# Patient Record
Sex: Female | Born: 1955
Health system: Southern US, Community
[De-identification: ages and names within clinical notes are randomized; demographics above are authoritative.]

## PROBLEM LIST (undated history)

## (undated) DIAGNOSIS — M51369 Other intervertebral disc degeneration, lumbar region without mention of lumbar back pain or lower extremity pain: Secondary | ICD-10-CM

## (undated) DIAGNOSIS — N809 Endometriosis, unspecified: Secondary | ICD-10-CM

## (undated) DIAGNOSIS — H9209 Otalgia, unspecified ear: Secondary | ICD-10-CM

## (undated) DIAGNOSIS — I493 Ventricular premature depolarization: Secondary | ICD-10-CM

## (undated) DIAGNOSIS — E119 Type 2 diabetes mellitus without complications: Secondary | ICD-10-CM

## (undated) DIAGNOSIS — R0989 Other specified symptoms and signs involving the circulatory and respiratory systems: Secondary | ICD-10-CM

## (undated) DIAGNOSIS — I10 Essential (primary) hypertension: Secondary | ICD-10-CM

## (undated) DIAGNOSIS — F419 Anxiety disorder, unspecified: Secondary | ICD-10-CM

## (undated) DIAGNOSIS — R002 Palpitations: Secondary | ICD-10-CM

## (undated) DIAGNOSIS — M5136 Other intervertebral disc degeneration, lumbar region: Secondary | ICD-10-CM

## (undated) HISTORY — PX: TUBAL LIGATION: SHX77

## (undated) HISTORY — PX: BREAST SURGERY: SHX581

## (undated) HISTORY — DX: Other specified symptoms and signs involving the circulatory and respiratory systems: R09.89

## (undated) HISTORY — DX: Endometriosis, unspecified: N80.9

## (undated) HISTORY — DX: Anxiety disorder, unspecified: F41.9

## (undated) HISTORY — DX: Ventricular premature depolarization: I49.3

## (undated) HISTORY — DX: Other intervertebral disc degeneration, lumbar region without mention of lumbar back pain or lower extremity pain: M51.369

## (undated) HISTORY — DX: Other intervertebral disc degeneration, lumbar region: M51.36

## (undated) HISTORY — PX: TOTAL KNEE ARTHROPLASTY: SHX125

## (undated) HISTORY — PX: REPLACEMENT TOTAL KNEE: SUR1224

## (undated) HISTORY — DX: Otalgia, unspecified ear: H92.09

## (undated) HISTORY — DX: Type 2 diabetes mellitus without complications: E11.9

## (undated) HISTORY — DX: Palpitations: R00.2

## (undated) HISTORY — DX: Essential (primary) hypertension: I10

---

## 2001-07-04 ENCOUNTER — Ambulatory Visit (HOSPITAL_COMMUNITY): Admission: RE | Admit: 2001-07-04 | Discharge: 2001-07-04 | Payer: Self-pay | Admitting: Family Medicine

## 2001-07-04 ENCOUNTER — Encounter: Payer: Self-pay | Admitting: Family Medicine

## 2001-07-14 ENCOUNTER — Ambulatory Visit (HOSPITAL_COMMUNITY): Admission: RE | Admit: 2001-07-14 | Discharge: 2001-07-14 | Payer: Self-pay | Admitting: Obstetrics and Gynecology

## 2001-07-14 ENCOUNTER — Encounter: Payer: Self-pay | Admitting: Obstetrics and Gynecology

## 2002-02-24 ENCOUNTER — Other Ambulatory Visit: Admission: RE | Admit: 2002-02-24 | Discharge: 2002-02-24 | Payer: Self-pay | Admitting: Obstetrics and Gynecology

## 2002-05-26 ENCOUNTER — Encounter: Payer: Self-pay | Admitting: Family Medicine

## 2002-05-26 ENCOUNTER — Ambulatory Visit (HOSPITAL_COMMUNITY): Admission: RE | Admit: 2002-05-26 | Discharge: 2002-05-26 | Payer: Self-pay | Admitting: Family Medicine

## 2002-07-05 ENCOUNTER — Ambulatory Visit (HOSPITAL_COMMUNITY): Admission: RE | Admit: 2002-07-05 | Discharge: 2002-07-05 | Payer: Self-pay | Admitting: Family Medicine

## 2002-07-05 ENCOUNTER — Encounter: Payer: Self-pay | Admitting: Family Medicine

## 2002-08-04 ENCOUNTER — Emergency Department (HOSPITAL_COMMUNITY): Admission: EM | Admit: 2002-08-04 | Discharge: 2002-08-04 | Payer: Self-pay | Admitting: Emergency Medicine

## 2003-02-01 ENCOUNTER — Emergency Department (HOSPITAL_COMMUNITY): Admission: EM | Admit: 2003-02-01 | Discharge: 2003-02-01 | Payer: Self-pay | Admitting: Internal Medicine

## 2003-03-13 ENCOUNTER — Emergency Department (HOSPITAL_COMMUNITY): Admission: EM | Admit: 2003-03-13 | Discharge: 2003-03-13 | Payer: Self-pay

## 2003-07-09 ENCOUNTER — Encounter: Payer: Self-pay | Admitting: Obstetrics and Gynecology

## 2003-07-09 ENCOUNTER — Ambulatory Visit (HOSPITAL_COMMUNITY): Admission: RE | Admit: 2003-07-09 | Discharge: 2003-07-09 | Payer: Self-pay | Admitting: Obstetrics and Gynecology

## 2004-05-29 ENCOUNTER — Ambulatory Visit (HOSPITAL_COMMUNITY): Admission: RE | Admit: 2004-05-29 | Discharge: 2004-05-29 | Payer: Self-pay | Admitting: Family Medicine

## 2004-12-03 ENCOUNTER — Ambulatory Visit (HOSPITAL_COMMUNITY): Admission: RE | Admit: 2004-12-03 | Discharge: 2004-12-03 | Payer: Self-pay | Admitting: Family Medicine

## 2005-05-17 ENCOUNTER — Emergency Department (HOSPITAL_COMMUNITY): Admission: EM | Admit: 2005-05-17 | Discharge: 2005-05-17 | Payer: Self-pay | Admitting: Emergency Medicine

## 2005-06-01 ENCOUNTER — Ambulatory Visit: Payer: Self-pay | Admitting: Orthopedic Surgery

## 2005-08-31 ENCOUNTER — Ambulatory Visit: Payer: Self-pay | Admitting: Orthopedic Surgery

## 2005-11-04 ENCOUNTER — Ambulatory Visit: Payer: Self-pay | Admitting: Orthopedic Surgery

## 2005-12-21 HISTORY — PX: CYST EXCISION: SHX5701

## 2006-01-18 ENCOUNTER — Ambulatory Visit: Payer: Self-pay | Admitting: Orthopedic Surgery

## 2006-01-25 ENCOUNTER — Ambulatory Visit (HOSPITAL_COMMUNITY): Admission: RE | Admit: 2006-01-25 | Discharge: 2006-01-25 | Payer: Self-pay | Admitting: Family Medicine

## 2006-01-28 ENCOUNTER — Ambulatory Visit: Payer: Self-pay | Admitting: Orthopedic Surgery

## 2006-02-19 ENCOUNTER — Encounter: Admission: RE | Admit: 2006-02-19 | Discharge: 2006-02-19 | Payer: Self-pay | Admitting: Orthopedic Surgery

## 2006-03-09 ENCOUNTER — Encounter: Admission: RE | Admit: 2006-03-09 | Discharge: 2006-03-09 | Payer: Self-pay | Admitting: Orthopedic Surgery

## 2006-06-17 ENCOUNTER — Encounter: Admission: RE | Admit: 2006-06-17 | Discharge: 2006-06-17 | Payer: Self-pay | Admitting: Orthopedic Surgery

## 2006-10-14 ENCOUNTER — Ambulatory Visit: Payer: Self-pay | Admitting: Orthopedic Surgery

## 2006-10-19 ENCOUNTER — Encounter (HOSPITAL_COMMUNITY): Admission: RE | Admit: 2006-10-19 | Discharge: 2006-11-18 | Payer: Self-pay | Admitting: Orthopedic Surgery

## 2006-10-27 ENCOUNTER — Ambulatory Visit: Payer: Self-pay | Admitting: Orthopedic Surgery

## 2006-11-19 ENCOUNTER — Encounter (HOSPITAL_COMMUNITY): Admission: RE | Admit: 2006-11-19 | Discharge: 2006-12-19 | Payer: Self-pay | Admitting: Orthopedic Surgery

## 2006-12-01 ENCOUNTER — Ambulatory Visit (HOSPITAL_COMMUNITY): Admission: RE | Admit: 2006-12-01 | Discharge: 2006-12-01 | Payer: Self-pay | Admitting: General Surgery

## 2006-12-01 ENCOUNTER — Encounter (INDEPENDENT_AMBULATORY_CARE_PROVIDER_SITE_OTHER): Payer: Self-pay | Admitting: *Deleted

## 2006-12-27 ENCOUNTER — Ambulatory Visit: Payer: Self-pay | Admitting: Orthopedic Surgery

## 2007-01-28 ENCOUNTER — Encounter: Admission: RE | Admit: 2007-01-28 | Discharge: 2007-01-28 | Payer: Self-pay | Admitting: Neurosurgery

## 2007-02-21 ENCOUNTER — Ambulatory Visit (HOSPITAL_COMMUNITY): Admission: RE | Admit: 2007-02-21 | Discharge: 2007-02-21 | Payer: Self-pay | Admitting: Family Medicine

## 2007-02-28 ENCOUNTER — Ambulatory Visit: Payer: Self-pay | Admitting: Orthopedic Surgery

## 2007-12-22 HISTORY — PX: COLONOSCOPY: SHX174

## 2007-12-22 HISTORY — PX: LUMBAR LAMINECTOMY: SHX95

## 2008-02-16 ENCOUNTER — Other Ambulatory Visit: Admission: RE | Admit: 2008-02-16 | Discharge: 2008-02-16 | Payer: Self-pay | Admitting: Obstetrics and Gynecology

## 2008-02-29 ENCOUNTER — Ambulatory Visit (HOSPITAL_COMMUNITY): Admission: RE | Admit: 2008-02-29 | Discharge: 2008-02-29 | Payer: Self-pay | Admitting: Family Medicine

## 2008-03-09 ENCOUNTER — Ambulatory Visit (HOSPITAL_COMMUNITY): Admission: RE | Admit: 2008-03-09 | Discharge: 2008-03-09 | Payer: Self-pay | Admitting: Family Medicine

## 2008-03-13 HISTORY — PX: CARDIOVASCULAR STRESS TEST: SHX262

## 2008-03-13 HISTORY — PX: DOPPLER ECHOCARDIOGRAPHY: SHX263

## 2008-03-22 ENCOUNTER — Ambulatory Visit (HOSPITAL_COMMUNITY): Admission: RE | Admit: 2008-03-22 | Discharge: 2008-03-22 | Payer: Self-pay | Admitting: Gastroenterology

## 2008-03-22 ENCOUNTER — Ambulatory Visit: Payer: Self-pay | Admitting: Gastroenterology

## 2008-04-25 ENCOUNTER — Encounter: Admission: RE | Admit: 2008-04-25 | Discharge: 2008-04-25 | Payer: Self-pay | Admitting: Neurosurgery

## 2008-04-30 ENCOUNTER — Encounter: Payer: Self-pay | Admitting: Orthopedic Surgery

## 2008-07-27 ENCOUNTER — Inpatient Hospital Stay (HOSPITAL_COMMUNITY): Admission: RE | Admit: 2008-07-27 | Discharge: 2008-07-31 | Payer: Self-pay | Admitting: Neurosurgery

## 2008-08-03 ENCOUNTER — Encounter: Payer: Self-pay | Admitting: Orthopedic Surgery

## 2008-12-21 HISTORY — PX: LAPAROSCOPIC CHOLECYSTECTOMY: SUR755

## 2009-08-10 ENCOUNTER — Emergency Department (HOSPITAL_COMMUNITY): Admission: EM | Admit: 2009-08-10 | Discharge: 2009-08-10 | Payer: Self-pay | Admitting: Emergency Medicine

## 2009-10-29 HISTORY — PX: OTHER SURGICAL HISTORY: SHX169

## 2009-11-07 ENCOUNTER — Emergency Department (HOSPITAL_COMMUNITY): Admission: EM | Admit: 2009-11-07 | Discharge: 2009-11-07 | Payer: Self-pay | Admitting: Emergency Medicine

## 2009-11-07 ENCOUNTER — Ambulatory Visit (HOSPITAL_COMMUNITY): Admission: RE | Admit: 2009-11-07 | Discharge: 2009-11-07 | Payer: Self-pay | Admitting: Emergency Medicine

## 2009-11-25 ENCOUNTER — Ambulatory Visit (HOSPITAL_COMMUNITY): Admission: RE | Admit: 2009-11-25 | Discharge: 2009-11-25 | Payer: Self-pay | Admitting: General Surgery

## 2010-07-28 ENCOUNTER — Other Ambulatory Visit: Admission: RE | Admit: 2010-07-28 | Discharge: 2010-07-28 | Payer: Self-pay | Admitting: Obstetrics & Gynecology

## 2010-07-29 ENCOUNTER — Ambulatory Visit (HOSPITAL_COMMUNITY): Admission: RE | Admit: 2010-07-29 | Discharge: 2010-07-29 | Payer: Self-pay | Admitting: Obstetrics & Gynecology

## 2010-10-04 DIAGNOSIS — R002 Palpitations: Secondary | ICD-10-CM

## 2010-10-04 HISTORY — DX: Palpitations: R00.2

## 2011-03-24 ENCOUNTER — Other Ambulatory Visit: Payer: Self-pay | Admitting: Obstetrics & Gynecology

## 2011-03-25 LAB — COMPREHENSIVE METABOLIC PANEL
ALT: 18 U/L (ref 0–35)
AST: 29 U/L (ref 0–37)
Albumin: 3.8 g/dL (ref 3.5–5.2)
CO2: 32 mEq/L (ref 19–32)
Creatinine, Ser: 0.88 mg/dL (ref 0.4–1.2)
Sodium: 141 mEq/L (ref 135–145)
Total Bilirubin: 0.4 mg/dL (ref 0.3–1.2)
Total Protein: 8 g/dL (ref 6.0–8.3)

## 2011-03-25 LAB — CBC
Hemoglobin: 11.8 g/dL — ABNORMAL LOW (ref 12.0–15.0)
MCV: 81.8 fL (ref 78.0–100.0)
Platelets: 304 10*3/uL (ref 150–400)
RBC: 4.44 MIL/uL (ref 3.87–5.11)
WBC: 7.7 10*3/uL (ref 4.0–10.5)

## 2011-03-25 LAB — DIFFERENTIAL
Basophils Relative: 1 % (ref 0–1)
Eosinophils Absolute: 0.2 10*3/uL (ref 0.0–0.7)
Monocytes Absolute: 0.5 10*3/uL (ref 0.1–1.0)

## 2011-03-25 LAB — LIPASE, BLOOD: Lipase: 14 U/L (ref 11–59)

## 2011-05-05 NOTE — Op Note (Signed)
Megan Oneal, Megan Oneal                  ACCOUNT NO.:  [id number]   MEDICAL RECORD NO.:  [id number]          PATIENT TYPE:  AMB   LOCATION:  DAY                           FACILITY:  [facility]   PHYSICIAN:  Kassie Mends, M.D.      DATE OF BIRTH:  12/03/1956   DATE OF PROCEDURE:  03/22/2008  DATE OF DISCHARGE:                               OPERATIVE REPORT   PROCEDURE:  Colonoscopy.   INDICATION FOR EXAM:  Megan Oneal is a 55 year old female who presents for  average risk colon cancer screening.   FINDINGS:  Small internal hemorrhoids.  Otherwise no polyps, masses,  inflammatory changes, diverticula, or AVMs.   RECOMMENDATIONS:  1. She should follow high fiber diet. She is given a handout on high-      fiber diet and hemorrhoids.  2. Screening colonoscopy in 10 years.   MEDICATIONS:  1. Demerol 100 mg IV.  2. Versed 6 mg IV.   PROCEDURE TECHNIQUE:  Physical exam was performed.  An informed consent  was obtained from the patient after explaining the benefits, risks, and  alternatives to the procedure.  The patient was connected to the monitor  and placed in the left lateral position.  Continuous oxygen was provided  by nasal cannula, IV medicine administered through an indwelling  cannula.  After administration of sedation and rectal exam, the  patient's rectum was intubated; and the scope was advanced under direct  visualization to the cecum.  The scope was removed slowly by carefully  examining the color, texture, anatomy, and integrity of the mucosa on  the way out.  The patient was recovered in endoscopy, discharged home in  satisfactory condition.      Kassie Mends, M.D.  Electronically Signed     SM/MEDQ  D:  03/22/2008  T:  03/22/2008  Job:  [id number]   cc:   Lazaro Arms, M.D.  Fax: [phone]

## 2011-05-05 NOTE — Op Note (Signed)
NAMEJALIANA, Megan Oneal                  ACCOUNT NO.:  [id number]   MEDICAL RECORD NO.:  [id number]          PATIENT TYPE:  INP   LOCATION:  3002                         FACILITY:  [facility]   PHYSICIAN:  Danae Orleans. Venetia Maxon, M.D.  DATE OF BIRTH:  Jan 21, 1956   DATE OF PROCEDURE:  07/27/2008  DATE OF DISCHARGE:                               OPERATIVE REPORT   PREOPERATIVE DIAGNOSES:  Herniated lumbar disk with spondylolisthesis,  degenerative disk disease, stenosis, and lumbar radiculopathy at L4-L5  and L5-S1 levels.   POSTOPERATIVE DIAGNOSES:  Herniated lumbar disk with spondylolisthesis,  degenerative disk disease, stenosis, and lumbar radiculopathy at L4-L5  and L5-S1 levels.   PROCEDURES:  L4-L5 and L5-S1 laminectomy with diskectomy; transforaminal  lumbar interbody fusion with PEEK interbody cages, BMP morselized, bone  autograft, and Fortoss; pedicle screw fixation L4 through S1 levels  bilaterally; and posterolateral arthrodesis.   SURGEON:  Danae Orleans. Venetia Maxon, MD   ASSISTANT:  1. Georgiann Cocker, RN  2. Payton Doughty, MD   ANESTHESIA:  General endotracheal anesthesia.   ESTIMATED BLOOD LOSS:  200 mL.   COMPLICATIONS:  None.   DISPOSITION:  Recovery.   INDICATIONS:  Megan Oneal is a 55 year old morbidly obese woman with a  mobile spondylolisthesis of L4 and L5 with a herniated disk at L4-L5  eccentric to the left and with significant disk degeneration at L5-S1.  It was elected to take her to surgery for decompression and fusion at  these affected levels.   PROCEDURE:  Megan Oneal was brought to the operating room.  Following  satisfactory uncomplicated induction of general endotracheal anesthesia  and placement of intravenous lines and Foley catheter, the patient was  placed in prone position on the [place] table.  Soft tissue and bony  prominences were padded appropriately.  Her low back was prepped and  draped in the usual sterile fashion.  The area of planned incision was  infiltrated with 0.25% Marcaine and 0.5% lidocaine with 1:100,000  epinephrine.  Incision was made in the midline and carried through  approximately 6 inches of adipose tissue.  The lumbodorsal fascia was  incised bilaterally.  Subperiosteal dissection was performed exposing  the L4 and L5 transverse processes bilaterally and sacral alae.  Self-  retaining retractors were placed and the intraoperative x-ray confirmed  correct orientation.  Subsequently, hemilaminectomy of L4 and of L5 was  performed using a high-speed drill and Kerrison rongeurs on the left  side of midline.  The facet joints were markedly incompetent at L4-L5.  Laminar spreader was placed at L4-L5 on the right.  The interspace was  incised with a #15 blade after protecting the neural elements with  D'Errico nerve root retractor and endplates were stripped of residual  disk material using variety of endplate preparation and curettes, and  after trial sizing, it was elected to use a 9-mm banana-shaped TLIF  cage.  This was packed with portion of a small kit of BMP along with  Fortoss.  Additional BMP and Fortoss were placed deep to the cage with  bone autograft.  The cage was then  inserted and countersunk  appropriately.  Additional bone autograft was placed overlying this cage  and tamped into position.  A similar decompression was performed at the  L5-S1 level.  This was markedly degenerated.  The disk was highly  degenerated.  The endplates were stripped of residual disk material, and  after trial sizing, it was elected to use a 7-mm TLIF cage.  This was  inserted and tamped into position after packing the interspace with BMP  and bone autograft and Fortoss, and additionally, cage had been packed  with Fortoss and BMP.  We attempted to rotate the cage into a lateral  position, but we would not move into this position; however, that it was  felt to be well positioned in its good alignment with significant  restoration of  disk space height at this level.  Subsequently, pedicle  screws were then placed bilaterally using 45 x 6.5 mm sacral screws, 45  x 6.5 mm L5 screws, and 50 x 6.5 mm screws at L4.  All screws had  excellent purchase.  The positioning was confirmed on AP and lateral  fluoroscopy.  No evidence of any bony cutouts.  A 60-mm preloaded rods  were then placed and locked down in situ.  In the left posterolateral  region, remaining BMP, bone autograft, and a 10 mL of Actifuse were  placed going through remaining Fortoss and this was taped into position.  After screws were locked down, the fascia was closed with one Vicryl  sutures, subcutaneous tissues were approximated with 2-0 Vicryl  interrupted inverted sutures, and skin tissues were approximated with  interrupted 3-0 Vicryl subcutaneous stitch.  Wound was dressed with  Benzoin, Steri-Strips, Telfa gauze, and tape.  The patient was extubated  in the operating room and taken to the recovery in stable satisfactory  condition having tolerated the procedure well.  Counts were correct at  the end of the case.      Danae Orleans. Venetia Maxon, M.D.  Electronically Signed     JDS/MEDQ  D:  07/27/2008  T:  07/28/2008  Job:  [id number]

## 2011-05-08 NOTE — Op Note (Signed)
Megan Oneal, Megan Oneal                  ACCOUNT NO.:  [id number]   MEDICAL RECORD NO.:  [id number]          PATIENT TYPE:  AMB   LOCATION:  DAY                           FACILITY:  [facility]   PHYSICIAN:  Dalia Heading, M.D.  DATE OF BIRTH:  July 19, 1956   DATE OF PROCEDURE:  12/01/2006  DATE OF DISCHARGE:                               OPERATIVE REPORT   PREOPERATIVE DIAGNOSIS:  Follicular cyst, scalp.   POSTOPERATIVE DIAGNOSIS:  Follicular cyst, scalp.   PROCEDURE:  Excision of 1.5 cm cyst, scalp.   SURGEON:  Dr. Franky Macho   ANESTHESIA:  MAC.   INDICATIONS:  The patient is a 55 year old black female who presents  with a recurrent follicular cyst on the right posterior aspect of the  scalp.  The risks and benefits of the procedure were fully explained to  the patient, gave informed consent.   PROCEDURE NOTE:  The patient was placed in left lateral decubitus  position.  A small area at the neck line of the scalp on the right side  was shaved and prepped in the usual sterile technique with Betadine.  Surgical site confirmation was performed.  1% Xylocaine with epinephrine  was used local anesthesia.   An elliptical incision was made around the cyst.  This was taken down to  the subcutaneous tissue and the cyst was excised without difficulty.  Bleeding was controlled using Bovie electrocautery.  The specimen sent  to pathology for further examination.  The skin was reapproximated using  4-0 nylon interrupted sutures.  Neosporin ointment was then applied.   All tape and needle counts correct at the end of procedure.  The patient  was transferred to day surgery in stable condition.  Complications none.   SPECIMEN:  Sebaceous cyst, scalp.   BLOOD LOSS:  None      Dalia Heading, M.D.  Electronically Signed     MAJ/MEDQ  D:  12/01/2006  T:  12/01/2006  Job:  [id number]   cc:   Patrica Duel, M.D.  Fax: [phone]

## 2011-05-08 NOTE — Discharge Summary (Signed)
NAMEJASHAWNA, Megan Oneal                  ACCOUNT NO.:  [id number]   MEDICAL RECORD NO.:  [id number]          PATIENT TYPE:  INP   LOCATION:  3002                         FACILITY:  [facility]   PHYSICIAN:  Danae Orleans. Venetia Maxon, M.D.  DATE OF BIRTH:  December 18, 1956   DATE OF ADMISSION:  07/27/2008  DATE OF DISCHARGE:  07/31/2008                               DISCHARGE SUMMARY   REASON FOR ADMISSION:  1. Lumbar disk displacement.  2. Spondylolisthesis.  3. Lumbar disk degeneration.  4. Spinal stenosis.  5. Lumbosacral neuritis, NOS.  6. Morbid obesity.  7. Hypertension, NOS.  8. Esophageal reflux.  9. Lumbosacral spondylosis.   FINAL DIAGNOSES:  1. Lumbar disk displacement.  2. Spondylolisthesis.  3. Lumbar disk degeneration.  4. Spinal stenosis.  5. Lumbosacral neuritis, not otherwise specified.  6. Morbid obesity.  7. Hypertension, NOS.  8. Esophageal reflux.  9. Lumbosacral spondylosis.   HISTORY OF ILLNESS AND HOSPITAL COURSE:  Megan Oneal is a 55 year old  woman who is admitted to this hospital on the same day of procedure and  underwent decompression and fusion L4 through S1 levels.  Postoperatively, she did well.  It was gradually mobilized into back  brace and had some left leg pain, which subsequently improved.  She was  doing well on August 31, 2008, and discharged home with a rolling  walker.  Instructed to set up for home physical therapy and occupational  therapy with Percocet, Lyrica, and Flexeril.  Instructed to follow up in  the office in 3 weeks for postoperative visit.      Danae Orleans. Venetia Maxon, M.D.  Electronically Signed     JDS/MEDQ  D:  09/13/2008  T:  09/14/2008  Job:  [id number]

## 2011-05-08 NOTE — H&P (Signed)
Megan Oneal, Megan Oneal                  ACCOUNT NO.:  [id number]   MEDICAL RECORD NO.:  [id number]          PATIENT TYPE:  AMB   LOCATION:                                FACILITY:  [facility]   PHYSICIAN:  Dalia Heading, M.D.  DATE OF BIRTH:  [birth year]   DATE OF ADMISSION:  DATE OF DISCHARGE:  LH                              HISTORY & PHYSICAL   CHIEF COMPLAINT:  Follicular cyst, scalp.   HISTORY OF PRESENT ILLNESS:  The patient is a 55 year old black female  who is referred for evaluation and treatment of a cyst on her scalp.  It  has been present for some time, but has increased in size and is causing  her discomfort.  No drainage has been noted.  Antibiotics have not been  helpful.   PAST MEDICAL HISTORY:  Includes hypertension.   PAST SURGICAL HISTORY:  Unremarkable.   CURRENT MEDICATIONS:  1. Diclofenac.  2. Vicodin.  3. Quineretic.  4. Metoprolol.  5. Clindamycin   </ALLERGIES/  SULFA.Marland Kitchen   REVIEW OF SYSTEMS:  Noncontributory.   PHYSICAL EXAMINATION:  GENERAL:  The patient is a well-developed, well-  nourished black female in no acute distress.  HEENT: Examination reveals a 1.5 cm tender sebaceous cyst along the  hairline, right posterior.  LUNGS:  Clear to auscultation with equal breath sounds bilaterally.  HEART:  Examination reveals regular rate and rhythm without S3, S4,  murmurs.   IMPRESSION:  Follicular cyst, scalp.   PLAN:  The patient is scheduled for excision of the cyst, scalp on  December 01, 2006.  The risks and benefits of the procedure including  bleeding, infection and recurrence of the cyst were fully explained to  the patient, who gave informed consent.      Dalia Heading, M.D.  Electronically Signed     MAJ/MEDQ  D:  11/25/2006  T:  11/25/2006  Job:  [id number]   cc:   Patrica Duel, M.D.  Fax: [phone]

## 2011-05-13 ENCOUNTER — Ambulatory Visit (INDEPENDENT_AMBULATORY_CARE_PROVIDER_SITE_OTHER): Payer: Medicare Other | Admitting: Internal Medicine

## 2011-05-13 DIAGNOSIS — K219 Gastro-esophageal reflux disease without esophagitis: Secondary | ICD-10-CM

## 2011-06-01 NOTE — Consult Note (Signed)
NAMEKEWANDA, POLAND                  ACCOUNT NO.:  [id number]  MEDICAL RECORD NO.:  [id number]          PATIENT TYPE:  LOCATION:                                 FACILITY:  PHYSICIAN:  Lionel December, M.D.    DATE OF BIRTH:  1956/01/22  DATE OF CONSULTATION:  05/13/2011 DATE OF DISCHARGE:                                CONSULTATION   REASON FOR CONSULTATION:  GERD.  HISTORY OF PRESENT ILLNESS:  Megan Oneal is a 55 year old female presenting today with complaints of heartburn and sometimes acid will bubble up into her esophagus.  She states when she drinks sodas and spicy foods she will have heartburn.  She did have a prescription for omeprazole, but  says that the medication gave her headache so she stopped it. She is presently taking Zantac on a p.r.n. basis and she says it works.  She says the symptoms became worse after her gallbladder was removed in 2010.  She says she has had been on a GERD diet, but does occasionally eat some spicy foods. She says oatmeal does not bother her.  Acid reflux occurs about every 2- 3 days.  Her appetite is good.  She has one bowel movement a day.  There has been no weight loss.  No dysphagia.  No melena or bright red rectal bleeding.  HOME MEDICATIONS:  Include 1. Anacin p.r.n. 2. Verapamil 180 mg b.i.d. 3. Furosemide 20 mg as needed. 4. Klor-Con 10 mEq as needed. 5. Vitamin D 50,000 units once a week. 6. Tramadol 50 mg 1-2 as needed. 7. Xanax 0.5 as needed. 8. Tums as needed. 9. Zantac 150 mg p.r.n. 10.Fluticasone nasal spray 1 to each nostril daily. 11.Progesterone 1 daily.  ALLERGIES:  She is allergic to SULFA.  PAST SURGICAL HISTORY:  She had a cholecystectomy in 2010 by Dr. Lovell Sheehan for multiple gallstones.  She had back surgery in 2009 in [place]. She had a tubal ligation at age 12.  She had a left breast surgery at age 80 for benign tumor.  She also recently had a uterine biopsy which was normal by Dr. Trudee Kuster.  MEDICAL HISTORY:   Includes hypertension.  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  She recently has a diagnosis of vitamin D deficiency.  FAMILY HISTORY:  Her mother is alive with hypertension, diabetes x34 years and back surgery.  Her father is deceased, cause unknown.  She has one sister in good health that is a borderline diabetic.  One brother in good health with diabetes.  SOCIAL:  She is divorced.  She is retired.  She quit smoking 6 years ago, and she does not drink or do drugs.  She has 3 children, 2 children have all back problems and one was in good health.  SUBJECTIVE:  VITAL SIGNS:  Her weight is 282.7, her height is 5 feet 8 inches, her temperature is 98.9, her blood pressure is 132/66, her pulse is 76. HEENT:  She has natural teeth.  Her oral mucosa is moist.  There are no lesions.  Her conjunctivae is pink.  Her sclerae are anicteric.  Her thyroid is normal.  There is no cervical lymphadenopathy. LUNGS:  Clear. HEART:  Regular rate and rhythm. ABDOMEN:  Soft.  Bowel sounds are positive.  No masses. EXTREMITIES:  She may have 1+ edema to her lower extremities, but it is nonpitting.  ASSESSMENT:  [name] is a 55 year old female presenting with complaints of gastroesophageal reflux disease.  She has, however, been taking her Zantac on a p.r.n. basis.  RECOMMENDATIONS:  I would like to give her samples of Dexilant 5 boxes #25.  She will take them 30 minutes before breakfast each day.  She will keep the head of her bed elevated.  She will stay on her GERD diet.  She will call with a progress report in 2 weeks.  She was advised she needed to lose weight.    ______________________________ Dorene Ar, NP   ______________________________ Lionel December, M.D.    TS/MEDQ  D:  05/13/2011  T:  05/14/2011  Job:  [id number]  cc:   Dr. Phillips Odor  Electronically Signed by Dorene Ar PA on 05/21/2011 01:47:28 PM Electronically Signed by Lionel December M.D. on  06/01/2011 02:37:47 PM

## 2011-07-15 ENCOUNTER — Other Ambulatory Visit (HOSPITAL_COMMUNITY): Payer: Self-pay | Admitting: Family Medicine

## 2011-07-15 DIAGNOSIS — Z139 Encounter for screening, unspecified: Secondary | ICD-10-CM

## 2011-08-03 ENCOUNTER — Ambulatory Visit (HOSPITAL_COMMUNITY)
Admission: RE | Admit: 2011-08-03 | Discharge: 2011-08-03 | Disposition: A | Discharge status: Home / Self Care | Payer: Medicare Other | Source: Ambulatory Visit | Attending: Family Medicine | Admitting: Family Medicine

## 2011-08-03 DIAGNOSIS — Z139 Encounter for screening, unspecified: Secondary | ICD-10-CM

## 2011-08-03 DIAGNOSIS — Z1231 Encounter for screening mammogram for malignant neoplasm of breast: Secondary | ICD-10-CM | POA: Insufficient documentation

## 2011-09-04 ENCOUNTER — Other Ambulatory Visit (HOSPITAL_COMMUNITY)
Admission: RE | Admit: 2011-09-04 | Discharge: 2011-09-04 | Disposition: A | Discharge status: Home / Self Care | Payer: Medicare Other | Source: Ambulatory Visit | Attending: Obstetrics & Gynecology | Admitting: Obstetrics & Gynecology

## 2011-09-04 ENCOUNTER — Other Ambulatory Visit: Payer: Self-pay | Admitting: Obstetrics & Gynecology

## 2011-09-04 DIAGNOSIS — Z124 Encounter for screening for malignant neoplasm of cervix: Secondary | ICD-10-CM | POA: Insufficient documentation

## 2011-09-18 LAB — CBC
Hemoglobin: 11.9 — ABNORMAL LOW
MCHC: 32.1
MCV: 82.1
Platelets: 240
RBC: 4.51
RDW: 15.7 — ABNORMAL HIGH
WBC: 7.7

## 2011-09-18 LAB — BASIC METABOLIC PANEL
CO2: 26
Chloride: 105
GFR calc Af Amer: >60
Potassium: 3.3 — ABNORMAL LOW
Sodium: 140

## 2011-09-18 LAB — POTASSIUM: Potassium: 3.7

## 2011-09-18 LAB — ABO/RH: ABO/RH(D): O POS

## 2011-12-25 DIAGNOSIS — I10 Essential (primary) hypertension: Secondary | ICD-10-CM | POA: Diagnosis not present

## 2011-12-25 DIAGNOSIS — J019 Acute sinusitis, unspecified: Secondary | ICD-10-CM | POA: Diagnosis not present

## 2011-12-25 DIAGNOSIS — Z6841 Body Mass Index (BMI) 40.0 and over, adult: Secondary | ICD-10-CM | POA: Diagnosis not present

## 2011-12-25 DIAGNOSIS — N390 Urinary tract infection, site not specified: Secondary | ICD-10-CM | POA: Diagnosis not present

## 2012-02-22 ENCOUNTER — Other Ambulatory Visit (INDEPENDENT_AMBULATORY_CARE_PROVIDER_SITE_OTHER): Payer: Self-pay | Admitting: *Deleted

## 2012-02-22 NOTE — Telephone Encounter (Signed)
Insurance will pay for this medication.

## 2012-02-22 NOTE — Telephone Encounter (Signed)
[facility] [place] has requested a PA on Dexilant 60 mg capsuel, take 1 capsule by mouth 1 time a day 30 minutes before breadfast. PA or switch medicine to a generic. [facility] return phone number is [phone].

## 2012-04-27 DIAGNOSIS — M19019 Primary osteoarthritis, unspecified shoulder: Secondary | ICD-10-CM | POA: Diagnosis not present

## 2012-05-04 DIAGNOSIS — R002 Palpitations: Secondary | ICD-10-CM | POA: Diagnosis not present

## 2012-05-04 DIAGNOSIS — F411 Generalized anxiety disorder: Secondary | ICD-10-CM | POA: Diagnosis not present

## 2012-05-04 DIAGNOSIS — E669 Obesity, unspecified: Secondary | ICD-10-CM | POA: Diagnosis not present

## 2012-05-18 DIAGNOSIS — M7530 Calcific tendinitis of unspecified shoulder: Secondary | ICD-10-CM | POA: Diagnosis not present

## 2012-05-18 DIAGNOSIS — M25519 Pain in unspecified shoulder: Secondary | ICD-10-CM | POA: Diagnosis not present

## 2012-05-20 DIAGNOSIS — M25519 Pain in unspecified shoulder: Secondary | ICD-10-CM | POA: Diagnosis not present

## 2012-05-20 DIAGNOSIS — M7530 Calcific tendinitis of unspecified shoulder: Secondary | ICD-10-CM | POA: Diagnosis not present

## 2012-05-25 ENCOUNTER — Telehealth (INDEPENDENT_AMBULATORY_CARE_PROVIDER_SITE_OTHER): Payer: Self-pay | Admitting: *Deleted

## 2012-05-25 DIAGNOSIS — M25519 Pain in unspecified shoulder: Secondary | ICD-10-CM | POA: Diagnosis not present

## 2012-05-25 DIAGNOSIS — M7530 Calcific tendinitis of unspecified shoulder: Secondary | ICD-10-CM | POA: Diagnosis not present

## 2012-05-25 NOTE — Telephone Encounter (Signed)
[facility] has requested a refill on Dexilant DR 60 mg, take one capsule by mouth 1 time a day 30 minutes before breakfast.

## 2012-05-27 NOTE — Telephone Encounter (Signed)
This has already been addressed

## 2012-06-27 DIAGNOSIS — E785 Hyperlipidemia, unspecified: Secondary | ICD-10-CM | POA: Diagnosis not present

## 2012-06-27 DIAGNOSIS — E669 Obesity, unspecified: Secondary | ICD-10-CM | POA: Diagnosis not present

## 2012-06-27 DIAGNOSIS — I10 Essential (primary) hypertension: Secondary | ICD-10-CM | POA: Diagnosis not present

## 2012-06-27 DIAGNOSIS — Z6841 Body Mass Index (BMI) 40.0 and over, adult: Secondary | ICD-10-CM | POA: Diagnosis not present

## 2012-06-27 DIAGNOSIS — E559 Vitamin D deficiency, unspecified: Secondary | ICD-10-CM | POA: Diagnosis not present

## 2012-09-21 ENCOUNTER — Other Ambulatory Visit: Payer: Self-pay | Admitting: Obstetrics & Gynecology

## 2012-09-21 DIAGNOSIS — IMO0001 Reserved for inherently not codable concepts without codable children: Secondary | ICD-10-CM

## 2012-09-22 ENCOUNTER — Other Ambulatory Visit (HOSPITAL_COMMUNITY)
Admission: RE | Admit: 2012-09-22 | Discharge: 2012-09-22 | Disposition: A | Discharge status: Home / Self Care | Payer: Medicare Other | Source: Ambulatory Visit | Attending: Obstetrics & Gynecology | Admitting: Obstetrics & Gynecology

## 2012-09-22 ENCOUNTER — Other Ambulatory Visit: Payer: Self-pay | Admitting: Obstetrics & Gynecology

## 2012-09-22 DIAGNOSIS — Z124 Encounter for screening for malignant neoplasm of cervix: Secondary | ICD-10-CM | POA: Insufficient documentation

## 2012-09-22 DIAGNOSIS — Z3202 Encounter for pregnancy test, result negative: Secondary | ICD-10-CM | POA: Diagnosis not present

## 2012-09-26 ENCOUNTER — Ambulatory Visit (HOSPITAL_COMMUNITY): Payer: Medicare Other

## 2012-10-03 DIAGNOSIS — N8500 Endometrial hyperplasia, unspecified: Secondary | ICD-10-CM | POA: Diagnosis not present

## 2012-10-03 DIAGNOSIS — R9389 Abnormal findings on diagnostic imaging of other specified body structures: Secondary | ICD-10-CM | POA: Diagnosis not present

## 2012-10-04 ENCOUNTER — Ambulatory Visit (HOSPITAL_COMMUNITY)
Admission: RE | Admit: 2012-10-04 | Discharge: 2012-10-04 | Disposition: A | Discharge status: Home / Self Care | Payer: Medicare Other | Source: Ambulatory Visit | Attending: Obstetrics & Gynecology | Admitting: Obstetrics & Gynecology

## 2012-10-04 DIAGNOSIS — Z1231 Encounter for screening mammogram for malignant neoplasm of breast: Secondary | ICD-10-CM | POA: Insufficient documentation

## 2012-10-04 DIAGNOSIS — IMO0001 Reserved for inherently not codable concepts without codable children: Secondary | ICD-10-CM

## 2012-11-20 ENCOUNTER — Other Ambulatory Visit (INDEPENDENT_AMBULATORY_CARE_PROVIDER_SITE_OTHER): Payer: Self-pay | Admitting: Internal Medicine

## 2012-11-20 DIAGNOSIS — K219 Gastro-esophageal reflux disease without esophagitis: Secondary | ICD-10-CM

## 2012-11-24 DIAGNOSIS — Z6841 Body Mass Index (BMI) 40.0 and over, adult: Secondary | ICD-10-CM | POA: Diagnosis not present

## 2012-11-24 DIAGNOSIS — S335XXA Sprain of ligaments of lumbar spine, initial encounter: Secondary | ICD-10-CM | POA: Diagnosis not present

## 2012-11-24 DIAGNOSIS — H9209 Otalgia, unspecified ear: Secondary | ICD-10-CM | POA: Diagnosis not present

## 2013-01-21 DIAGNOSIS — R0989 Other specified symptoms and signs involving the circulatory and respiratory systems: Secondary | ICD-10-CM

## 2013-01-21 HISTORY — DX: Other specified symptoms and signs involving the circulatory and respiratory systems: R09.89

## 2013-01-25 DIAGNOSIS — IMO0002 Reserved for concepts with insufficient information to code with codable children: Secondary | ICD-10-CM | POA: Diagnosis not present

## 2013-01-25 DIAGNOSIS — M1710 Unilateral primary osteoarthritis, unspecified knee: Secondary | ICD-10-CM | POA: Diagnosis not present

## 2013-01-26 ENCOUNTER — Telehealth (INDEPENDENT_AMBULATORY_CARE_PROVIDER_SITE_OTHER): Payer: Self-pay | Admitting: *Deleted

## 2013-01-26 DIAGNOSIS — H9209 Otalgia, unspecified ear: Secondary | ICD-10-CM | POA: Diagnosis not present

## 2013-01-26 DIAGNOSIS — H60399 Other infective otitis externa, unspecified ear: Secondary | ICD-10-CM | POA: Diagnosis not present

## 2013-01-26 DIAGNOSIS — Z6841 Body Mass Index (BMI) 40.0 and over, adult: Secondary | ICD-10-CM | POA: Diagnosis not present

## 2013-01-26 DIAGNOSIS — H6120 Impacted cerumen, unspecified ear: Secondary | ICD-10-CM | POA: Diagnosis not present

## 2013-01-26 DIAGNOSIS — I10 Essential (primary) hypertension: Secondary | ICD-10-CM | POA: Diagnosis not present

## 2013-01-26 NOTE — Telephone Encounter (Signed)
Rec'd a prior auth for Dexilant 60 mg take one capsule 30 minutes before breakffast #30 [id number] refills. Called [employer].. Medication was approved from today's date through 12-20-13. [facility] was notified

## 2013-01-30 ENCOUNTER — Ambulatory Visit: Payer: Self-pay | Admitting: Orthopedic Surgery

## 2013-01-30 DIAGNOSIS — M1710 Unilateral primary osteoarthritis, unspecified knee: Secondary | ICD-10-CM | POA: Diagnosis not present

## 2013-01-30 DIAGNOSIS — IMO0002 Reserved for concepts with insufficient information to code with codable children: Secondary | ICD-10-CM | POA: Diagnosis not present

## 2013-02-02 ENCOUNTER — Ambulatory Visit: Payer: Medicare Other | Admitting: Cardiology

## 2013-02-15 ENCOUNTER — Encounter: Payer: Self-pay | Admitting: *Deleted

## 2013-02-16 ENCOUNTER — Encounter: Payer: Self-pay | Admitting: Cardiology

## 2013-02-16 ENCOUNTER — Ambulatory Visit (INDEPENDENT_AMBULATORY_CARE_PROVIDER_SITE_OTHER): Payer: Medicare Other | Admitting: Cardiology

## 2013-02-16 VITALS — BP 150/87 | HR 97 | Ht 68.0 in | Wt 306.0 lb

## 2013-02-16 DIAGNOSIS — N809 Endometriosis, unspecified: Secondary | ICD-10-CM

## 2013-02-16 DIAGNOSIS — M5137 Other intervertebral disc degeneration, lumbosacral region: Secondary | ICD-10-CM

## 2013-02-16 DIAGNOSIS — I10 Essential (primary) hypertension: Secondary | ICD-10-CM | POA: Diagnosis not present

## 2013-02-16 DIAGNOSIS — M51369 Other intervertebral disc degeneration, lumbar region without mention of lumbar back pain or lower extremity pain: Secondary | ICD-10-CM

## 2013-02-16 DIAGNOSIS — R0989 Other specified symptoms and signs involving the circulatory and respiratory systems: Secondary | ICD-10-CM | POA: Diagnosis not present

## 2013-02-16 DIAGNOSIS — M5136 Other intervertebral disc degeneration, lumbar region: Secondary | ICD-10-CM

## 2013-02-16 DIAGNOSIS — I4891 Unspecified atrial fibrillation: Secondary | ICD-10-CM

## 2013-02-16 NOTE — Progress Notes (Deleted)
Name: Megan Oneal    DOB: Oct 30, 1956  Age: 57 y.o.  MR#: [id number]       PCP:  Colette Ribas, MD      Insurance: Payor: MEDICARE  Plan: MEDICARE PART A AND B  Product Type: *No Product type*    CC:   No chief complaint on file.  MEDICATIONS REVIEWED OVER PHONE DR Jen Mow AT [facility] IN [place] - TOTAL KNEE (L)  VS Filed Vitals:   02/16/13 1305  BP: 150/87  Pulse: 97  Height: 5\' 8"  (1.727 m)  Weight: 306 lb (138.801 kg)  SpO2: 97%    Weights Current Weight  02/16/13 306 lb (138.801 kg)    Blood Pressure  BP Readings from Last 3 Encounters:  02/16/13 150/87     Admit date:  (Not on file) Last encounter with RMR:  02/02/2013   Allergy Sulfa antibiotics  Current Outpatient Prescriptions  Medication Sig Dispense Refill  . ALPRAZolam (XANAX) 0.5 MG tablet Take 0.5 mg by mouth 3 (three) times daily as needed for sleep.      . calcium-vitamin D (OSCAL) 250-125 MG-UNIT per tablet Take 1 tablet by mouth once a week.      . DEXILANT 60 MG capsule TAKE ONE CAPSULE BY MOUTH 1 TIME A DAY 30 MINUTES BEFORE BREAKFAST  30 capsule  5  . fluticasone (FLONASE) 50 MCG/ACT nasal spray Place 2 sprays into the nose daily.      . furosemide (LASIX) 40 MG tablet Take 40 mg by mouth daily.      Marland Kitchen HYDROcodone-acetaminophen (VICODIN) 5-500 MG per tablet Take 1 tablet by mouth every 6 (six) hours as needed for pain.      Marland Kitchen losartan (COZAAR) 100 MG tablet Take 100 mg by mouth daily.      . medroxyPROGESTERone (PROVERA) 2.5 MG tablet Take 2.5 mg by mouth daily.      . meloxicam (MOBIC) 15 MG tablet Take 15 mg by mouth daily as needed for pain.      Marland Kitchen NEOMYCIN-POLYMYXIN-HC, OTIC, (CORTISPORIN) 1 % SOLN every 6 (six) hours.      . potassium chloride (K-DUR,KLOR-CON) 10 MEQ tablet Take 10 mEq by mouth daily.      . traMADol (ULTRAM) 50 MG tablet Take 50 mg by mouth every 6 (six) hours as needed for pain.      . verapamil (CALAN-SR) 180 MG CR tablet Take 180 mg by mouth 2 (two) times daily.        No current facility-administered medications for this visit.    Discontinued Meds:   There are no discontinued medications.  Patient Active Problem List  Diagnosis  . Hypertension    LABS    Component Value Date/Time   NA 141 11/07/2009 0455   NA 140 07/23/2008 1608   K 3.8 11/07/2009 0455   K 3.7 07/27/2008 0604   K 3.3* 07/23/2008 1608   CL 103 11/07/2009 0455   CL 105 07/23/2008 1608   CO2 32 11/07/2009 0455   CO2 26 07/23/2008 1608   GLUCOSE 113* 11/07/2009 0455   GLUCOSE 95 07/23/2008 1608   BUN 11 11/07/2009 0455   BUN 9 07/23/2008 1608   CREATININE 0.88 11/07/2009 0455   CREATININE 0.79 07/23/2008 1608   CALCIUM 9.7 11/07/2009 0455   CALCIUM 9.4 07/23/2008 1608   GFRNONAA >60 11/07/2009 0455   GFRNONAA >60 07/23/2008 1608   GFRAA  Value: >60        The eGFR has been calculated  using the MDRD equation. This calculation has not been validated in all clinical situations. eGFR's persistently <60 mL/min signify possible Chronic Kidney Disease. 11/07/2009 0455   GFRAA  Value: >60        The eGFR has been calculated using the MDRD equation. This calculation has not been validated in all clinical 07/23/2008 1608   CMP     Component Value Date/Time   NA 141 11/07/2009 0455   K 3.8 11/07/2009 0455   CL 103 11/07/2009 0455   CO2 32 11/07/2009 0455   GLUCOSE 113* 11/07/2009 0455   BUN 11 11/07/2009 0455   CREATININE 0.88 11/07/2009 0455   CALCIUM 9.7 11/07/2009 0455   PROT 8.0 11/07/2009 0455   ALBUMIN 3.8 11/07/2009 0455   AST 29 11/07/2009 0455   ALT 18 11/07/2009 0455   ALKPHOS 98 11/07/2009 0455   BILITOT 0.4 11/07/2009 0455   GFRNONAA >60 11/07/2009 0455   GFRAA  Value: >60        The eGFR has been calculated using the MDRD equation. This calculation has not been validated in all clinical situations. eGFR's persistently <60 mL/min signify possible Chronic Kidney Disease. 11/07/2009 0455       Component Value Date/Time   WBC 7.7 11/07/2009 0455   WBC 11.1* 07/28/2008 0030    WBC 7.7 07/23/2008 1608   HGB 11.8* 11/07/2009 0455   HGB 10.0* 07/28/2008 0030   HGB 11.9* 07/23/2008 1608   HCT 36.3 11/07/2009 0455   HCT 30.7* 07/28/2008 0030   HCT 37.0 07/23/2008 1608   MCV 81.8 11/07/2009 0455   MCV 82.3 07/28/2008 0030   MCV 82.1 07/23/2008 1608    Lipid Panel  No results found for this basename: chol, trig, hdl, cholhdl, vldl, ldlcalc    ABG No results found for this basename: phart, pco2, pco2art, po2, po2art, hco3, tco2, acidbasedef, o2sat     No results found for this basename: TSH   BNP (last 3 results) No results found for this basename: PROBNP,  in the last 8760 hours Cardiac Panel (last 3 results) No results found for this basename: CKTOTAL, CKMB, TROPONINI, RELINDX,  in the last 72 hours  Iron/TIBC/Ferritin No results found for this basename: iron, tibc, ferritin     EKG Orders placed in visit on 02/16/13  . EKG 12-LEAD     Prior Assessment and Plan Problem List as of 02/16/2013     ICD-9-CM     Cardiology Problems   Hypertension       Imaging: No results found.       \

## 2013-02-16 NOTE — Patient Instructions (Addendum)
Your physician recommends that you schedule a follow-up appointment in:  2 months  Your physician has requested that you have a carotid duplex. This test is an ultrasound of the carotid arteries in your neck. It looks at blood flow through these arteries that supply the brain with blood. Allow one hour for this exam. There are no restrictions or special instructions.  Your physician recommends that you return for lab work in: Today

## 2013-02-17 ENCOUNTER — Telehealth: Payer: Self-pay | Admitting: *Deleted

## 2013-02-17 LAB — COMPREHENSIVE METABOLIC PANEL
ALT: 14 U/L (ref 0–35)
AST: 15 U/L (ref 0–37)
Albumin: 4.1 g/dL (ref 3.5–5.2)
Calcium: 9.6 mg/dL (ref 8.4–10.5)
Chloride: 100 mEq/L (ref 96–112)
Creat: 0.85 mg/dL (ref 0.50–1.10)
Potassium: 4.2 mEq/L (ref 3.5–5.3)

## 2013-02-17 LAB — CBC
HCT: 35.9 % — ABNORMAL LOW (ref 36.0–46.0)
MCH: 25.7 pg — ABNORMAL LOW (ref 26.0–34.0)
MCV: 76.2 fL — ABNORMAL LOW (ref 78.0–100.0)
Platelets: 300 10*3/uL (ref 150–400)
RBC: 4.71 MIL/uL (ref 3.87–5.11)

## 2013-02-18 ENCOUNTER — Encounter: Payer: Self-pay | Admitting: Cardiology

## 2013-02-18 DIAGNOSIS — M5136 Other intervertebral disc degeneration, lumbar region: Secondary | ICD-10-CM | POA: Insufficient documentation

## 2013-02-18 DIAGNOSIS — M51369 Other intervertebral disc degeneration, lumbar region without mention of lumbar back pain or lower extremity pain: Secondary | ICD-10-CM | POA: Insufficient documentation

## 2013-02-18 NOTE — Assessment & Plan Note (Signed)
Blood pressure is mildly elevated at this visit. Patient will monitor her values in non-medical settings and returned a list of those measurements to [place]. She uses meloxicam sparingly and is advised to minimize consumption of that medication.  Antihypertensive medications will be adjusted as necessary.

## 2013-02-18 NOTE — Progress Notes (Signed)
Patient ID: Megan Oneal, female   DOB: [birth year], 57 y.o.   MRN: [id number]  HPI: Initial Cardiology evaluation for this very nice 57 year old woman referred by Corrie Mckusick, MD for continued treatment of hypertension and evaluation of cardiovascular risk preceding orthopedic surgery.  Cardiology care was previously provided by [facility] and [facility], but patient was separated from their practice in 2008 as a result of financial issues.  Since then, she has done well from a cardiac standpoint. Exercise is limited by orthopedic issues. She does not experience chest pain or dyspnea. She has no known cardiac disease and reportedly underwent negative stress testing and echocardiography in 2008 as well as a negative Holter monitor study in 2012 when she experienced palpitations. She now requires total knee arthroplasty, first to replace the right knee and probably subsequently on the left.  Current Outpatient Prescriptions on File Prior to Visit  Medication Sig Dispense Refill  . ALPRAZolam (XANAX) 0.5 MG tablet Take 0.5 mg by mouth 3 (three) times daily as needed for sleep.      . calcium-vitamin D (OSCAL) 250-125 MG-UNIT per tablet Take 1 tablet by mouth once a week.      . DEXILANT 60 MG capsule TAKE ONE CAPSULE BY MOUTH 1 TIME A DAY 30 MINUTES BEFORE BREAKFAST  30 capsule  5  . fluticasone (FLONASE) 50 MCG/ACT nasal spray Place 2 sprays into the nose daily.      . furosemide (LASIX) 40 MG tablet Take 40 mg by mouth daily.      Marland Kitchen HYDROcodone-acetaminophen (VICODIN) 5-500 MG per tablet Take 1 tablet by mouth every 6 (six) hours as needed for pain.      Marland Kitchen losartan (COZAAR) 100 MG tablet Take 100 mg by mouth daily.      . medroxyPROGESTERone (PROVERA) 2.5 MG tablet Take 2.5 mg by mouth daily.      . meloxicam (MOBIC) 15 MG tablet Take 15 mg by mouth daily as needed for pain.      Marland Kitchen NEOMYCIN-POLYMYXIN-HC, OTIC, (CORTISPORIN) 1 % SOLN every 6 (six) hours.      . potassium chloride  (K-DUR,KLOR-CON) 10 MEQ tablet Take 10 mEq by mouth daily.      . traMADol (ULTRAM) 50 MG tablet Take 50 mg by mouth every 6 (six) hours as needed for pain.      . verapamil (CALAN-SR) 180 MG CR tablet Take 180 mg by mouth 2 (two) times daily.       No current facility-administered medications on file prior to visit.   Allergies  Allergen Reactions  . Sulfa Antibiotics     Past Medical History  Diagnosis Date  . Hypertension   . Otalgia, unspecified   . Degenerative disc disease, lumbar     Epidural injections in 2007  . Endometriosis     Past Surgical History  Procedure Laterality Date  . Lumbar laminectomy  2009     L4-L5 and L5-S1 laminectomy with diskectomy; transforaminal  . Laparoscopic cholecystectomy  2010  . Tubal ligation    . Breast surgery      fibroid tumor removal @ age 50  . Cyst excision  2007    Scalp  . Colonoscopy  2009    History reviewed. No pertinent family history.  History   Social History  . Marital Status: Divorced    Spouse Name: N/A    Number of Children: N/A  . Years of Education: N/A   Occupational History  . Not on file.  Social History Main Topics  . Smoking status: Former Smoker -- 2.00 packs/day for 40 years    Types: Cigarettes  . Smokeless tobacco: Former Neurosurgeon    Quit date: 12/21/1994  . Alcohol Use: Not on file  . Drug Use: Not on file  . Sexually Active: Not on file   Other Topics Concern  . Not on file   Social History Narrative  . No narrative on file   ROS: Denies orthopnea, PND, lightheadedness, ankle edema or syncope. She continues to experience occasional mild palpitations without associated symptoms.  She describes recent weight gain, mild intermittent ankle edema, severe pain in the knees, negative colonoscopy in 2009, anxiety. She has been postmenopausal since age 44.  Depression has been significant in the past and has caused a degree of insomnia. Prior sleep study was negative for significant obstructive sleep  apnea. All other systems reviewed and are negative.  PHYSICAL EXAM: BP 150/87  Pulse 97  Ht 5\' 8"  (1.727 m)  Wt 138.801 kg (306 lb)  BMI 46.54 kg/m2  SpO2 97%;  Body mass index is 46.54 kg/(m^2). General-Well-developed; no acute distress Body Habitus-obese HEENT-[place]/AT; PERRL; EOM intact; conjunctiva and lids nl Neck-No JVD; right carotid bruit Endocrine-mild thyromegaly Lungs-Clear lung fields; resonant percussion; normal I-to-E ratio Cardiovascular- normal PMI; normal S1 and S2; 1-2/6 systolic ejection murmur at the cardiac base Abdomen-BS normal; soft and non-tender without masses or organomegaly Musculoskeletal-No deformities, cyanosis or clubbing Neurologic-Nl cranial nerves; symmetric strength and tone Skin- Warm, no significant lesions Extremities-Nl distal pulses; no edema  EKG:  Normal sinus rhythm; within normal limits.   Sheldon Bing, MD 02/18/2013  5:15 PM  ASSESSMENT AND PLAN

## 2013-02-18 NOTE — Assessment & Plan Note (Addendum)
Patient has relatively low risk for vascular disease, and I doubt that she has significant carotid obstruction. Ultrasound is pending. Bruit may originate from thyroid vessels. Further testing may be undertaken if carotid studies are negative.  Surgical risk is appropriate for age. Factors contributing to increased cardiovascular risk include postmenopausal status and hypertension. In the absence of suggestive symptoms, no preoperative testing is warranted. Treatment with beta blocker in this setting is of uncertain benefit and will not be undertaken. [facility] will be available to assist with Megan Oneal' care in the hospital if our expertise would be helpful. Prior medical records have been requested and received from Dr. Phillips Odor , but not from [name]. I will see her again in 2 months at which time hypertensive therapy and prior records will be reviewed.

## 2013-02-19 ENCOUNTER — Encounter: Payer: Self-pay | Admitting: Cardiology

## 2013-02-21 ENCOUNTER — Ambulatory Visit (HOSPITAL_COMMUNITY)
Admission: RE | Admit: 2013-02-21 | Discharge: 2013-02-21 | Disposition: A | Discharge status: Home / Self Care | Payer: Medicare Other | Source: Ambulatory Visit | Attending: Cardiology | Admitting: Cardiology

## 2013-02-21 DIAGNOSIS — R0989 Other specified symptoms and signs involving the circulatory and respiratory systems: Secondary | ICD-10-CM | POA: Diagnosis not present

## 2013-02-21 DIAGNOSIS — I658 Occlusion and stenosis of other precerebral arteries: Secondary | ICD-10-CM | POA: Diagnosis not present

## 2013-02-23 ENCOUNTER — Encounter: Payer: Self-pay | Admitting: Cardiology

## 2013-02-27 DIAGNOSIS — J209 Acute bronchitis, unspecified: Secondary | ICD-10-CM | POA: Diagnosis not present

## 2013-02-27 DIAGNOSIS — Z6841 Body Mass Index (BMI) 40.0 and over, adult: Secondary | ICD-10-CM | POA: Diagnosis not present

## 2013-03-03 NOTE — Telephone Encounter (Signed)
Noted encounter without notation, contacted pt to follow up any needs if possible, pt advised she only needed for someone to send her US/EKG results to MD Mentz with the [facility] clinic so he can review for pt to have her knee surgery, advised that we will send that today, sent information via fax in epic, pt understood and will call office with any further assistance needed

## 2013-03-06 ENCOUNTER — Other Ambulatory Visit (INDEPENDENT_AMBULATORY_CARE_PROVIDER_SITE_OTHER): Payer: Self-pay | Admitting: Internal Medicine

## 2013-03-12 ENCOUNTER — Encounter: Payer: Self-pay | Admitting: Cardiology

## 2013-03-12 DIAGNOSIS — Z0189 Encounter for other specified special examinations: Secondary | ICD-10-CM | POA: Insufficient documentation

## 2013-03-12 DIAGNOSIS — E669 Obesity, unspecified: Secondary | ICD-10-CM | POA: Insufficient documentation

## 2013-03-12 DIAGNOSIS — F419 Anxiety disorder, unspecified: Secondary | ICD-10-CM | POA: Insufficient documentation

## 2013-03-12 DIAGNOSIS — I493 Ventricular premature depolarization: Secondary | ICD-10-CM | POA: Insufficient documentation

## 2013-03-12 HISTORY — DX: Anxiety disorder, unspecified: F41.9

## 2013-03-14 ENCOUNTER — Other Ambulatory Visit: Payer: Self-pay | Admitting: *Deleted

## 2013-03-14 DIAGNOSIS — I10 Essential (primary) hypertension: Secondary | ICD-10-CM

## 2013-03-17 ENCOUNTER — Encounter (HOSPITAL_COMMUNITY): Payer: Medicare Other

## 2013-03-20 ENCOUNTER — Encounter (HOSPITAL_COMMUNITY): Payer: Medicare Other

## 2013-03-21 ENCOUNTER — Ambulatory Visit (HOSPITAL_COMMUNITY)
Admission: RE | Admit: 2013-03-21 | Discharge: 2013-03-21 | Disposition: A | Discharge status: Home / Self Care | Payer: Medicare Other | Source: Ambulatory Visit | Attending: Cardiology | Admitting: Cardiology

## 2013-03-21 DIAGNOSIS — I10 Essential (primary) hypertension: Secondary | ICD-10-CM | POA: Diagnosis not present

## 2013-03-21 NOTE — Progress Notes (Signed)
Ambulatory Blood Pressure Monitor in progress 24hrs. 

## 2013-03-29 ENCOUNTER — Other Ambulatory Visit: Payer: Self-pay | Admitting: *Deleted

## 2013-03-29 DIAGNOSIS — I10 Essential (primary) hypertension: Secondary | ICD-10-CM

## 2013-04-04 ENCOUNTER — Ambulatory Visit (HOSPITAL_COMMUNITY): Admission: RE | Admit: 2013-04-04 | Payer: Medicare Other | Source: Ambulatory Visit

## 2013-04-07 ENCOUNTER — Telehealth: Payer: Self-pay | Admitting: Cardiology

## 2013-04-07 ENCOUNTER — Encounter: Payer: Self-pay | Admitting: *Deleted

## 2013-04-07 NOTE — Telephone Encounter (Signed)
Called pt to advise the letter and office notes as well as carotid duplex results have been manually faxed

## 2013-04-07 NOTE — Telephone Encounter (Signed)
Patient calling 04/06/13 at 4:55pm. Patient states that the [facility] clinic has not received a surgical clearance for pt to have knee surgery

## 2013-04-10 ENCOUNTER — Ambulatory Visit: Payer: Self-pay | Admitting: Orthopedic Surgery

## 2013-04-10 DIAGNOSIS — Z01812 Encounter for preprocedural laboratory examination: Secondary | ICD-10-CM | POA: Diagnosis not present

## 2013-04-10 DIAGNOSIS — F329 Major depressive disorder, single episode, unspecified: Secondary | ICD-10-CM | POA: Diagnosis not present

## 2013-04-10 DIAGNOSIS — K219 Gastro-esophageal reflux disease without esophagitis: Secondary | ICD-10-CM | POA: Diagnosis not present

## 2013-04-10 DIAGNOSIS — M1710 Unilateral primary osteoarthritis, unspecified knee: Secondary | ICD-10-CM | POA: Diagnosis not present

## 2013-04-10 DIAGNOSIS — IMO0002 Reserved for concepts with insufficient information to code with codable children: Secondary | ICD-10-CM | POA: Diagnosis not present

## 2013-04-10 DIAGNOSIS — F411 Generalized anxiety disorder: Secondary | ICD-10-CM | POA: Diagnosis not present

## 2013-04-10 DIAGNOSIS — I10 Essential (primary) hypertension: Secondary | ICD-10-CM | POA: Diagnosis not present

## 2013-04-10 LAB — BASIC METABOLIC PANEL
Anion Gap: 7 (ref 7–16)
BUN: 11 mg/dL (ref 7–18)
Calcium, Total: 9.5 mg/dL (ref 8.5–10.1)
Chloride: 102 mmol/L (ref 98–107)
Co2: 27 mmol/L (ref 21–32)
EGFR (African American): >60
EGFR (Non-African Amer.): >60
Glucose: 86 mg/dL (ref 65–99)
Osmolality: 271 (ref 275–301)
Potassium: 3.5 mmol/L (ref 3.5–5.1)

## 2013-04-10 LAB — CBC
HGB: 12 g/dL (ref 12.0–16.0)
MCH: 26.4 pg (ref 26.0–34.0)
MCV: 82 fL (ref 80–100)
Platelet: 376 10*3/uL (ref 150–440)

## 2013-04-10 LAB — MRSA PCR SCREENING

## 2013-04-10 LAB — SEDIMENTATION RATE: Erythrocyte Sed Rate: 44 mm/hr — ABNORMAL HIGH (ref 0–30)

## 2013-04-19 ENCOUNTER — Ambulatory Visit: Payer: Medicare Other | Admitting: Cardiology

## 2013-04-25 ENCOUNTER — Inpatient Hospital Stay: Payer: Self-pay | Admitting: Orthopedic Surgery

## 2013-04-25 DIAGNOSIS — IMO0002 Reserved for concepts with insufficient information to code with codable children: Secondary | ICD-10-CM | POA: Diagnosis not present

## 2013-04-25 DIAGNOSIS — M6281 Muscle weakness (generalized): Secondary | ICD-10-CM | POA: Diagnosis not present

## 2013-04-25 DIAGNOSIS — I4891 Unspecified atrial fibrillation: Secondary | ICD-10-CM | POA: Diagnosis not present

## 2013-04-25 DIAGNOSIS — R269 Unspecified abnormalities of gait and mobility: Secondary | ICD-10-CM | POA: Diagnosis not present

## 2013-04-25 DIAGNOSIS — S99929A Unspecified injury of unspecified foot, initial encounter: Secondary | ICD-10-CM | POA: Diagnosis not present

## 2013-04-25 DIAGNOSIS — M5137 Other intervertebral disc degeneration, lumbosacral region: Secondary | ICD-10-CM | POA: Diagnosis not present

## 2013-04-25 DIAGNOSIS — Z5189 Encounter for other specified aftercare: Secondary | ICD-10-CM | POA: Diagnosis not present

## 2013-04-25 DIAGNOSIS — F329 Major depressive disorder, single episode, unspecified: Secondary | ICD-10-CM | POA: Diagnosis present

## 2013-04-25 DIAGNOSIS — F411 Generalized anxiety disorder: Secondary | ICD-10-CM | POA: Diagnosis present

## 2013-04-25 DIAGNOSIS — I10 Essential (primary) hypertension: Secondary | ICD-10-CM | POA: Diagnosis present

## 2013-04-25 DIAGNOSIS — M1710 Unilateral primary osteoarthritis, unspecified knee: Secondary | ICD-10-CM | POA: Diagnosis not present

## 2013-04-25 DIAGNOSIS — Z9089 Acquired absence of other organs: Secondary | ICD-10-CM | POA: Diagnosis not present

## 2013-04-25 DIAGNOSIS — Z96659 Presence of unspecified artificial knee joint: Secondary | ICD-10-CM | POA: Diagnosis not present

## 2013-04-25 DIAGNOSIS — M25569 Pain in unspecified knee: Secondary | ICD-10-CM | POA: Diagnosis not present

## 2013-04-25 DIAGNOSIS — S8990XA Unspecified injury of unspecified lower leg, initial encounter: Secondary | ICD-10-CM | POA: Diagnosis not present

## 2013-04-25 DIAGNOSIS — Z6841 Body Mass Index (BMI) 40.0 and over, adult: Secondary | ICD-10-CM | POA: Diagnosis not present

## 2013-04-25 DIAGNOSIS — K219 Gastro-esophageal reflux disease without esophagitis: Secondary | ICD-10-CM | POA: Diagnosis present

## 2013-04-25 DIAGNOSIS — Z87898 Personal history of other specified conditions: Secondary | ICD-10-CM | POA: Diagnosis not present

## 2013-04-25 DIAGNOSIS — Z471 Aftercare following joint replacement surgery: Secondary | ICD-10-CM | POA: Diagnosis not present

## 2013-04-25 DIAGNOSIS — Z882 Allergy status to sulfonamides status: Secondary | ICD-10-CM | POA: Diagnosis not present

## 2013-04-25 DIAGNOSIS — G8918 Other acute postprocedural pain: Secondary | ICD-10-CM | POA: Diagnosis not present

## 2013-04-26 LAB — PLATELET COUNT: Platelet: 244 10*3/uL (ref 150–440)

## 2013-04-26 LAB — BASIC METABOLIC PANEL
Calcium, Total: 8.1 mg/dL — ABNORMAL LOW (ref 8.5–10.1)
Chloride: 105 mmol/L (ref 98–107)
Co2: 24 mmol/L (ref 21–32)
Creatinine: 0.65 mg/dL (ref 0.60–1.30)
EGFR (African American): >60
Glucose: 115 mg/dL — ABNORMAL HIGH (ref 65–99)

## 2013-04-26 LAB — HEMOGLOBIN: HGB: 9.9 g/dL — ABNORMAL LOW (ref 12.0–16.0)

## 2013-04-27 LAB — PATHOLOGY REPORT

## 2013-04-28 DIAGNOSIS — M1991 Primary osteoarthritis, unspecified site: Secondary | ICD-10-CM | POA: Diagnosis not present

## 2013-04-28 DIAGNOSIS — M79609 Pain in unspecified limb: Secondary | ICD-10-CM | POA: Diagnosis not present

## 2013-04-28 DIAGNOSIS — R269 Unspecified abnormalities of gait and mobility: Secondary | ICD-10-CM | POA: Diagnosis not present

## 2013-04-28 DIAGNOSIS — Z471 Aftercare following joint replacement surgery: Secondary | ICD-10-CM | POA: Diagnosis not present

## 2013-04-28 DIAGNOSIS — I4891 Unspecified atrial fibrillation: Secondary | ICD-10-CM | POA: Diagnosis not present

## 2013-04-28 DIAGNOSIS — R350 Frequency of micturition: Secondary | ICD-10-CM | POA: Diagnosis not present

## 2013-04-28 DIAGNOSIS — M6281 Muscle weakness (generalized): Secondary | ICD-10-CM | POA: Diagnosis not present

## 2013-04-28 DIAGNOSIS — M7989 Other specified soft tissue disorders: Secondary | ICD-10-CM | POA: Diagnosis not present

## 2013-04-28 DIAGNOSIS — I10 Essential (primary) hypertension: Secondary | ICD-10-CM | POA: Diagnosis not present

## 2013-04-28 DIAGNOSIS — S8990XA Unspecified injury of unspecified lower leg, initial encounter: Secondary | ICD-10-CM | POA: Diagnosis not present

## 2013-04-28 DIAGNOSIS — K219 Gastro-esophageal reflux disease without esophagitis: Secondary | ICD-10-CM | POA: Diagnosis not present

## 2013-04-28 DIAGNOSIS — Z5189 Encounter for other specified aftercare: Secondary | ICD-10-CM | POA: Diagnosis not present

## 2013-04-28 DIAGNOSIS — M5137 Other intervertebral disc degeneration, lumbosacral region: Secondary | ICD-10-CM | POA: Diagnosis not present

## 2013-04-28 DIAGNOSIS — R300 Dysuria: Secondary | ICD-10-CM | POA: Diagnosis not present

## 2013-04-28 DIAGNOSIS — Z96659 Presence of unspecified artificial knee joint: Secondary | ICD-10-CM | POA: Diagnosis not present

## 2013-04-28 DIAGNOSIS — S99929A Unspecified injury of unspecified foot, initial encounter: Secondary | ICD-10-CM | POA: Diagnosis not present

## 2013-05-01 ENCOUNTER — Encounter: Payer: Self-pay | Admitting: Internal Medicine

## 2013-05-01 DIAGNOSIS — I10 Essential (primary) hypertension: Secondary | ICD-10-CM | POA: Diagnosis not present

## 2013-05-01 DIAGNOSIS — M1991 Primary osteoarthritis, unspecified site: Secondary | ICD-10-CM | POA: Diagnosis not present

## 2013-05-01 LAB — URINALYSIS, COMPLETE
Bacteria: NONE SEEN
Blood: NEGATIVE
Glucose,UR: NEGATIVE mg/dL (ref 0–75)
Leukocyte Esterase: NEGATIVE
Nitrite: NEGATIVE
Ph: 7 (ref 4.5–8.0)
Protein: NEGATIVE
RBC,UR: 1 /HPF (ref 0–5)
Specific Gravity: 1.011 (ref 1.003–1.030)
Squamous Epithelial: <1
WBC UR: <1 /HPF (ref 0–5)

## 2013-05-03 LAB — URINE CULTURE

## 2013-05-05 ENCOUNTER — Ambulatory Visit: Payer: Self-pay | Admitting: Gerontology

## 2013-05-05 DIAGNOSIS — M7989 Other specified soft tissue disorders: Secondary | ICD-10-CM | POA: Diagnosis not present

## 2013-05-19 DIAGNOSIS — M6281 Muscle weakness (generalized): Secondary | ICD-10-CM | POA: Diagnosis not present

## 2013-05-19 DIAGNOSIS — M25569 Pain in unspecified knee: Secondary | ICD-10-CM | POA: Diagnosis not present

## 2013-05-19 DIAGNOSIS — Z96659 Presence of unspecified artificial knee joint: Secondary | ICD-10-CM | POA: Diagnosis not present

## 2013-05-19 DIAGNOSIS — M25669 Stiffness of unspecified knee, not elsewhere classified: Secondary | ICD-10-CM | POA: Diagnosis not present

## 2013-05-20 ENCOUNTER — Other Ambulatory Visit: Payer: Self-pay | Admitting: Internal Medicine

## 2013-05-20 ENCOUNTER — Other Ambulatory Visit (INDEPENDENT_AMBULATORY_CARE_PROVIDER_SITE_OTHER): Payer: Self-pay | Admitting: Internal Medicine

## 2013-05-22 DIAGNOSIS — Z96659 Presence of unspecified artificial knee joint: Secondary | ICD-10-CM | POA: Diagnosis not present

## 2013-05-22 DIAGNOSIS — M25669 Stiffness of unspecified knee, not elsewhere classified: Secondary | ICD-10-CM | POA: Diagnosis not present

## 2013-05-22 DIAGNOSIS — M25569 Pain in unspecified knee: Secondary | ICD-10-CM | POA: Diagnosis not present

## 2013-05-22 DIAGNOSIS — M6281 Muscle weakness (generalized): Secondary | ICD-10-CM | POA: Diagnosis not present

## 2013-05-24 DIAGNOSIS — M25569 Pain in unspecified knee: Secondary | ICD-10-CM | POA: Diagnosis not present

## 2013-05-24 DIAGNOSIS — M6281 Muscle weakness (generalized): Secondary | ICD-10-CM | POA: Diagnosis not present

## 2013-05-24 DIAGNOSIS — M25669 Stiffness of unspecified knee, not elsewhere classified: Secondary | ICD-10-CM | POA: Diagnosis not present

## 2013-05-24 DIAGNOSIS — Z96659 Presence of unspecified artificial knee joint: Secondary | ICD-10-CM | POA: Diagnosis not present

## 2013-05-24 NOTE — Telephone Encounter (Signed)
[facility] called said they had faxed over her Verapamil ER 180 prescription twice! It was faxed 5-31 and 6-3-Would you please call today!

## 2013-05-24 NOTE — Telephone Encounter (Signed)
Refill(s) sent to pharmacy.    Pt needs an appt. >7yr since last visit.  One refill authorized until appt.  Message left to return my call.

## 2013-05-25 DIAGNOSIS — Z96659 Presence of unspecified artificial knee joint: Secondary | ICD-10-CM | POA: Diagnosis not present

## 2013-05-26 DIAGNOSIS — M25669 Stiffness of unspecified knee, not elsewhere classified: Secondary | ICD-10-CM | POA: Diagnosis not present

## 2013-05-26 DIAGNOSIS — M6281 Muscle weakness (generalized): Secondary | ICD-10-CM | POA: Diagnosis not present

## 2013-05-26 DIAGNOSIS — Z96659 Presence of unspecified artificial knee joint: Secondary | ICD-10-CM | POA: Diagnosis not present

## 2013-05-26 DIAGNOSIS — M25569 Pain in unspecified knee: Secondary | ICD-10-CM | POA: Diagnosis not present

## 2013-05-26 NOTE — Telephone Encounter (Signed)
Lm to call for a visit

## 2013-05-29 DIAGNOSIS — M25569 Pain in unspecified knee: Secondary | ICD-10-CM | POA: Diagnosis not present

## 2013-05-29 DIAGNOSIS — M25669 Stiffness of unspecified knee, not elsewhere classified: Secondary | ICD-10-CM | POA: Diagnosis not present

## 2013-05-29 DIAGNOSIS — M6281 Muscle weakness (generalized): Secondary | ICD-10-CM | POA: Diagnosis not present

## 2013-05-29 DIAGNOSIS — Z96659 Presence of unspecified artificial knee joint: Secondary | ICD-10-CM | POA: Diagnosis not present

## 2013-05-31 DIAGNOSIS — M25669 Stiffness of unspecified knee, not elsewhere classified: Secondary | ICD-10-CM | POA: Diagnosis not present

## 2013-05-31 DIAGNOSIS — Z96659 Presence of unspecified artificial knee joint: Secondary | ICD-10-CM | POA: Diagnosis not present

## 2013-05-31 DIAGNOSIS — M25569 Pain in unspecified knee: Secondary | ICD-10-CM | POA: Diagnosis not present

## 2013-05-31 DIAGNOSIS — M6281 Muscle weakness (generalized): Secondary | ICD-10-CM | POA: Diagnosis not present

## 2013-06-02 DIAGNOSIS — Z96659 Presence of unspecified artificial knee joint: Secondary | ICD-10-CM | POA: Diagnosis not present

## 2013-06-02 DIAGNOSIS — M25569 Pain in unspecified knee: Secondary | ICD-10-CM | POA: Diagnosis not present

## 2013-06-02 DIAGNOSIS — M6281 Muscle weakness (generalized): Secondary | ICD-10-CM | POA: Diagnosis not present

## 2013-06-02 DIAGNOSIS — M25669 Stiffness of unspecified knee, not elsewhere classified: Secondary | ICD-10-CM | POA: Diagnosis not present

## 2013-06-05 DIAGNOSIS — M25569 Pain in unspecified knee: Secondary | ICD-10-CM | POA: Diagnosis not present

## 2013-06-05 DIAGNOSIS — M6281 Muscle weakness (generalized): Secondary | ICD-10-CM | POA: Diagnosis not present

## 2013-06-05 DIAGNOSIS — Z96659 Presence of unspecified artificial knee joint: Secondary | ICD-10-CM | POA: Diagnosis not present

## 2013-06-05 DIAGNOSIS — M25669 Stiffness of unspecified knee, not elsewhere classified: Secondary | ICD-10-CM | POA: Diagnosis not present

## 2013-06-07 DIAGNOSIS — M25569 Pain in unspecified knee: Secondary | ICD-10-CM | POA: Diagnosis not present

## 2013-06-07 DIAGNOSIS — Z96659 Presence of unspecified artificial knee joint: Secondary | ICD-10-CM | POA: Diagnosis not present

## 2013-06-07 DIAGNOSIS — M6281 Muscle weakness (generalized): Secondary | ICD-10-CM | POA: Diagnosis not present

## 2013-06-07 DIAGNOSIS — M25669 Stiffness of unspecified knee, not elsewhere classified: Secondary | ICD-10-CM | POA: Diagnosis not present

## 2013-06-09 DIAGNOSIS — Z96659 Presence of unspecified artificial knee joint: Secondary | ICD-10-CM | POA: Diagnosis not present

## 2013-06-09 DIAGNOSIS — M25569 Pain in unspecified knee: Secondary | ICD-10-CM | POA: Diagnosis not present

## 2013-06-09 DIAGNOSIS — M6281 Muscle weakness (generalized): Secondary | ICD-10-CM | POA: Diagnosis not present

## 2013-06-09 DIAGNOSIS — M25669 Stiffness of unspecified knee, not elsewhere classified: Secondary | ICD-10-CM | POA: Diagnosis not present

## 2013-06-12 DIAGNOSIS — M25569 Pain in unspecified knee: Secondary | ICD-10-CM | POA: Diagnosis not present

## 2013-06-12 DIAGNOSIS — M25669 Stiffness of unspecified knee, not elsewhere classified: Secondary | ICD-10-CM | POA: Diagnosis not present

## 2013-06-12 DIAGNOSIS — M6281 Muscle weakness (generalized): Secondary | ICD-10-CM | POA: Diagnosis not present

## 2013-06-12 DIAGNOSIS — Z96659 Presence of unspecified artificial knee joint: Secondary | ICD-10-CM | POA: Diagnosis not present

## 2013-06-14 DIAGNOSIS — Z96659 Presence of unspecified artificial knee joint: Secondary | ICD-10-CM | POA: Diagnosis not present

## 2013-06-14 DIAGNOSIS — M25569 Pain in unspecified knee: Secondary | ICD-10-CM | POA: Diagnosis not present

## 2013-06-14 DIAGNOSIS — M25669 Stiffness of unspecified knee, not elsewhere classified: Secondary | ICD-10-CM | POA: Diagnosis not present

## 2013-06-14 DIAGNOSIS — M6281 Muscle weakness (generalized): Secondary | ICD-10-CM | POA: Diagnosis not present

## 2013-06-16 DIAGNOSIS — M25569 Pain in unspecified knee: Secondary | ICD-10-CM | POA: Diagnosis not present

## 2013-06-16 DIAGNOSIS — Z96659 Presence of unspecified artificial knee joint: Secondary | ICD-10-CM | POA: Diagnosis not present

## 2013-06-16 DIAGNOSIS — M25669 Stiffness of unspecified knee, not elsewhere classified: Secondary | ICD-10-CM | POA: Diagnosis not present

## 2013-06-16 DIAGNOSIS — M6281 Muscle weakness (generalized): Secondary | ICD-10-CM | POA: Diagnosis not present

## 2013-06-19 DIAGNOSIS — Z96659 Presence of unspecified artificial knee joint: Secondary | ICD-10-CM | POA: Diagnosis not present

## 2013-07-15 ENCOUNTER — Other Ambulatory Visit: Payer: Self-pay | Admitting: Internal Medicine

## 2013-07-17 NOTE — Telephone Encounter (Signed)
Patient hasn't been seen in our office since 05/04/2012. Patient has since seen Dr. [place] Bing at [facility] in [place] on 02/16/2013. Refused request and advised the pharmacy to request from Dr. Dietrich Pates.

## 2013-08-25 DIAGNOSIS — Z96659 Presence of unspecified artificial knee joint: Secondary | ICD-10-CM | POA: Diagnosis not present

## 2013-09-12 ENCOUNTER — Encounter: Payer: Self-pay | Admitting: *Deleted

## 2013-09-28 ENCOUNTER — Other Ambulatory Visit: Payer: Self-pay | Admitting: Obstetrics & Gynecology

## 2013-09-28 DIAGNOSIS — Z139 Encounter for screening, unspecified: Secondary | ICD-10-CM

## 2013-10-06 ENCOUNTER — Ambulatory Visit (HOSPITAL_COMMUNITY)
Admission: RE | Admit: 2013-10-06 | Discharge: 2013-10-06 | Disposition: A | Discharge status: Home / Self Care | Payer: Medicare Other | Source: Ambulatory Visit | Attending: Obstetrics & Gynecology | Admitting: Obstetrics & Gynecology

## 2013-10-06 DIAGNOSIS — Z139 Encounter for screening, unspecified: Secondary | ICD-10-CM

## 2013-10-06 DIAGNOSIS — Z1231 Encounter for screening mammogram for malignant neoplasm of breast: Secondary | ICD-10-CM | POA: Insufficient documentation

## 2013-10-13 ENCOUNTER — Other Ambulatory Visit: Payer: Self-pay | Admitting: Obstetrics & Gynecology

## 2013-11-09 ENCOUNTER — Other Ambulatory Visit: Payer: Self-pay | Admitting: Obstetrics & Gynecology

## 2013-11-27 DIAGNOSIS — N390 Urinary tract infection, site not specified: Secondary | ICD-10-CM | POA: Diagnosis not present

## 2013-11-27 DIAGNOSIS — Z6841 Body Mass Index (BMI) 40.0 and over, adult: Secondary | ICD-10-CM | POA: Diagnosis not present

## 2013-11-27 DIAGNOSIS — J309 Allergic rhinitis, unspecified: Secondary | ICD-10-CM | POA: Diagnosis not present

## 2013-11-27 DIAGNOSIS — Z79899 Other long term (current) drug therapy: Secondary | ICD-10-CM | POA: Diagnosis not present

## 2013-11-27 DIAGNOSIS — R350 Frequency of micturition: Secondary | ICD-10-CM | POA: Diagnosis not present

## 2013-11-27 DIAGNOSIS — J069 Acute upper respiratory infection, unspecified: Secondary | ICD-10-CM | POA: Diagnosis not present

## 2014-01-26 DIAGNOSIS — M1710 Unilateral primary osteoarthritis, unspecified knee: Secondary | ICD-10-CM | POA: Diagnosis not present

## 2014-01-26 DIAGNOSIS — IMO0002 Reserved for concepts with insufficient information to code with codable children: Secondary | ICD-10-CM | POA: Diagnosis not present

## 2014-01-30 DIAGNOSIS — J309 Allergic rhinitis, unspecified: Secondary | ICD-10-CM | POA: Diagnosis not present

## 2014-01-30 DIAGNOSIS — D509 Iron deficiency anemia, unspecified: Secondary | ICD-10-CM | POA: Diagnosis not present

## 2014-01-30 DIAGNOSIS — J069 Acute upper respiratory infection, unspecified: Secondary | ICD-10-CM | POA: Diagnosis not present

## 2014-01-30 DIAGNOSIS — Z6841 Body Mass Index (BMI) 40.0 and over, adult: Secondary | ICD-10-CM | POA: Diagnosis not present

## 2014-01-31 ENCOUNTER — Ambulatory Visit: Payer: Self-pay | Admitting: Orthopedic Surgery

## 2014-01-31 DIAGNOSIS — M25569 Pain in unspecified knee: Secondary | ICD-10-CM | POA: Diagnosis not present

## 2014-01-31 DIAGNOSIS — M1710 Unilateral primary osteoarthritis, unspecified knee: Secondary | ICD-10-CM | POA: Diagnosis not present

## 2014-01-31 DIAGNOSIS — IMO0002 Reserved for concepts with insufficient information to code with codable children: Secondary | ICD-10-CM | POA: Diagnosis not present

## 2014-03-19 ENCOUNTER — Ambulatory Visit: Payer: Self-pay | Admitting: Orthopedic Surgery

## 2014-03-19 DIAGNOSIS — IMO0002 Reserved for concepts with insufficient information to code with codable children: Secondary | ICD-10-CM | POA: Diagnosis not present

## 2014-03-19 DIAGNOSIS — F411 Generalized anxiety disorder: Secondary | ICD-10-CM | POA: Diagnosis not present

## 2014-03-19 DIAGNOSIS — E669 Obesity, unspecified: Secondary | ICD-10-CM | POA: Diagnosis not present

## 2014-03-19 DIAGNOSIS — Z01810 Encounter for preprocedural cardiovascular examination: Secondary | ICD-10-CM | POA: Diagnosis not present

## 2014-03-19 DIAGNOSIS — F3289 Other specified depressive episodes: Secondary | ICD-10-CM | POA: Diagnosis not present

## 2014-03-19 DIAGNOSIS — Z79899 Other long term (current) drug therapy: Secondary | ICD-10-CM | POA: Diagnosis not present

## 2014-03-19 DIAGNOSIS — D62 Acute posthemorrhagic anemia: Secondary | ICD-10-CM | POA: Diagnosis not present

## 2014-03-19 DIAGNOSIS — I10 Essential (primary) hypertension: Secondary | ICD-10-CM | POA: Diagnosis not present

## 2014-03-19 DIAGNOSIS — Z01812 Encounter for preprocedural laboratory examination: Secondary | ICD-10-CM | POA: Diagnosis not present

## 2014-03-19 DIAGNOSIS — F329 Major depressive disorder, single episode, unspecified: Secondary | ICD-10-CM | POA: Diagnosis not present

## 2014-03-19 DIAGNOSIS — E119 Type 2 diabetes mellitus without complications: Secondary | ICD-10-CM | POA: Diagnosis not present

## 2014-03-19 LAB — URINALYSIS, COMPLETE
BACTERIA: NONE SEEN
BILIRUBIN, UR: NEGATIVE
Blood: NEGATIVE
Glucose,UR: NEGATIVE mg/dL (ref 0–75)
Ketone: NEGATIVE
NITRITE: NEGATIVE
Ph: 5 (ref 4.5–8.0)
Protein: NEGATIVE
RBC,UR: 2 /HPF (ref 0–5)
Specific Gravity: 1.024 (ref 1.003–1.030)
Squamous Epithelial: 4

## 2014-03-19 LAB — PROTIME-INR
INR: 1.1
PROTHROMBIN TIME: 14 s (ref 11.5–14.7)

## 2014-03-19 LAB — CBC
HCT: 36.4 % (ref 35.0–47.0)
HGB: 11.5 g/dL — ABNORMAL LOW (ref 12.0–16.0)
MCH: 26 pg (ref 26.0–34.0)
MCHC: 31.6 g/dL — AB (ref 32.0–36.0)
MCV: 82 fL (ref 80–100)
Platelet: 301 10*3/uL (ref 150–440)
RBC: 4.42 10*6/uL (ref 3.80–5.20)
RDW: 15.8 % — AB (ref 11.5–14.5)
WBC: 8.6 10*3/uL (ref 3.6–11.0)

## 2014-03-19 LAB — BASIC METABOLIC PANEL
Anion Gap: 6 — ABNORMAL LOW (ref 7–16)
BUN: 8 mg/dL (ref 7–18)
CALCIUM: 9 mg/dL (ref 8.5–10.1)
CO2: 27 mmol/L (ref 21–32)
Chloride: 107 mmol/L (ref 98–107)
Creatinine: 1.04 mg/dL (ref 0.60–1.30)
EGFR (African American): >60
EGFR (Non-African Amer.): >60
GLUCOSE: 83 mg/dL (ref 65–99)
Osmolality: 277 (ref 275–301)
POTASSIUM: 3.4 mmol/L — AB (ref 3.5–5.1)
Sodium: 140 mmol/L (ref 136–145)

## 2014-03-19 LAB — SEDIMENTATION RATE: Erythrocyte Sed Rate: 17 mm/hr (ref 0–30)

## 2014-03-19 LAB — MRSA PCR SCREENING

## 2014-03-19 LAB — APTT: Activated PTT: 27.1 secs (ref 23.6–35.9)

## 2014-03-27 ENCOUNTER — Inpatient Hospital Stay: Payer: Self-pay | Admitting: Orthopedic Surgery

## 2014-03-27 DIAGNOSIS — G43909 Migraine, unspecified, not intractable, without status migrainosus: Secondary | ICD-10-CM | POA: Diagnosis present

## 2014-03-27 DIAGNOSIS — Z471 Aftercare following joint replacement surgery: Secondary | ICD-10-CM | POA: Diagnosis not present

## 2014-03-27 DIAGNOSIS — F3289 Other specified depressive episodes: Secondary | ICD-10-CM | POA: Diagnosis present

## 2014-03-27 DIAGNOSIS — Z79899 Other long term (current) drug therapy: Secondary | ICD-10-CM | POA: Diagnosis not present

## 2014-03-27 DIAGNOSIS — R42 Dizziness and giddiness: Secondary | ICD-10-CM | POA: Diagnosis present

## 2014-03-27 DIAGNOSIS — F411 Generalized anxiety disorder: Secondary | ICD-10-CM | POA: Diagnosis present

## 2014-03-27 DIAGNOSIS — K219 Gastro-esophageal reflux disease without esophagitis: Secondary | ICD-10-CM | POA: Diagnosis present

## 2014-03-27 DIAGNOSIS — Z832 Family history of diseases of the blood and blood-forming organs and certain disorders involving the immune mechanism: Secondary | ICD-10-CM | POA: Diagnosis not present

## 2014-03-27 DIAGNOSIS — Z9089 Acquired absence of other organs: Secondary | ICD-10-CM | POA: Diagnosis not present

## 2014-03-27 DIAGNOSIS — M2340 Loose body in knee, unspecified knee: Secondary | ICD-10-CM | POA: Diagnosis present

## 2014-03-27 DIAGNOSIS — D62 Acute posthemorrhagic anemia: Secondary | ICD-10-CM | POA: Diagnosis not present

## 2014-03-27 DIAGNOSIS — Z87891 Personal history of nicotine dependence: Secondary | ICD-10-CM | POA: Diagnosis not present

## 2014-03-27 DIAGNOSIS — D649 Anemia, unspecified: Secondary | ICD-10-CM | POA: Diagnosis present

## 2014-03-27 DIAGNOSIS — M479 Spondylosis, unspecified: Secondary | ICD-10-CM | POA: Diagnosis present

## 2014-03-27 DIAGNOSIS — R002 Palpitations: Secondary | ICD-10-CM | POA: Diagnosis present

## 2014-03-27 DIAGNOSIS — Z882 Allergy status to sulfonamides status: Secondary | ICD-10-CM | POA: Diagnosis not present

## 2014-03-27 DIAGNOSIS — M7120 Synovial cyst of popliteal space [Baker], unspecified knee: Secondary | ICD-10-CM | POA: Diagnosis present

## 2014-03-27 DIAGNOSIS — M549 Dorsalgia, unspecified: Secondary | ICD-10-CM | POA: Diagnosis present

## 2014-03-27 DIAGNOSIS — I10 Essential (primary) hypertension: Secondary | ICD-10-CM | POA: Diagnosis present

## 2014-03-27 DIAGNOSIS — M1710 Unilateral primary osteoarthritis, unspecified knee: Secondary | ICD-10-CM | POA: Diagnosis present

## 2014-03-27 DIAGNOSIS — R609 Edema, unspecified: Secondary | ICD-10-CM | POA: Diagnosis present

## 2014-03-27 DIAGNOSIS — F329 Major depressive disorder, single episode, unspecified: Secondary | ICD-10-CM | POA: Diagnosis present

## 2014-03-27 DIAGNOSIS — Z96659 Presence of unspecified artificial knee joint: Secondary | ICD-10-CM | POA: Diagnosis not present

## 2014-03-27 DIAGNOSIS — M6580 Other synovitis and tenosynovitis, unspecified site: Secondary | ICD-10-CM | POA: Diagnosis present

## 2014-03-27 DIAGNOSIS — Z8249 Family history of ischemic heart disease and other diseases of the circulatory system: Secondary | ICD-10-CM | POA: Diagnosis not present

## 2014-03-27 DIAGNOSIS — E119 Type 2 diabetes mellitus without complications: Secondary | ICD-10-CM | POA: Diagnosis present

## 2014-03-27 DIAGNOSIS — E669 Obesity, unspecified: Secondary | ICD-10-CM | POA: Diagnosis present

## 2014-03-27 DIAGNOSIS — IMO0002 Reserved for concepts with insufficient information to code with codable children: Secondary | ICD-10-CM | POA: Diagnosis not present

## 2014-03-27 LAB — URINALYSIS, COMPLETE
BILIRUBIN, UR: NEGATIVE
Bacteria: NONE SEEN
Blood: NEGATIVE
Glucose,UR: NEGATIVE mg/dL (ref 0–75)
Ketone: NEGATIVE
NITRITE: NEGATIVE
PROTEIN: NEGATIVE
Ph: 6 (ref 4.5–8.0)
RBC,UR: 1 /HPF (ref 0–5)
SPECIFIC GRAVITY: 1.013 (ref 1.003–1.030)
Squamous Epithelial: 6

## 2014-03-28 LAB — BASIC METABOLIC PANEL
Anion Gap: 4 — ABNORMAL LOW (ref 7–16)
BUN: 7 mg/dL (ref 7–18)
CREATININE: 0.86 mg/dL (ref 0.60–1.30)
Calcium, Total: 8.4 mg/dL — ABNORMAL LOW (ref 8.5–10.1)
Chloride: 108 mmol/L — ABNORMAL HIGH (ref 98–107)
Co2: 27 mmol/L (ref 21–32)
EGFR (African American): >60
Glucose: 112 mg/dL — ABNORMAL HIGH (ref 65–99)
Osmolality: 276 (ref 275–301)
Potassium: 3.8 mmol/L (ref 3.5–5.1)
SODIUM: 139 mmol/L (ref 136–145)

## 2014-03-28 LAB — PLATELET COUNT: Platelet: 266 10*3/uL (ref 150–440)

## 2014-03-28 LAB — HEMOGLOBIN: HGB: 10.5 g/dL — AB (ref 12.0–16.0)

## 2014-03-30 LAB — PATHOLOGY REPORT

## 2014-03-31 DIAGNOSIS — I4891 Unspecified atrial fibrillation: Secondary | ICD-10-CM | POA: Diagnosis not present

## 2014-03-31 DIAGNOSIS — I10 Essential (primary) hypertension: Secondary | ICD-10-CM | POA: Diagnosis not present

## 2014-03-31 DIAGNOSIS — M5106 Intervertebral disc disorders with myelopathy, lumbar region: Secondary | ICD-10-CM | POA: Diagnosis not present

## 2014-03-31 DIAGNOSIS — Z96659 Presence of unspecified artificial knee joint: Secondary | ICD-10-CM | POA: Diagnosis not present

## 2014-03-31 DIAGNOSIS — IMO0001 Reserved for inherently not codable concepts without codable children: Secondary | ICD-10-CM | POA: Diagnosis not present

## 2014-03-31 DIAGNOSIS — Z471 Aftercare following joint replacement surgery: Secondary | ICD-10-CM | POA: Diagnosis not present

## 2014-04-02 DIAGNOSIS — I4891 Unspecified atrial fibrillation: Secondary | ICD-10-CM | POA: Diagnosis not present

## 2014-04-02 DIAGNOSIS — Z471 Aftercare following joint replacement surgery: Secondary | ICD-10-CM | POA: Diagnosis not present

## 2014-04-02 DIAGNOSIS — Z96659 Presence of unspecified artificial knee joint: Secondary | ICD-10-CM | POA: Diagnosis not present

## 2014-04-02 DIAGNOSIS — M5106 Intervertebral disc disorders with myelopathy, lumbar region: Secondary | ICD-10-CM | POA: Diagnosis not present

## 2014-04-02 DIAGNOSIS — IMO0001 Reserved for inherently not codable concepts without codable children: Secondary | ICD-10-CM | POA: Diagnosis not present

## 2014-04-02 DIAGNOSIS — I10 Essential (primary) hypertension: Secondary | ICD-10-CM | POA: Diagnosis not present

## 2014-04-03 DIAGNOSIS — Z96659 Presence of unspecified artificial knee joint: Secondary | ICD-10-CM | POA: Diagnosis not present

## 2014-04-03 DIAGNOSIS — I10 Essential (primary) hypertension: Secondary | ICD-10-CM | POA: Diagnosis not present

## 2014-04-03 DIAGNOSIS — M5106 Intervertebral disc disorders with myelopathy, lumbar region: Secondary | ICD-10-CM | POA: Diagnosis not present

## 2014-04-03 DIAGNOSIS — IMO0001 Reserved for inherently not codable concepts without codable children: Secondary | ICD-10-CM | POA: Diagnosis not present

## 2014-04-03 DIAGNOSIS — I4891 Unspecified atrial fibrillation: Secondary | ICD-10-CM | POA: Diagnosis not present

## 2014-04-03 DIAGNOSIS — Z471 Aftercare following joint replacement surgery: Secondary | ICD-10-CM | POA: Diagnosis not present

## 2014-04-04 DIAGNOSIS — I10 Essential (primary) hypertension: Secondary | ICD-10-CM | POA: Diagnosis not present

## 2014-04-04 DIAGNOSIS — Z471 Aftercare following joint replacement surgery: Secondary | ICD-10-CM | POA: Diagnosis not present

## 2014-04-04 DIAGNOSIS — IMO0001 Reserved for inherently not codable concepts without codable children: Secondary | ICD-10-CM | POA: Diagnosis not present

## 2014-04-04 DIAGNOSIS — I4891 Unspecified atrial fibrillation: Secondary | ICD-10-CM | POA: Diagnosis not present

## 2014-04-04 DIAGNOSIS — Z96659 Presence of unspecified artificial knee joint: Secondary | ICD-10-CM | POA: Diagnosis not present

## 2014-04-04 DIAGNOSIS — M5106 Intervertebral disc disorders with myelopathy, lumbar region: Secondary | ICD-10-CM | POA: Diagnosis not present

## 2014-04-05 DIAGNOSIS — I4891 Unspecified atrial fibrillation: Secondary | ICD-10-CM | POA: Diagnosis not present

## 2014-04-05 DIAGNOSIS — IMO0001 Reserved for inherently not codable concepts without codable children: Secondary | ICD-10-CM | POA: Diagnosis not present

## 2014-04-05 DIAGNOSIS — I10 Essential (primary) hypertension: Secondary | ICD-10-CM | POA: Diagnosis not present

## 2014-04-05 DIAGNOSIS — Z471 Aftercare following joint replacement surgery: Secondary | ICD-10-CM | POA: Diagnosis not present

## 2014-04-05 DIAGNOSIS — Z96659 Presence of unspecified artificial knee joint: Secondary | ICD-10-CM | POA: Diagnosis not present

## 2014-04-05 DIAGNOSIS — M5106 Intervertebral disc disorders with myelopathy, lumbar region: Secondary | ICD-10-CM | POA: Diagnosis not present

## 2014-04-06 DIAGNOSIS — Z96659 Presence of unspecified artificial knee joint: Secondary | ICD-10-CM | POA: Diagnosis not present

## 2014-04-06 DIAGNOSIS — IMO0001 Reserved for inherently not codable concepts without codable children: Secondary | ICD-10-CM | POA: Diagnosis not present

## 2014-04-06 DIAGNOSIS — M5106 Intervertebral disc disorders with myelopathy, lumbar region: Secondary | ICD-10-CM | POA: Diagnosis not present

## 2014-04-06 DIAGNOSIS — I4891 Unspecified atrial fibrillation: Secondary | ICD-10-CM | POA: Diagnosis not present

## 2014-04-06 DIAGNOSIS — I10 Essential (primary) hypertension: Secondary | ICD-10-CM | POA: Diagnosis not present

## 2014-04-06 DIAGNOSIS — Z471 Aftercare following joint replacement surgery: Secondary | ICD-10-CM | POA: Diagnosis not present

## 2014-04-09 DIAGNOSIS — Z96659 Presence of unspecified artificial knee joint: Secondary | ICD-10-CM | POA: Diagnosis not present

## 2014-04-09 DIAGNOSIS — IMO0001 Reserved for inherently not codable concepts without codable children: Secondary | ICD-10-CM | POA: Diagnosis not present

## 2014-04-09 DIAGNOSIS — M5106 Intervertebral disc disorders with myelopathy, lumbar region: Secondary | ICD-10-CM | POA: Diagnosis not present

## 2014-04-09 DIAGNOSIS — I4891 Unspecified atrial fibrillation: Secondary | ICD-10-CM | POA: Diagnosis not present

## 2014-04-09 DIAGNOSIS — Z471 Aftercare following joint replacement surgery: Secondary | ICD-10-CM | POA: Diagnosis not present

## 2014-04-09 DIAGNOSIS — I10 Essential (primary) hypertension: Secondary | ICD-10-CM | POA: Diagnosis not present

## 2014-04-10 DIAGNOSIS — M25569 Pain in unspecified knee: Secondary | ICD-10-CM | POA: Diagnosis not present

## 2014-04-10 DIAGNOSIS — M6281 Muscle weakness (generalized): Secondary | ICD-10-CM | POA: Diagnosis not present

## 2014-04-10 DIAGNOSIS — M25669 Stiffness of unspecified knee, not elsewhere classified: Secondary | ICD-10-CM | POA: Diagnosis not present

## 2014-04-10 DIAGNOSIS — Z96659 Presence of unspecified artificial knee joint: Secondary | ICD-10-CM | POA: Diagnosis not present

## 2014-04-13 DIAGNOSIS — M6281 Muscle weakness (generalized): Secondary | ICD-10-CM | POA: Diagnosis not present

## 2014-04-13 DIAGNOSIS — M25569 Pain in unspecified knee: Secondary | ICD-10-CM | POA: Diagnosis not present

## 2014-04-13 DIAGNOSIS — Z96659 Presence of unspecified artificial knee joint: Secondary | ICD-10-CM | POA: Diagnosis not present

## 2014-04-13 DIAGNOSIS — M25669 Stiffness of unspecified knee, not elsewhere classified: Secondary | ICD-10-CM | POA: Diagnosis not present

## 2014-04-16 DIAGNOSIS — M25569 Pain in unspecified knee: Secondary | ICD-10-CM | POA: Diagnosis not present

## 2014-04-16 DIAGNOSIS — Z96659 Presence of unspecified artificial knee joint: Secondary | ICD-10-CM | POA: Diagnosis not present

## 2014-04-16 DIAGNOSIS — M6281 Muscle weakness (generalized): Secondary | ICD-10-CM | POA: Diagnosis not present

## 2014-04-16 DIAGNOSIS — M25669 Stiffness of unspecified knee, not elsewhere classified: Secondary | ICD-10-CM | POA: Diagnosis not present

## 2014-04-18 DIAGNOSIS — M25669 Stiffness of unspecified knee, not elsewhere classified: Secondary | ICD-10-CM | POA: Diagnosis not present

## 2014-04-18 DIAGNOSIS — Z96659 Presence of unspecified artificial knee joint: Secondary | ICD-10-CM | POA: Diagnosis not present

## 2014-04-18 DIAGNOSIS — M6281 Muscle weakness (generalized): Secondary | ICD-10-CM | POA: Diagnosis not present

## 2014-04-18 DIAGNOSIS — M25569 Pain in unspecified knee: Secondary | ICD-10-CM | POA: Diagnosis not present

## 2014-04-23 DIAGNOSIS — M25569 Pain in unspecified knee: Secondary | ICD-10-CM | POA: Diagnosis not present

## 2014-04-23 DIAGNOSIS — M25669 Stiffness of unspecified knee, not elsewhere classified: Secondary | ICD-10-CM | POA: Diagnosis not present

## 2014-04-23 DIAGNOSIS — M6281 Muscle weakness (generalized): Secondary | ICD-10-CM | POA: Diagnosis not present

## 2014-04-23 DIAGNOSIS — Z96659 Presence of unspecified artificial knee joint: Secondary | ICD-10-CM | POA: Diagnosis not present

## 2014-04-25 DIAGNOSIS — M6281 Muscle weakness (generalized): Secondary | ICD-10-CM | POA: Diagnosis not present

## 2014-04-25 DIAGNOSIS — Z96659 Presence of unspecified artificial knee joint: Secondary | ICD-10-CM | POA: Diagnosis not present

## 2014-04-25 DIAGNOSIS — M25669 Stiffness of unspecified knee, not elsewhere classified: Secondary | ICD-10-CM | POA: Diagnosis not present

## 2014-04-27 DIAGNOSIS — Z96659 Presence of unspecified artificial knee joint: Secondary | ICD-10-CM | POA: Diagnosis not present

## 2014-04-30 DIAGNOSIS — Z96659 Presence of unspecified artificial knee joint: Secondary | ICD-10-CM | POA: Diagnosis not present

## 2014-05-02 DIAGNOSIS — Z96659 Presence of unspecified artificial knee joint: Secondary | ICD-10-CM | POA: Diagnosis not present

## 2014-05-04 DIAGNOSIS — Z96659 Presence of unspecified artificial knee joint: Secondary | ICD-10-CM | POA: Diagnosis not present

## 2014-05-07 DIAGNOSIS — Z96659 Presence of unspecified artificial knee joint: Secondary | ICD-10-CM | POA: Diagnosis not present

## 2014-05-09 DIAGNOSIS — Z96659 Presence of unspecified artificial knee joint: Secondary | ICD-10-CM | POA: Diagnosis not present

## 2014-05-11 DIAGNOSIS — Z96659 Presence of unspecified artificial knee joint: Secondary | ICD-10-CM | POA: Diagnosis not present

## 2014-05-16 ENCOUNTER — Other Ambulatory Visit (INDEPENDENT_AMBULATORY_CARE_PROVIDER_SITE_OTHER): Payer: Self-pay | Admitting: Internal Medicine

## 2014-06-09 ENCOUNTER — Emergency Department (HOSPITAL_COMMUNITY)
Admission: EM | Admit: 2014-06-09 | Discharge: 2014-06-09 | Disposition: A | Discharge status: Home / Self Care | Payer: Medicare Other | Attending: Emergency Medicine | Admitting: Emergency Medicine

## 2014-06-09 ENCOUNTER — Encounter (HOSPITAL_COMMUNITY): Payer: Self-pay | Admitting: Emergency Medicine

## 2014-06-09 DIAGNOSIS — F411 Generalized anxiety disorder: Secondary | ICD-10-CM | POA: Insufficient documentation

## 2014-06-09 DIAGNOSIS — IMO0002 Reserved for concepts with insufficient information to code with codable children: Secondary | ICD-10-CM | POA: Insufficient documentation

## 2014-06-09 DIAGNOSIS — I10 Essential (primary) hypertension: Secondary | ICD-10-CM | POA: Diagnosis not present

## 2014-06-09 DIAGNOSIS — M51379 Other intervertebral disc degeneration, lumbosacral region without mention of lumbar back pain or lower extremity pain: Secondary | ICD-10-CM | POA: Insufficient documentation

## 2014-06-09 DIAGNOSIS — I4949 Other premature depolarization: Secondary | ICD-10-CM | POA: Diagnosis not present

## 2014-06-09 DIAGNOSIS — I493 Ventricular premature depolarization: Secondary | ICD-10-CM

## 2014-06-09 DIAGNOSIS — F419 Anxiety disorder, unspecified: Secondary | ICD-10-CM

## 2014-06-09 DIAGNOSIS — Z87891 Personal history of nicotine dependence: Secondary | ICD-10-CM | POA: Insufficient documentation

## 2014-06-09 DIAGNOSIS — Z8742 Personal history of other diseases of the female genital tract: Secondary | ICD-10-CM | POA: Diagnosis not present

## 2014-06-09 DIAGNOSIS — M5137 Other intervertebral disc degeneration, lumbosacral region: Secondary | ICD-10-CM | POA: Insufficient documentation

## 2014-06-09 DIAGNOSIS — Z79899 Other long term (current) drug therapy: Secondary | ICD-10-CM | POA: Diagnosis not present

## 2014-06-09 DIAGNOSIS — R002 Palpitations: Secondary | ICD-10-CM | POA: Diagnosis not present

## 2014-06-09 LAB — CBC WITH DIFFERENTIAL/PLATELET
Basophils Absolute: 0 10*3/uL (ref 0.0–0.1)
Basophils Relative: 1 % (ref 0–1)
Eosinophils Absolute: 0.2 10*3/uL (ref 0.0–0.7)
Eosinophils Relative: 4 % (ref 0–5)
HCT: 37.2 % (ref 36.0–46.0)
HEMOGLOBIN: 12.2 g/dL (ref 12.0–15.0)
LYMPHS ABS: 2.2 10*3/uL (ref 0.7–4.0)
LYMPHS PCT: 35 % (ref 12–46)
MCH: 26.9 pg (ref 26.0–34.0)
MCHC: 32.8 g/dL (ref 30.0–36.0)
MCV: 81.9 fL (ref 78.0–100.0)
MONOS PCT: 6 % (ref 3–12)
Monocytes Absolute: 0.4 10*3/uL (ref 0.1–1.0)
NEUTROS PCT: 54 % (ref 43–77)
Neutro Abs: 3.5 10*3/uL (ref 1.7–7.7)
PLATELETS: 274 10*3/uL (ref 150–400)
RBC: 4.54 MIL/uL (ref 3.87–5.11)
RDW: 14 % (ref 11.5–15.5)
WBC: 6.3 10*3/uL (ref 4.0–10.5)

## 2014-06-09 LAB — BASIC METABOLIC PANEL
BUN: 6 mg/dL (ref 6–23)
CHLORIDE: 103 meq/L (ref 96–112)
CO2: 26 meq/L (ref 19–32)
Calcium: 9.3 mg/dL (ref 8.4–10.5)
Creatinine, Ser: 0.78 mg/dL (ref 0.50–1.10)
GFR calc Af Amer: >90 mL/min (ref >90)
GFR calc non Af Amer: >90 mL/min (ref >90)
GLUCOSE: 138 mg/dL — AB (ref 70–99)
POTASSIUM: 3.5 meq/L — AB (ref 3.7–5.3)
SODIUM: 141 meq/L (ref 137–147)

## 2014-06-09 LAB — TROPONIN I: Troponin I: <0.3 ng/mL (ref <0.30)

## 2014-06-09 NOTE — ED Provider Notes (Addendum)
CSN: [id number]     Arrival date & time 06/09/14  1437 History   None    Chief Complaint  Patient presents with  . Palpitations   Patient is a 58 y.o. female presenting with palpitations. The history is provided by the patient. No language interpreter was used.  Palpitations Associated symptoms: no shortness of breath    This chart was scribed for No att. providers found by Thea Alken, ED Scribe. This patient was seen in room APA06/APA06 and the patient's care was started at 2:59 PM.  HPI Comments:  Megan Oneal is a 58 y.o. female who present to the Emergency Department complaining of intermittent palpitation x 3 days. States palpitations started as intermittent but lately have become more constant, 'back to back". Pt states the palpitation have worsened due to drinking soda, even though she reports she is not supposed to be drinking sodas. She reports palpitation make her anxious and nervous. Pt denies SOB. Pt has seen a cardiologist in the past.   Past Medical History  Diagnosis Date  . Hypertension   . Otalgia, unspecified   . Degenerative disc disease, lumbar     Epidural injections in 2007  . Endometriosis   . Vascular bruit 01/2013    Right neck  . Anxiety 03/12/2013  . Palpitations 10/04/2010  . Frequent PVCs     12,000/24 hours   Past Surgical History  Procedure Laterality Date  . Lumbar laminectomy  2009     L4-L5 and L5-S1 laminectomy with diskectomy; transforaminal  . Laparoscopic cholecystectomy  2010  . Tubal ligation    . Breast surgery      benign tumor excised-age 37  . Cyst excision  2007    Scalp  . Colonoscopy  2009  . Total knee arthroplasty Bilateral    History reviewed. No pertinent family history. History  Substance Use Topics  . Smoking status: Former Smoker -- 2.00 packs/day for 20 years    Types: Cigarettes  . Smokeless tobacco: Former Systems developer    Quit date: 12/21/1994  . Alcohol Use: No   OB History   Grav Para Term Preterm Abortions TAB SAB  Ect Mult Living                 Review of Systems  Respiratory: Negative for shortness of breath.   Cardiovascular: Positive for palpitations.  Psychiatric/Behavioral: The patient is nervous/anxious.    Allergies  Metoprolol and Sulfa antibiotics  Home Medications   Prior to Admission medications   Medication Sig Start Date End Date Taking? Authorizing Provider  ALPRAZolam Duanne Moron) 0.5 MG tablet Take 0.5 mg by mouth 3 (three) times daily as needed for sleep.    Historical Provider, MD  calcium-vitamin D (OSCAL) 250-125 MG-UNIT per tablet Take 1 tablet by mouth once a week.    Historical Provider, MD  DEXILANT 60 MG capsule TAKE ONE CAPSULE BY MOUTH 1 TIME A DAY 30 MINUTES BEFORE BREAKFAST 03/06/13   Rogene Houston, MD  DEXILANT 60 MG capsule TAKE ONE CAPSULE 30 MINUTES BEFORE BREAKFAST 05/16/14   Butch Penny, NP  fluticasone (FLONASE) 50 MCG/ACT nasal spray Place 2 sprays into the nose daily.    Historical Provider, MD  furosemide (LASIX) 40 MG tablet Take 40 mg by mouth daily.    Historical Provider, MD  HYDROcodone-acetaminophen (VICODIN) 5-500 MG per tablet Take 1 tablet by mouth every 6 (six) hours as needed for pain.    Historical Provider, MD  losartan (COZAAR) 100 MG  tablet Take 100 mg by mouth daily.    Historical Provider, MD  medroxyPROGESTERone (PROVERA) 2.5 MG tablet TAKE 1 TABLET BY MOUTH EVERY DAY 10/13/13   Florian Buff, MD  meloxicam (MOBIC) 15 MG tablet Take 15 mg by mouth daily as needed for pain.    Historical Provider, MD  NEOMYCIN-POLYMYXIN-HC, OTIC, (CORTISPORIN) 1 % SOLN every 6 (six) hours.    Historical Provider, MD  potassium chloride (K-DUR,KLOR-CON) 10 MEQ tablet Take 10 mEq by mouth daily.    Historical Provider, MD  traMADol (ULTRAM) 50 MG tablet Take 50 mg by mouth every 6 (six) hours as needed for pain.    Historical Provider, MD  verapamil (CALAN-SR) 180 MG CR tablet Take 1 tablet (180 mg total) by mouth 2 (two) times daily. PLEASE SCHEDULE AN  APPOINTMENT 05/20/13   Pixie Casino, MD   BP 175/91  Pulse 99  Temp(Src) 97.9 F (36.6 C) (Oral)  Resp 20  Ht 5\' 7"  (1.702 m)  Wt 296 lb (134.265 kg)  BMI 46.35 kg/m2  SpO2 99% Physical Exam  Constitutional: She is oriented to person, place, and time. She appears well-developed and well-nourished. No distress.  HENT:  Head: Normocephalic and atraumatic.  Right Ear: Hearing normal.  Left Ear: Hearing normal.  Nose: Nose normal.  Mouth/Throat: Oropharynx is clear and moist and mucous membranes are normal.  Eyes: Conjunctivae and EOM are normal. Pupils are equal, round, and reactive to light.  Neck: Normal range of motion. Neck supple.  Cardiovascular: Regular rhythm, S1 normal and S2 normal.  Exam reveals no gallop and no friction rub.   No murmur heard. Pulmonary/Chest: Effort normal and breath sounds normal. No respiratory distress. She exhibits no tenderness.  Abdominal: Soft. Normal appearance and bowel sounds are normal. There is no hepatosplenomegaly. There is no tenderness. There is no rebound, no guarding, no tenderness at McBurney's point and negative Murphy's sign. No hernia.  Musculoskeletal: Normal range of motion.  Neurological: She is alert and oriented to person, place, and time. She has normal strength. No cranial nerve deficit or sensory deficit. Coordination normal. GCS eye subscore is 4. GCS verbal subscore is 5. GCS motor subscore is 6.  Skin: Skin is warm, dry and intact. No rash noted. No cyanosis.  Psychiatric: She has a normal mood and affect. Her speech is normal and behavior is normal. Thought content normal.    ED Course  Procedures Labs Review Labs Reviewed - No data to display  Imaging Review No results found.   EKG Interpretation None       Date: 06/09/2014  Rate: 86  Rhythm: normal sinus rhythm and premature ventricular contractions (PVC)  QRS Axis: normal  Intervals: normal  ST/T Wave abnormalities: normal  Conduction  Disutrbances:none  Narrative Interpretation:   Old EKG Reviewed: unchanged    MDM   Final diagnoses:  None   heart palpitations, PVCs   Patient presents to the ER for evaluation of her palpitations. Patient admits to increased intake of caffeine in the last few days. She has a history of PVCs. She does not tolerate Lopressor, has been on verapamil which she is taking. She has been symptomatic uterine ER. She has occasional fluttering that coincides with PVCs on the monitor. No other arrhythmia noted. CBC, electrolytes, troponin normal. Patient reassured, should avoid caffeine and take her normal medications. She has some significant anxiety as well, can take her Xanax as needed for this.  I personally performed the services described in this documentation,  which was scribed in my presence. The recorded information has been reviewed and is accurate.      Orpah Greek, MD 06/09/14 [place], MD 06/09/14 [phone]

## 2014-06-09 NOTE — ED Notes (Signed)
Feels "fluttering sensation in chest" x 2 days

## 2014-06-09 NOTE — ED Notes (Signed)
MD at bedside. 

## 2014-06-09 NOTE — ED Notes (Signed)
Pt states drank soda and shouldn't be drinking soda. States when she drinks soda becomes gassy. Pt states feels "bubblly" in stomach and when it acts up also feels fluttering in chest. States had fluttering before and had to wear holter monitor for it.

## 2014-06-09 NOTE — Discharge Instructions (Signed)
Palpitations  A palpitation is the feeling that your heartbeat is irregular. It may feel like your heart is fluttering or skipping a beat. It may also feel like your heart is beating faster than normal. This is usually not a serious problem. In some cases, you may need more medical tests. HOME CARE  Avoid:  Caffeine in coffee, tea, soft drinks, diet pills, and energy drinks.  Chocolate.  Alcohol.  Stop smoking if you smoke.  Reduce your stress and anxiety. Try:  A method that measures bodily functions so you can learn to control them (biofeedback).  Yoga.  Meditation.  Physical activity such as swimming, jogging, or walking.  Get plenty of rest and sleep. GET HELP RIGHT AWAY IF:   You have chest pain.  You feel short of breath.  You have a very bad headache.  You feel dizzy or pass out (faint).  Your fast or irregular heartbeat continues after 24 hours.  Your palpitations occur more often. MAKE SURE YOU:   Understand these instructions.  Will watch your condition.  Will get help right away if you are not doing well or get worse. Document Released: 09/15/2008 Document Revised: 06/07/2012 Document Reviewed: 02/05/2012 [facility] Patient Information 2015 [place], [place]. This information is not intended to replace advice given to you by your health care provider. Make sure you discuss any questions you have with your health care provider.  Premature Ventricular Contraction Premature ventricular contraction (PVC) is an irregularity of the heart rhythm involving extra or skipped heartbeats. In some cases, they may occur without obvious cause or heart disease. Other times, they can be caused by an electrolyte change in the blood. These need to be corrected. They can also be seen when there is not enough oxygen going to the heart. A common cause of this is plaque or cholesterol buildup. This buildup decreases the blood supply to the heart. In addition, extra beats may be  caused or aggravated by:  Excessive smoking.  Alcohol consumption.  Caffeine.  Certain medications  Some street drugs. SYMPTOMS   The sensation of feeling your heart skipping a beat (palpitations).  In many cases, the person may have no symptoms. SIGNS AND TESTS   A physical examination may show an occasional irregularity, but if the PVC beats do not happen often, they may not be found on physical exam.  Blood pressure is usually normal.  Other tests that may find extra beats of the heart are:  An EKG (electrocardiogram)  A Holter monitor which can monitor your heart over longer periods of time  An Angiogram (study of the heart arteries). TREATMENT  Usually extra heartbeats do not need treatment. The condition is treated only if symptoms are severe or if extra beats are very frequent or are causing problems. An underlying cause, if discovered, may also require treatment.  Treatment may also be needed if there may be a risk for other more serious cardiac arrhythmias.  PREVENTION   Moderation in caffeine, alcohol, and tobacco use may reduce the risk of ectopic heartbeats in some people.  Exercise often helps people who lead a sedentary (inactive) lifestyle. PROGNOSIS  PVC heartbeats are generally harmless and do not need treatment.  RISKS AND COMPLICATIONS   Ventricular tachycardia (occasionally).  There usually are no complications.  Other arrhythmias (occasionally). SEEK IMMEDIATE MEDICAL CARE IF:   You feel palpitations that are frequent or continual.  You develop chest pain or other problems such as shortness of breath, sweating, or nausea and vomiting.  You become light-headed or faint (pass out).  You get worse or do not improve with treatment. Document Released: 07/24/2004 Document Revised: 02/29/2012 Document Reviewed: 02/03/2008 [facility] Patient Information 2015 [place], [place]. This information is not intended to replace advice given to you by your  health care provider. Make sure you discuss any questions you have with your health care provider.

## 2014-06-26 DIAGNOSIS — N390 Urinary tract infection, site not specified: Secondary | ICD-10-CM | POA: Diagnosis not present

## 2014-06-26 DIAGNOSIS — I10 Essential (primary) hypertension: Secondary | ICD-10-CM | POA: Diagnosis not present

## 2014-06-26 DIAGNOSIS — E669 Obesity, unspecified: Secondary | ICD-10-CM | POA: Diagnosis not present

## 2014-06-26 DIAGNOSIS — E559 Vitamin D deficiency, unspecified: Secondary | ICD-10-CM | POA: Diagnosis not present

## 2014-06-26 DIAGNOSIS — Z6841 Body Mass Index (BMI) 40.0 and over, adult: Secondary | ICD-10-CM | POA: Diagnosis not present

## 2014-11-12 ENCOUNTER — Other Ambulatory Visit (HOSPITAL_COMMUNITY)
Admission: RE | Admit: 2014-11-12 | Discharge: 2014-11-12 | Disposition: A | Discharge status: Home / Self Care | Payer: Medicare Other | Source: Ambulatory Visit | Attending: Obstetrics & Gynecology | Admitting: Obstetrics & Gynecology

## 2014-11-12 ENCOUNTER — Encounter: Payer: Self-pay | Admitting: Obstetrics & Gynecology

## 2014-11-12 ENCOUNTER — Ambulatory Visit (INDEPENDENT_AMBULATORY_CARE_PROVIDER_SITE_OTHER): Payer: Medicare Other | Admitting: Obstetrics & Gynecology

## 2014-11-12 VITALS — BP 140/80 | Ht 67.0 in | Wt 308.0 lb

## 2014-11-12 DIAGNOSIS — Z1151 Encounter for screening for human papillomavirus (HPV): Secondary | ICD-10-CM | POA: Insufficient documentation

## 2014-11-12 DIAGNOSIS — R87810 Cervical high risk human papillomavirus (HPV) DNA test positive: Secondary | ICD-10-CM | POA: Diagnosis not present

## 2014-11-12 DIAGNOSIS — Z1212 Encounter for screening for malignant neoplasm of rectum: Secondary | ICD-10-CM | POA: Diagnosis not present

## 2014-11-12 DIAGNOSIS — Z124 Encounter for screening for malignant neoplasm of cervix: Secondary | ICD-10-CM | POA: Diagnosis not present

## 2014-11-12 DIAGNOSIS — Z01419 Encounter for gynecological examination (general) (routine) without abnormal findings: Secondary | ICD-10-CM

## 2014-11-12 DIAGNOSIS — Z1211 Encounter for screening for malignant neoplasm of colon: Secondary | ICD-10-CM

## 2014-11-12 LAB — HEMOCCULT GUIAC POC 1CARD (OFFICE): FECAL OCCULT BLD: NEGATIVE

## 2014-11-12 MED ORDER — MEDROXYPROGESTERONE ACETATE 2.5 MG PO TABS: 2.5000 mg | ORAL_TABLET | Freq: Every day | ORAL | Status: DC

## 2014-11-12 MED ORDER — NYSTATIN-TRIAMCINOLONE 100000-0.1 UNIT/GM-% EX OINT: 1.0000 "application " | TOPICAL_OINTMENT | Freq: Two times a day (BID) | CUTANEOUS | Status: DC

## 2014-11-12 NOTE — Addendum Note (Signed)
Addended by: Florian Buff on: 11/12/2014 10:53 AM   Modules accepted: Orders

## 2014-11-12 NOTE — Addendum Note (Signed)
Addended by: Doyne Keel on: 11/12/2014 12:12 PM   Modules accepted: Orders

## 2014-11-12 NOTE — Progress Notes (Signed)
Patient ID: ARDEL Oneal, female   DOB: 03/10/1956, 58 y.o.   MRN: [id number] Subjective:     Megan Oneal is a 58 y.o. female here for a routine exam.  No LMP recorded. Patient is postmenopausal. No obstetric history on file. Birth Control Method:  na Menstrual Calendar(currently): amenorrheic  Current complaints: none.   Current acute medical issues:  none   Recent Gynecologic History No LMP recorded. Patient is postmenopausal. Last Pap: 2013,  normal Last mammogram: 2014,  normal  Past Medical History  Diagnosis Date  . Hypertension   . Otalgia, unspecified   . Degenerative disc disease, lumbar     Epidural injections in 2007  . Endometriosis   . Vascular bruit 01/2013    Right neck  . Anxiety 03/12/2013  . Palpitations 10/04/2010  . Frequent PVCs     12,000/24 hours    Past Surgical History  Procedure Laterality Date  . Lumbar laminectomy  2009     L4-L5 and L5-S1 laminectomy with diskectomy; transforaminal  . Laparoscopic cholecystectomy  2010  . Tubal ligation    . Breast surgery      benign tumor excised-age 14  . Cyst excision  2007    Scalp  . Colonoscopy  2009  . Total knee arthroplasty Bilateral   . Replacement total knee      OB History    No data available      History   Social History  . Marital Status: Divorced    Spouse Name: N/A    Number of Children: 3  . Years of Education: N/A   Social History Main Topics  . Smoking status: Former Smoker -- 2.00 packs/day for 20 years    Types: Cigarettes  . Smokeless tobacco: Former Systems developer    Quit date: 12/21/1994  . Alcohol Use: No  . Drug Use: No  . Sexual Activity: None   Other Topics Concern  . None   Social History Narrative    Family History  Problem Relation Age of Onset  . Diabetes Mother   . Diabetes Brother     Current outpatient prescriptions: ALPRAZolam (XANAX) 0.5 MG tablet, Take 0.5 mg by mouth 3 (three) times daily as needed for anxiety or sleep. , Disp: , Rfl: ;  aspirin 81  MG tablet, Take 81 mg by mouth daily., Disp: , Rfl: ;  dexlansoprazole (DEXILANT) 60 MG capsule, Take 60 mg by mouth daily., Disp: , Rfl: ;  ferrous sulfate 325 (65 FE) MG tablet, Take 325 mg by mouth 2 (two) times daily with a meal., Disp: , Rfl:  fluticasone (FLONASE) 50 MCG/ACT nasal spray, Place 2 sprays into the nose daily., Disp: , Rfl: ;  furosemide (LASIX) 40 MG tablet, Take 40 mg by mouth daily., Disp: , Rfl: ;  losartan (COZAAR) 100 MG tablet, Take 100 mg by mouth daily., Disp: , Rfl: ;  medroxyPROGESTERone (PROVERA) 2.5 MG tablet, Take 2.5 mg by mouth daily., Disp: , Rfl: ;  potassium chloride (K-DUR,KLOR-CON) 10 MEQ tablet, Take 10 mEq by mouth daily., Disp: , Rfl:  verapamil (CALAN-SR) 180 MG CR tablet, Take 180 mg by mouth 2 (two) times daily., Disp: , Rfl: ;  Vitamin D, Ergocalciferol, (DRISDOL) 50000 UNITS CAPS capsule, Take 50,000 Units by mouth every 7 (seven) days., Disp: , Rfl: ;  medroxyPROGESTERone (PROVERA) 2.5 MG tablet, Take 2.5 mg by mouth daily., Disp: , Rfl: ;  meloxicam (MOBIC) 15 MG tablet, Take 15 mg by mouth daily as needed for  pain., Disp: , Rfl:   Review of Systems  Review of Systems  Constitutional: Negative for fever, chills, weight loss, malaise/fatigue and diaphoresis.  HENT: Negative for hearing loss, ear pain, nosebleeds, congestion, sore throat, neck pain, tinnitus and ear discharge.   Eyes: Negative for blurred vision, double vision, photophobia, pain, discharge and redness.  Respiratory: Negative for cough, hemoptysis, sputum production, shortness of breath, wheezing and stridor.   Cardiovascular: Negative for chest pain, palpitations, orthopnea, claudication, leg swelling and PND.  Gastrointestinal: negative for abdominal pain. Negative for heartburn, nausea, vomiting, diarrhea, constipation, blood in stool and melena.  Genitourinary: Negative for dysuria, urgency, frequency, hematuria and flank pain.  Musculoskeletal: Negative for myalgias, back pain, joint  pain and falls.  Skin: Negative for itching and rash.  Neurological: Negative for dizziness, tingling, tremors, sensory change, speech change, focal weakness, seizures, loss of consciousness, weakness and headaches.  Endo/Heme/Allergies: Negative for environmental allergies and polydipsia. Does not bruise/bleed easily.  Psychiatric/Behavioral: Negative for depression, suicidal ideas, hallucinations, memory loss and substance abuse. The patient is not nervous/anxious and does not have insomnia.        Objective:  Blood pressure 140/80, height 5\' 7"  (1.702 m), weight 308 lb (139.708 kg).   Physical Exam  Vitals reviewed. Constitutional: She is oriented to person, place, and time. She appears well-developed and well-nourished.  HENT:  Head: Normocephalic and atraumatic.        Right Ear: External ear normal.  Left Ear: External ear normal.  Nose: Nose normal.  Mouth/Throat: Oropharynx is clear and moist.  Eyes: Conjunctivae and EOM are normal. Pupils are equal, round, and reactive to light. Right eye exhibits no discharge. Left eye exhibits no discharge. No scleral icterus.  Neck: Normal range of motion. Neck supple. No tracheal deviation present. No thyromegaly present.  Cardiovascular: Normal rate, regular rhythm, normal heart sounds and intact distal pulses.  Exam reveals no gallop and no friction rub.   No murmur heard. Respiratory: Effort normal and breath sounds normal. No respiratory distress. She has no wheezes. She has no rales. She exhibits no tenderness.  GI: Soft. Bowel sounds are normal. She exhibits no distension and no mass. There is no tenderness. There is no rebound and no guarding.  Genitourinary:  Breasts no masses skin changes or nipple changes bilaterally      Vulva is normal without lesions Vagina is pink moist without discharge Cervix normal in appearance and pap is done Uterus is normal size shape and contour Adnexa is negative with normal sized ovaries  Rectal     hemoccult negative, normal tone, no masses  Musculoskeletal: Normal range of motion. She exhibits no edema and no tenderness.  Neurological: She is alert and oriented to person, place, and time. She has normal reflexes. She displays normal reflexes. No cranial nerve deficit. She exhibits normal muscle tone. Coordination normal.  Skin: Skin is warm and dry. No rash noted. No erythema. No pallor.  Psychiatric: She has a normal mood and affect. Her behavior is normal. Judgment and thought content normal.       Assessment:    Healthy female exam.    Plan:    Mammogram ordered. Follow up in: 1 year. 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

## 2014-11-13 LAB — CYTOLOGY - PAP

## 2014-11-23 DIAGNOSIS — F329 Major depressive disorder, single episode, unspecified: Secondary | ICD-10-CM | POA: Diagnosis not present

## 2014-11-23 DIAGNOSIS — M545 Low back pain: Secondary | ICD-10-CM | POA: Diagnosis not present

## 2014-11-23 DIAGNOSIS — Z6841 Body Mass Index (BMI) 40.0 and over, adult: Secondary | ICD-10-CM | POA: Diagnosis not present

## 2014-11-23 DIAGNOSIS — M19019 Primary osteoarthritis, unspecified shoulder: Secondary | ICD-10-CM | POA: Diagnosis not present

## 2014-11-26 ENCOUNTER — Other Ambulatory Visit: Payer: Self-pay | Admitting: Obstetrics & Gynecology

## 2014-11-26 DIAGNOSIS — Z1231 Encounter for screening mammogram for malignant neoplasm of breast: Secondary | ICD-10-CM

## 2014-11-29 ENCOUNTER — Ambulatory Visit (HOSPITAL_COMMUNITY)
Admission: RE | Admit: 2014-11-29 | Discharge: 2014-11-29 | Disposition: A | Discharge status: Home / Self Care | Payer: Medicare Other | Source: Ambulatory Visit | Attending: Obstetrics & Gynecology | Admitting: Obstetrics & Gynecology

## 2014-11-29 DIAGNOSIS — Z1231 Encounter for screening mammogram for malignant neoplasm of breast: Secondary | ICD-10-CM | POA: Diagnosis not present

## 2014-12-24 DIAGNOSIS — Z96651 Presence of right artificial knee joint: Secondary | ICD-10-CM | POA: Diagnosis not present

## 2014-12-24 DIAGNOSIS — M25561 Pain in right knee: Secondary | ICD-10-CM | POA: Diagnosis not present

## 2014-12-31 ENCOUNTER — Ambulatory Visit (INDEPENDENT_AMBULATORY_CARE_PROVIDER_SITE_OTHER): Payer: Medicare Other | Admitting: Obstetrics & Gynecology

## 2014-12-31 ENCOUNTER — Encounter: Payer: Self-pay | Admitting: Obstetrics & Gynecology

## 2014-12-31 VITALS — BP 148/80 | Wt 315.0 lb

## 2014-12-31 DIAGNOSIS — N760 Acute vaginitis: Secondary | ICD-10-CM

## 2014-12-31 DIAGNOSIS — A499 Bacterial infection, unspecified: Secondary | ICD-10-CM

## 2014-12-31 DIAGNOSIS — B9689 Other specified bacterial agents as the cause of diseases classified elsewhere: Secondary | ICD-10-CM

## 2014-12-31 MED ORDER — METRONIDAZOLE 500 MG PO TABS: 500.0000 mg | ORAL_TABLET | Freq: Two times a day (BID) | ORAL | Status: DC

## 2014-12-31 NOTE — Progress Notes (Signed)
Patient ID: Megan Oneal, female   DOB: [birth year], 57 y.o.   MRN: [id number] Chief Complaint  Patient presents with  . possible yeast infection    HPI Pt states she has vaginal discharge since restarting having sexual intercourse Irritated no odor  ROS No burning with urination, frequency or urgency No nausea, vomiting or diarrhea Nor fever chills or other constitutional symptoms   Blood pressure 148/80, weight 315 lb (142.883 kg).  EXAM Abdomen:       Vulva:            normal appearing vulva with no masses, tenderness or lesions Vagina:          normal mucosa, thin grey discharge Cervix:           normal appearance Uterus:          uterus is normal size, shape, consistency and nontender Adnexa:          Rectal:            Hemoccult:                               Assessment/Plan:  BV: metrogel qhs x 7

## 2015-02-07 ENCOUNTER — Emergency Department (HOSPITAL_COMMUNITY)
Admission: EM | Admit: 2015-02-07 | Discharge: 2015-02-07 | Disposition: A | Discharge status: Home / Self Care | Payer: Medicare Other | Attending: Emergency Medicine | Admitting: Emergency Medicine

## 2015-02-07 ENCOUNTER — Encounter (HOSPITAL_COMMUNITY): Payer: Self-pay

## 2015-02-07 DIAGNOSIS — F419 Anxiety disorder, unspecified: Secondary | ICD-10-CM | POA: Diagnosis not present

## 2015-02-07 DIAGNOSIS — Z7951 Long term (current) use of inhaled steroids: Secondary | ICD-10-CM | POA: Insufficient documentation

## 2015-02-07 DIAGNOSIS — Z79899 Other long term (current) drug therapy: Secondary | ICD-10-CM | POA: Diagnosis not present

## 2015-02-07 DIAGNOSIS — Z87891 Personal history of nicotine dependence: Secondary | ICD-10-CM | POA: Insufficient documentation

## 2015-02-07 DIAGNOSIS — R040 Epistaxis: Secondary | ICD-10-CM | POA: Insufficient documentation

## 2015-02-07 DIAGNOSIS — I10 Essential (primary) hypertension: Secondary | ICD-10-CM | POA: Insufficient documentation

## 2015-02-07 DIAGNOSIS — Z8742 Personal history of other diseases of the female genital tract: Secondary | ICD-10-CM | POA: Insufficient documentation

## 2015-02-07 DIAGNOSIS — Z7982 Long term (current) use of aspirin: Secondary | ICD-10-CM | POA: Insufficient documentation

## 2015-02-07 DIAGNOSIS — R0981 Nasal congestion: Secondary | ICD-10-CM | POA: Diagnosis present

## 2015-02-07 DIAGNOSIS — Z792 Long term (current) use of antibiotics: Secondary | ICD-10-CM | POA: Insufficient documentation

## 2015-02-07 DIAGNOSIS — Z8739 Personal history of other diseases of the musculoskeletal system and connective tissue: Secondary | ICD-10-CM | POA: Diagnosis not present

## 2015-02-07 DIAGNOSIS — J0100 Acute maxillary sinusitis, unspecified: Secondary | ICD-10-CM

## 2015-02-07 MED ORDER — AMOXICILLIN 500 MG PO CAPS: 500.0000 mg | ORAL_CAPSULE | Freq: Three times a day (TID) | ORAL | Status: AC

## 2015-02-07 MED ORDER — BENZONATATE 100 MG PO CAPS: 200.0000 mg | ORAL_CAPSULE | Freq: Three times a day (TID) | ORAL | Status: DC | PRN

## 2015-02-07 NOTE — ED Notes (Signed)
Pt reports cough, congestion, sinus pressure since Tuesday.  Reports sneezed today and had a nose bleed.  Bleeding subsided at this time.

## 2015-02-07 NOTE — Discharge Instructions (Signed)
Nosebleed Nosebleeds can be caused by many conditions, including trauma, infections, polyps, foreign bodies, dry mucous membranes or climate, medicines, and air conditioning. Most nosebleeds occur in the front of the nose. Because of this location, most nosebleeds can be controlled by pinching the nostrils gently and continuously for at least 10 to 20 minutes. The long, continuous pressure allows enough time for the blood to clot. If pressure is released during that 10 to 20 minute time period, the process may have to be started again. The nosebleed may stop by itself or quit with pressure, or it may need concentrated heating (cautery) or pressure from packing. HOME CARE INSTRUCTIONS   If your nose was packed, try to maintain the pack inside until your health care provider removes it. If a gauze pack was used and it starts to fall out, gently replace it or cut the end off. Do not cut if a balloon catheter was used to pack the nose. Otherwise, do not remove unless instructed.  Avoid blowing your nose for 12 hours after treatment. This could dislodge the pack or clot and start the bleeding again.  If the bleeding starts again, sit up and bend forward, gently pinching the front half of your nose continuously for 20 minutes.  If bleeding was caused by dry mucous membranes, use over-the-counter saline nasal spray or gel. This will keep the mucous membranes moist and allow them to heal. If you must use a lubricant, choose the water-soluble variety. Use it only sparingly and not within several hours of lying down.  Do not use petroleum jelly or mineral oil, as these may drip into the lungs and cause serious problems.  Maintain humidity in your home by using less air conditioning or by using a humidifier.  Do not use aspirin or medicines which make bleeding more likely. Your health care provider can give you recommendations on this.  Resume normal activities as you are able, but try to avoid straining,  lifting, or bending at the waist for several days.  If the nosebleeds become recurrent and the cause is unknown, your health care provider may suggest laboratory tests. SEEK MEDICAL CARE IF: You have a fever. SEEK IMMEDIATE MEDICAL CARE IF:   Bleeding recurs and cannot be controlled.  There is unusual bleeding from or bruising on other parts of the body.  Nosebleeds continue.  There is any worsening of the condition which originally brought you in.  You become light-headed, feel faint, become sweaty, or vomit blood. MAKE SURE YOU:   Understand these instructions.  Will watch your condition.  Will get help right away if you are not doing well or get worse. Document Released: 09/16/2005 Document Revised: 04/23/2014 Document Reviewed: 11/07/2009 [facility] Patient Information 2015 [place], [place]. This information is not intended to replace advice given to you by your health care provider. Make sure you discuss any questions you have with your health care provider.  Sinusitis Sinusitis is redness, soreness, and inflammation of the paranasal sinuses. Paranasal sinuses are air pockets within the bones of your face (beneath the eyes, the middle of the forehead, or above the eyes). In healthy paranasal sinuses, mucus is able to drain out, and air is able to circulate through them by way of your nose. However, when your paranasal sinuses are inflamed, mucus and air can become trapped. This can allow bacteria and other germs to grow and cause infection. Sinusitis can develop quickly and last only a short time (acute) or continue over a long period (chronic).  Sinusitis that lasts for more than 12 weeks is considered chronic.  CAUSES  Causes of sinusitis include:  Allergies.  Structural abnormalities, such as displacement of the cartilage that separates your nostrils (deviated septum), which can decrease the air flow through your nose and sinuses and affect sinus drainage.  Functional  abnormalities, such as when the small hairs (cilia) that line your sinuses and help remove mucus do not work properly or are not present. SIGNS AND SYMPTOMS  Symptoms of acute and chronic sinusitis are the same. The primary symptoms are pain and pressure around the affected sinuses. Other symptoms include:  Upper toothache.  Earache.  Headache.  Bad breath.  Decreased sense of smell and taste.  A cough, which worsens when you are lying flat.  Fatigue.  Fever.  Thick drainage from your nose, which often is green and may contain pus (purulent).  Swelling and warmth over the affected sinuses. DIAGNOSIS  Your health care provider will perform a physical exam. During the exam, your health care provider may:  Look in your nose for signs of abnormal growths in your nostrils (nasal polyps).  Tap over the affected sinus to check for signs of infection.  View the inside of your sinuses (endoscopy) using an imaging device that has a light attached (endoscope). If your health care provider suspects that you have chronic sinusitis, one or more of the following tests may be recommended:  Allergy tests.  Nasal culture. A sample of mucus is taken from your nose, sent to a lab, and screened for bacteria.  Nasal cytology. A sample of mucus is taken from your nose and examined by your health care provider to determine if your sinusitis is related to an allergy. TREATMENT  Most cases of acute sinusitis are related to a viral infection and will resolve on their own within 10 days. Sometimes medicines are prescribed to help relieve symptoms (pain medicine, decongestants, nasal steroid sprays, or saline sprays).  However, for sinusitis related to a bacterial infection, your health care provider will prescribe antibiotic medicines. These are medicines that will help kill the bacteria causing the infection.  Rarely, sinusitis is caused by a fungal infection. In theses cases, your health care provider  will prescribe antifungal medicine. For some cases of chronic sinusitis, surgery is needed. Generally, these are cases in which sinusitis recurs more than 3 times per year, despite other treatments. HOME CARE INSTRUCTIONS   Drink plenty of water. Water helps thin the mucus so your sinuses can drain more easily.  Use a humidifier.  Inhale steam 3 to 4 times a day (for example, sit in the bathroom with the shower running).  Apply a warm, moist washcloth to your face 3 to 4 times a day, or as directed by your health care provider.  Use saline nasal sprays to help moisten and clean your sinuses.  Take medicines only as directed by your health care provider.  If you were prescribed either an antibiotic or antifungal medicine, finish it all even if you start to feel better. SEEK IMMEDIATE MEDICAL CARE IF:  You have increasing pain or severe headaches.  You have nausea, vomiting, or drowsiness.  You have swelling around your face.  You have vision problems.  You have a stiff neck.  You have difficulty breathing. MAKE SURE YOU:   Understand these instructions.  Will watch your condition.  Will get help right away if you are not doing well or get worse. Document Released: 12/07/2005 Document Revised: 04/23/2014 Document Reviewed:  12/22/2011 [employer] Patient Information 2015 [place] [facility] [place], [place]. This information is not intended to replace advice given to you by your health care provider. Make sure you discuss any questions you have with your health care provider.

## 2015-02-07 NOTE — ED Notes (Signed)
nad noted prior to dc. Dc instructions reviewed and explained. Voiced understanding. 2 Rx given. Ambulated out without difficulty.

## 2015-02-09 NOTE — ED Provider Notes (Signed)
CSN: [id number]     Arrival date & time 02/07/15  1049 History   First MD Initiated Contact with Patient 02/07/15 1157     Chief Complaint  Patient presents with  . URI     (Consider location/radiation/quality/duration/timing/severity/associated sxs/prior Treatment) Patient is a 59 y.o. female presenting with URI. The history is provided by the patient.  URI Presenting symptoms: congestion, cough, facial pain and fever   Congestion:    Location:  Nasal   Interferes with sleep: no     Interferes with eating/drinking: yes (also had a nose bleed this am before arrival triggered by sneezing.  It resolved after a few minutes of holding pressure.)   Cough:    Cough characteristics:  Non-productive   Sputum characteristics:  Green and bloody   Severity:  Moderate   Onset quality:  Gradual   Duration:  2 days   Progression:  Worsening   Chronicity:  New Fever:    Duration:  2 days   Timing:  Intermittent   Temp source:  Subjective Severity:  Moderate Duration:  2 days Progression:  Worsening Chronicity:  New Relieved by:  None tried Worsened by:  Nothing tried Ineffective treatments:  None tried Associated symptoms: sinus pain and sneezing   Associated symptoms: no wheezing     Past Medical History  Diagnosis Date  . Hypertension   . Otalgia, unspecified   . Degenerative disc disease, lumbar     Epidural injections in 2007  . Endometriosis   . Vascular bruit 01/2013    Right neck  . Anxiety 03/12/2013  . Palpitations 10/04/2010  . Frequent PVCs     12,000/24 hours   Past Surgical History  Procedure Laterality Date  . Lumbar laminectomy  2009     L4-L5 and L5-S1 laminectomy with diskectomy; transforaminal  . Laparoscopic cholecystectomy  2010  . Tubal ligation    . Breast surgery      benign tumor excised-age 73  . Cyst excision  2007    Scalp  . Colonoscopy  2009  . Total knee arthroplasty Bilateral   . Replacement total knee     Family History  Problem  Relation Age of Onset  . Diabetes Mother   . Hypertension Mother   . Diabetes Brother   . Hypertension Brother    History  Substance Use Topics  . Smoking status: Former Smoker -- 2.00 packs/day for 20 years    Types: Cigarettes  . Smokeless tobacco: Former Systems developer    Quit date: 12/21/1994  . Alcohol Use: No   OB History    No data available     Review of Systems  Constitutional: Positive for fever.  HENT: Positive for congestion, nosebleeds, sinus pressure and sneezing.   Respiratory: Positive for cough. Negative for shortness of breath and wheezing.       Allergies  Metoprolol and Sulfa antibiotics  Home Medications   Prior to Admission medications   Medication Sig Start Date End Date Taking? Authorizing Provider  ALPRAZolam Duanne Moron) 0.5 MG tablet Take 0.5 mg by mouth 3 (three) times daily as needed for anxiety or sleep.     Historical Provider, MD  amoxicillin (AMOXIL) 500 MG capsule Take 1 capsule (500 mg total) by mouth 3 (three) times daily. 02/07/15 02/17/15  Evalee Jefferson, PA-C  aspirin 81 MG tablet Take 81 mg by mouth daily.    Historical Provider, MD  benzonatate (TESSALON) 100 MG capsule Take 2 capsules (200 mg total) by mouth 3 (three) times  daily as needed for cough. 02/07/15   Evalee Jefferson, PA-C  dexlansoprazole (DEXILANT) 60 MG capsule Take 60 mg by mouth daily.    Historical Provider, MD  escitalopram (LEXAPRO) 10 MG tablet Take 10 mg by mouth daily. 11/23/14   Historical Provider, MD  etodolac (LODINE) 500 MG tablet  12/24/14   Historical Provider, MD  ferrous sulfate 325 (65 FE) MG tablet Take 325 mg by mouth 2 (two) times daily with a meal.    Historical Provider, MD  fluticasone (FLONASE) 50 MCG/ACT nasal spray Place 2 sprays into the nose daily.    Historical Provider, MD  furosemide (LASIX) 40 MG tablet Take 40 mg by mouth daily.    Historical Provider, MD  HYDROcodone-acetaminophen (NORCO/VICODIN) 5-325 MG per tablet Take 1 tablet by mouth every 4 (four) hours as  needed.  11/23/14   Historical Provider, MD  losartan (COZAAR) 100 MG tablet Take 100 mg by mouth daily.    Historical Provider, MD  Meclizine HCl 25 MG CHEW Chew 1 tablet by mouth as needed.  11/23/14   Historical Provider, MD  medroxyPROGESTERone (PROVERA) 2.5 MG tablet Take 2.5 mg by mouth daily.    Historical Provider, MD  medroxyPROGESTERone (PROVERA) 2.5 MG tablet Take 1 tablet (2.5 mg total) by mouth daily. 11/12/14   Florian Buff, MD  meloxicam (MOBIC) 15 MG tablet Take 15 mg by mouth daily as needed for pain.    Historical Provider, MD  metroNIDAZOLE (FLAGYL) 500 MG tablet Take 1 tablet (500 mg total) by mouth 2 (two) times daily. 12/31/14   Florian Buff, MD  nystatin-triamcinolone ointment [facility]) Apply 1 application topically 2 (two) times daily. Patient not taking: Reported on 12/31/2014 11/12/14   Florian Buff, MD  potassium chloride (K-DUR,KLOR-CON) 10 MEQ tablet Take 10 mEq by mouth daily.    Historical Provider, MD  traMADol (ULTRAM) 50 MG tablet Take 50 mg by mouth every 6 (six) hours as needed.  11/23/14   Historical Provider, MD  verapamil (CALAN-SR) 180 MG CR tablet Take 180 mg by mouth 2 (two) times daily.    Historical Provider, MD  Vitamin D, Ergocalciferol, (DRISDOL) 50000 UNITS CAPS capsule Take 50,000 Units by mouth every 7 (seven) days.    Historical Provider, MD   BP 148/92 mmHg  Pulse 87  Temp(Src) 98.3 F (36.8 C) (Oral)  Resp 18  Ht 5\' 6"  (1.676 m)  Wt 308 lb (139.708 kg)  BMI 49.74 kg/m2  SpO2 98% Physical Exam  Constitutional: She is oriented to person, place, and time. She appears well-developed and well-nourished.  HENT:  Head: Normocephalic and atraumatic.  Right Ear: Tympanic membrane and ear canal normal.  Left Ear: Tympanic membrane and ear canal normal.  Nose: Mucosal edema and rhinorrhea present. Epistaxis is observed. Right sinus exhibits maxillary sinus tenderness. Left sinus exhibits maxillary sinus tenderness.  Mouth/Throat: Uvula is midline,  oropharynx is clear and moist and mucous membranes are normal. No oropharyngeal exudate, posterior oropharyngeal edema, posterior oropharyngeal erythema or tonsillar abscesses.  Trace of dried blood in right nostril. No active bleeding.  Punctate superficial blood vessel on inferior turbinate that may have recently bled. No PND. Purulent nasal discharge present.  Eyes: Conjunctivae are normal.  Cardiovascular: Normal rate and normal heart sounds.   Pulmonary/Chest: Effort normal. No respiratory distress. She has no wheezes. She has no rales.  Abdominal: Soft. There is no tenderness.  Musculoskeletal: Normal range of motion.  Neurological: She is alert and oriented to person, place,  and time.  Skin: Skin is warm and dry. No rash noted.  Psychiatric: She has a normal mood and affect.    ED Course  Procedures (including critical care time) Labs Review Labs Reviewed - No data to display  Imaging Review No results found.   EKG Interpretation None      MDM   Final diagnoses:  Acute maxillary sinusitis, recurrence not specified  Right-sided epistaxis    Pt with sx suggesting acute sinusitis. Amoxil prescribed. Tessalon for cough, lungs clear, no indication for cxr today.  No active nose bleed at this time.  Given instructions for proper home tx if bleeding returns. Advised prn f/u here or pcp if sx persist, and if nosebleed returns and is unable to stop after 15 minute of pressure. Encouraged increased fluid intake.  flonase which she has.  The patient appears reasonably screened and/or stabilized for discharge and I doubt any other medical condition or other [facility] requiring further screening, evaluation, or treatment in the ED at this time prior to discharge.     Evalee Jefferson, PA-C 02/09/15 9311  Ephraim Hamburger, MD 02/14/15 [phone]

## 2015-03-25 DIAGNOSIS — I10 Essential (primary) hypertension: Secondary | ICD-10-CM | POA: Diagnosis not present

## 2015-03-25 DIAGNOSIS — Z6841 Body Mass Index (BMI) 40.0 and over, adult: Secondary | ICD-10-CM | POA: Diagnosis not present

## 2015-03-25 DIAGNOSIS — R7301 Impaired fasting glucose: Secondary | ICD-10-CM | POA: Diagnosis not present

## 2015-04-12 NOTE — Discharge Summary (Signed)
PATIENT NAME:  Megan Oneal MR#:  [id number] DATE OF BIRTH:  Mar 09, 1956  DATE OF ADMISSION:  04/25/2013 DATE OF DISCHARGE:  04/28/2013  ADMITTING DIAGNOSIS: Left knee osteoarthritis.  DISCHARGE DIAGNOSIS: Left knee osteoarthritis.  SURGERY:  On 04/24/2013, she underwent a left total knee replacement.   SURGEON: Dr. Hessie Knows.   ANESTHESIA: General.   ESTIMATED BLOOD LOSS: 250.   COMPLICATIONS: None.   IMPLANTS:  Medacta GMK primary total knee system, size 5 narrow femur, size 3 tibia with 10 mm standard tibial insert, and size 2 patella with bone cement.   HISTORY: Megan Oneal is a 59 year old female with years of knee pain with left knee osteoarthritis. She failed all conservative measures and was considered for total knee arthroplasty.   PHYSICAL EXAMINATION: GENERAL: Well-developed, no acute distress. Body habitus obese.  HEENT: PERRL, EOMI, conjunctivae and lids normal.  NECK: No JVD. Right carotid bruit.  ENDOCRINE:  Mild thyromegaly.  LUNGS: Clear lung fields. Resonant to percussion. Normal I to E ratio.  HEART:  Normal PMI. Normal S1 and S2. 1 to 2/6 systolic ejection murmur at the cardiac base.  ABDOMEN: Bowel sounds normal. Soft and nontender without masses or organomegaly.  MUSCULOSKELETAL: No deformities, cyanosis or clubbing.  NEUROLOGIC: Normal cranial nerves. Symmetric strength and tone.  SKIN: Warm.  No significant lesions.  EXTREMITIES: Normal distal pulses. No edema.   HOSPITAL COURSE: After initial admission on 04/25/2013, Ms. Mcquown underwent a total knee arthroplasty. On postoperative day 1, 04/26/2013, she had a hemoglobin of 9.9. Her pain was controlled with a PCA. Physical therapy was begun that day. On postoperative day 2, she continued to progress well with therapy. Her PCA was discontinued and she had better control with p.o. pain meds. On postoperative day 3, she was tolerating physical therapy well and is stable for discharge to rehab.   DISCHARGE  INSTRUCTIONS: The patient will follow up with [facility] orthopedics in 2 weeks for staple removal. She will do physical therapy will be weight-bearing as tolerated on the left lower extremity. She will use TED hose knee-high bilaterally. Her dressing can be changed once daily on an as needed basis.   DISCHARGE MEDICATIONS: 1.  Fluticasone nasal 50 mcg inhalation nasal spray 2 sprays nasal once daily as needed.  2.  Vitamin D 50,000 international units oral capsule 1 cap p.o. every Friday.  3.  Medroxyprogesterone 2.5 mg oral tablet 1 tab p.o. q. evening.  4.  Potassium chloride 10 mEq extended release 1 cap p.o. once daily p.r.n. Take with furosemide.  5.  Furosemide 40 mg 1 tab p.o. once daily p.r.n.  6.  Verapamil 180 mg/12 hour oral extended-release tablet 1 tab p.o. b.i.d.  7.  Dexilant 60 mg oral delayed release capsule 1 cap p.o. q. a.m.  8.  Losartan 100 mg tablet 1 tab p.o. q. 4 p.m. 9.  Tramadol 50 mg 1 tab p.o. q. 8 hours p.r.n. pain.  10.  Alprazolam 0.5 mg oral tablet 1 tab p.o. q. 8 hours p.r.n. anxiety.  11.  Acetaminophen 500 mg oral tablet 1 tab p.o. q. 4 hours p.r.n. pain or temperature greater than 100.4.  12.  Oxycodone 5 mg 1 to 2 tabs q. 4 to 6 hours p.r.n. pain.  13.  Magnesium hydroxide 8% oral suspension 30 mL p.o. b.i.d. p.r.n. constipation.  14.  Rivaroxaban 10 mg oral tablet 1 tab p.o. q. a.m. x 32 days.  15.  Zolpidem 5 mg tablet 1 tab p.o. at bedtime p.r.n. insomnia.  16.  Ranitidine 150 mg oral tablet 1 tab p.o. q. 12 hours.  17.  Bisacodyl 10 mg rectal suppository 1 suppository per rectum once daily p.r.n. constipation.       18.  Docusate/senna 50/8.6 one tab p.o. b.i.d.  19.  Benzocaine menthol topical 1 lozenge p.o. q. 2 hours p.r.n. sore throat. ____________________________ Dequarius Jeffries M. Tretha Sciara, NP amb:sb D: 04/28/2013 12:19:12 ET T: 04/28/2013 12:41:18 ET JOB#: [id number]  cc: Jorene Kaylor M. Tretha Sciara, NP, <Dictator> Kem Kays Cabria Micalizzi FNP ELECTRONICALLY SIGNED  05/05/2013 14:01

## 2015-04-12 NOTE — Op Note (Signed)
PATIENT NAME:  Megan Oneal, Megan Oneal MR#:  [id number] DATE OF BIRTH:  [birth year]  DATE OF PROCEDURE:  04/25/2013  PREOPERATIVE DIAGNOSIS: Severe osteoarthritis, left knee.   POSTOPERATIVE DIAGNOSIS: Severe osteoarthritis, left knee.   PROCEDURE: Left total knee replacement.   SURGEON: Laurene Footman, MD  ASSISTANT: April M. Tretha Sciara, NP  ANESTHESIA: General.  DESCRIPTION OF PROCEDURE: The patient was brought to the operating room, and after adequate anesthesia was obtained, the left leg was prepped and draped in the usual sterile fashion with a bump underneath the left buttock to internally rotate the leg. A tourniquet was applied to the upper thigh, and an Alvarado legholder was utilized. After prepping and draping in the usual sterile fashion, appropriate patient identification and timeout procedures were carried out. After this was complete, the leg was exsanguinated with an Esmarch and tourniquet raised to 300 mmHg. A midline skin incision was made, followed by a medial parapatellar arthrotomy. There was, on initial inspection, severe osteoarthritis of the knee with sclerotic bone medially and at the patellofemoral joint, with extensive osteophytes present. The fat pad and ACL were excised and the notch debrided of spurs. After this was completed, the proximal exposure was carried out to allow for application of the Medacta cutting block and the proximal tibia cut carried out. Subsequently during the case, the knee was tight in flexion and extension, so proximal tibia was recut, resecting an additional 2 mm. The femur was approached in a similar fashion with a Medacta cutting block applied after removing articular cartilage and then cutting distal femur. Cuts matched the block cuts well. The case was very difficult secondary to the patient's large size. The 4 trial was placed and fit well, as did the size 5 femur. At this point, after trials were placed, the knee was somewhat tight in flexion and extension,  so the additional 2 mm was resected and the 4 tibia re-placed and fit well with the appropriate rotation based on initial pin orientation. A 10 mm insert allowed for full extension and flexion. The patella was then cut with a freehand technique after finishing the femur off with distal drill holes and a notch cut for the trochlea, after having previously used the 5-in-1 cut. The patella sized to a size 2. There were extensive osteophytes, but good thickness, no excessive wear, although no cartilage remaining. The femoral trial fit well, and there was excellent tracking with no-touch technique. At this point, the trial components were removed and the tourniquet let down to make sure there were no problems with hemostasis as a significant posterior capsular release with elevation of the capsule off the femoral condyles and removal of posterior osteophytes was carried out because of her significant flexion contracture. There was no significant bleeding, and the tourniquet was again raised, and local anesthetic was infiltrated prior to raising the tourniquet with Exparel diluted with saline injected in the periarticular tissues. The tourniquet was again raised to 300 mmHg, the bony surfaces thoroughly irrigated and dried. The tibial component was cemented into place first, followed by the 10 mm standard insert. Femoral component was then placed and the knee held in extension as the cement set and the patellar button clamped into place with again bone cement. After the cement had set, the excess cement was removed with a small osteotome. The patella again tracked well with no-touch technique. The tourniquet was let down and the knee again thoroughly irrigated. The arthrotomy was then repaired using a heavy Quill suture, 2-0 Quill subcutaneously and  skin staples. A wound VAC over the incision with Mepitel was applied secondary to the patient's large size to try to prevent postoperative seroma along with a Polar Care and  knee immobilizer. The patient was sent to the recovery room in stable condition.   ESTIMATED BLOOD LOSS: 250.   COMPLICATIONS: None.   SPECIMEN: Cut ends of bone.   IMPLANTS: Medacta GMK primary total knee system, size 5 narrow femur, size 4 tibia with a 10 mm standard tibial insert and a size 2 patella. All components were cemented with standard bone cement.   ____________________________ Laurene Footman, MD mjm:OSi D: 04/25/2013 11:29:50 ET T: 04/25/2013 11:44:17 ET JOB#: [id number]  cc: Laurene Footman, MD, <Dictator> Laurene Footman MD ELECTRONICALLY SIGNED 04/25/2013 14:53

## 2015-04-13 NOTE — Discharge Summary (Signed)
PATIENT NAME:  Megan Oneal, Megan Oneal MR#:  [id number] DATE OF BIRTH:  [birth year]  DATE OF ADMISSION:  03/27/2014 DATE OF DISCHARGE:  03/30/2014  ADMITTING DIAGNOSIS:  Severe right knee osteoarthritis.   DISCHARGE DIAGNOSIS:  Severe right knee osteoarthritis.  OPERATION:  On 03/27/2014, the patient had a right total knee replacement.   ANESTHESIA:  General.   SURGEON:  Dr. Hessie Knows.   ASSISTANT:  Rachelle Hora, PA-C.   TOURNIQUET TIME:  115 minutes at 300 mmHg.   ESTIMATED BLOOD LOSS:  200 mL.   IMPLANTS:  Medacta GMK sphere primary component, right fixed tibial tray, two patella right sphere, four femur with a right 10 mm flex size four tibial insert.    The patient was stabilized, brought to the recovery room, and then brought down to the orthopedic floor.   HISTORY OF PRESENT ILLNESS:  The patient is a 59 year old female that has undergone a left total knee replacement on 04/25/2013 and was quite successful.  The patient has been having progressive right knee pain and difficulty with activities of daily living.  The patient has been refractory to conservative treatment including cortisone injections and Synvisc injection with no relief.  The patient has tricompartmental degenerative arthritis.   PHYSICAL EXAMINATION: GENERAL:  Obese female in no distress with an antalgic component to the right leg, ambulated with a cane.  MUSCULOSKELETAL:  In regard to the right knee the patient has a mild swelling throughout the knee with no significant effusion.  The patient has minus 7 degrees extension and 110 degrees of flexion.  The patient has a positive Baker's cyst with discomfort posteriorly.  The patient is neurovascularly intact.  HEART:  Regular rate and rhythm.  LUNGS:  Clear to auscultation.   HOSPITAL COURSE:  After initial admission on 03/27/2014 the patient was brought to the orthopedic floor.  On postoperative day 1, the patient's hemoglobin was at 10.5.  The patient remained stable  there.  The patient did have a wound VAC which was utilized and did quite well with.  The patient ambulated initially around the room and then progressed up to ambulating to 120 feet with physical therapy around the nurse's station and having no difficulty around the room.  The patient was ready to go home with home health physical therapy.   DISCHARGE INSTRUCTIONS: 1.  The patient will follow up with [facility] orthopedics on April 21st.  The patient will do weight bear as tolerated on the affected leg.  The patient will elevate her foot with 1 to 2 pillows to decrease the swelling.  The patient will use thigh-high TED hose on both legs, remove at bedtime.  The patient will elevate her heels off the bed and be encouraged to do cough and deep breathing.  2.  The patient's diet is regular.  3.  The patient will use a Polar Care to decrease swelling and try not to get her dressing wet, try to keep it clean.  The patient will have dressing change as needed with physical therapy.  The patient will call the clinic if there is any bright red bleeding or calf pain or bowel or bladder difficulty or any fever greater than 101.5.  The patient will do home health physical therapy working on gait training, range of motion and then strengthening.   DISCHARGE MEDICATIONS:  The patient will resume home medications and then to add Xarelto 10 mg 1 tablet once a day for 14 days, then discontinue and start aspirin 81 mg once  a day, Tylenol 500 mg 1 tablet q. 4 hours as needed for pain or fever greater than 100.4, Nucynta 50 mg 1 tablet every four hours as needed for severe pain.    ____________________________ Lenna Sciara. Reche Dixon, [place] jtm:ea D: 03/30/2014 05:49:37 ET T: 03/30/2014 06:22:54 ET JOB#: [id number]  cc: J. Reche Dixon, [place], <Dictator> J Raahil Ong [facility] PA ELECTRONICALLY SIGNED 04/04/2014 6:38

## 2015-04-13 NOTE — Op Note (Signed)
PATIENT NAME:  Megan Oneal, Megan Oneal MR#:  [id number] DATE OF BIRTH:  09-04-1956  DATE OF PROCEDURE:  03/27/2014  PREOPERATIVE DIAGNOSIS: Severe right knee osteoarthritis.   POSTOPERATIVE DIAGNOSIS: Severe right knee osteoarthritis.   PROCEDURE: Right total knee replacement.   ANESTHESIA: General.   SURGEON: Hessie Knows, M.D.   ASSISTANT:  Rachelle Hora, PA-C.   DESCRIPTION OF PROCEDURE: The patient was brought to the operating room and after adequate anesthesia was obtained, the right leg was prepped and draped in the usual sterile fashion with tourniquet applied to the upper thigh. After prepping and draping the leg and appropriate patient identification and timeout procedures were completed, the leg was exsanguinated with an Esmarch and the tourniquet raised to 300 mmHg. A  midline skin incision was made followed by medial parapatellar arthrotomy.  The case was made more difficult secondary to patient's large size and significant stiffness to the knee. After opening the knee there was severe tricompartmental osteoarthritis present as well as moderate synovitis in the suprapatellar pouch. After bone cuts were carried out, the gutters were cleared of synovitis. The fatpad  was excised and proximal exposure created to allow for application of the Medacta cutting block. This was applied and a proximal tibia cut was carried out. Next, the distal femur was exposed in a similar fashion and the Medacta femoral cutting guide made for the patient was applied, and the distal femoral cut carried out. Checking extension gap, it was still fairly tight, so an additional 2 mm was removed off the tibia and this gave appropriate extension gap. The 4-in-1 cutting block was then applied to the distal femur, and anterior, posterior and chamfer cuts were made. At this point, a trial femur was applied and the curved osteotome used to remove posterior osteophytes on the femur. At this point, the residual menisci were excised and  there were 3 very large loose bodies removed from the posterior aspect of the joint, which helped gain extension, as well as flexion with range of motion. The femoral trial was removed and the femoral component applied with distal femoral drill holes made. A 10 mm insert gave full extension and good stability. The tibial component had been pinned in place with a proximal drill hole made followed by the keel punch. These components were all removed and femoral trochlear groove cut was then carried out. The patella was cut using the patellar cutting guide and after drill holes were made sized to a size 2. The tourniquet was let down at this point to allow for hemostasis and injection of a combination of Exparel diluted with saline, Toradol, morphine and Sensorcaine. After infiltrating this anesthetic in the periarticular tissues, tourniquet was raised and the bony surfaces thoroughly irrigated and dried. The tibial component was cemented into place 1st with excess cement removed, followed by the femoral component. The knee was held in extension with a 10 mm trial followed by clamp cementing the patellar button in place. After the cement had set, the knee was thoroughly irrigated and the final 10 mm insert was placed, as this was the appropriate thickness with the set screw inserted. The joint was again thoroughly irrigated with excess cement having been removed. The knee was then repaired using Ethibond around the patella to help maintain repair medially and then a heavy quill suture, 2-0 quill subcutaneously and staples followed by incisional wound VAC with Mepitel and black foam followed by the wound VAC Polar Care and knee immobilizer. The patient was sent to the recovery room  in stable condition.   ESTIMATED BLOOD LOSS: 200.   TOURNIQUET TIME: 115 minutes at 300 mmHg.  IMPLANTS: Medacta GMK sphere primary components, right fixed tibial tray, 2 patella right sphere, 4 femur with a right 10 mm flex size 4  tibial insert.   SPECIMEN: Cut ends of bone and multiple loose bodies. There were no complications.   CONDITION: To recovery room, stable.    ____________________________ Laurene Footman, MD mjm:tc D: 03/27/2014 17:11:40 ET T: 03/27/2014 19:17:10 ET JOB#: [id number]  cc: Laurene Footman, MD, <Dictator> Laurene Footman MD ELECTRONICALLY SIGNED 03/27/2014 23:02

## 2015-04-15 DIAGNOSIS — R002 Palpitations: Secondary | ICD-10-CM | POA: Diagnosis not present

## 2015-04-15 DIAGNOSIS — Z6841 Body Mass Index (BMI) 40.0 and over, adult: Secondary | ICD-10-CM | POA: Diagnosis not present

## 2015-04-15 DIAGNOSIS — R000 Tachycardia, unspecified: Secondary | ICD-10-CM | POA: Diagnosis not present

## 2015-04-25 ENCOUNTER — Emergency Department (HOSPITAL_COMMUNITY)
Admission: EM | Admit: 2015-04-25 | Discharge: 2015-04-26 | Disposition: A | Discharge status: Home / Self Care | Payer: Medicare Other | Attending: Emergency Medicine | Admitting: Emergency Medicine

## 2015-04-25 ENCOUNTER — Encounter (HOSPITAL_COMMUNITY): Payer: Self-pay

## 2015-04-25 DIAGNOSIS — F419 Anxiety disorder, unspecified: Secondary | ICD-10-CM | POA: Insufficient documentation

## 2015-04-25 DIAGNOSIS — R002 Palpitations: Secondary | ICD-10-CM | POA: Diagnosis not present

## 2015-04-25 DIAGNOSIS — E876 Hypokalemia: Secondary | ICD-10-CM | POA: Diagnosis not present

## 2015-04-25 DIAGNOSIS — Z87448 Personal history of other diseases of urinary system: Secondary | ICD-10-CM | POA: Insufficient documentation

## 2015-04-25 DIAGNOSIS — Z8739 Personal history of other diseases of the musculoskeletal system and connective tissue: Secondary | ICD-10-CM | POA: Diagnosis not present

## 2015-04-25 DIAGNOSIS — Z87891 Personal history of nicotine dependence: Secondary | ICD-10-CM | POA: Diagnosis not present

## 2015-04-25 DIAGNOSIS — Z8669 Personal history of other diseases of the nervous system and sense organs: Secondary | ICD-10-CM | POA: Diagnosis not present

## 2015-04-25 DIAGNOSIS — Z7951 Long term (current) use of inhaled steroids: Secondary | ICD-10-CM | POA: Diagnosis not present

## 2015-04-25 DIAGNOSIS — Z79899 Other long term (current) drug therapy: Secondary | ICD-10-CM | POA: Insufficient documentation

## 2015-04-25 DIAGNOSIS — I491 Atrial premature depolarization: Secondary | ICD-10-CM | POA: Diagnosis not present

## 2015-04-25 DIAGNOSIS — Z7982 Long term (current) use of aspirin: Secondary | ICD-10-CM | POA: Insufficient documentation

## 2015-04-25 DIAGNOSIS — I10 Essential (primary) hypertension: Secondary | ICD-10-CM | POA: Diagnosis not present

## 2015-04-25 LAB — CBC WITH DIFFERENTIAL/PLATELET
BASOS ABS: 0 10*3/uL (ref 0.0–0.1)
Basophils Relative: 1 % (ref 0–1)
Eosinophils Absolute: 0.2 10*3/uL (ref 0.0–0.7)
Eosinophils Relative: 3 % (ref 0–5)
HCT: 39.3 % (ref 36.0–46.0)
HEMOGLOBIN: 12.5 g/dL (ref 12.0–15.0)
LYMPHS ABS: 2.5 10*3/uL (ref 0.7–4.0)
LYMPHS PCT: 35 % (ref 12–46)
MCH: 26.7 pg (ref 26.0–34.0)
MCHC: 31.8 g/dL (ref 30.0–36.0)
MCV: 84 fL (ref 78.0–100.0)
MONOS PCT: 7 % (ref 3–12)
Monocytes Absolute: 0.5 10*3/uL (ref 0.1–1.0)
NEUTROS ABS: 3.8 10*3/uL (ref 1.7–7.7)
Neutrophils Relative %: 54 % (ref 43–77)
PLATELETS: 270 10*3/uL (ref 150–400)
RBC: 4.68 MIL/uL (ref 3.87–5.11)
RDW: 13.9 % (ref 11.5–15.5)
WBC: 7.1 10*3/uL (ref 4.0–10.5)

## 2015-04-25 NOTE — ED Provider Notes (Signed)
CSN: [id number]     Arrival date & time 04/25/15  2303 History   First MD Initiated Contact with Patient 04/25/15 2308    This chart was scribed for Veryl Speak, MD by Terressa Koyanagi, ED Scribe. This patient was seen in room APA01/APA01 and the patient's care was started at 11:20 PM.  Chief Complaint  Patient presents with  . Palpitations   The history is provided by the patient. No language interpreter was used.   PCP: Purvis Kilts, MD HPI Comments: Megan Oneal is a 59 y.o. female, with PMH noted below including HTN and palpitations, who presents to the Emergency Department complaining of recurrent, intermittent palpitations onset 2-3 weeks ago. Pt reports that she f/u with her PCP regarding the same and her PCP plans to refer her to cardiology. Pt notes that she had frequent episodes of palpitations a couple of years ago, however, after her PCP adjusted her BP meds the palpitations stopped until her current Sx. Pt denies SOB, stent placement, chest pain, abd pain, caffeine use, EtOH use, tobacco use, or any other Sx at this time.   Past Medical History  Diagnosis Date  . Hypertension   . Otalgia, unspecified   . Degenerative disc disease, lumbar     Epidural injections in 2007  . Endometriosis   . Vascular bruit 01/2013    Right neck  . Anxiety 03/12/2013  . Palpitations 10/04/2010  . Frequent PVCs     12,000/24 hours   Past Surgical History  Procedure Laterality Date  . Lumbar laminectomy  2009     L4-L5 and L5-S1 laminectomy with diskectomy; transforaminal  . Laparoscopic cholecystectomy  2010  . Tubal ligation    . Breast surgery      benign tumor excised-age 31  . Cyst excision  2007    Scalp  . Colonoscopy  2009  . Total knee arthroplasty Bilateral   . Replacement total knee     Family History  Problem Relation Age of Onset  . Diabetes Mother   . Hypertension Mother   . Diabetes Brother   . Hypertension Brother    History  Substance Use Topics  . Smoking  status: Former Smoker -- 2.00 packs/day for 20 years    Types: Cigarettes  . Smokeless tobacco: Former Systems developer    Quit date: 12/21/1994  . Alcohol Use: No   OB History    No data available     Review of Systems    A complete 10 system review of systems was obtained and all systems are negative except as noted in the HPI and PMH.    Allergies  Metoprolol and Sulfa antibiotics  Home Medications   Prior to Admission medications   Medication Sig Start Date End Date Taking? Authorizing Provider  ALPRAZolam Duanne Moron) 0.5 MG tablet Take 0.5 mg by mouth 3 (three) times daily as needed for anxiety or sleep.     Historical Provider, MD  aspirin 81 MG tablet Take 81 mg by mouth daily.    Historical Provider, MD  benzonatate (TESSALON) 100 MG capsule Take 2 capsules (200 mg total) by mouth 3 (three) times daily as needed for cough. 02/07/15   Evalee Jefferson, PA-C  dexlansoprazole (DEXILANT) 60 MG capsule Take 60 mg by mouth daily.    Historical Provider, MD  escitalopram (LEXAPRO) 10 MG tablet Take 10 mg by mouth daily. 11/23/14   Historical Provider, MD  etodolac (LODINE) 500 MG tablet  12/24/14   Historical Provider, MD  ferrous sulfate 325 (65 FE) MG tablet Take 325 mg by mouth 2 (two) times daily with a meal.    Historical Provider, MD  fluticasone (FLONASE) 50 MCG/ACT nasal spray Place 2 sprays into the nose daily.    Historical Provider, MD  furosemide (LASIX) 40 MG tablet Take 40 mg by mouth daily.    Historical Provider, MD  HYDROcodone-acetaminophen (NORCO/VICODIN) 5-325 MG per tablet Take 1 tablet by mouth every 4 (four) hours as needed.  11/23/14   Historical Provider, MD  losartan (COZAAR) 100 MG tablet Take 100 mg by mouth daily.    Historical Provider, MD  Meclizine HCl 25 MG CHEW Chew 1 tablet by mouth as needed.  11/23/14   Historical Provider, MD  medroxyPROGESTERone (PROVERA) 2.5 MG tablet Take 2.5 mg by mouth daily.    Historical Provider, MD  medroxyPROGESTERone (PROVERA) 2.5 MG tablet  Take 1 tablet (2.5 mg total) by mouth daily. 11/12/14   Florian Buff, MD  meloxicam (MOBIC) 15 MG tablet Take 15 mg by mouth daily as needed for pain.    Historical Provider, MD  metroNIDAZOLE (FLAGYL) 500 MG tablet Take 1 tablet (500 mg total) by mouth 2 (two) times daily. 12/31/14   Florian Buff, MD  nystatin-triamcinolone ointment [facility]) Apply 1 application topically 2 (two) times daily. Patient not taking: Reported on 12/31/2014 11/12/14   Florian Buff, MD  potassium chloride (K-DUR,KLOR-CON) 10 MEQ tablet Take 10 mEq by mouth daily.    Historical Provider, MD  traMADol (ULTRAM) 50 MG tablet Take 50 mg by mouth every 6 (six) hours as needed.  11/23/14   Historical Provider, MD  verapamil (CALAN-SR) 180 MG CR tablet Take 180 mg by mouth 2 (two) times daily.    Historical Provider, MD  Vitamin D, Ergocalciferol, (DRISDOL) 50000 UNITS CAPS capsule Take 50,000 Units by mouth every 7 (seven) days.    Historical Provider, MD   Triage Vitals: BP 134/65 mmHg  Pulse 85  Temp(Src) 98.2 F (36.8 C) (Oral)  Resp 20  Ht 5' 6.5" (1.689 m)  Wt 300 lb (136.079 kg)  BMI 47.70 kg/m2  SpO2 100% Physical Exam  Constitutional: She is oriented to person, place, and time. She appears well-developed and well-nourished. No distress.  HENT:  Head: Normocephalic and atraumatic.  Eyes: Conjunctivae and EOM are normal.  Neck: Neck supple.  Cardiovascular: Normal rate.   Pulmonary/Chest: Effort normal. No respiratory distress.  Musculoskeletal: Normal range of motion.  Neurological: She is alert and oriented to person, place, and time.  Skin: Skin is warm and dry.  Psychiatric: She has a normal mood and affect. Her behavior is normal.  Nursing note and vitals reviewed.   ED Course  Procedures (including critical care time) DIAGNOSTIC STUDIES: Oxygen Saturation is 100% on RA, nl by my interpretation.    COORDINATION OF CARE: 11:26 PM-Discussed treatment plan which includes labs and EKG results with  pt at bedside and pt agreed to plan.   Labs Review Labs Reviewed - No data to display  Imaging Review No results found.  ED ECG REPORT   Date: 04/26/2015  Rate: 85  Rhythm: Sinus rhythm with PACs  QRS Axis: normal  Intervals: normal  ST/T Wave abnormalities: normal  Conduction Disutrbances:none  Narrative Interpretation:   Old EKG Reviewed: none available  I have personally reviewed the EKG tracing and agree with the computerized printout as noted.   MDM   Final diagnoses:  None    Patient presents here with complaints  of palpitations. She is displaying PACs on the monitor, however is in a sinus rhythm with no other ectopy observed. Her workup reveals a potassium of 2.9 which may be exacerbating her situation. She will be treated with oral potassium and follow-up with her primary doctor. She tells me her primary doctors arranging a follow-up appointment with a cardiologist and I have advised her to pursue this. She understands to return if her symptoms significantly worsen or change.  I personally performed the services described in this documentation, which was scribed in my presence. The recorded information has been reviewed and is accurate.      Veryl Speak, MD 04/26/15 [phone]

## 2015-04-25 NOTE — ED Notes (Signed)
Pt reports intermittent palpitations today/tonight, denies pain

## 2015-04-26 ENCOUNTER — Emergency Department (HOSPITAL_COMMUNITY): Payer: Medicare Other

## 2015-04-26 LAB — COMPREHENSIVE METABOLIC PANEL
ALT: 15 U/L (ref 14–54)
AST: 20 U/L (ref 15–41)
Albumin: 4.1 g/dL (ref 3.5–5.0)
Alkaline Phosphatase: 86 U/L (ref 38–126)
Anion gap: 10 (ref 5–15)
BUN: 9 mg/dL (ref 6–20)
CO2: 27 mmol/L (ref 22–32)
Calcium: 9.2 mg/dL (ref 8.9–10.3)
Chloride: 104 mmol/L (ref 101–111)
Creatinine, Ser: 0.81 mg/dL (ref 0.44–1.00)
GLUCOSE: 121 mg/dL — AB (ref 70–99)
POTASSIUM: 2.9 mmol/L — AB (ref 3.5–5.1)
Sodium: 141 mmol/L (ref 135–145)
Total Bilirubin: 0.5 mg/dL (ref 0.3–1.2)
Total Protein: 7.9 g/dL (ref 6.5–8.1)

## 2015-04-26 LAB — TROPONIN I

## 2015-04-26 LAB — TSH: TSH: 1.034 u[IU]/mL (ref 0.350–4.500)

## 2015-04-26 MED ORDER — POTASSIUM CHLORIDE ER 10 MEQ PO TBCR: 20.0000 meq | EXTENDED_RELEASE_TABLET | Freq: Two times a day (BID) | ORAL | Status: DC

## 2015-04-26 MED ORDER — POTASSIUM CHLORIDE CRYS ER 20 MEQ PO TBCR
40.0000 meq | EXTENDED_RELEASE_TABLET | Freq: Once | ORAL | Status: AC
  Administered 2015-04-26: 40 meq via ORAL
  Filled 2015-04-26: qty 2

## 2015-04-26 NOTE — Discharge Instructions (Signed)
Potassium as prescribed.  Follow-up with your primary Dr. to discuss a referral to a cardiologist.  Return to the emergency department if your symptoms significantly worsen or change.   Palpitations A palpitation is the feeling that your heartbeat is irregular or is faster than normal. It may feel like your heart is fluttering or skipping a beat. Palpitations are usually not a serious problem. However, in some cases, you may need further medical evaluation. CAUSES  Palpitations can be caused by:  Smoking.  Caffeine or other stimulants, such as diet pills or energy drinks.  Alcohol.  Stress and anxiety.  Strenuous physical activity.  Fatigue.  Certain medicines.  Heart disease, especially if you have a history of irregular heart rhythms (arrhythmias), such as atrial fibrillation, atrial flutter, or supraventricular tachycardia.  An improperly working pacemaker or defibrillator. DIAGNOSIS  To find the cause of your palpitations, your health care provider will take your medical history and perform a physical exam. Your health care provider may also have you take a test called an ambulatory electrocardiogram (ECG). An ECG records your heartbeat patterns over a 24-hour period. You may also have other tests, such as:  Transthoracic echocardiogram (TTE). During echocardiography, sound waves are used to evaluate how blood flows through your heart.  Transesophageal echocardiogram (TEE).  Cardiac monitoring. This allows your health care provider to monitor your heart rate and rhythm in real time.  Holter monitor. This is a portable device that records your heartbeat and can help diagnose heart arrhythmias. It allows your health care provider to track your heart activity for several days, if needed.  Stress tests by exercise or by giving medicine that makes the heart beat faster. TREATMENT  Treatment of palpitations depends on the cause of your symptoms and can vary greatly. Most cases  of palpitations do not require any treatment other than time, relaxation, and monitoring your symptoms. Other causes, such as atrial fibrillation, atrial flutter, or supraventricular tachycardia, usually require further treatment. HOME CARE INSTRUCTIONS   Avoid:  Caffeinated coffee, tea, soft drinks, diet pills, and energy drinks.  Chocolate.  Alcohol.  Stop smoking if you smoke.  Reduce your stress and anxiety. Things that can help you relax include:  A method of controlling things in your body, such as your heartbeats, with your mind (biofeedback).  Yoga.  Meditation.  Physical activity such as swimming, jogging, or walking.  Get plenty of rest and sleep. SEEK MEDICAL CARE IF:   You continue to have a fast or irregular heartbeat beyond 24 hours.  Your palpitations occur more often. SEEK IMMEDIATE MEDICAL CARE IF:  You have chest pain or shortness of breath.  You have a severe headache.  You feel dizzy or you faint. MAKE SURE YOU:  Understand these instructions.  Will watch your condition.  Will get help right away if you are not doing well or get worse. Document Released: 12/04/2000 Document Revised: 12/12/2013 Document Reviewed: 02/05/2012 [facility] Patient Information 2015 [place], [place]. This information is not intended to replace advice given to you by your health care provider. Make sure you discuss any questions you have with your health care provider.  Hypokalemia Hypokalemia means that the amount of potassium in the blood is lower than normal.Potassium is a chemical, called an electrolyte, that helps regulate the amount of fluid in the body. It also stimulates muscle contraction and helps nerves function properly.Most of the body's potassium is inside of cells, and only a very small amount is in the blood. Because the amount  in the blood is so small, minor changes can be life-threatening. CAUSES  Antibiotics.  Diarrhea or vomiting.  Using laxatives  too much, which can cause diarrhea.  Chronic kidney disease.  Water pills (diuretics).  Eating disorders (bulimia).  Low magnesium level.  Sweating a lot. SIGNS AND SYMPTOMS  Weakness.  Constipation.  Fatigue.  Muscle cramps.  Mental confusion.  Skipped heartbeats or irregular heartbeat (palpitations).  Tingling or numbness. DIAGNOSIS  Your health care provider can diagnose hypokalemia with blood tests. In addition to checking your potassium level, your health care provider may also check other lab tests. TREATMENT Hypokalemia can be treated with potassium supplements taken by mouth or adjustments in your current medicines. If your potassium level is very low, you may need to get potassium through a vein (IV) and be monitored in the hospital. A diet high in potassium is also helpful. Foods high in potassium are:  Nuts, such as peanuts and pistachios.  Seeds, such as sunflower seeds and pumpkin seeds.  Peas, lentils, and lima beans.  Whole grain and bran cereals and breads.  Fresh fruit and vegetables, such as apricots, avocado, bananas, cantaloupe, kiwi, oranges, tomatoes, asparagus, and potatoes.  Orange and tomato juices.  Red meats.  Fruit yogurt. HOME CARE INSTRUCTIONS  Take all medicines as prescribed by your health care provider.  Maintain a healthy diet by including nutritious food, such as fruits, vegetables, nuts, whole grains, and lean meats.  If you are taking a laxative, be sure to follow the directions on the label. SEEK MEDICAL CARE IF:  Your weakness gets worse.  You feel your heart pounding or racing.  You are vomiting or having diarrhea.  You are diabetic and having trouble keeping your blood glucose in the normal range. SEEK IMMEDIATE MEDICAL CARE IF:  You have chest pain, shortness of breath, or dizziness.  You are vomiting or having diarrhea for more than 2 days.  You faint. MAKE SURE YOU:   Understand these  instructions.  Will watch your condition.  Will get help right away if you are not doing well or get worse. Document Released: 12/07/2005 Document Revised: 09/27/2013 Document Reviewed: 06/09/2013 [facility] Patient Information 2015 [place], [place]. This information is not intended to replace advice given to you by your health care provider. Make sure you discuss any questions you have with your health care provider.

## 2015-04-30 DIAGNOSIS — R0602 Shortness of breath: Secondary | ICD-10-CM | POA: Diagnosis not present

## 2015-04-30 DIAGNOSIS — I471 Supraventricular tachycardia: Secondary | ICD-10-CM | POA: Diagnosis not present

## 2015-05-01 DIAGNOSIS — I471 Supraventricular tachycardia: Secondary | ICD-10-CM | POA: Diagnosis not present

## 2015-05-02 DIAGNOSIS — R0602 Shortness of breath: Secondary | ICD-10-CM | POA: Diagnosis not present

## 2015-05-02 DIAGNOSIS — I471 Supraventricular tachycardia: Secondary | ICD-10-CM | POA: Diagnosis not present

## 2015-05-03 DIAGNOSIS — I471 Supraventricular tachycardia: Secondary | ICD-10-CM | POA: Diagnosis not present

## 2015-05-03 DIAGNOSIS — R0602 Shortness of breath: Secondary | ICD-10-CM | POA: Diagnosis not present

## 2015-05-23 ENCOUNTER — Ambulatory Visit (INDEPENDENT_AMBULATORY_CARE_PROVIDER_SITE_OTHER): Payer: Medicare Other | Admitting: Obstetrics & Gynecology

## 2015-05-23 ENCOUNTER — Encounter: Payer: Self-pay | Admitting: Obstetrics & Gynecology

## 2015-05-23 VITALS — BP 122/70 | HR 72 | Wt 308.0 lb

## 2015-05-23 DIAGNOSIS — N611 Abscess of the breast and nipple: Secondary | ICD-10-CM

## 2015-05-23 DIAGNOSIS — N61 Inflammatory disorders of breast: Secondary | ICD-10-CM

## 2015-05-23 MED ORDER — SILVER SULFADIAZINE 1 % EX CREA: TOPICAL_CREAM | CUTANEOUS | Status: DC

## 2015-05-23 MED ORDER — DOXYCYCLINE HYCLATE 50 MG PO CAPS: ORAL_CAPSULE | ORAL | Status: DC

## 2015-05-23 NOTE — Progress Notes (Signed)
Patient ID: Megan Oneal, female   DOB: 28-Aug-1956, 59 y.o.   MRN: [id number]   Chief Complaint  Patient presents with  . gyn visit    cyst in LT breast.     HPI:    59 y.o. No obstetric history on file. No LMP recorded. Patient is postmenopausal.  Small area on left breast appeared Sunday night Monday morning Location:  Left breast. Quality:  Erythema tender. Severity:  Mild. Timing:  4 days. Duration:  Or days. Context:  . Modifying factors:   Signs/Symptoms:      Current outpatient prescriptions:  .  ALPRAZolam (XANAX) 0.5 MG tablet, Take 0.5 mg by mouth 3 (three) times daily as needed for anxiety or sleep. , Disp: , Rfl:  .  aspirin 81 MG tablet, Take 81 mg by mouth daily., Disp: , Rfl:  .  dexlansoprazole (DEXILANT) 60 MG capsule, Take 60 mg by mouth daily., Disp: , Rfl:  .  etodolac (LODINE) 500 MG tablet, , Disp: , Rfl:  .  ferrous sulfate 325 (65 FE) MG tablet, Take 325 mg by mouth 2 (two) times daily with a meal., Disp: , Rfl:  .  furosemide (LASIX) 40 MG tablet, Take 40 mg by mouth daily., Disp: , Rfl:  .  HYDROcodone-acetaminophen (NORCO/VICODIN) 5-325 MG per tablet, Take 1 tablet by mouth every 4 (four) hours as needed. , Disp: , Rfl: 0 .  losartan (COZAAR) 100 MG tablet, Take 100 mg by mouth daily., Disp: , Rfl:  .  Meclizine HCl 25 MG CHEW, Chew 1 tablet by mouth as needed. , Disp: , Rfl: 3 .  medroxyPROGESTERone (PROVERA) 2.5 MG tablet, Take 2.5 mg by mouth daily., Disp: , Rfl:  .  nystatin-triamcinolone ointment (MYCOLOG), Apply 1 application topically 2 (two) times daily., Disp: 30 g, Rfl: 11 .  traMADol (ULTRAM) 50 MG tablet, Take 50 mg by mouth every 6 (six) hours as needed. , Disp: , Rfl: 1 .  verapamil (CALAN-SR) 180 MG CR tablet, Take 180 mg by mouth 2 (two) times daily., Disp: , Rfl:  .  Vitamin D, Ergocalciferol, (DRISDOL) 50000 UNITS CAPS capsule, Take 50,000 Units by mouth every 7 (seven) days., Disp: , Rfl:  .  benzonatate (TESSALON) 100 MG capsule,  Take 2 capsules (200 mg total) by mouth 3 (three) times daily as needed for cough. (Patient not taking: Reported on 05/23/2015), Disp: 30 capsule, Rfl: 0 .  escitalopram (LEXAPRO) 10 MG tablet, Take 10 mg by mouth daily., Disp: , Rfl: 3 .  fluticasone (FLONASE) 50 MCG/ACT nasal spray, Place 2 sprays into the nose daily., Disp: , Rfl:  .  medroxyPROGESTERone (PROVERA) 2.5 MG tablet, Take 1 tablet (2.5 mg total) by mouth daily. (Patient not taking: Reported on 05/23/2015), Disp: 30 tablet, Rfl: 11 .  meloxicam (MOBIC) 15 MG tablet, Take 15 mg by mouth daily as needed for pain., Disp: , Rfl:  .  metroNIDAZOLE (FLAGYL) 500 MG tablet, Take 1 tablet (500 mg total) by mouth 2 (two) times daily. (Patient not taking: Reported on 05/23/2015), Disp: 14 tablet, Rfl: 0 .  potassium chloride (K-DUR) 10 MEQ tablet, Take 2 tablets (20 mEq total) by mouth 2 (two) times daily. (Patient not taking: Reported on 05/23/2015), Disp: 20 tablet, Rfl: 0 .  potassium chloride (K-DUR,KLOR-CON) 10 MEQ tablet, Take 10 mEq by mouth daily., Disp: , Rfl:   Problem Pertinent ROS:         Extended ROS:        [place]:  Past Medical History  Diagnosis Date  . Hypertension   . Otalgia, unspecified   . Degenerative disc disease, lumbar     Epidural injections in 2007  . Endometriosis   . Vascular bruit 01/2013    Right neck  . Anxiety 03/12/2013  . Palpitations 10/04/2010  . Frequent PVCs     12,000/24 hours  . Diabetes mellitus without complication     Past Surgical History  Procedure Laterality Date  . Lumbar laminectomy  2009     L4-L5 and L5-S1 laminectomy with diskectomy; transforaminal  . Laparoscopic cholecystectomy  2010  . Tubal ligation    . Breast surgery      benign tumor excised-age 69  . Cyst excision  2007    Scalp  . Colonoscopy  2009  . Total knee arthroplasty Bilateral   . Replacement total knee      OB History    No data available      Allergies  Allergen Reactions  . Metoprolol  Other (See Comments)    Fatigue and malaise; wheezing  . Sulfa Antibiotics     History   Social History  . Marital Status: Divorced    Spouse Name: N/A  . Number of Children: 3  . Years of Education: N/A   Social History Main Topics  . Smoking status: Former Smoker -- 2.00 packs/day for 20 years    Types: Cigarettes  . Smokeless tobacco: Former Systems developer    Quit date: 12/21/1994  . Alcohol Use: No  . Drug Use: No  . Sexual Activity: Yes   Other Topics Concern  . None   Social History Narrative    Family History  Problem Relation Age of Onset  . Diabetes Mother   . Hypertension Mother   . Diabetes Brother   . Hypertension Brother      Examination:  Vitals:  Blood pressure 122/70, pulse 72, weight 308 lb (139.708 kg).    Physical Examination:       Boil present on left breast superficial erythema draining    DATA orders and reviews: Labs were not ordered today:   Imaging studies were not ordered today:    Lab tests were not reviewed today:    Imaging studies were not reviewed today:    I did not independently review/view images, tracing or specimen(not simply the report) myself.  Prescription Drug Management:  New Prescriptions: Doxycycline 100 mg BID x 10 days, silvadene cream Renewed Prescriptions:   Current prescription changes:     Impression/Plan(Problem Based): 1.  Boil left breast      (new problem) : Additional workup is not needed:       Follow Up:   2  weeks

## 2015-05-29 DIAGNOSIS — I471 Supraventricular tachycardia: Secondary | ICD-10-CM | POA: Diagnosis not present

## 2015-06-04 DIAGNOSIS — R002 Palpitations: Secondary | ICD-10-CM | POA: Diagnosis not present

## 2015-06-06 ENCOUNTER — Encounter: Payer: Self-pay | Admitting: Obstetrics & Gynecology

## 2015-06-06 ENCOUNTER — Ambulatory Visit (INDEPENDENT_AMBULATORY_CARE_PROVIDER_SITE_OTHER): Payer: Medicare Other | Admitting: Obstetrics & Gynecology

## 2015-06-06 VITALS — BP 130/80 | HR 80 | Wt 307.0 lb

## 2015-06-06 DIAGNOSIS — N611 Abscess of the breast and nipple: Secondary | ICD-10-CM

## 2015-06-06 DIAGNOSIS — N61 Inflammatory disorders of breast: Secondary | ICD-10-CM | POA: Diagnosis not present

## 2015-06-06 NOTE — Progress Notes (Signed)
Patient ID: Megan Oneal, female   DOB: [birth year], 59 y.o.   MRN: [id number] Chief Complaint  Patient presents with  . Follow-up    Blood pressure 130/80, pulse 80, weight 307 lb (139.254 kg).  Left breast boil: "much better"  Exam Skin peeling erythema resolved no evidence of infection  Left breast boil on doxycycline and silvadene  Follow up prn

## 2015-06-26 DIAGNOSIS — Z96651 Presence of right artificial knee joint: Secondary | ICD-10-CM | POA: Diagnosis not present

## 2015-08-16 ENCOUNTER — Encounter: Payer: Self-pay | Admitting: Internal Medicine

## 2015-08-16 ENCOUNTER — Encounter: Payer: Self-pay | Admitting: Cardiovascular Disease

## 2015-09-30 DIAGNOSIS — Z23 Encounter for immunization: Secondary | ICD-10-CM | POA: Diagnosis not present

## 2015-09-30 DIAGNOSIS — Z1389 Encounter for screening for other disorder: Secondary | ICD-10-CM | POA: Diagnosis not present

## 2015-09-30 DIAGNOSIS — N342 Other urethritis: Secondary | ICD-10-CM | POA: Diagnosis not present

## 2015-09-30 DIAGNOSIS — Z6841 Body Mass Index (BMI) 40.0 and over, adult: Secondary | ICD-10-CM | POA: Diagnosis not present

## 2015-09-30 DIAGNOSIS — R350 Frequency of micturition: Secondary | ICD-10-CM | POA: Diagnosis not present

## 2015-11-25 ENCOUNTER — Other Ambulatory Visit: Payer: Self-pay | Admitting: Obstetrics & Gynecology

## 2015-12-11 DIAGNOSIS — I10 Essential (primary) hypertension: Secondary | ICD-10-CM | POA: Diagnosis not present

## 2015-12-11 DIAGNOSIS — F419 Anxiety disorder, unspecified: Secondary | ICD-10-CM | POA: Diagnosis not present

## 2015-12-11 DIAGNOSIS — M5432 Sciatica, left side: Secondary | ICD-10-CM | POA: Diagnosis not present

## 2015-12-11 DIAGNOSIS — Z1389 Encounter for screening for other disorder: Secondary | ICD-10-CM | POA: Diagnosis not present

## 2015-12-11 DIAGNOSIS — Z6841 Body Mass Index (BMI) 40.0 and over, adult: Secondary | ICD-10-CM | POA: Diagnosis not present

## 2016-01-06 ENCOUNTER — Other Ambulatory Visit (HOSPITAL_COMMUNITY): Payer: Self-pay | Admitting: Family Medicine

## 2016-01-06 DIAGNOSIS — Z1231 Encounter for screening mammogram for malignant neoplasm of breast: Secondary | ICD-10-CM

## 2016-01-15 ENCOUNTER — Ambulatory Visit (HOSPITAL_COMMUNITY)
Admission: RE | Admit: 2016-01-15 | Discharge: 2016-01-15 | Disposition: A | Discharge status: Home / Self Care | Payer: Medicare Other | Source: Ambulatory Visit | Attending: Family Medicine | Admitting: Family Medicine

## 2016-01-15 DIAGNOSIS — Z1231 Encounter for screening mammogram for malignant neoplasm of breast: Secondary | ICD-10-CM | POA: Diagnosis not present

## 2016-02-03 DIAGNOSIS — R7309 Other abnormal glucose: Secondary | ICD-10-CM | POA: Diagnosis not present

## 2016-02-03 DIAGNOSIS — I10 Essential (primary) hypertension: Secondary | ICD-10-CM | POA: Diagnosis not present

## 2016-02-03 DIAGNOSIS — M5136 Other intervertebral disc degeneration, lumbar region: Secondary | ICD-10-CM | POA: Diagnosis not present

## 2016-02-03 DIAGNOSIS — E782 Mixed hyperlipidemia: Secondary | ICD-10-CM | POA: Diagnosis not present

## 2016-02-03 DIAGNOSIS — Z6841 Body Mass Index (BMI) 40.0 and over, adult: Secondary | ICD-10-CM | POA: Diagnosis not present

## 2016-02-03 DIAGNOSIS — R350 Frequency of micturition: Secondary | ICD-10-CM | POA: Diagnosis not present

## 2016-02-03 DIAGNOSIS — M545 Low back pain: Secondary | ICD-10-CM | POA: Diagnosis not present

## 2016-02-03 DIAGNOSIS — Z1389 Encounter for screening for other disorder: Secondary | ICD-10-CM | POA: Diagnosis not present

## 2016-02-03 DIAGNOSIS — H6123 Impacted cerumen, bilateral: Secondary | ICD-10-CM | POA: Diagnosis not present

## 2016-06-03 DIAGNOSIS — R002 Palpitations: Secondary | ICD-10-CM | POA: Diagnosis not present

## 2016-09-25 ENCOUNTER — Other Ambulatory Visit: Payer: Self-pay | Admitting: Obstetrics & Gynecology

## 2016-10-23 DIAGNOSIS — Z23 Encounter for immunization: Secondary | ICD-10-CM | POA: Diagnosis not present

## 2016-10-29 ENCOUNTER — Encounter: Payer: Self-pay | Admitting: Obstetrics & Gynecology

## 2016-10-29 ENCOUNTER — Ambulatory Visit (INDEPENDENT_AMBULATORY_CARE_PROVIDER_SITE_OTHER): Payer: Medicare Other | Admitting: Obstetrics & Gynecology

## 2016-10-29 VITALS — BP 140/80 | HR 82 | Ht 67.0 in | Wt 332.3 lb

## 2016-10-29 DIAGNOSIS — Z6841 Body Mass Index (BMI) 40.0 and over, adult: Secondary | ICD-10-CM | POA: Diagnosis not present

## 2016-10-29 DIAGNOSIS — N950 Postmenopausal bleeding: Secondary | ICD-10-CM

## 2016-10-29 NOTE — Progress Notes (Signed)
Subjective:    Megan Oneal is a 60 y.o., post-menopausal female who presents for concerns regarding vaginal bleeding. She has been menopausal for 13 years. Currently on provera 2.5 mg daily and has been on this regimen for 2 years . Bleeding is described as for 1 day when she wiped when she urinated and has occurred 1 times. Other menopausal symptoms include: none. Workup to date: none.  Menstrual History: OB History    No data available      Menarche age: 51 years ol  No LMP recorded. Patient is postmenopausal.    The following portions of the patient's history were reviewed and updated as appropriate: allergies, current medications, past family history, past medical history, past social history, past surgical history and problem list.  Review of Systems Pertinent items noted in HPI and remainder of comprehensive ROS otherwise negative.    Objective:    BP 140/80   Pulse 82   Ht 5\' 7"  (1.702 m)   Wt (!) 332 lb 4.8 oz (150.7 kg)   BMI 52.05 kg/m  BP 140/80   Pulse 82   Ht 5\' 7"  (1.702 m)   Wt (!) 332 lb 4.8 oz (150.7 kg)   BMI 52.05 kg/m                                                                General WDWN female NAD Vulva:  normal appearing vulva with no masses, tenderness or lesions Vagina:  normal mucosa, no discharge, no  lesions Cervix:  no lesions Uterus:  normal size, contour, position, consistency, mobility, non-tender Adnexa: ovaries:present,  normal adnexa in size, nnder and no masses, not palpable   Assessment:    Postmenopausal bleeding on daily provera to suppress the endometrium due to morbid obesity  Plan:    All questions answered. Pelvic ultrasound. ordered for next week  Orders Placed This Encounter  Procedures  . US Pelvis Complete    Standing Status:   Future    Standing Expiration Date:   12/29/2017    Order Specific Question:   Reason for Exam (SYMPTOM  OR DIAGNOSIS REQUIRED)    Answer:   post menopausal bleeding    Order Specific Question:   Preferred imaging location?    Answer:   Internal  . US Transvaginal Non-OB    Standing Status:   Future    Standing Expiration Date:   12/29/2017    Order Specific Question:   Reason for Exam (SYMPTOM  OR DIAGNOSIS REQUIRED)    Answer:   PMB    Order Specific Question:   Preferred imaging location?    Answer:   Internal

## 2016-11-05 ENCOUNTER — Ambulatory Visit (INDEPENDENT_AMBULATORY_CARE_PROVIDER_SITE_OTHER): Payer: Medicare Other | Admitting: Obstetrics & Gynecology

## 2016-11-05 ENCOUNTER — Other Ambulatory Visit: Payer: Self-pay | Admitting: Obstetrics & Gynecology

## 2016-11-05 ENCOUNTER — Ambulatory Visit (INDEPENDENT_AMBULATORY_CARE_PROVIDER_SITE_OTHER): Payer: Medicare Other

## 2016-11-05 ENCOUNTER — Encounter: Payer: Self-pay | Admitting: Obstetrics & Gynecology

## 2016-11-05 ENCOUNTER — Telehealth: Payer: Self-pay | Admitting: Obstetrics & Gynecology

## 2016-11-05 VITALS — BP 150/80 | HR 80 | Wt 333.2 lb

## 2016-11-05 DIAGNOSIS — I10 Essential (primary) hypertension: Secondary | ICD-10-CM | POA: Diagnosis not present

## 2016-11-05 DIAGNOSIS — N950 Postmenopausal bleeding: Secondary | ICD-10-CM

## 2016-11-05 DIAGNOSIS — N854 Malposition of uterus: Secondary | ICD-10-CM

## 2016-11-05 MED ORDER — GENTAMICIN SULFATE 0.3 % OP SOLN: 1.0000 [drp] | Freq: Three times a day (TID) | OPHTHALMIC | 0 refills | Status: DC

## 2016-11-05 MED ORDER — NYSTATIN-TRIAMCINOLONE 100000-0.1 UNIT/GM-% EX OINT
1.0000 | TOPICAL_OINTMENT | Freq: Two times a day (BID) | CUTANEOUS | 11 refills | Status: DC
Start: 2016-11-05 — End: 2017-05-13

## 2016-11-05 MED ORDER — MICONAZOLE NITRATE 2 % EX CREA: 1.0000 "application " | TOPICAL_CREAM | Freq: Two times a day (BID) | CUTANEOUS | 0 refills | Status: DC

## 2016-11-05 NOTE — Progress Notes (Signed)
PELVIC US TA/TV: Homogeneous anteverted uterus,wnl,normal ov's bilat,EEC 3.4 mm,no free fluid seen,no pain during ultrasound,ovaries appear mobile

## 2016-11-05 NOTE — Progress Notes (Signed)
Follow up appointment for results  Chief Complaint  Patient presents with  . Follow-up    ultrasound    Blood pressure (!) 150/80, pulse 80, weight (!) 333 lb 3.2 oz (151.1 kg).  US Transvaginal Non-ob  Result Date: 11/05/2016 GYNECOLOGIC SONOGRAM Megan Oneal is a 60 y.o. ,she is here for a pelvic sonogram for postmenopausal bleeding . Uterus                      6.5 x 3.5 x 4.6 cm, homogeneous anteverted uterus,wnl Endometrium          3.4 mm, symmetrical, wnl Right ovary             2.1 x 1.1 x 1.5 cm,  wnl Left ovary                2.6 x 1.4 x 3 cm, wnl No free fluid,no pain during ultrasound Technician Comments: PELVIC US TA/TV: Homogeneous anteverted uterus,wnl,normal ov's bilat,EEC 3.4 mm,no free fluid seen,no pain during ultrasound,ovaries appear mobile [employer] 11/05/2016 11:22 AM Clinical Impression and recommendations: I have reviewed the sonogram results above, combined with the patient's current clinical course, below are my impressions and any appropriate recommendations for management based on the sonographic findings. Normal uterus with thin emdometrium Normal ovaries bilaterally No indication for endometrial sampling Rayvn Rickerson H 11/05/2016 12:03 PM   US Pelvis Complete  Result Date: 11/05/2016 GYNECOLOGIC SONOGRAM Megan Oneal is a 60 y.o. ,she is here for a pelvic sonogram for postmenopausal bleeding . Uterus                      6.5 x 3.5 x 4.6 cm, homogeneous anteverted uterus,wnl Endometrium          3.4 mm, symmetrical, wnl Right ovary             2.1 x 1.1 x 1.5 cm,  wnl Left ovary                2.6 x 1.4 x 3 cm, wnl No free fluid,no pain during ultrasound Technician Comments: PELVIC US TA/TV: Homogeneous anteverted uterus,wnl,normal ov's bilat,EEC 3.4 mm,no free fluid seen,no pain during ultrasound,ovaries appear mobile [employer] 11/05/2016 11:22 AM Clinical Impression and recommendations: I have reviewed the sonogram results above, combined with the patient's  current clinical course, below are my impressions and any appropriate recommendations for management based on the sonographic findings. Normal uterus with thin emdometrium Normal ovaries bilaterally No indication for endometrial sampling Reynold Mantell H 11/05/2016 12:03 PM      MEDS ordered this encounter: Meds ordered this encounter  Medications  . gentamicin (GARAMYCIN) 0.3 % ophthalmic solution    Sig: Place 1 drop into the right eye 3 (three) times daily.    Dispense:  5 mL    Refill:  0  . nystatin-triamcinolone ointment (MYCOLOG)    Sig: Apply 1 application topically 2 (two) times daily.    Dispense:  30 g    Refill:  11    Orders for this encounter: No orders of the defined types were placed in this encounter.   Impression: 1. Postmenopausal bleeding On provera 2.5 mg chronically, thin endometrium does not need repeat sampling   Plan: Routine 6 months exam  Follow Up: Return in about 6 months (around 05/05/2017) for yearly, with Dr Elonda Husky.       Face to face time:  10 minutes  Greater than 50% of the  visit time was spent in counseling and coordination of care with the patient.  The summary and outline of the counseling and care coordination is summarized in the note above.   All questions were answered.  Past Medical History:  Diagnosis Date  . Anxiety 03/12/2013  . Degenerative disc disease, lumbar    Epidural injections in 2007  . Diabetes mellitus without complication ([place])   . Endometriosis   . Frequent PVCs    12,000/24 hours  . Hypertension   . Otalgia, unspecified   . Palpitations 10/04/2010  . Vascular bruit 01/2013   Right neck    Past Surgical History:  Procedure Laterality Date  . BREAST SURGERY     benign tumor excised-age 24  . CARDIOVASCULAR STRESS TEST  03/13/2008   No scintigraphic evidence of inducible myocardial ischemia, EKG negative for ischemia, no ECG changes.  . COLONOSCOPY  2009  . CYST EXCISION  2007   Scalp  . DOPPLER  ECHOCARDIOGRAPHY  03/13/2008   EF [id number], LV systolic function is normal, LV wall function is normal  . LAPAROSCOPIC CHOLECYSTECTOMY  2010  . LUMBAR LAMINECTOMY  2009    L4-L5 and L5-S1 laminectomy with diskectomy; transforaminal  . REPLACEMENT TOTAL KNEE    . SLEEP STUDY  10/29/2009   AHI-1.8/hr, during REM 6.4/hr; RDI-8.0/hr, during REM 15.0/hr; avg oxygen during REM and NREM was 94%  . TOTAL KNEE ARTHROPLASTY Bilateral   . TUBAL LIGATION      OB History    No data available      Allergies  Allergen Reactions  . Metoprolol Other (See Comments)    Fatigue and malaise; wheezing  . Sulfa Antibiotics     Social History   Social History  . Marital status: Divorced    Spouse name: N/A  . Number of children: 3  . Years of education: N/A   Social History Main Topics  . Smoking status: Former Smoker    Packs/day: 2.00    Years: 20.00    Types: Cigarettes  . Smokeless tobacco: Former Systems developer    Quit date: 12/21/1994  . Alcohol use No  . Drug use: No  . Sexual activity: Yes   Other Topics Concern  . None   Social History Narrative  . None    Family History  Problem Relation Age of Onset  . Diabetes Mother   . Hypertension Mother   . Diabetes Brother   . Hypertension Brother   . Hypertension Father

## 2016-11-11 DIAGNOSIS — R7309 Other abnormal glucose: Secondary | ICD-10-CM | POA: Diagnosis not present

## 2016-11-11 DIAGNOSIS — Z0001 Encounter for general adult medical examination with abnormal findings: Secondary | ICD-10-CM | POA: Diagnosis not present

## 2016-11-11 DIAGNOSIS — Z23 Encounter for immunization: Secondary | ICD-10-CM | POA: Diagnosis not present

## 2016-11-11 DIAGNOSIS — E559 Vitamin D deficiency, unspecified: Secondary | ICD-10-CM | POA: Diagnosis not present

## 2016-11-11 DIAGNOSIS — E782 Mixed hyperlipidemia: Secondary | ICD-10-CM | POA: Diagnosis not present

## 2016-11-11 DIAGNOSIS — Z1389 Encounter for screening for other disorder: Secondary | ICD-10-CM | POA: Diagnosis not present

## 2017-01-30 ENCOUNTER — Other Ambulatory Visit: Payer: Self-pay | Admitting: Obstetrics & Gynecology

## 2017-04-06 ENCOUNTER — Other Ambulatory Visit (HOSPITAL_COMMUNITY): Payer: Self-pay | Admitting: Family Medicine

## 2017-04-06 DIAGNOSIS — Z1231 Encounter for screening mammogram for malignant neoplasm of breast: Secondary | ICD-10-CM

## 2017-04-12 ENCOUNTER — Ambulatory Visit (HOSPITAL_COMMUNITY): Payer: Medicare Other

## 2017-04-23 DIAGNOSIS — G894 Chronic pain syndrome: Secondary | ICD-10-CM | POA: Diagnosis not present

## 2017-04-23 DIAGNOSIS — Z1389 Encounter for screening for other disorder: Secondary | ICD-10-CM | POA: Diagnosis not present

## 2017-04-23 DIAGNOSIS — R7309 Other abnormal glucose: Secondary | ICD-10-CM | POA: Diagnosis not present

## 2017-04-23 DIAGNOSIS — H6123 Impacted cerumen, bilateral: Secondary | ICD-10-CM | POA: Diagnosis not present

## 2017-04-23 DIAGNOSIS — Z6841 Body Mass Index (BMI) 40.0 and over, adult: Secondary | ICD-10-CM | POA: Diagnosis not present

## 2017-04-23 DIAGNOSIS — M13812 Other specified arthritis, left shoulder: Secondary | ICD-10-CM | POA: Diagnosis not present

## 2017-04-29 ENCOUNTER — Ambulatory Visit (HOSPITAL_COMMUNITY): Payer: Medicare Other

## 2017-05-13 ENCOUNTER — Ambulatory Visit (INDEPENDENT_AMBULATORY_CARE_PROVIDER_SITE_OTHER): Payer: Medicare HMO | Admitting: Cardiology

## 2017-05-13 ENCOUNTER — Encounter: Payer: Self-pay | Admitting: Cardiology

## 2017-05-13 VITALS — BP 132/74 | HR 81 | Ht 67.0 in | Wt 331.0 lb

## 2017-05-13 DIAGNOSIS — R002 Palpitations: Secondary | ICD-10-CM

## 2017-05-13 DIAGNOSIS — I6523 Occlusion and stenosis of bilateral carotid arteries: Secondary | ICD-10-CM

## 2017-05-13 DIAGNOSIS — R0789 Other chest pain: Secondary | ICD-10-CM | POA: Diagnosis not present

## 2017-05-13 DIAGNOSIS — I10 Essential (primary) hypertension: Secondary | ICD-10-CM

## 2017-05-13 MED ORDER — VERAPAMIL HCL ER 240 MG PO TBCR: 240.0000 mg | EXTENDED_RELEASE_TABLET | Freq: Two times a day (BID) | ORAL | 3 refills | Status: DC

## 2017-05-13 NOTE — Progress Notes (Signed)
Clinical Summary Megan Oneal is a 61 y.o.female former patient of Dr Hamilton Capri. Seen today as a new patient, referred by Dr Hilma Favors for palpitations.   1. Chest pain - 2016 nuclear stress no ischemia - echo 2016 LVEF >59%, normal diastolic function - denies any recent chest pain.   2. Palpitations - most recent monitor with PVCs, no arrhythmias - previous notes mention a history of PSVT, details are unclear.   - still with palpitations. Sporadic, can be clustered. Most often occurs at night. Can last several hours at a time. - limited caffeine. No EtOH  3. OSA screen - she reports negative test in 2010  4. Carotid stenosis - carotid 2014 mild bilaterla disease - no recent neuro symptoms.  Past Medical History:  Diagnosis Date  . Anxiety 03/12/2013  . Degenerative disc disease, lumbar    Epidural injections in 2007  . Diabetes mellitus without complication ([place])   . Endometriosis   . Frequent PVCs    12,000/24 hours  . Hypertension   . Otalgia, unspecified   . Palpitations 10/04/2010  . Vascular bruit 01/2013   Right neck     Allergies  Allergen Reactions  . Metoprolol Other (See Comments)    Fatigue and malaise; wheezing  . Sulfa Antibiotics      Current Outpatient Prescriptions  Medication Sig Dispense Refill  . ALPRAZolam (XANAX) 0.5 MG tablet Take 0.5 mg by mouth 3 (three) times daily as needed for anxiety or sleep.     Marland Kitchen aspirin 81 MG tablet Take 81 mg by mouth daily.    Marland Kitchen dexlansoprazole (DEXILANT) 60 MG capsule Take 60 mg by mouth daily.    . ferrous sulfate 325 (65 FE) MG tablet Take 325 mg by mouth 2 (two) times daily with a meal.    . furosemide (LASIX) 40 MG tablet Take 40 mg by mouth daily.    Marland Kitchen gentamicin (GARAMYCIN) 0.3 % ophthalmic solution Place 1 drop into the right eye 3 (three) times daily. 5 mL 0  . HYDROcodone-acetaminophen (NORCO/VICODIN) 5-325 MG per tablet Take 1 tablet by mouth every 4 (four) hours as needed.   0  . losartan (COZAAR)  100 MG tablet Take 100 mg by mouth daily.    . Meclizine HCl 25 MG CHEW Chew 1 tablet by mouth as needed.   3  . medroxyPROGESTERone (PROVERA) 2.5 MG tablet TAKE 1 TABLET (2.5 MG TOTAL) BY MOUTH DAILY. 30 tablet 11  . miconazole (MICOTIN) 2 % cream Apply 1 application topically 2 (two) times daily. 28.35 g 0  . nystatin-triamcinolone ointment (MYCOLOG) Apply 1 application topically 2 (two) times daily. 30 g 11  . verapamil (CALAN-SR) 180 MG CR tablet Take 180 mg by mouth 2 (two) times daily.     No current facility-administered medications for this visit.      Past Surgical History:  Procedure Laterality Date  . BREAST SURGERY     benign tumor excised-age 64  . CARDIOVASCULAR STRESS TEST  03/13/2008   No scintigraphic evidence of inducible myocardial ischemia, EKG negative for ischemia, no ECG changes.  . COLONOSCOPY  2009  . CYST EXCISION  2007   Scalp  . DOPPLER ECHOCARDIOGRAPHY  03/13/2008   EF >93%, LV systolic function is normal, LV wall function is normal  . LAPAROSCOPIC CHOLECYSTECTOMY  2010  . LUMBAR LAMINECTOMY  2009    L4-L5 and L5-S1 laminectomy with diskectomy; transforaminal  . REPLACEMENT TOTAL KNEE    . SLEEP STUDY  10/29/2009  AHI-1.8/hr, during REM 6.4/hr; RDI-8.0/hr, during REM 15.0/hr; avg oxygen during REM and NREM was 94%  . TOTAL KNEE ARTHROPLASTY Bilateral   . TUBAL LIGATION       Allergies  Allergen Reactions  . Metoprolol Other (See Comments)    Fatigue and malaise; wheezing  . Sulfa Antibiotics       Family History  Problem Relation Age of Onset  . Diabetes Mother   . Hypertension Mother   . Diabetes Brother   . Hypertension Brother   . Hypertension Father      Social History [name] reports that she has quit smoking. Her smoking use included Cigarettes. She has a 40.00 pack-year smoking history. She quit smokeless tobacco use about 22 years ago. Ms. Trinidad reports that she does not drink alcohol.   Review of Systems CONSTITUTIONAL: No  weight loss, fever, chills, weakness or fatigue.  HEENT: Eyes: No visual loss, blurred vision, double vision or yellow sclerae.No hearing loss, sneezing, congestion, runny nose or sore throat.  SKIN: No rash or itching.  CARDIOVASCULAR: per hpi RESPIRATORY: No shortness of breath, cough or sputum.  GASTROINTESTINAL: No anorexia, nausea, vomiting or diarrhea. No abdominal pain or blood.  GENITOURINARY: No burning on urination, no polyuria NEUROLOGICAL: No headache, dizziness, syncope, paralysis, ataxia, numbness or tingling in the extremities. No change in bowel or bladder control.  MUSCULOSKELETAL: No muscle, back pain, joint pain or stiffness.  LYMPHATICS: No enlarged nodes. No history of splenectomy.  PSYCHIATRIC: No history of depression or anxiety.  ENDOCRINOLOGIC: No reports of sweating, cold or heat intolerance. No polyuria or polydipsia.  Marland Kitchen   Physical Examination Vitals:   05/13/17 0810  BP: 132/74  Pulse: 81   Vitals:   05/13/17 0810  Weight: (!) 331 lb (150.1 kg)  Height: 5\' 7"  (1.702 m)    Gen: resting comfortably, no acute distress HEENT: no scleral icterus, pupils equal round and reactive, no palptable cervical adenopathy,  CV: RRR, 2/6 systolic murmur RUSB, no jvd. Right carotid bruit Resp: Clear to auscultation bilaterally GI: abdomen is soft, non-tender, non-distended, normal bowel sounds, no hepatosplenomegaly MSK: extremities are warm, no edema.  Skin: warm, no rash Neuro:  no focal deficits Psych: appropriate affect    Assessment and Plan  1. Chest pain - previous negative stress test - no recent symptoms - continue ot monitor  2. Palpitations - ongoing symptoms. EKG in clinic shows NSR - we will increase verapmil to 240mg  bid - reported sie effects to lopressor, she is willing ot retry low dose if neccesayr  3. Carotid stenosis - asymptomatic. Continue to monitor.    F/u 6 months   Arnoldo Lenis, M.D.

## 2017-05-13 NOTE — Patient Instructions (Signed)
Your physician wants you to follow-up in: 6 months Dr Bryna Colander will receive a reminder letter in the mail two months in advance. If you don't receive a letter, please call our office to schedule the follow-up appointment.   INCREASE Verapamil to 240 mg twice a day    If you need a refill on your cardiac medications before your next appointment, please call your pharmacy.     Thank you for choosing [facility] !

## 2017-09-03 ENCOUNTER — Other Ambulatory Visit (HOSPITAL_COMMUNITY)
Admission: RE | Admit: 2017-09-03 | Discharge: 2017-09-03 | Disposition: A | Discharge status: Home / Self Care | Payer: Medicare HMO | Source: Ambulatory Visit | Attending: Obstetrics & Gynecology | Admitting: Obstetrics & Gynecology

## 2017-09-03 ENCOUNTER — Encounter: Payer: Self-pay | Admitting: Obstetrics & Gynecology

## 2017-09-03 ENCOUNTER — Ambulatory Visit (INDEPENDENT_AMBULATORY_CARE_PROVIDER_SITE_OTHER): Payer: Medicare HMO | Admitting: Obstetrics & Gynecology

## 2017-09-03 VITALS — BP 142/78 | HR 90 | Ht 66.0 in | Wt 339.0 lb

## 2017-09-03 DIAGNOSIS — B3731 Acute candidiasis of vulva and vagina: Secondary | ICD-10-CM

## 2017-09-03 DIAGNOSIS — Z01419 Encounter for gynecological examination (general) (routine) without abnormal findings: Secondary | ICD-10-CM

## 2017-09-03 DIAGNOSIS — Z01411 Encounter for gynecological examination (general) (routine) with abnormal findings: Secondary | ICD-10-CM

## 2017-09-03 DIAGNOSIS — B373 Candidiasis of vulva and vagina: Secondary | ICD-10-CM | POA: Diagnosis not present

## 2017-09-03 DIAGNOSIS — Z1239 Encounter for other screening for malignant neoplasm of breast: Secondary | ICD-10-CM

## 2017-09-03 MED ORDER — FLUCONAZOLE 100 MG PO TABS: 100.0000 mg | ORAL_TABLET | Freq: Every day | ORAL | 0 refills | Status: DC

## 2017-09-03 NOTE — Progress Notes (Signed)
Subjective:     Megan Oneal is a 61 y.o. female here for a routine exam.  No LMP recorded. Patient is postmenopausal. G3P3 Birth Control Method:  Post menopausal Menstrual Calendar(currently): amenorrheic  Current complaints: vaginal itching without discharge.   Current acute medical issues:  none   Recent Gynecologic History No LMP recorded. Patient is postmenopausal. Last Pap: 2015,  normal Last mammogram: 2017,  normal  Past Medical History:  Diagnosis Date  . Anxiety 03/12/2013  . Degenerative disc disease, lumbar    Epidural injections in 2007  . Diabetes mellitus without complication ([place])   . Endometriosis   . Frequent PVCs    12,000/24 hours  . Hypertension   . Otalgia, unspecified   . Palpitations 10/04/2010  . Vascular bruit 01/2013   Right neck    Past Surgical History:  Procedure Laterality Date  . BREAST SURGERY     benign tumor excised-age 40  . CARDIOVASCULAR STRESS TEST  03/13/2008   No scintigraphic evidence of inducible myocardial ischemia, EKG negative for ischemia, no ECG changes.  . COLONOSCOPY  2009  . CYST EXCISION  2007   Scalp  . DOPPLER ECHOCARDIOGRAPHY  03/13/2008   EF >79%, LV systolic function is normal, LV wall function is normal  . LAPAROSCOPIC CHOLECYSTECTOMY  2010  . LUMBAR LAMINECTOMY  2009    L4-L5 and L5-S1 laminectomy with diskectomy; transforaminal  . REPLACEMENT TOTAL KNEE    . SLEEP STUDY  10/29/2009   AHI-1.8/hr, during REM 6.4/hr; RDI-8.0/hr, during REM 15.0/hr; avg oxygen during REM and NREM was 94%  . TOTAL KNEE ARTHROPLASTY Bilateral   . TUBAL LIGATION      OB History    Gravida Para Term Preterm AB Living   3 3       3    SAB TAB Ectopic Multiple Live Births                  Social History   Social History  . Marital status: Divorced    Spouse name: N/A  . Number of children: 3  . Years of education: N/A   Social History Main Topics  . Smoking status: Former Smoker    Packs/day: 2.00    Years: 20.00   Types: Cigarettes    Start date: 08/03/1972    Quit date: 08/03/1996  . Smokeless tobacco: Former Systems developer    Quit date: 12/21/1994  . Alcohol use No  . Drug use: No  . Sexual activity: Not Currently    Birth control/ protection: Post-menopausal   Other Topics Concern  . None   Social History Narrative  . None    Family History  Problem Relation Age of Onset  . Diabetes Mother   . Hypertension Mother   . Diabetes Brother   . Hypertension Brother   . Hypertension Father      Current Outpatient Prescriptions:  .  ALPRAZolam (XANAX) 0.5 MG tablet, Take 0.5 mg by mouth 3 (three) times daily as needed for anxiety or sleep. , Disp: , Rfl:  .  aspirin 81 MG tablet, Take 81 mg by mouth daily., Disp: , Rfl:  .  dexlansoprazole (DEXILANT) 60 MG capsule, Take 60 mg by mouth daily., Disp: , Rfl:  .  ferrous sulfate 325 (65 FE) MG tablet, Take 325 mg by mouth 2 (two) times daily with a meal., Disp: , Rfl:  .  furosemide (LASIX) 40 MG tablet, Take 40 mg by mouth daily., Disp: , Rfl:  .  HYDROcodone-acetaminophen (  NORCO/VICODIN) 5-325 MG per tablet, Take 1 tablet by mouth every 4 (four) hours as needed. , Disp: , Rfl: 0 .  losartan (COZAAR) 100 MG tablet, Take 100 mg by mouth daily., Disp: , Rfl:  .  Meclizine HCl 25 MG CHEW, Chew 1 tablet by mouth as needed. , Disp: , Rfl: 3 .  medroxyPROGESTERone (PROVERA) 2.5 MG tablet, TAKE 1 TABLET (2.5 MG TOTAL) BY MOUTH DAILY., Disp: 30 tablet, Rfl: 11 .  verapamil (CALAN-SR) 240 MG CR tablet, Take 1 tablet (240 mg total) by mouth 2 (two) times daily., Disp: 180 tablet, Rfl: 3 .  Vitamin D, Ergocalciferol, (DRISDOL) 50000 units CAPS capsule, Take 1 capsule by mouth once a week., Disp: , Rfl: 12 .  fluconazole (DIFLUCAN) 100 MG tablet, Take 1 tablet (100 mg total) by mouth daily., Disp: 7 tablet, Rfl: 0  Review of Systems  Review of Systems  Constitutional: Negative for fever, chills, weight loss, malaise/fatigue and diaphoresis.  HENT: Negative for  hearing loss, ear pain, nosebleeds, congestion, sore throat, neck pain, tinnitus and ear discharge.   Eyes: Negative for blurred vision, double vision, photophobia, pain, discharge and redness.  Respiratory: Negative for cough, hemoptysis, sputum production, shortness of breath, wheezing and stridor.   Cardiovascular: Negative for chest pain, palpitations, orthopnea, claudication, leg swelling and PND.  Gastrointestinal: negative for abdominal pain. Negative for heartburn, nausea, vomiting, diarrhea, constipation, blood in stool and melena.  Genitourinary: Negative for dysuria, urgency, frequency, hematuria and flank pain.  Musculoskeletal: Negative for myalgias, back pain, joint pain and falls.  Skin: Negative for itching and rash.  Neurological: Negative for dizziness, tingling, tremors, sensory change, speech change, focal weakness, seizures, loss of consciousness, weakness and headaches.  Endo/Heme/Allergies: Negative for environmental allergies and polydipsia. Does not bruise/bleed easily.  Psychiatric/Behavioral: Negative for depression, suicidal ideas, hallucinations, memory loss and substance abuse. The patient is not nervous/anxious and does not have insomnia.        Objective:  Blood pressure (!) 142/78, pulse 90, height 5\' 6"  (1.676 m), weight (!) 339 lb (153.8 kg).   Physical Exam  Vitals reviewed. Constitutional: She is oriented to person, place, and time. She appears well-developed and well-nourished.  HENT:  Head: Normocephalic and atraumatic.        Right Ear: External ear normal.  Left Ear: External ear normal.  Nose: Nose normal.  Mouth/Throat: Oropharynx is clear and moist.  Eyes: Conjunctivae and EOM are normal. Pupils are equal, round, and reactive to light. Right eye exhibits no discharge. Left eye exhibits no discharge. No scleral icterus.  Neck: Normal range of motion. Neck supple. No tracheal deviation present. No thyromegaly present.  Cardiovascular: Normal rate,  regular rhythm, normal heart sounds and intact distal pulses.  Exam reveals no gallop and no friction rub.   No murmur heard. Respiratory: Effort normal and breath sounds normal. No respiratory distress. She has no wheezes. She has no rales. She exhibits no tenderness.  GI: Soft. Bowel sounds are normal. She exhibits no distension and no mass. There is no tenderness. There is no rebound and no guarding.  Genitourinary:  Breasts no masses skin changes or nipple changes bilaterally      Vulva is normal without lesions Vagina is pink moist without discharge Cervix normal in appearance and pap is done Uterus is normal size shape and contour Adnexa is negative with normal sized ovaries  Painted vulva and vagina with gentian violet for yeast Musculoskeletal: Normal range of motion. She exhibits no edema and no  tenderness.  Neurological: She is alert and oriented to person, place, and time. She has normal reflexes. She displays normal reflexes. No cranial nerve deficit. She exhibits normal muscle tone. Coordination normal.  Skin: Skin is warm and dry. No rash noted. No erythema. No pallor.  Psychiatric: She has a normal mood and affect. Her behavior is normal. Judgment and thought content normal.       Medications Ordered at today's visit: Meds ordered this encounter  Medications  . fluconazole (DIFLUCAN) 100 MG tablet    Sig: Take 1 tablet (100 mg total) by mouth daily.    Dispense:  7 tablet    Refill:  0    Other orders placed at today's visit: Orders Placed This Encounter  Procedures  . MM Digital Screening      Assessment:    Healthy female exam.   yeast vulvovaginitis Plan:    Mammogram ordered. Follow up in: 3 years.   or as needed   Return in about 3 years (around 09/03/2020) for yearly, with Dr Elonda Husky.

## 2017-09-07 LAB — CYTOLOGY - PAP
Diagnosis: UNDETERMINED — AB
HPV (WINDOPATH): NOT DETECTED

## 2017-09-10 DIAGNOSIS — R69 Illness, unspecified: Secondary | ICD-10-CM | POA: Diagnosis not present

## 2017-11-15 DIAGNOSIS — M545 Low back pain: Secondary | ICD-10-CM | POA: Diagnosis not present

## 2017-11-15 DIAGNOSIS — E782 Mixed hyperlipidemia: Secondary | ICD-10-CM | POA: Diagnosis not present

## 2017-11-15 DIAGNOSIS — E785 Hyperlipidemia, unspecified: Secondary | ICD-10-CM | POA: Diagnosis not present

## 2017-11-15 DIAGNOSIS — J302 Other seasonal allergic rhinitis: Secondary | ICD-10-CM | POA: Diagnosis not present

## 2017-11-15 DIAGNOSIS — R69 Illness, unspecified: Secondary | ICD-10-CM | POA: Diagnosis not present

## 2017-11-15 DIAGNOSIS — Z1389 Encounter for screening for other disorder: Secondary | ICD-10-CM | POA: Diagnosis not present

## 2017-11-15 DIAGNOSIS — R7309 Other abnormal glucose: Secondary | ICD-10-CM | POA: Diagnosis not present

## 2017-11-15 DIAGNOSIS — K219 Gastro-esophageal reflux disease without esophagitis: Secondary | ICD-10-CM | POA: Diagnosis not present

## 2017-11-15 DIAGNOSIS — Z6841 Body Mass Index (BMI) 40.0 and over, adult: Secondary | ICD-10-CM | POA: Diagnosis not present

## 2017-11-15 DIAGNOSIS — E559 Vitamin D deficiency, unspecified: Secondary | ICD-10-CM | POA: Diagnosis not present

## 2017-11-15 DIAGNOSIS — I10 Essential (primary) hypertension: Secondary | ICD-10-CM | POA: Diagnosis not present

## 2017-11-30 ENCOUNTER — Ambulatory Visit: Payer: Medicare Other | Admitting: Obstetrics & Gynecology

## 2017-12-06 ENCOUNTER — Ambulatory Visit: Payer: Medicare HMO | Admitting: Obstetrics & Gynecology

## 2017-12-06 ENCOUNTER — Other Ambulatory Visit: Payer: Self-pay

## 2017-12-06 ENCOUNTER — Encounter: Payer: Self-pay | Admitting: Obstetrics & Gynecology

## 2017-12-06 VITALS — BP 146/78 | HR 90 | Ht 66.0 in | Wt 335.0 lb

## 2017-12-06 DIAGNOSIS — N952 Postmenopausal atrophic vaginitis: Secondary | ICD-10-CM | POA: Diagnosis not present

## 2017-12-06 DIAGNOSIS — N760 Acute vaginitis: Secondary | ICD-10-CM | POA: Diagnosis not present

## 2017-12-06 MED ORDER — ESTROGENS, CONJUGATED 0.625 MG/GM VA CREA: 1.0000 | TOPICAL_CREAM | Freq: Every day | VAGINAL | 12 refills | Status: DC

## 2017-12-06 MED ORDER — DOXYCYCLINE HYCLATE 100 MG PO TABS: 100.0000 mg | ORAL_TABLET | Freq: Two times a day (BID) | ORAL | 0 refills | Status: DC

## 2017-12-06 NOTE — Progress Notes (Signed)
Chief Complaint  Patient presents with  . vaginal burning      61 y.o. G3P3 No LMP recorded. Patient is postmenopausal. The current method of family planning is post menopausal status.  Outpatient Encounter Medications as of 12/06/2017  Medication Sig Note  . ALPRAZolam (XANAX) 0.5 MG tablet Take 0.5 mg by mouth 3 (three) times daily as needed for anxiety or sleep.    Marland Kitchen aspirin 81 MG tablet Take 81 mg by mouth daily.   Marland Kitchen dexlansoprazole (DEXILANT) 60 MG capsule Take 60 mg by mouth daily.   . ferrous sulfate 325 (65 FE) MG tablet Take 325 mg by mouth 2 (two) times daily with a meal.   . furosemide (LASIX) 40 MG tablet Take 40 mg by mouth daily.   Marland Kitchen HYDROcodone-acetaminophen (NORCO/VICODIN) 5-325 MG per tablet Take 1 tablet by mouth every 4 (four) hours as needed.  12/31/2014: Received from: External Pharmacy  . losartan (COZAAR) 100 MG tablet Take 100 mg by mouth daily.   . Meclizine HCl 25 MG CHEW Chew 1 tablet by mouth as needed.  12/31/2014: Received from: External Pharmacy  . medroxyPROGESTERone (PROVERA) 2.5 MG tablet TAKE 1 TABLET (2.5 MG TOTAL) BY MOUTH DAILY.   . verapamil (CALAN-SR) 240 MG CR tablet Take 1 tablet (240 mg total) by mouth 2 (two) times daily.   . Vitamin D, Ergocalciferol, (DRISDOL) 50000 units CAPS capsule Take 1 capsule by mouth once a week.   . conjugated estrogens (PREMARIN) vaginal cream Place 1 Applicatorful vaginally daily.   Marland Kitchen doxycycline (VIBRA-TABS) 100 MG tablet Take 1 tablet (100 mg total) by mouth 2 (two) times daily. (Patient not taking: Reported on 12/28/2017)   . [DISCONTINUED] fluconazole (DIFLUCAN) 100 MG tablet Take 1 tablet (100 mg total) by mouth daily.    No facility-administered encounter medications on file as of 12/06/2017.     Subjective Megan Oneal is in today for evaluation of a irritating discharge Some itching mainly irritating Been going on now for a week or so She of course is very sensitive to it because she has a history  in the relatively recent past of trichomonas She has not had intercourse since treatment for that and she had a test of cure which was negative so I doubt that is the problem She denies any bleeding She states the discharge is all the time and sometimes irritation is worse than others She has not done anything to make it better or worse Past Medical History:  Diagnosis Date  . Anxiety 03/12/2013  . Degenerative disc disease, lumbar    Epidural injections in 2007  . Diabetes mellitus without complication ([place])   . Endometriosis   . Frequent PVCs    12,000/24 hours  . Hypertension   . Otalgia, unspecified   . Palpitations 10/04/2010  . Vascular bruit 01/2013   Right neck    Past Surgical History:  Procedure Laterality Date  . BREAST SURGERY     benign tumor excised-age 67  . CARDIOVASCULAR STRESS TEST  03/13/2008   No scintigraphic evidence of inducible myocardial ischemia, EKG negative for ischemia, no ECG changes.  . COLONOSCOPY  2009  . CYST EXCISION  2007   Scalp  . DOPPLER ECHOCARDIOGRAPHY  03/13/2008   EF >99%, LV systolic function is normal, LV wall function is normal  . LAPAROSCOPIC CHOLECYSTECTOMY  2010  . LUMBAR LAMINECTOMY  2009    L4-L5 and L5-S1 laminectomy with diskectomy; transforaminal  . REPLACEMENT TOTAL KNEE    .  SLEEP STUDY  10/29/2009   AHI-1.8/hr, during REM 6.4/hr; RDI-8.0/hr, during REM 15.0/hr; avg oxygen during REM and NREM was 94%  . TOTAL KNEE ARTHROPLASTY Bilateral   . TUBAL LIGATION      OB History    Gravida Para Term Preterm AB Living   3 3       3    SAB TAB Ectopic Multiple Live Births                  Allergies  Allergen Reactions  . Metoprolol Other (See Comments)    Fatigue and malaise; wheezing  . Sulfa Antibiotics     Social History   Socioeconomic History  . Marital status: Divorced    Spouse name: None  . Number of children: 3  . Years of education: None  . Highest education level: None  Social Needs  . Financial  resource strain: None  . Food insecurity - worry: None  . Food insecurity - inability: None  . Transportation needs - medical: None  . Transportation needs - non-medical: None  Occupational History  . None  Tobacco Use  . Smoking status: Former Smoker    Packs/day: 2.00    Years: 20.00    Pack years: 40.00    Types: Cigarettes    Start date: 08/03/1972    Last attempt to quit: 08/03/1996    Years since quitting: 21.4  . Smokeless tobacco: Former Systems developer    Quit date: 12/21/1994  Substance and Sexual Activity  . Alcohol use: No  . Drug use: No  . Sexual activity: Not Currently    Birth control/protection: Post-menopausal  Other Topics Concern  . None  Social History Narrative  . None    Family History  Problem Relation Age of Onset  . Diabetes Mother   . Hypertension Mother   . Diabetes Brother   . Hypertension Brother   . Hypertension Father     Medications:       Current Outpatient Medications:  .  ALPRAZolam (XANAX) 0.5 MG tablet, Take 0.5 mg by mouth 3 (three) times daily as needed for anxiety or sleep. , Disp: , Rfl:  .  aspirin 81 MG tablet, Take 81 mg by mouth daily., Disp: , Rfl:  .  dexlansoprazole (DEXILANT) 60 MG capsule, Take 60 mg by mouth daily., Disp: , Rfl:  .  ferrous sulfate 325 (65 FE) MG tablet, Take 325 mg by mouth 2 (two) times daily with a meal., Disp: , Rfl:  .  furosemide (LASIX) 40 MG tablet, Take 40 mg by mouth daily., Disp: , Rfl:  .  HYDROcodone-acetaminophen (NORCO/VICODIN) 5-325 MG per tablet, Take 1 tablet by mouth every 4 (four) hours as needed. , Disp: , Rfl: 0 .  losartan (COZAAR) 100 MG tablet, Take 100 mg by mouth daily., Disp: , Rfl:  .  Meclizine HCl 25 MG CHEW, Chew 1 tablet by mouth as needed. , Disp: , Rfl: 3 .  medroxyPROGESTERone (PROVERA) 2.5 MG tablet, TAKE 1 TABLET (2.5 MG TOTAL) BY MOUTH DAILY., Disp: 30 tablet, Rfl: 11 .  verapamil (CALAN-SR) 240 MG CR tablet, Take 1 tablet (240 mg total) by mouth 2 (two) times daily., Disp:  180 tablet, Rfl: 3 .  Vitamin D, Ergocalciferol, (DRISDOL) 50000 units CAPS capsule, Take 1 capsule by mouth once a week., Disp: , Rfl: 12 .  conjugated estrogens (PREMARIN) vaginal cream, Place 1 Applicatorful vaginally daily., Disp: 42.5 g, Rfl: 12 .  doxycycline (VIBRA-TABS) 100 MG tablet, Take 1  tablet (100 mg total) by mouth 2 (two) times daily. (Patient not taking: Reported on 12/28/2017), Disp: 20 tablet, Rfl: 0  Objective Blood pressure (!) 146/78, pulse 90, height 5\' 6"  (1.676 m), weight (!) 335 lb (152 kg).  General WDWN female NAD Vulva:  normal appearing vulva with no masses, tenderness or lesions Vagina:  normal mucosa, thin grey discharge Cervix:  no cervical motion tenderness and no lesions Uterus:  normal size, contour, position, consistency, mobility, non-tender Adnexa: ovaries:present,  normal adnexa in size, nontender and no masses   Pertinent ROS No burning with urination, frequency or urgency No nausea, vomiting or diarrhea Nor fever chills or other constitutional symptoms   Labs or studies Wet Prep:   A sample of vaginal discharge was obtained from the posterior fornix using a cotton swab. 2 drops of saline were placed on a slide and the cotton swab was immersed in the saline. Microscopic evaluation was performed and results were as follows:  Negative  for yeast  Negative for clue cells , consistent with Bacterial vaginosis Negative for trichomonas  Abnormal- high WBC population   Whiff test: Negative     Impression Diagnoses this Encounter::   ICD-10-CM   1. Acute vaginitis N76.0   2. Vaginal atrophy N95.2     Established relevant diagnosis(es): History of trichomoniasis  Plan/Recommendations: Meds ordered this encounter  Medications  . doxycycline (VIBRA-TABS) 100 MG tablet    Sig: Take 1 tablet (100 mg total) by mouth 2 (two) times daily.    Dispense:  20 tablet    Refill:  0  . conjugated estrogens (PREMARIN) vaginal cream    Sig: Place 1  Applicatorful vaginally daily.    Dispense:  42.5 g    Refill:  12    Labs or Scans Ordered: No orders of the defined types were placed in this encounter.   Management:: In the short-term patient will take doxycycline for nonspecific cervicitis vaginitis However I think the long-term management lies in vaginal estrogen cream to prevent ongoing cervicitis issues  Follow up Return in about 3 weeks (around 12/27/2017) for with Dr Elonda Husky, Follow up.      All questions were answered.

## 2017-12-28 ENCOUNTER — Encounter: Payer: Self-pay | Admitting: Obstetrics & Gynecology

## 2017-12-28 ENCOUNTER — Ambulatory Visit: Payer: Medicare HMO | Admitting: Obstetrics & Gynecology

## 2017-12-28 VITALS — BP 136/80 | HR 80 | Ht 66.0 in | Wt 342.0 lb

## 2017-12-28 DIAGNOSIS — N952 Postmenopausal atrophic vaginitis: Secondary | ICD-10-CM

## 2017-12-28 NOTE — Progress Notes (Signed)
       Chief Complaint  Patient presents with  . Follow-up    Blood pressure 136/80, pulse 80, height 5\' 6"  (1.676 m), weight (!) 342 lb (155.1 kg).  62 y.o. G3P3 No LMP recorded. Patient is postmenopausal. The current method of family planning is menopause.  Subjective Vaginal discharge for several months  Itching no Irritation yes Odor no Similar to previous no  Previous treatment Metronidazole, doxycycline, premarin vaginal cream  Objective Vulva:  normal appearing vulva with no masses, tenderness or lesions Vagina:  normal mucosa, no discharge Cervix:  no cervical motion tenderness and no lesions Uterus:   Adnexa: ovaries:,       Pertinent ROS No burning with urination, frequency or urgency No nausea, vomiting or diarrhea Nor fever chills or other constitutional symptoms   Labs or studies Wet Prep:   A sample of vaginal discharge was obtained from the posterior fornix using a cotton swab. 2 drops of saline were placed on a slide and the cotton swab was immersed in the saline. Microscopic evaluation was performed and results were as follows:  Negative  for yeast  Negative for clue cells , consistent with Bacterial vaginosis Negative for trichomonas  Normal WBC population   Whiff test: Negative     Impression Diagnoses this Encounter::   ICD-10-CM   1. Vaginal atrophy N95.2     Established relevant diagnosis(es):   Plan/Recommendations: No orders of the defined types were placed in this encounter.   Labs or Scans Ordered: No orders of the defined types were placed in this encounter.   Management:: Continue to use the premarin vaginal cream as directed Follow up 1 year for yearly  Follow up Return in about 1 year (around 12/28/2018) for yearly, with Dr Elonda Husky.         All questions were answered.

## 2018-01-05 ENCOUNTER — Telehealth: Payer: Self-pay | Admitting: Obstetrics & Gynecology

## 2018-01-05 NOTE — Telephone Encounter (Addendum)
Patient states she was seen in our office last week and has been continuing to use premarin cream as prescribed. She woke up this morning and thought she had urinated on herself but realized it was blood. States she is now only having light spotting. Was seen for postmenopausal bleeding in the past. Please advise.

## 2018-01-05 NOTE — Telephone Encounter (Signed)
Pt should also been on oral provera for proliferative endometrium so she will have some spotting bleeding from time to time beause of the need for chronic progesterone.  nt related to the premarin cream Not surprising

## 2018-01-06 DIAGNOSIS — Z1389 Encounter for screening for other disorder: Secondary | ICD-10-CM | POA: Diagnosis not present

## 2018-01-06 DIAGNOSIS — J069 Acute upper respiratory infection, unspecified: Secondary | ICD-10-CM | POA: Diagnosis not present

## 2018-01-06 DIAGNOSIS — E559 Vitamin D deficiency, unspecified: Secondary | ICD-10-CM | POA: Diagnosis not present

## 2018-01-06 DIAGNOSIS — Z0001 Encounter for general adult medical examination with abnormal findings: Secondary | ICD-10-CM | POA: Diagnosis not present

## 2018-01-06 DIAGNOSIS — Z6841 Body Mass Index (BMI) 40.0 and over, adult: Secondary | ICD-10-CM | POA: Diagnosis not present

## 2018-01-06 NOTE — Telephone Encounter (Signed)
Informed patient that per Dr Elonda Husky she is on oral provera for proliferative endometrium so she will have some spotting bleeding from time to time beause of the need for chronic progesterone. The bleeding is not related to the premarin cream. Patient states she cannot go on like this and is asking about a hysterectomy. Is that a possibility for this patient? Please advise.

## 2018-01-06 NOTE — Telephone Encounter (Signed)
#  1 tell Dicy not to push the panic button tell her to make an appointment to come in and see me, we have other options, she has just started bleeding again so other options wern't needed, so no I don't think she needs ahysterectomy tell her to come see me next week

## 2018-01-07 NOTE — Telephone Encounter (Signed)
Informed patient that DR Elonda Husky stated he would like to see patient next week to talk about other options. Verbalized understanding. Patient to come Monday.

## 2018-01-10 ENCOUNTER — Ambulatory Visit (INDEPENDENT_AMBULATORY_CARE_PROVIDER_SITE_OTHER): Payer: Medicare HMO | Admitting: Obstetrics & Gynecology

## 2018-01-10 ENCOUNTER — Encounter: Payer: Self-pay | Admitting: Obstetrics & Gynecology

## 2018-01-10 ENCOUNTER — Other Ambulatory Visit: Payer: Self-pay

## 2018-01-10 VITALS — BP 132/74 | HR 95 | Ht 66.0 in | Wt 335.0 lb

## 2018-01-10 DIAGNOSIS — N950 Postmenopausal bleeding: Secondary | ICD-10-CM | POA: Diagnosis not present

## 2018-01-10 MED ORDER — MEGESTROL ACETATE 20 MG PO TABS: 20.0000 mg | ORAL_TABLET | Freq: Every day | ORAL | 11 refills | Status: DC

## 2018-01-10 NOTE — Progress Notes (Signed)
Chief Complaint  Patient presents with  . Vaginal Bleeding      62 y.o. G3P3 No LMP recorded. Patient is postmenopausal. The current method of family planning is post menopausal status.  Outpatient Encounter Medications as of 01/10/2018  Medication Sig Note  . ALPRAZolam (XANAX) 0.5 MG tablet Take 0.5 mg by mouth 3 (three) times daily as needed for anxiety or sleep.    Marland Kitchen aspirin 81 MG tablet Take 81 mg by mouth daily.   . benzonatate (TESSALON) 100 MG capsule Take 100 mg by mouth 3 (three) times daily as needed for cough.   . conjugated estrogens (PREMARIN) vaginal cream Place 1 Applicatorful vaginally daily.   Marland Kitchen dexlansoprazole (DEXILANT) 60 MG capsule Take 60 mg by mouth daily.   . ferrous sulfate 325 (65 FE) MG tablet Take 325 mg by mouth 2 (two) times daily with a meal.   . furosemide (LASIX) 40 MG tablet Take 40 mg by mouth daily.   Marland Kitchen HYDROcodone-acetaminophen (NORCO/VICODIN) 5-325 MG per tablet Take 1 tablet by mouth every 4 (four) hours as needed.  12/31/2014: Received from: External Pharmacy  . losartan (COZAAR) 100 MG tablet Take 100 mg by mouth daily.   . Meclizine HCl 25 MG CHEW Chew 1 tablet by mouth as needed.  12/31/2014: Received from: External Pharmacy  . medroxyPROGESTERone (PROVERA) 2.5 MG tablet TAKE 1 TABLET (2.5 MG TOTAL) BY MOUTH DAILY.   . verapamil (CALAN-SR) 240 MG CR tablet Take 1 tablet (240 mg total) by mouth 2 (two) times daily.   . Vitamin D, Ergocalciferol, (DRISDOL) 50000 units CAPS capsule Take 1 capsule by mouth once a week.   . megestrol (MEGACE) 20 MG tablet Take 1 tablet (20 mg total) by mouth daily.   . [DISCONTINUED] doxycycline (VIBRA-TABS) 100 MG tablet Take 1 tablet (100 mg total) by mouth 2 (two) times daily. (Patient not taking: Reported on 12/28/2017)    No facility-administered encounter medications on file as of 01/10/2018.     Subjective Cato Mulligan had an episode of heavier bleeding last week with some spotting now with  cramping Taking provera 2.5 mg daily for endometrial suppreseeion ue to post menopausal peripheral fat production of estrone causing proliferative changes Failed prometriu in the past, will bump up to megestrol 20 mg and if nedded will go to megestrol 40 mg daily Past Medical History:  Diagnosis Date  . Anxiety 03/12/2013  . Degenerative disc disease, lumbar    Epidural injections in 2007  . Diabetes mellitus without complication ([place])   . Endometriosis   . Frequent PVCs    12,000/24 hours  . Hypertension   . Otalgia, unspecified   . Palpitations 10/04/2010  . Vascular bruit 01/2013   Right neck    Past Surgical History:  Procedure Laterality Date  . BREAST SURGERY     benign tumor excised-age 84  . CARDIOVASCULAR STRESS TEST  03/13/2008   No scintigraphic evidence of inducible myocardial ischemia, EKG negative for ischemia, no ECG changes.  . COLONOSCOPY  2009  . CYST EXCISION  2007   Scalp  . DOPPLER ECHOCARDIOGRAPHY  03/13/2008   EF >57%, LV systolic function is normal, LV wall function is normal  . LAPAROSCOPIC CHOLECYSTECTOMY  2010  . LUMBAR LAMINECTOMY  2009    L4-L5 and L5-S1 laminectomy with diskectomy; transforaminal  . REPLACEMENT TOTAL KNEE    . SLEEP STUDY  10/29/2009   AHI-1.8/hr, during REM 6.4/hr; RDI-8.0/hr, during REM 15.0/hr; avg oxygen during REM  and NREM was 94%  . TOTAL KNEE ARTHROPLASTY Bilateral   . TUBAL LIGATION      OB History    Gravida Para Term Preterm AB Living   3 3       3    SAB TAB Ectopic Multiple Live Births                  Allergies  Allergen Reactions  . Metoprolol Other (See Comments)    Fatigue and malaise; wheezing  . Sulfa Antibiotics     Social History   Socioeconomic History  . Marital status: Divorced    Spouse name: None  . Number of children: 3  . Years of education: None  . Highest education level: None  Social Needs  . Financial resource strain: None  . Food insecurity - worry: None  . Food insecurity -  inability: None  . Transportation needs - medical: None  . Transportation needs - non-medical: None  Occupational History  . None  Tobacco Use  . Smoking status: Former Smoker    Packs/day: 2.00    Years: 20.00    Pack years: 40.00    Types: Cigarettes    Start date: 08/03/1972    Last attempt to quit: 08/03/1996    Years since quitting: 21.4  . Smokeless tobacco: Former Systems developer    Quit date: 12/21/1994  Substance and Sexual Activity  . Alcohol use: No  . Drug use: No  . Sexual activity: Not Currently    Birth control/protection: Post-menopausal  Other Topics Concern  . None  Social History Narrative  . None    Family History  Problem Relation Age of Onset  . Diabetes Mother   . Hypertension Mother   . Diabetes Brother   . Hypertension Brother   . Hypertension Father     Medications:       Current Outpatient Medications:  .  ALPRAZolam (XANAX) 0.5 MG tablet, Take 0.5 mg by mouth 3 (three) times daily as needed for anxiety or sleep. , Disp: , Rfl:  .  aspirin 81 MG tablet, Take 81 mg by mouth daily., Disp: , Rfl:  .  benzonatate (TESSALON) 100 MG capsule, Take 100 mg by mouth 3 (three) times daily as needed for cough., Disp: , Rfl:  .  conjugated estrogens (PREMARIN) vaginal cream, Place 1 Applicatorful vaginally daily., Disp: 42.5 g, Rfl: 12 .  dexlansoprazole (DEXILANT) 60 MG capsule, Take 60 mg by mouth daily., Disp: , Rfl:  .  ferrous sulfate 325 (65 FE) MG tablet, Take 325 mg by mouth 2 (two) times daily with a meal., Disp: , Rfl:  .  furosemide (LASIX) 40 MG tablet, Take 40 mg by mouth daily., Disp: , Rfl:  .  HYDROcodone-acetaminophen (NORCO/VICODIN) 5-325 MG per tablet, Take 1 tablet by mouth every 4 (four) hours as needed. , Disp: , Rfl: 0 .  losartan (COZAAR) 100 MG tablet, Take 100 mg by mouth daily., Disp: , Rfl:  .  Meclizine HCl 25 MG CHEW, Chew 1 tablet by mouth as needed. , Disp: , Rfl: 3 .  medroxyPROGESTERone (PROVERA) 2.5 MG tablet, TAKE 1 TABLET (2.5 MG  TOTAL) BY MOUTH DAILY., Disp: 30 tablet, Rfl: 11 .  verapamil (CALAN-SR) 240 MG CR tablet, Take 1 tablet (240 mg total) by mouth 2 (two) times daily., Disp: 180 tablet, Rfl: 3 .  Vitamin D, Ergocalciferol, (DRISDOL) 50000 units CAPS capsule, Take 1 capsule by mouth once a week., Disp: , Rfl: 12 .  megestrol (  MEGACE) 20 MG tablet, Take 1 tablet (20 mg total) by mouth daily., Disp: 30 tablet, Rfl: 11  Objective Blood pressure 132/74, pulse 95, height 5\' 6"  (1.676 m), weight (!) 335 lb (152 kg).  Gen WDWN NAD   Pertinent ROS No burning with urination, frequency or urgency No nausea, vomiting or diarrhea Nor fever chills or other constitutional symptoms   Labs or studies     Impression Diagnoses this Encounter::   ICD-10-CM   1. Postmenopausal bleeding N95.0     Established relevant diagnosis(es): Morbid obesity  Plan/Recommendations: Meds ordered this encounter  Medications  . megestrol (MEGACE) 20 MG tablet    Sig: Take 1 tablet (20 mg total) by mouth daily.    Dispense:  30 tablet    Refill:  11    Labs or Scans Ordered: No orders of the defined types were placed in this encounter.   Management:: Stop provera 2.5 mg daily Begin megestrol 20 mg daily to suppress the endometrium, pt has post menopasual endometrial stimulation from estrone production of peripheral fat Discussed options with patient and of course want to avpid surgery if at all possible  Follow up Return in about 3 months (around 04/10/2018) for Follow up, with Dr Elonda Husky.        Face to face time:  15 minutes  Greater than 50% of the visit time was spent in counseling and coordination of care with the patient.  The summary and outline of the counseling and care coordination is summarized in the note above.   All questions were answered.

## 2018-01-19 ENCOUNTER — Other Ambulatory Visit (HOSPITAL_COMMUNITY): Payer: Self-pay | Admitting: Family Medicine

## 2018-01-19 DIAGNOSIS — Z1231 Encounter for screening mammogram for malignant neoplasm of breast: Secondary | ICD-10-CM

## 2018-02-03 ENCOUNTER — Ambulatory Visit (HOSPITAL_COMMUNITY): Payer: Medicare HMO

## 2018-02-08 DIAGNOSIS — M1991 Primary osteoarthritis, unspecified site: Secondary | ICD-10-CM | POA: Diagnosis not present

## 2018-02-08 DIAGNOSIS — M25552 Pain in left hip: Secondary | ICD-10-CM | POA: Diagnosis not present

## 2018-02-08 DIAGNOSIS — Z6841 Body Mass Index (BMI) 40.0 and over, adult: Secondary | ICD-10-CM | POA: Diagnosis not present

## 2018-02-08 DIAGNOSIS — Z1389 Encounter for screening for other disorder: Secondary | ICD-10-CM | POA: Diagnosis not present

## 2018-02-15 ENCOUNTER — Encounter: Payer: Self-pay | Admitting: Gastroenterology

## 2018-02-22 ENCOUNTER — Other Ambulatory Visit: Payer: Self-pay | Admitting: Obstetrics & Gynecology

## 2018-04-11 ENCOUNTER — Ambulatory Visit: Payer: Medicare Other | Admitting: Obstetrics & Gynecology

## 2018-04-13 ENCOUNTER — Other Ambulatory Visit: Payer: Self-pay | Admitting: Cardiology

## 2018-05-04 DIAGNOSIS — M1991 Primary osteoarthritis, unspecified site: Secondary | ICD-10-CM | POA: Diagnosis not present

## 2018-05-04 DIAGNOSIS — G894 Chronic pain syndrome: Secondary | ICD-10-CM | POA: Diagnosis not present

## 2018-05-04 DIAGNOSIS — H6123 Impacted cerumen, bilateral: Secondary | ICD-10-CM | POA: Diagnosis not present

## 2018-05-04 DIAGNOSIS — Z6841 Body Mass Index (BMI) 40.0 and over, adult: Secondary | ICD-10-CM | POA: Diagnosis not present

## 2018-05-04 DIAGNOSIS — R42 Dizziness and giddiness: Secondary | ICD-10-CM | POA: Diagnosis not present

## 2018-05-29 ENCOUNTER — Other Ambulatory Visit: Payer: Self-pay | Admitting: Cardiology

## 2018-05-31 ENCOUNTER — Telehealth: Payer: Self-pay | Admitting: Cardiology

## 2018-05-31 NOTE — Telephone Encounter (Signed)
Patient has questions regarding medications. / tg

## 2018-05-31 NOTE — Telephone Encounter (Signed)
Pt needs apt,   7/3 at 1140 am [place] with Dr Harl Bowie

## 2018-05-31 NOTE — Telephone Encounter (Signed)
Attempt to reach, got voice mail, [id number]

## 2018-06-07 ENCOUNTER — Ambulatory Visit: Payer: Medicare HMO | Admitting: Obstetrics & Gynecology

## 2018-06-07 ENCOUNTER — Encounter: Payer: Self-pay | Admitting: Obstetrics & Gynecology

## 2018-06-07 VITALS — BP 137/68 | HR 89 | Ht 66.0 in | Wt 334.4 lb

## 2018-06-07 DIAGNOSIS — K5904 Chronic idiopathic constipation: Secondary | ICD-10-CM | POA: Diagnosis not present

## 2018-06-07 DIAGNOSIS — R102 Pelvic and perineal pain: Secondary | ICD-10-CM

## 2018-06-07 DIAGNOSIS — N898 Other specified noninflammatory disorders of vagina: Secondary | ICD-10-CM

## 2018-06-07 MED ORDER — POLYETHYLENE GLYCOL 3350 17 GM/SCOOP PO POWD
ORAL | 11 refills | Status: DC
Start: 2018-06-07 — End: 2023-01-18

## 2018-06-07 NOTE — Progress Notes (Signed)
       Chief Complaint  Patient presents with  . Follow-up    browish discharge    Blood pressure 137/68, pulse 89, height 5\' 6"  (1.676 m), weight (!) 334 lb 6.4 oz (151.7 kg).  62 y.o. G3P3 No LMP recorded. Patient is postmenopausal. The current method of family planning is post menopausal status.  Subjective Pt with lower pelvic pain Mom passed suddenly 3 weeks ago, she has long history of constipation Vaginal discharge for 74months Itching no Irritation no Odor no Similar to previous yes  Previous treatment variable  Objective Vulva:  normal appearing vulva with no masses, tenderness or lesions Vagina:  normal mucosa, no discharge Cervix:  no cervical motion tenderness and no lesions Uterus:  normal size, contour, position, consistency, mobility, non-tender Adnexa: ovaries:present,  normal adnexa in size, nontender and no masses     Pertinent ROS No burning with urination, frequency or urgency No nausea, vomiting or diarrhea Nor fever chills or other constitutional symptoms   Labs or studies Wet Prep:   A sample of vaginal discharge was obtained from the posterior fornix using a cotton swab. 2 drops of saline were placed on a slide and the cotton swab was immersed in the saline. Microscopic evaluation was performed and results were as follows:  Negative  for yeast  Negative for clue cells , consistent with Bacterial vaginosis Negative for trichomonas  Normal WBC population   Whiff test: Negative     Impression Diagnoses this Encounter::   ICD-10-CM   1. Vaginal discharge N89.8   2. Chronic idiopathic constipation K59.04     Established relevant diagnosis(es):   Plan/Recommendations: Meds ordered this encounter  Medications  . polyethylene glycol powder (GLYCOLAX/MIRALAX) powder    Sig: 1 scoop daily or as needed    Dispense:  255 g    Refill:  11    Labs or Scans Ordered: No orders of the defined types were placed in this  encounter.   Management:: Continue to use estrace cream + megestrol miralax for chronic constipation issues  Follow up Return if symptoms worsen or fail to improve.       All questions were answered.

## 2018-06-10 ENCOUNTER — Ambulatory Visit (HOSPITAL_COMMUNITY)
Admission: RE | Admit: 2018-06-10 | Discharge: 2018-06-10 | Disposition: A | Discharge status: Home / Self Care | Payer: Medicare HMO | Source: Ambulatory Visit | Attending: Family Medicine | Admitting: Family Medicine

## 2018-06-10 DIAGNOSIS — Z1231 Encounter for screening mammogram for malignant neoplasm of breast: Secondary | ICD-10-CM | POA: Insufficient documentation

## 2018-06-15 DIAGNOSIS — Z1389 Encounter for screening for other disorder: Secondary | ICD-10-CM | POA: Diagnosis not present

## 2018-06-15 DIAGNOSIS — L600 Ingrowing nail: Secondary | ICD-10-CM | POA: Diagnosis not present

## 2018-06-15 DIAGNOSIS — Z6836 Body mass index (BMI) 36.0-36.9, adult: Secondary | ICD-10-CM | POA: Diagnosis not present

## 2018-06-15 DIAGNOSIS — E6609 Other obesity due to excess calories: Secondary | ICD-10-CM | POA: Diagnosis not present

## 2018-06-22 ENCOUNTER — Encounter: Payer: Self-pay | Admitting: Cardiology

## 2018-06-22 ENCOUNTER — Other Ambulatory Visit: Payer: Self-pay

## 2018-06-22 ENCOUNTER — Ambulatory Visit: Payer: Medicare HMO | Admitting: Cardiology

## 2018-06-22 VITALS — BP 154/75 | HR 88 | Ht 66.0 in | Wt 333.0 lb

## 2018-06-22 DIAGNOSIS — R0789 Other chest pain: Secondary | ICD-10-CM

## 2018-06-22 DIAGNOSIS — I10 Essential (primary) hypertension: Secondary | ICD-10-CM | POA: Diagnosis not present

## 2018-06-22 DIAGNOSIS — R002 Palpitations: Secondary | ICD-10-CM

## 2018-06-22 MED ORDER — CARVEDILOL 3.125 MG PO TABS: ORAL_TABLET | ORAL | 1 refills | Status: DC

## 2018-06-22 NOTE — Progress Notes (Signed)
Clinical Summary Megan Oneal is a 62 y.o.female seen today for follow up of the following medical problems.   1. Chest pain - 2016 nuclear stress no ischemia - echo 2016 LVEF >54%, normal diastolic function  - denies any recent symptoms.    2. Palpitations - most recent monitor with PVCs, no arrhythmias - previous notes mention a history of PSVT, details are unclear.   - can have some palpitations at times, comes and goes. Often brought on with stress, or caffeine.   3. OSA screen - she reports sleep study  in 2010   4. Carotid stenosis - carotid 2014 mild bilaterla disease - denies any neuro symptoms  Past Medical History:  Diagnosis Date  . Anxiety 03/12/2013  . Degenerative disc disease, lumbar    Epidural injections in 2007  . Diabetes mellitus without complication ([place])   . Endometriosis   . Frequent PVCs    12,000/24 hours  . Hypertension   . Otalgia, unspecified   . Palpitations 10/04/2010  . Vascular bruit 01/2013   Right neck     Allergies  Allergen Reactions  . Metoprolol Other (See Comments)    Fatigue and malaise; wheezing  . Sulfa Antibiotics      Current Outpatient Medications  Medication Sig Dispense Refill  . ALPRAZolam (XANAX) 0.5 MG tablet Take 0.5 mg by mouth 3 (three) times daily as needed for anxiety or sleep.     Marland Kitchen aspirin 81 MG tablet Take 81 mg by mouth daily.    . benzonatate (TESSALON) 100 MG capsule Take 100 mg by mouth 3 (three) times daily as needed for cough.    . conjugated estrogens (PREMARIN) vaginal cream Place 1 Applicatorful vaginally daily. 42.5 g 12  . dexlansoprazole (DEXILANT) 60 MG capsule Take 60 mg by mouth daily.    . ferrous sulfate 325 (65 FE) MG tablet Take 325 mg by mouth 2 (two) times daily with a meal.    . furosemide (LASIX) 40 MG tablet Take 40 mg by mouth daily.    Marland Kitchen HYDROcodone-acetaminophen (NORCO/VICODIN) 5-325 MG per tablet Take 1 tablet by mouth every 4 (four) hours as needed.   0  . losartan  (COZAAR) 100 MG tablet Take 100 mg by mouth daily.    . Meclizine HCl 25 MG CHEW Chew 1 tablet by mouth as needed.   3  . medroxyPROGESTERone (PROVERA) 2.5 MG tablet TAKE 1 TABLET (2.5 MG TOTAL) BY MOUTH DAILY. (Patient taking differently: No sig reported) 30 tablet 11  . megestrol (MEGACE) 20 MG tablet Take 1 tablet (20 mg total) by mouth daily. 30 tablet 11  . polyethylene glycol powder (GLYCOLAX/MIRALAX) powder 1 scoop daily or as needed 255 g 11  . verapamil (VERELAN PM) 240 MG 24 hr capsule Take 1 capsule (240 mg total) by mouth 2 (two) times daily. 10 capsule 0  . Vitamin D, Ergocalciferol, (DRISDOL) 50000 units CAPS capsule Take 1 capsule by mouth once a week.  12   No current facility-administered medications for this visit.      Past Surgical History:  Procedure Laterality Date  . BREAST SURGERY     benign tumor excised-age 66  . CARDIOVASCULAR STRESS TEST  03/13/2008   No scintigraphic evidence of inducible myocardial ischemia, EKG negative for ischemia, no ECG changes.  . COLONOSCOPY  2009  . CYST EXCISION  2007   Scalp  . DOPPLER ECHOCARDIOGRAPHY  03/13/2008   EF >56%, LV systolic function is normal, LV wall function  is normal  . LAPAROSCOPIC CHOLECYSTECTOMY  2010  . LUMBAR LAMINECTOMY  2009    L4-L5 and L5-S1 laminectomy with diskectomy; transforaminal  . REPLACEMENT TOTAL KNEE    . SLEEP STUDY  10/29/2009   AHI-1.8/hr, during REM 6.4/hr; RDI-8.0/hr, during REM 15.0/hr; avg oxygen during REM and NREM was 94%  . TOTAL KNEE ARTHROPLASTY Bilateral   . TUBAL LIGATION       Allergies  Allergen Reactions  . Metoprolol Other (See Comments)    Fatigue and malaise; wheezing  . Sulfa Antibiotics       Family History  Problem Relation Age of Onset  . Diabetes Mother   . Hypertension Mother   . Diabetes Brother   . Hypertension Brother   . Hypertension Father      Social History Megan Oneal reports that she quit smoking about 21 years ago. Her smoking use included  cigarettes. She started smoking about 45 years ago. She has a 40.00 pack-year smoking history. She quit smokeless tobacco use about 23 years ago. Megan Oneal reports that she does not drink alcohol.   Review of Systems CONSTITUTIONAL: No weight loss, fever, chills, weakness or fatigue.  HEENT: Eyes: No visual loss, blurred vision, double vision or yellow sclerae.No hearing loss, sneezing, congestion, runny nose or sore throat.  SKIN: No rash or itching.  CARDIOVASCULAR: per hpi RESPIRATORY: No shortness of breath, cough or sputum.  GASTROINTESTINAL: No anorexia, nausea, vomiting or diarrhea. No abdominal pain or blood.  GENITOURINARY: No burning on urination, no polyuria NEUROLOGICAL: No headache, dizziness, syncope, paralysis, ataxia, numbness or tingling in the extremities. No change in bowel or bladder control.  MUSCULOSKELETAL: No muscle, back pain, joint pain or stiffness.  LYMPHATICS: No enlarged nodes. No history of splenectomy.  PSYCHIATRIC: No history of depression or anxiety.  ENDOCRINOLOGIC: No reports of sweating, cold or heat intolerance. No polyuria or polydipsia.  Marland Kitchen   Physical Examination Vitals:   06/22/18 1115  BP: (!) 154/75  Pulse: 88  SpO2: 98%   Vitals:   06/22/18 1115  Weight: (!) 333 lb (151 kg)  Height: 5\' 6"  (1.676 m)    Gen: resting comfortably, no acute distress HEENT: no scleral icterus, pupils equal round and reactive, no palptable cervical adenopathy,  CV: RRR, no m/r/g, no jvd Resp: Clear to auscultation bilaterally GI: abdomen is soft, non-tender, non-distended, normal bowel sounds, no hepatosplenomegaly MSK: extremities are warm, no edema.  Skin: warm, no rash Neuro:  no focal deficits Psych: appropriate affect     Assessment and Plan   1. Chest pain - previous negative stress test - denies any recent symptoms, continue to monitor.   2. Palpitations - still with symptoms at times. She reports some side effects to lopressor  prevoiusly. We will try prn coreg for palpitations.  - EKG today shows normal sinus rhythm  3. HTN - elevated in clinic, repeat manual 130/80. Continue current meds   F/u1 year     Arnoldo Lenis, M.D.

## 2018-06-22 NOTE — Patient Instructions (Signed)
Your physician wants you to follow-up in: [place] will receive a reminder letter in the mail two months in advance. If you don't receive a letter, please call our office to schedule the follow-up appointment.  Your physician has recommended you make the following change in your medication:   START COREG 3.125 MG TWICE DAILY AS NEEDED FOR PALPITATIONS   Thank you for choosing [facility]!!

## 2018-06-23 ENCOUNTER — Other Ambulatory Visit: Payer: Self-pay | Admitting: Cardiology

## 2018-06-30 ENCOUNTER — Encounter: Payer: Self-pay | Admitting: Cardiology

## 2018-07-04 DIAGNOSIS — G894 Chronic pain syndrome: Secondary | ICD-10-CM | POA: Diagnosis not present

## 2018-07-04 DIAGNOSIS — D509 Iron deficiency anemia, unspecified: Secondary | ICD-10-CM | POA: Diagnosis not present

## 2018-07-04 DIAGNOSIS — R7309 Other abnormal glucose: Secondary | ICD-10-CM | POA: Diagnosis not present

## 2018-07-04 DIAGNOSIS — I10 Essential (primary) hypertension: Secondary | ICD-10-CM | POA: Diagnosis not present

## 2018-07-04 DIAGNOSIS — Z6841 Body Mass Index (BMI) 40.0 and over, adult: Secondary | ICD-10-CM | POA: Diagnosis not present

## 2018-07-04 DIAGNOSIS — E559 Vitamin D deficiency, unspecified: Secondary | ICD-10-CM | POA: Diagnosis not present

## 2018-08-31 DIAGNOSIS — R69 Illness, unspecified: Secondary | ICD-10-CM | POA: Diagnosis not present

## 2018-09-26 DIAGNOSIS — Z6836 Body mass index (BMI) 36.0-36.9, adult: Secondary | ICD-10-CM | POA: Diagnosis not present

## 2018-09-26 DIAGNOSIS — E6609 Other obesity due to excess calories: Secondary | ICD-10-CM | POA: Diagnosis not present

## 2018-09-26 DIAGNOSIS — S43422A Sprain of left rotator cuff capsule, initial encounter: Secondary | ICD-10-CM | POA: Diagnosis not present

## 2018-09-26 DIAGNOSIS — Z1389 Encounter for screening for other disorder: Secondary | ICD-10-CM | POA: Diagnosis not present

## 2018-09-26 DIAGNOSIS — M25512 Pain in left shoulder: Secondary | ICD-10-CM | POA: Diagnosis not present

## 2018-10-04 DIAGNOSIS — L600 Ingrowing nail: Secondary | ICD-10-CM | POA: Diagnosis not present

## 2018-10-04 DIAGNOSIS — M79675 Pain in left toe(s): Secondary | ICD-10-CM | POA: Diagnosis not present

## 2018-10-04 DIAGNOSIS — L03032 Cellulitis of left toe: Secondary | ICD-10-CM | POA: Diagnosis not present

## 2018-10-04 DIAGNOSIS — M79672 Pain in left foot: Secondary | ICD-10-CM | POA: Diagnosis not present

## 2018-10-25 DIAGNOSIS — L03032 Cellulitis of left toe: Secondary | ICD-10-CM | POA: Diagnosis not present

## 2018-10-25 DIAGNOSIS — M79672 Pain in left foot: Secondary | ICD-10-CM | POA: Diagnosis not present

## 2018-10-25 DIAGNOSIS — L600 Ingrowing nail: Secondary | ICD-10-CM | POA: Diagnosis not present

## 2018-10-25 DIAGNOSIS — M79675 Pain in left toe(s): Secondary | ICD-10-CM | POA: Diagnosis not present

## 2018-12-01 ENCOUNTER — Other Ambulatory Visit: Payer: Self-pay | Admitting: Cardiology

## 2018-12-02 ENCOUNTER — Other Ambulatory Visit: Payer: Self-pay | Admitting: Cardiology

## 2018-12-02 ENCOUNTER — Other Ambulatory Visit: Payer: Self-pay | Admitting: Obstetrics & Gynecology

## 2018-12-07 DIAGNOSIS — G894 Chronic pain syndrome: Secondary | ICD-10-CM | POA: Diagnosis not present

## 2018-12-07 DIAGNOSIS — Z6841 Body Mass Index (BMI) 40.0 and over, adult: Secondary | ICD-10-CM | POA: Diagnosis not present

## 2018-12-19 ENCOUNTER — Other Ambulatory Visit: Payer: Self-pay | Admitting: Obstetrics & Gynecology

## 2018-12-29 ENCOUNTER — Other Ambulatory Visit: Payer: Self-pay | Admitting: Obstetrics & Gynecology

## 2019-01-09 DIAGNOSIS — Z0001 Encounter for general adult medical examination with abnormal findings: Secondary | ICD-10-CM | POA: Diagnosis not present

## 2019-01-09 DIAGNOSIS — Z1389 Encounter for screening for other disorder: Secondary | ICD-10-CM | POA: Diagnosis not present

## 2019-01-09 DIAGNOSIS — E559 Vitamin D deficiency, unspecified: Secondary | ICD-10-CM | POA: Diagnosis not present

## 2019-01-09 DIAGNOSIS — Z6841 Body Mass Index (BMI) 40.0 and over, adult: Secondary | ICD-10-CM | POA: Diagnosis not present

## 2019-01-09 DIAGNOSIS — I10 Essential (primary) hypertension: Secondary | ICD-10-CM | POA: Diagnosis not present

## 2019-01-09 DIAGNOSIS — D509 Iron deficiency anemia, unspecified: Secondary | ICD-10-CM | POA: Diagnosis not present

## 2019-01-09 DIAGNOSIS — E7849 Other hyperlipidemia: Secondary | ICD-10-CM | POA: Diagnosis not present

## 2019-01-09 DIAGNOSIS — R7309 Other abnormal glucose: Secondary | ICD-10-CM | POA: Diagnosis not present

## 2019-01-09 DIAGNOSIS — E782 Mixed hyperlipidemia: Secondary | ICD-10-CM | POA: Diagnosis not present

## 2019-01-10 ENCOUNTER — Encounter (INDEPENDENT_AMBULATORY_CARE_PROVIDER_SITE_OTHER): Payer: Self-pay | Admitting: *Deleted

## 2019-01-13 DIAGNOSIS — J029 Acute pharyngitis, unspecified: Secondary | ICD-10-CM | POA: Diagnosis not present

## 2019-01-13 DIAGNOSIS — J069 Acute upper respiratory infection, unspecified: Secondary | ICD-10-CM | POA: Diagnosis not present

## 2019-01-13 DIAGNOSIS — Z6841 Body Mass Index (BMI) 40.0 and over, adult: Secondary | ICD-10-CM | POA: Diagnosis not present

## 2019-02-15 ENCOUNTER — Ambulatory Visit (INDEPENDENT_AMBULATORY_CARE_PROVIDER_SITE_OTHER): Payer: Medicare HMO | Admitting: Internal Medicine

## 2019-02-15 ENCOUNTER — Encounter (INDEPENDENT_AMBULATORY_CARE_PROVIDER_SITE_OTHER): Payer: Self-pay | Admitting: *Deleted

## 2019-02-15 ENCOUNTER — Telehealth (INDEPENDENT_AMBULATORY_CARE_PROVIDER_SITE_OTHER): Payer: Self-pay | Admitting: *Deleted

## 2019-02-15 ENCOUNTER — Encounter (INDEPENDENT_AMBULATORY_CARE_PROVIDER_SITE_OTHER): Payer: Self-pay | Admitting: Internal Medicine

## 2019-02-15 VITALS — BP 140/79 | HR 94 | Temp 98.2°F | Ht 66.0 in | Wt 327.5 lb

## 2019-02-15 DIAGNOSIS — Z1211 Encounter for screening for malignant neoplasm of colon: Secondary | ICD-10-CM | POA: Diagnosis not present

## 2019-02-15 DIAGNOSIS — K5900 Constipation, unspecified: Secondary | ICD-10-CM

## 2019-02-15 MED ORDER — SUPREP BOWEL PREP KIT 17.5-3.13-1.6 GM/177ML PO SOLN: 1.0000 | Freq: Once | ORAL | 0 refills | Status: AC

## 2019-02-15 NOTE — Progress Notes (Signed)
Subjective:    Patient ID: Megan Oneal, female    DOB: [birth year], 62 y.o.   MRN: [id number]  HPI Referred by Dr. Hilma Favors for constipation/colonoscopy. Last colonoscopy Dr. Oneida Alar which revealed small internal hemorrhoids. Normal. She is overdue for a colonoscopy. She tells me she has hx of constipation and hemorrhoids. She takes Miralax for the constipation. She has a BM daily with the Miralax which really helps. States she has a small hemorrhoids since lifting 32 bottles of water. Her appetite is okay. No weight loss.    Last colonoscopy in 2009.  Review of Systems Past Medical History:  Diagnosis Date  . Anxiety 03/12/2013  . Degenerative disc disease, lumbar    Epidural injections in 2007  . Diabetes mellitus without complication ([place])   . Endometriosis   . Frequent PVCs    12,000/24 hours  . Hypertension   . Otalgia, unspecified   . Palpitations 10/04/2010  . Vascular bruit 01/2013   Right neck    Past Surgical History:  Procedure Laterality Date  . BREAST SURGERY     benign tumor excised-age 60  . CARDIOVASCULAR STRESS TEST  03/13/2008   No scintigraphic evidence of inducible myocardial ischemia, EKG negative for ischemia, no ECG changes.  . COLONOSCOPY  2009  . CYST EXCISION  2007   Scalp  . DOPPLER ECHOCARDIOGRAPHY  03/13/2008   EF >42%, LV systolic function is normal, LV wall function is normal  . LAPAROSCOPIC CHOLECYSTECTOMY  2010  . LUMBAR LAMINECTOMY  2009    L4-L5 and L5-S1 laminectomy with diskectomy; transforaminal  . REPLACEMENT TOTAL KNEE    . SLEEP STUDY  10/29/2009   AHI-1.8/hr, during REM 6.4/hr; RDI-8.0/hr, during REM 15.0/hr; avg oxygen during REM and NREM was 94%  . TOTAL KNEE ARTHROPLASTY Bilateral   . TUBAL LIGATION      Allergies  Allergen Reactions  . Metoprolol Other (See Comments)    Fatigue and malaise; wheezing  . Sulfa Antibiotics     Current Outpatient Medications on File Prior to Visit  Medication Sig Dispense Refill  . aspirin  81 MG tablet Take 81 mg by mouth daily.    Marland Kitchen dexlansoprazole (DEXILANT) 60 MG capsule Take 60 mg by mouth daily.    Marland Kitchen escitalopram (LEXAPRO) 10 MG tablet Take 10 mg by mouth daily.    . ferrous sulfate 325 (65 FE) MG tablet Take 325 mg by mouth 2 (two) times daily with a meal.    . furosemide (LASIX) 40 MG tablet Take 40 mg by mouth daily.    Marland Kitchen HYDROcodone-acetaminophen (NORCO/VICODIN) 5-325 MG per tablet Take 1 tablet by mouth every 4 (four) hours as needed.   0  . losartan (COZAAR) 100 MG tablet Take 100 mg by mouth daily.    . Meclizine HCl 25 MG CHEW Chew 1 tablet by mouth as needed.   3  . megestrol (MEGACE) 20 MG tablet TAKE 1 TABLET BY MOUTH EVERY DAY 90 tablet 3  . polyethylene glycol powder (GLYCOLAX/MIRALAX) powder 1 scoop daily or as needed 255 g 11  . verapamil (VERELAN PM) 240 MG 24 hr capsule TAKE 1 CAPSULE BY MOUTH TWICE A DAY 180 capsule 2   No current facility-administered medications on file prior to visit.         Objective:   Physical Exam Blood pressure 140/79, pulse 94, temperature 98.2 F (36.8 C), height 5\' 6"  (1.676 m), weight (!) 327 lb 8 oz (148.6 kg). Alert and oriented. Skin warm and dry.  Oral mucosa is moist.   . Sclera anicteric, conjunctivae is pink. Thyroid not enlarged. No cervical lymphadenopathy. Lungs clear. Heart regular rate and rhythm.  Abdomen is soft. Bowel sounds are positive. No hepatomegaly. No abdominal masses felt. No tenderness.  No edema to lower extremities.          Assessment & Plan:  Constipation. Continue the Miralax. Screening colonoscopy.  The risks of bleeding, perforation and infection were reviewed with patient.

## 2019-02-15 NOTE — Telephone Encounter (Signed)
Patient needs suprep 

## 2019-02-15 NOTE — Patient Instructions (Signed)
Continue the MIralax. The risks of bleeding, perforation and infection were reviewed with patient.

## 2019-03-15 DIAGNOSIS — G894 Chronic pain syndrome: Secondary | ICD-10-CM | POA: Diagnosis not present

## 2019-03-15 DIAGNOSIS — R69 Illness, unspecified: Secondary | ICD-10-CM | POA: Diagnosis not present

## 2019-03-15 DIAGNOSIS — J302 Other seasonal allergic rhinitis: Secondary | ICD-10-CM | POA: Diagnosis not present

## 2019-06-05 DIAGNOSIS — G894 Chronic pain syndrome: Secondary | ICD-10-CM | POA: Diagnosis not present

## 2019-06-26 ENCOUNTER — Encounter (INDEPENDENT_AMBULATORY_CARE_PROVIDER_SITE_OTHER): Payer: Self-pay | Admitting: *Deleted

## 2019-06-28 DIAGNOSIS — J302 Other seasonal allergic rhinitis: Secondary | ICD-10-CM | POA: Diagnosis not present

## 2019-06-28 DIAGNOSIS — J0100 Acute maxillary sinusitis, unspecified: Secondary | ICD-10-CM | POA: Diagnosis not present

## 2019-06-28 DIAGNOSIS — H6122 Impacted cerumen, left ear: Secondary | ICD-10-CM | POA: Diagnosis not present

## 2019-06-28 DIAGNOSIS — Z6841 Body Mass Index (BMI) 40.0 and over, adult: Secondary | ICD-10-CM | POA: Diagnosis not present

## 2019-07-17 ENCOUNTER — Other Ambulatory Visit (HOSPITAL_COMMUNITY): Payer: Self-pay | Admitting: Family Medicine

## 2019-07-17 DIAGNOSIS — Z1231 Encounter for screening mammogram for malignant neoplasm of breast: Secondary | ICD-10-CM

## 2019-07-24 DIAGNOSIS — Z1389 Encounter for screening for other disorder: Secondary | ICD-10-CM | POA: Diagnosis not present

## 2019-07-24 DIAGNOSIS — E7849 Other hyperlipidemia: Secondary | ICD-10-CM | POA: Diagnosis not present

## 2019-07-24 DIAGNOSIS — J302 Other seasonal allergic rhinitis: Secondary | ICD-10-CM | POA: Diagnosis not present

## 2019-07-24 DIAGNOSIS — I10 Essential (primary) hypertension: Secondary | ICD-10-CM | POA: Diagnosis not present

## 2019-07-24 DIAGNOSIS — Z6841 Body Mass Index (BMI) 40.0 and over, adult: Secondary | ICD-10-CM | POA: Diagnosis not present

## 2019-07-24 DIAGNOSIS — R7309 Other abnormal glucose: Secondary | ICD-10-CM | POA: Diagnosis not present

## 2019-07-24 DIAGNOSIS — E119 Type 2 diabetes mellitus without complications: Secondary | ICD-10-CM | POA: Diagnosis not present

## 2019-07-27 ENCOUNTER — Ambulatory Visit (HOSPITAL_COMMUNITY)
Admission: RE | Admit: 2019-07-27 | Discharge: 2019-07-27 | Disposition: A | Discharge status: Home / Self Care | Payer: Medicare HMO | Source: Ambulatory Visit | Attending: Family Medicine | Admitting: Family Medicine

## 2019-07-27 ENCOUNTER — Other Ambulatory Visit: Payer: Self-pay

## 2019-07-27 DIAGNOSIS — Z1211 Encounter for screening for malignant neoplasm of colon: Principal | ICD-10-CM

## 2019-07-27 DIAGNOSIS — Z1231 Encounter for screening mammogram for malignant neoplasm of breast: Secondary | ICD-10-CM | POA: Insufficient documentation

## 2019-07-28 ENCOUNTER — Ambulatory Visit: Payer: Medicare HMO | Admitting: Cardiology

## 2019-07-28 ENCOUNTER — Encounter: Payer: Self-pay | Admitting: *Deleted

## 2019-07-28 ENCOUNTER — Encounter: Payer: Self-pay | Admitting: Cardiology

## 2019-07-28 VITALS — BP 145/79 | HR 78 | Ht 66.0 in | Wt 323.4 lb

## 2019-07-28 DIAGNOSIS — R002 Palpitations: Secondary | ICD-10-CM

## 2019-07-28 DIAGNOSIS — I10 Essential (primary) hypertension: Secondary | ICD-10-CM

## 2019-07-28 DIAGNOSIS — R0789 Other chest pain: Secondary | ICD-10-CM | POA: Diagnosis not present

## 2019-07-28 MED ORDER — DILTIAZEM HCL 30 MG PO TABS: 30.0000 mg | ORAL_TABLET | Freq: Two times a day (BID) | ORAL | 6 refills | Status: DC | PRN

## 2019-07-28 NOTE — Patient Instructions (Signed)
Medication Instructions:   Begin Diltiazem 30mg  twice a day as needed for palpitations.    Continue all other medications.    Labwork: none  Testing/Procedures: none  Follow-Up: Your physician wants you to follow up in:  1 year.  You will receive a reminder letter in the mail one-two months in advance.  If you don't receive a letter, please call our office to schedule the follow up appointment   Any Other Special Instructions Will Be Listed Below (If Applicable).  If you need a refill on your cardiac medications before your next appointment, please call your pharmacy.

## 2019-07-28 NOTE — Progress Notes (Signed)
Clinical Summary Megan Oneal is a 63 y.o.female seen today for follow up of the following medical problems.   1. Chest pain - 2016 nuclear stress no ischemia - echo 2016 LVEF >41%, normal diastolic function   - no recent chest pain. No SOB or DOE   2. Palpitations - most recent monitor with PVCs, no arrhythmias - previous notes mention a history of PSVT, details are unclear.  - palpitations come on with stress mainly. Side effects on lopressor, reports prn coreg did not work.     3. OSA screen - she reports sleep study  in 2010   4. Carotid bruit - carotid 2014 mild bilaterla disease - no recent neuro symptoms   5. HTN - has not taken losartan yet today  Past Medical History:  Diagnosis Date  . Anxiety 03/12/2013  . Degenerative disc disease, lumbar    Epidural injections in 2007  . Diabetes mellitus without complication ([place])   . Endometriosis   . Frequent PVCs    12,000/24 hours  . Hypertension   . Otalgia, unspecified   . Palpitations 10/04/2010  . Vascular bruit 01/2013   Right neck     Allergies  Allergen Reactions  . Metoprolol Other (See Comments)    Fatigue and malaise; wheezing  . Sulfa Antibiotics      Current Outpatient Medications  Medication Sig Dispense Refill  . aspirin 81 MG tablet Take 81 mg by mouth daily.    Marland Kitchen dexlansoprazole (DEXILANT) 60 MG capsule Take 60 mg by mouth daily.    Marland Kitchen escitalopram (LEXAPRO) 10 MG tablet Take 10 mg by mouth daily.    . ferrous sulfate 325 (65 FE) MG tablet Take 325 mg by mouth 2 (two) times daily with a meal.    . furosemide (LASIX) 40 MG tablet Take 40 mg by mouth daily.    Marland Kitchen HYDROcodone-acetaminophen (NORCO/VICODIN) 5-325 MG per tablet Take 1 tablet by mouth every 4 (four) hours as needed.   0  . losartan (COZAAR) 100 MG tablet Take 100 mg by mouth daily.    . Meclizine HCl 25 MG CHEW Chew 1 tablet by mouth as needed.   3  . megestrol (MEGACE) 20 MG tablet TAKE 1 TABLET BY MOUTH EVERY  DAY 90 tablet 3  . polyethylene glycol powder (GLYCOLAX/MIRALAX) powder 1 scoop daily or as needed 255 g 11  . verapamil (VERELAN PM) 240 MG 24 hr capsule TAKE 1 CAPSULE BY MOUTH TWICE A DAY 180 capsule 2   No current facility-administered medications for this visit.      Past Surgical History:  Procedure Laterality Date  . BREAST SURGERY     benign tumor excised-age 21  . CARDIOVASCULAR STRESS TEST  03/13/2008   No scintigraphic evidence of inducible myocardial ischemia, EKG negative for ischemia, no ECG changes.  . COLONOSCOPY  2009  . CYST EXCISION  2007   Scalp  . DOPPLER ECHOCARDIOGRAPHY  03/13/2008   EF >66%, LV systolic function is normal, LV wall function is normal  . LAPAROSCOPIC CHOLECYSTECTOMY  2010  . LUMBAR LAMINECTOMY  2009    L4-L5 and L5-S1 laminectomy with diskectomy; transforaminal  . REPLACEMENT TOTAL KNEE    . SLEEP STUDY  10/29/2009   AHI-1.8/hr, during REM 6.4/hr; RDI-8.0/hr, during REM 15.0/hr; avg oxygen during REM and NREM was 94%  . TOTAL KNEE ARTHROPLASTY Bilateral   . TUBAL LIGATION       Allergies  Allergen Reactions  . Metoprolol Other (See Comments)  Fatigue and malaise; wheezing  . Sulfa Antibiotics       Family History  Problem Relation Age of Onset  . Diabetes Mother   . Hypertension Mother   . Diabetes Brother   . Hypertension Brother   . Hypertension Father      Social History [name] reports that she quit smoking about 22 years ago. Her smoking use included cigarettes. She started smoking about 47 years ago. She has a 40.00 pack-year smoking history. She quit smokeless tobacco use about 24 years ago. [name] reports no history of alcohol use.   Review of Systems CONSTITUTIONAL: No weight loss, fever, chills, weakness or fatigue.  HEENT: Eyes: No visual loss, blurred vision, double vision or yellow sclerae.No hearing loss, sneezing, congestion, runny nose or sore throat.  SKIN: No rash or itching.  CARDIOVASCULAR: per hpi  RESPIRATORY: No shortness of breath, cough or sputum.  GASTROINTESTINAL: No anorexia, nausea, vomiting or diarrhea. No abdominal pain or blood.  GENITOURINARY: No burning on urination, no polyuria NEUROLOGICAL: No headache, dizziness, syncope, paralysis, ataxia, numbness or tingling in the extremities. No change in bowel or bladder control.  MUSCULOSKELETAL: No muscle, back pain, joint pain or stiffness.  LYMPHATICS: No enlarged nodes. No history of splenectomy.  PSYCHIATRIC: No history of depression or anxiety.  ENDOCRINOLOGIC: No reports of sweating, cold or heat intolerance. No polyuria or polydipsia.  Marland Kitchen   Physical Examination Today's Vitals   07/28/19 0820  BP: (!) 145/79  Pulse: 78  SpO2: 99%  Weight: (!) 323 lb 6.4 oz (146.7 kg)  Height: 5\' 6"  (1.676 m)   Body mass index is 52.2 kg/m.  Gen: resting comfortably, no acute distress HEENT: no scleral icterus, pupils equal round and reactive, no palptable cervical adenopathy,  CV: RRR, no m/r/g, no jvd Resp: Clear to auscultation bilaterally GI: abdomen is soft, non-tender, non-distended, normal bowel sounds, no hepatosplenomegaly MSK: extremities are warm, no edema.  Skin: warm, no rash Neuro:  no focal deficits Psych: appropriate affect    Assessment and Plan   1. Chest pain - previous negative stress test - no recent symptoms, continue to monitor.   2. Palpitations - She reports some side effects to lopressor prevoiusly.prn coreg did not help - remains on her verpamil fairly high dose. Add on prn short acting diltiazem for palpitations - EKG today shows NSR  3. HTN - elevated in clinic but has not taken losartan yet, continue to monitor.   Request pcp labs  F/u1 year     Arnoldo Lenis, M.D.

## 2019-08-12 DIAGNOSIS — R69 Illness, unspecified: Secondary | ICD-10-CM | POA: Diagnosis not present

## 2019-08-21 DIAGNOSIS — M1991 Primary osteoarthritis, unspecified site: Secondary | ICD-10-CM | POA: Diagnosis not present

## 2019-08-21 DIAGNOSIS — I10 Essential (primary) hypertension: Secondary | ICD-10-CM | POA: Diagnosis not present

## 2019-08-21 DIAGNOSIS — E782 Mixed hyperlipidemia: Secondary | ICD-10-CM | POA: Diagnosis not present

## 2019-08-21 DIAGNOSIS — R7309 Other abnormal glucose: Secondary | ICD-10-CM | POA: Diagnosis not present

## 2019-08-22 ENCOUNTER — Telehealth (INDEPENDENT_AMBULATORY_CARE_PROVIDER_SITE_OTHER): Payer: Self-pay | Admitting: *Deleted

## 2019-08-22 ENCOUNTER — Encounter (INDEPENDENT_AMBULATORY_CARE_PROVIDER_SITE_OTHER): Payer: Self-pay | Admitting: *Deleted

## 2019-08-22 DIAGNOSIS — Z1211 Encounter for screening for malignant neoplasm of colon: Secondary | ICD-10-CM

## 2019-08-22 MED ORDER — PEG 3350-KCL-NA BICARB-NACL 420 G PO SOLR: 4000.0000 mL | Freq: Once | ORAL | 0 refills | Status: AC

## 2019-08-22 NOTE — Telephone Encounter (Signed)
Patient needs trilyte TCS sch'd 10/1

## 2019-09-08 DIAGNOSIS — M25552 Pain in left hip: Secondary | ICD-10-CM | POA: Diagnosis not present

## 2019-09-08 DIAGNOSIS — G894 Chronic pain syndrome: Secondary | ICD-10-CM | POA: Diagnosis not present

## 2019-09-08 DIAGNOSIS — Z6841 Body Mass Index (BMI) 40.0 and over, adult: Secondary | ICD-10-CM | POA: Diagnosis not present

## 2019-09-08 DIAGNOSIS — M1991 Primary osteoarthritis, unspecified site: Secondary | ICD-10-CM | POA: Diagnosis not present

## 2019-09-19 ENCOUNTER — Other Ambulatory Visit (HOSPITAL_COMMUNITY)
Admission: RE | Admit: 2019-09-19 | Discharge: 2019-09-19 | Disposition: A | Discharge status: Home / Self Care | Payer: Medicare HMO | Source: Ambulatory Visit | Attending: Internal Medicine | Admitting: Internal Medicine

## 2019-09-19 DIAGNOSIS — K6289 Other specified diseases of anus and rectum: Secondary | ICD-10-CM | POA: Diagnosis not present

## 2019-09-19 DIAGNOSIS — Z20828 Contact with and (suspected) exposure to other viral communicable diseases: Secondary | ICD-10-CM | POA: Insufficient documentation

## 2019-09-19 DIAGNOSIS — Z01812 Encounter for preprocedural laboratory examination: Secondary | ICD-10-CM | POA: Diagnosis not present

## 2019-09-19 DIAGNOSIS — K644 Residual hemorrhoidal skin tags: Secondary | ICD-10-CM | POA: Diagnosis not present

## 2019-09-19 DIAGNOSIS — D120 Benign neoplasm of cecum: Secondary | ICD-10-CM | POA: Insufficient documentation

## 2019-09-19 LAB — SARS CORONAVIRUS 2 (TAT 6-24 HRS): SARS Coronavirus 2: NEGATIVE

## 2019-09-20 DIAGNOSIS — E782 Mixed hyperlipidemia: Secondary | ICD-10-CM | POA: Diagnosis not present

## 2019-09-20 DIAGNOSIS — D509 Iron deficiency anemia, unspecified: Secondary | ICD-10-CM | POA: Diagnosis not present

## 2019-09-20 DIAGNOSIS — R69 Illness, unspecified: Secondary | ICD-10-CM | POA: Diagnosis not present

## 2019-09-20 DIAGNOSIS — I10 Essential (primary) hypertension: Secondary | ICD-10-CM | POA: Diagnosis not present

## 2019-09-21 ENCOUNTER — Other Ambulatory Visit: Payer: Self-pay

## 2019-09-21 ENCOUNTER — Ambulatory Visit (HOSPITAL_COMMUNITY)
Admission: RE | Admit: 2019-09-21 | Discharge: 2019-09-21 | Disposition: A | Discharge status: Home / Self Care | Payer: Medicare HMO | Attending: Internal Medicine | Admitting: Internal Medicine

## 2019-09-21 ENCOUNTER — Encounter (HOSPITAL_COMMUNITY)
Admission: RE | Disposition: A | Discharge status: Home / Self Care | Payer: Self-pay | Source: Home / Self Care | Attending: Internal Medicine

## 2019-09-21 DIAGNOSIS — K5909 Other constipation: Secondary | ICD-10-CM | POA: Insufficient documentation

## 2019-09-21 DIAGNOSIS — Z888 Allergy status to other drugs, medicaments and biological substances status: Secondary | ICD-10-CM | POA: Insufficient documentation

## 2019-09-21 DIAGNOSIS — Z800 Family history of malignant neoplasm of digestive organs: Secondary | ICD-10-CM | POA: Diagnosis not present

## 2019-09-21 DIAGNOSIS — K6289 Other specified diseases of anus and rectum: Secondary | ICD-10-CM | POA: Insufficient documentation

## 2019-09-21 DIAGNOSIS — Z882 Allergy status to sulfonamides status: Secondary | ICD-10-CM | POA: Diagnosis not present

## 2019-09-21 DIAGNOSIS — E119 Type 2 diabetes mellitus without complications: Secondary | ICD-10-CM | POA: Diagnosis not present

## 2019-09-21 DIAGNOSIS — Z8249 Family history of ischemic heart disease and other diseases of the circulatory system: Secondary | ICD-10-CM | POA: Insufficient documentation

## 2019-09-21 DIAGNOSIS — Z79899 Other long term (current) drug therapy: Secondary | ICD-10-CM | POA: Insufficient documentation

## 2019-09-21 DIAGNOSIS — I10 Essential (primary) hypertension: Secondary | ICD-10-CM | POA: Diagnosis not present

## 2019-09-21 DIAGNOSIS — Z87891 Personal history of nicotine dependence: Secondary | ICD-10-CM | POA: Diagnosis not present

## 2019-09-21 DIAGNOSIS — D120 Benign neoplasm of cecum: Secondary | ICD-10-CM | POA: Insufficient documentation

## 2019-09-21 DIAGNOSIS — Z1211 Encounter for screening for malignant neoplasm of colon: Secondary | ICD-10-CM | POA: Insufficient documentation

## 2019-09-21 DIAGNOSIS — Z7982 Long term (current) use of aspirin: Secondary | ICD-10-CM | POA: Diagnosis not present

## 2019-09-21 DIAGNOSIS — Z96653 Presence of artificial knee joint, bilateral: Secondary | ICD-10-CM | POA: Insufficient documentation

## 2019-09-21 DIAGNOSIS — K644 Residual hemorrhoidal skin tags: Secondary | ICD-10-CM

## 2019-09-21 HISTORY — PX: COLONOSCOPY: SHX5424

## 2019-09-21 HISTORY — PX: POLYPECTOMY: SHX5525

## 2019-09-21 LAB — GLUCOSE, CAPILLARY: Glucose-Capillary: 89 mg/dL (ref 70–99)

## 2019-09-21 SURGERY — COLONOSCOPY
Anesthesia: Moderate Sedation

## 2019-09-21 MED ORDER — MIDAZOLAM HCL 5 MG/5ML IJ SOLN
INTRAMUSCULAR | Status: AC
  Filled 2019-09-21: qty 5

## 2019-09-21 MED ORDER — SODIUM CHLORIDE 0.9 % IV SOLN
INTRAVENOUS | Status: DC
  Administered 2019-09-21: 12:00:00 via INTRAVENOUS

## 2019-09-21 MED ORDER — MEPERIDINE HCL 50 MG/ML IJ SOLN
INTRAMUSCULAR | Status: AC
  Filled 2019-09-21: qty 1

## 2019-09-21 MED ORDER — MIDAZOLAM HCL 5 MG/5ML IJ SOLN
INTRAMUSCULAR | Status: AC
  Filled 2019-09-21: qty 10

## 2019-09-21 MED ORDER — MEPERIDINE HCL 50 MG/ML IJ SOLN
INTRAMUSCULAR | Status: DC | PRN
  Administered 2019-09-21 (×4): 25 mg via INTRAVENOUS

## 2019-09-21 MED ORDER — MIDAZOLAM HCL 5 MG/5ML IJ SOLN
INTRAMUSCULAR | Status: DC | PRN
  Administered 2019-09-21 (×6): 2 mg via INTRAVENOUS

## 2019-09-21 NOTE — Discharge Instructions (Signed)
Resume aspirin on 09/22/2019. Resume other medications as before. High-fiber diet. No driving for 24 hours. Physician will call with biopsy results and further recommendations.   Colonoscopy, Adult, Care After This sheet gives you information about how to care for yourself after your procedure. Your health care provider may also give you more specific instructions. If you have problems or questions, contact your health care provider.  Dr. Laural GoldenLI:[phone] What can I expect after the procedure? After the procedure, it is common to have:  A small amount of blood in your stool for 24 hours after the procedure.  Some gas.  Mild abdominal cramping or bloating. Follow these instructions at home: General instructions  For the first 24 hours after the procedure: ? Do not drive or use machinery. ? Do not sign important documents. ? Do not drink alcohol. ? Do your regular daily activities at a slower pace than normal.  Take over-the-counter or prescription medicines only as told by your health care provider. Relieving cramping and bloating   Try walking around when you have cramps or feel bloated.  Eating and drinking   Drink enough fluid to keep your urine pale yellow.  Resume your normal diet as instructed by your health care provider.  Avoid drinking alcohol for 24 hours as instructed by your health care provider. Contact a health care provider if:  You have blood in your stool 2-3 days after the procedure. Get help right away if:  You have more than a small spotting of blood in your stool.  You pass large blood clots in your stool.  Your abdomen is swollen.  You have nausea or vomiting.  You have a fever.  You have increasing abdominal pain that is not relieved with medicine. Summary  After the procedure, it is common to have a small amount of blood in your stool. You may also have mild abdominal cramping and bloating.  For the first 24 hours after the  procedure, do not drive or use machinery, sign important documents, or drink alcohol.  Contact your health care provider if you have a lot of blood in your stool, nausea or vomiting, a fever, or increased abdominal pain. This information is not intended to replace advice given to you by your health care provider. Make sure you discuss any questions you have with your health care provider. Document Released: 07/21/2004 Document Revised: 09/29/2017 Document Reviewed: 02/18/2016 Elsevier Patient Education  [place].   High-Fiber Diet Fiber, also called dietary fiber, is a type of carbohydrate that is found in fruits, vegetables, whole grains, and beans. A high-fiber diet can have many health benefits. Your health care provider may recommend a high-fiber diet to help:  Prevent constipation. Fiber can make your bowel movements more regular.  Lower your cholesterol.  Relieve the following conditions: ? Swelling of veins in the anus (hemorrhoids). ? Swelling and irritation (inflammation) of specific areas of the digestive tract (uncomplicated diverticulosis). ? A problem of the large intestine (colon) that sometimes causes pain and diarrhea (irritable bowel syndrome, IBS).  Prevent overeating as part of a weight-loss plan.  Prevent heart disease, type 2 diabetes, and certain cancers. What is my plan? The recommended daily fiber intake in grams (g) includes:  38 g for men age 21 or younger.  30 g for men over age 69.  37 g for women age 34 or younger.  21 g for women over age 21. You can get the recommended daily intake of dietary fiber by:  Eating a variety of fruits, vegetables, grains, and beans.  Taking a fiber supplement, if it is not possible to get enough fiber through your diet. What do I need to know about a high-fiber diet?  It is better to get fiber through food sources rather than from fiber supplements. There is not a lot of research about how effective  supplements are.  Always check the fiber content on the nutrition facts label of any prepackaged food. Look for foods that contain 5 g of fiber or more per serving.  Talk with a diet and nutrition specialist (dietitian) if you have questions about specific foods that are recommended or not recommended for your medical condition, especially if those foods are not listed below.  Gradually increase how much fiber you consume. If you increase your intake of dietary fiber too quickly, you may have bloating, cramping, or gas.  Drink plenty of water. Water helps you to digest fiber. What are tips for following this plan?  Eat a wide variety of high-fiber foods.  Make sure that half of the grains that you eat each day are whole grains.  Eat breads and cereals that are made with whole-grain flour instead of refined flour or white flour.  Eat brown rice, bulgur wheat, or millet instead of white rice.  Start the day with a breakfast that is high in fiber, such as a cereal that contains 5 g of fiber or more per serving.  Use beans in place of meat in soups, salads, and pasta dishes.  Eat high-fiber snacks, such as berries, raw vegetables, nuts, and popcorn.  Choose whole fruits and vegetables instead of processed forms like juice or sauce. What foods can I eat?  Fruits Berries. Pears. Apples. Oranges. Avocado. Prunes and raisins. Dried figs. Vegetables Sweet potatoes. Spinach. Kale. Artichokes. Cabbage. Broccoli. Cauliflower. Green peas. Carrots. Squash. Grains Whole-grain breads. Multigrain cereal. Oats and oatmeal. Brown rice. Barley. Bulgur wheat. [place]. Quinoa. Bran muffins. Popcorn. Rye wafer crackers. Meats and other proteins Navy, kidney, and pinto beans. Soybeans. Split peas. Lentils. Nuts and seeds. Dairy Fiber-fortified yogurt. Beverages Fiber-fortified soy milk. Fiber-fortified orange juice. Other foods Fiber bars. The items listed above may not be a complete list of  recommended foods and beverages. Contact a dietitian for more options. What foods are not recommended? Fruits Fruit juice. Cooked, strained fruit. Vegetables Fried potatoes. Canned vegetables. Well-cooked vegetables. Grains White bread. Pasta made with refined flour. White rice. Meats and other proteins Fatty cuts of meat. Fried chicken or fried fish. Dairy Milk. Yogurt. Cream cheese. Sour cream. Fats and oils Butters. Beverages Soft drinks. Other foods Cakes and pastries. The items listed above may not be a complete list of foods and beverages to avoid. Contact a dietitian for more information. Summary  Fiber is a type of carbohydrate. It is found in fruits, vegetables, whole grains, and beans.  There are many health benefits of eating a high-fiber diet, such as preventing constipation, lowering blood cholesterol, helping with weight loss, and reducing your risk of heart disease, diabetes, and certain cancers.  Gradually increase your intake of fiber. Increasing too fast can result in cramping, bloating, and gas. Drink plenty of water while you increase your fiber.  The best sources of fiber include whole fruits and vegetables, whole grains, nuts, seeds, and beans. This information is not intended to replace advice given to you by your health care provider. Make sure you discuss any questions you have with your health care provider. Document Released: 12/07/2005 Document  Revised: 10/11/2017 Document Reviewed: 10/11/2017 Elsevier Patient Education  [place].

## 2019-09-21 NOTE — Op Note (Signed)
[facility] Patient Name: Megan Oneal Procedure Date: 09/21/2019 11:50 AM MRN: [id number] Date of Birth: 15-Jun-1956 Attending MD: Hildred Laser , MD CSN: [id number] Age: 63 Admit Type: Outpatient Procedure:                Colonoscopy Indications:              Screening for colorectal malignant neoplasm Providers:                Hildred Laser, MD, Charlsie Quest. Theda Sers RN, RN, Nelma Rothman, Technician Referring MD:             Halford Chessman, MD Medicines:                Meperidine 100 mg IV, Midazolam 12 mg IV Complications:            No immediate complications. Estimated Blood Loss:     Estimated blood loss was minimal. Procedure:                Pre-Anesthesia Assessment:                           - Prior to the procedure, a History and Physical                            was performed, and patient medications and                            allergies were reviewed. The patient's tolerance of                            previous anesthesia was also reviewed. The risks                            and benefits of the procedure and the sedation                            options and risks were discussed with the patient.                            All questions were answered, and informed consent                            was obtained. Prior Anticoagulants: The patient has                            taken no previous anticoagulant or antiplatelet                            agents except for aspirin. ASA Grade Assessment:                            III - A patient with severe systemic disease. After  reviewing the risks and benefits, the patient was                            deemed in satisfactory condition to undergo the                            procedure.                           After obtaining informed consent, the colonoscope                            was passed under direct vision. Throughout the   procedure, the patient's blood pressure, pulse, and                            oxygen saturations were monitored continuously. The                            [id number] scope was introduced through                            the anus and advanced to the the cecum, identified                            by appendiceal orifice and ileocecal valve. The                            colonoscopy was performed without difficulty. The                            patient tolerated the procedure well. The quality                            of the bowel preparation was adequate to identify                            polyps. The ileocecal valve, appendiceal orifice,                            and rectum were photographed. Scope In: 12:39:10 PM Scope Out: 1:00:53 PM Scope Withdrawal Time: 0 hours 14 minutes 39 seconds  Total Procedure Duration: 0 hours 21 minutes 43 seconds  Findings:      The perianal and digital rectal examinations were normal.      A small polyp was found in the cecum. Biopsies were taken with a cold       forceps for histology.      The exam was otherwise normal throughout the examined colon.      External hemorrhoids were found during retroflexion. The hemorrhoids       were medium-sized.      Anal papilla(e) were hypertrophied. Impression:               - One small polyp in the cecum. Biopsied.                           -  External hemorrhoids.                           - Anal papilla(e) were hypertrophied. Moderate Sedation:      Moderate (conscious) sedation was administered by the endoscopy nurse       and supervised by the endoscopist. The following parameters were       monitored: oxygen saturation, heart rate, blood pressure, CO2       capnography and response to care. Total physician intraservice time was       33 minutes. Recommendation:           - Patient has a contact number available for                            emergencies. The signs and symptoms of  potential                            delayed complications were discussed with the                            patient. Return to normal activities tomorrow.                            Written discharge instructions were provided to the                            patient.                           - High fiber diet today.                           - Continue present medications.                           - No aspirin, ibuprofen, naproxen, or other                            non-steroidal anti-inflammatory drugs for 1 day.                           - Await pathology results.                           - Repeat colonoscopy is recommended. The                            colonoscopy date will be determined after pathology                            results from today's exam become available for                            review. Procedure Code(s):        --- Professional ---  [id number], Colonoscopy, flexible; with biopsy, single                            or multiple                           99153, Moderate sedation; each additional 15                            minutes intraservice time                           G0500, Moderate sedation services provided by the                            same physician or other qualified health care                            professional performing a gastrointestinal                            endoscopic service that sedation supports,                            requiring the presence of an independent trained                            observer to assist in the monitoring of the                            patient's level of consciousness and physiological                            status; initial 15 minutes of intra-service time;                            patient age 78 years or older (additional time may                            be reported with [phone], as appropriate) Diagnosis Code(s):        --- Professional ---                            Z12.11, Encounter for screening for malignant                            neoplasm of colon                           K63.5, Polyp of colon                           K62.89, Other specified diseases of anus and rectum                           K64.4, Residual hemorrhoidal  skin tags CPT copyright 2019 American [employer]. All rights reserved. The codes documented in this report are preliminary and upon coder review may  be revised to meet current compliance requirements. Hildred Laser, MD Hildred Laser, MD 09/21/2019 1:13:38 PM This report has been signed electronically. Number of Addenda: 0

## 2019-09-21 NOTE — H&P (Signed)
Megan Oneal is an 63 y.o. female.   Chief Complaint: Patient is here for colonoscopy. HPI: Patient is 63 year old African-American female who is here for screening colonoscopy.  Last exam was over 12 years ago.  She denies abdominal pain or rectal bleeding.  She has chronic constipation for which he takes MiraLAX.  She is on high-fiber diet.  Family history significant for CRC in first cousin who was in her mid 10s at the time of diagnosis and died within 3 months.  No other family member has had colon carcinoma.  Past Medical History:  Diagnosis Date  . Anxiety 03/12/2013  . Degenerative disc disease, lumbar    Epidural injections in 2007  . Diabetes mellitus without complication ([facility])   . Endometriosis   . Frequent PVCs    12,000/24 hours  . Hypertension   . Otalgia, unspecified   . Palpitations 10/04/2010  . Vascular bruit 01/2013   Right neck    Past Surgical History:  Procedure Laterality Date  . BREAST SURGERY     benign tumor excised-age 13  . CARDIOVASCULAR STRESS TEST  03/13/2008   No scintigraphic evidence of inducible myocardial ischemia, EKG negative for ischemia, no ECG changes.  . COLONOSCOPY  2009  . CYST EXCISION  2007   Scalp  . DOPPLER ECHOCARDIOGRAPHY  03/13/2008   EF 123456, LV systolic function is normal, LV wall function is normal  . LAPAROSCOPIC CHOLECYSTECTOMY  2010  . LUMBAR LAMINECTOMY  2009    L4-L5 and L5-S1 laminectomy with diskectomy; transforaminal  . REPLACEMENT TOTAL KNEE    . SLEEP STUDY  10/29/2009   AHI-1.8/hr, during REM 6.4/hr; RDI-8.0/hr, during REM 15.0/hr; avg oxygen during REM and NREM was 94%  . TOTAL KNEE ARTHROPLASTY Bilateral   . TUBAL LIGATION      Family History  Problem Relation Age of Onset  . Diabetes Mother   . Hypertension Mother   . Diabetes Brother   . Hypertension Brother   . Hypertension Father    Social History:  reports that she quit smoking about 23 years ago. Her smoking use included cigarettes. She started smoking  about 47 years ago. She has a 40.00 pack-year smoking history. She quit smokeless tobacco use about 24 years ago. She reports that she does not drink alcohol or use drugs.  Allergies:  Allergies  Allergen Reactions  . Metoprolol Other (See Comments)    Fatigue and malaise; wheezing  . Sulfa Antibiotics     Medications Prior to Admission  Medication Sig Dispense Refill  . aspirin 81 MG tablet Take 81 mg by mouth daily.    . cholecalciferol (VITAMIN D3) 25 MCG (1000 UT) tablet Take 1,000 Units by mouth daily.    Marland Kitchen dexlansoprazole (DEXILANT) 60 MG capsule Take 60 mg by mouth daily.    Marland Kitchen diltiazem (CARDIZEM) 30 MG tablet Take 1 tablet (30 mg total) by mouth 2 (two) times daily as needed (palpitations). 60 tablet 6  . ferrous sulfate 325 (65 FE) MG tablet Take 325 mg by mouth daily with breakfast.     . furosemide (LASIX) 40 MG tablet Take 40 mg by mouth every other day.     Marland Kitchen HYDROcodone-acetaminophen (NORCO/VICODIN) 5-325 MG per tablet Take 1 tablet by mouth every 4 (four) hours as needed for moderate pain or severe pain.   0  . levocetirizine (XYZAL) 5 MG tablet Take 1 tablet by mouth every evening.     Marland Kitchen losartan (COZAAR) 100 MG tablet Take 100 mg by  mouth daily.    . meclizine (ANTIVERT) 25 MG tablet Take 1 tablet by mouth 2 (two) times daily as needed for dizziness or nausea.   3  . megestrol (MEGACE) 20 MG tablet TAKE 1 TABLET BY MOUTH EVERY DAY 90 tablet 3  . polyethylene glycol powder (GLYCOLAX/MIRALAX) powder 1 scoop daily or as needed 255 g 11  . PREMARIN vaginal cream Place 1 application vaginally at bedtime.    . verapamil (VERELAN PM) 240 MG 24 hr capsule TAKE 1 CAPSULE BY MOUTH TWICE A DAY 180 capsule 2  . vitamin C (ASCORBIC ACID) 500 MG tablet Take 500 mg by mouth daily.    Marland Kitchen escitalopram (LEXAPRO) 10 MG tablet Take 10 mg by mouth daily.    . fluticasone (FLONASE) 50 MCG/ACT nasal spray Place 2 sprays into both nostrils daily as needed for allergies.       Results for  orders placed or performed during the hospital encounter of 09/21/19 (from the past 48 hour(s))  Glucose, capillary     Status: None   Collection Time: 09/21/19 11:42 AM  Result Value Ref Range   Glucose-Capillary 89 70 - 99 mg/dL   No results found.  ROS  Blood pressure (!) 163/77, pulse (!) 114, temperature 98.9 F (37.2 C), temperature source Oral, resp. rate 18, height 5\' 6"  (1.676 m), weight (!) 148.5 kg, SpO2 100 %. Physical Exam  Constitutional:  Obese female in NAD.  HENT:  Mouth/Throat: Oropharynx is clear and moist.  Eyes: Conjunctivae are normal. No scleral icterus.  Neck: No thyromegaly present.  Cardiovascular: Normal rate, regular rhythm and normal heart sounds.  No murmur heard. Respiratory: Effort normal and breath sounds normal.  GI:  Abdomen is full but soft and nontender with organomegaly or masses.  Musculoskeletal:     Comments: Nonpitting pretibial edema.  Lymphadenopathy:    She has no cervical adenopathy.  Skin: Skin is warm and dry.     Assessment/Plan  Average risk screening colonoscopy.  Hildred Laser, MD 09/21/2019, 12:23 PM

## 2019-09-22 LAB — SURGICAL PATHOLOGY

## 2019-09-27 ENCOUNTER — Encounter (HOSPITAL_COMMUNITY): Payer: Self-pay | Admitting: Internal Medicine

## 2019-09-29 DIAGNOSIS — J302 Other seasonal allergic rhinitis: Secondary | ICD-10-CM | POA: Diagnosis not present

## 2019-09-29 DIAGNOSIS — Z6841 Body Mass Index (BMI) 40.0 and over, adult: Secondary | ICD-10-CM | POA: Diagnosis not present

## 2019-09-29 DIAGNOSIS — R0982 Postnasal drip: Secondary | ICD-10-CM | POA: Diagnosis not present

## 2019-10-21 DIAGNOSIS — R69 Illness, unspecified: Secondary | ICD-10-CM | POA: Diagnosis not present

## 2019-10-21 DIAGNOSIS — I10 Essential (primary) hypertension: Secondary | ICD-10-CM | POA: Diagnosis not present

## 2019-10-31 DIAGNOSIS — J302 Other seasonal allergic rhinitis: Secondary | ICD-10-CM | POA: Diagnosis not present

## 2019-10-31 DIAGNOSIS — J029 Acute pharyngitis, unspecified: Secondary | ICD-10-CM | POA: Diagnosis not present

## 2019-10-31 DIAGNOSIS — Z6841 Body Mass Index (BMI) 40.0 and over, adult: Secondary | ICD-10-CM | POA: Diagnosis not present

## 2019-11-10 ENCOUNTER — Other Ambulatory Visit: Payer: Self-pay | Admitting: Obstetrics & Gynecology

## 2019-11-29 DIAGNOSIS — G894 Chronic pain syndrome: Secondary | ICD-10-CM | POA: Diagnosis not present

## 2019-12-11 DIAGNOSIS — R070 Pain in throat: Secondary | ICD-10-CM | POA: Diagnosis not present

## 2019-12-11 DIAGNOSIS — K219 Gastro-esophageal reflux disease without esophagitis: Secondary | ICD-10-CM | POA: Diagnosis not present

## 2019-12-21 DIAGNOSIS — E7849 Other hyperlipidemia: Secondary | ICD-10-CM | POA: Diagnosis not present

## 2019-12-21 DIAGNOSIS — I10 Essential (primary) hypertension: Secondary | ICD-10-CM | POA: Diagnosis not present

## 2019-12-27 ENCOUNTER — Other Ambulatory Visit: Payer: Self-pay | Admitting: Obstetrics & Gynecology

## 2020-01-21 ENCOUNTER — Other Ambulatory Visit: Payer: Self-pay | Admitting: Cardiology

## 2020-01-21 DIAGNOSIS — D509 Iron deficiency anemia, unspecified: Secondary | ICD-10-CM | POA: Diagnosis not present

## 2020-01-21 DIAGNOSIS — G894 Chronic pain syndrome: Secondary | ICD-10-CM | POA: Diagnosis not present

## 2020-01-21 DIAGNOSIS — R69 Illness, unspecified: Secondary | ICD-10-CM | POA: Diagnosis not present

## 2020-01-21 DIAGNOSIS — I10 Essential (primary) hypertension: Secondary | ICD-10-CM | POA: Diagnosis not present

## 2020-01-22 DIAGNOSIS — R490 Dysphonia: Secondary | ICD-10-CM | POA: Diagnosis not present

## 2020-01-22 DIAGNOSIS — K219 Gastro-esophageal reflux disease without esophagitis: Secondary | ICD-10-CM | POA: Diagnosis not present

## 2020-01-29 DIAGNOSIS — Z0000 Encounter for general adult medical examination without abnormal findings: Secondary | ICD-10-CM | POA: Diagnosis not present

## 2020-01-29 DIAGNOSIS — M545 Low back pain: Secondary | ICD-10-CM | POA: Diagnosis not present

## 2020-01-29 DIAGNOSIS — E559 Vitamin D deficiency, unspecified: Secondary | ICD-10-CM | POA: Diagnosis not present

## 2020-01-29 DIAGNOSIS — G894 Chronic pain syndrome: Secondary | ICD-10-CM | POA: Diagnosis not present

## 2020-01-29 DIAGNOSIS — Z1389 Encounter for screening for other disorder: Secondary | ICD-10-CM | POA: Diagnosis not present

## 2020-01-29 DIAGNOSIS — E782 Mixed hyperlipidemia: Secondary | ICD-10-CM | POA: Diagnosis not present

## 2020-01-29 DIAGNOSIS — M19019 Primary osteoarthritis, unspecified shoulder: Secondary | ICD-10-CM | POA: Diagnosis not present

## 2020-01-29 DIAGNOSIS — Z6841 Body Mass Index (BMI) 40.0 and over, adult: Secondary | ICD-10-CM | POA: Diagnosis not present

## 2020-01-29 DIAGNOSIS — E119 Type 2 diabetes mellitus without complications: Secondary | ICD-10-CM | POA: Diagnosis not present

## 2020-01-29 DIAGNOSIS — I10 Essential (primary) hypertension: Secondary | ICD-10-CM | POA: Diagnosis not present

## 2020-01-29 DIAGNOSIS — E039 Hypothyroidism, unspecified: Secondary | ICD-10-CM | POA: Diagnosis not present

## 2020-01-29 DIAGNOSIS — E7849 Other hyperlipidemia: Secondary | ICD-10-CM | POA: Diagnosis not present

## 2020-02-06 DIAGNOSIS — Z6841 Body Mass Index (BMI) 40.0 and over, adult: Secondary | ICD-10-CM | POA: Diagnosis not present

## 2020-02-06 DIAGNOSIS — M1991 Primary osteoarthritis, unspecified site: Secondary | ICD-10-CM | POA: Diagnosis not present

## 2020-02-06 DIAGNOSIS — K219 Gastro-esophageal reflux disease without esophagitis: Secondary | ICD-10-CM | POA: Diagnosis not present

## 2020-02-06 DIAGNOSIS — M5410 Radiculopathy, site unspecified: Secondary | ICD-10-CM | POA: Diagnosis not present

## 2020-02-06 DIAGNOSIS — I10 Essential (primary) hypertension: Secondary | ICD-10-CM | POA: Diagnosis not present

## 2020-02-06 DIAGNOSIS — M47816 Spondylosis without myelopathy or radiculopathy, lumbar region: Secondary | ICD-10-CM | POA: Diagnosis not present

## 2020-02-18 DIAGNOSIS — R69 Illness, unspecified: Secondary | ICD-10-CM | POA: Diagnosis not present

## 2020-02-18 DIAGNOSIS — I10 Essential (primary) hypertension: Secondary | ICD-10-CM | POA: Diagnosis not present

## 2020-02-18 DIAGNOSIS — G894 Chronic pain syndrome: Secondary | ICD-10-CM | POA: Diagnosis not present

## 2020-02-18 DIAGNOSIS — D509 Iron deficiency anemia, unspecified: Secondary | ICD-10-CM | POA: Diagnosis not present

## 2020-02-23 ENCOUNTER — Other Ambulatory Visit: Payer: Self-pay | Admitting: Obstetrics & Gynecology

## 2020-02-23 ENCOUNTER — Encounter: Payer: Self-pay | Admitting: Obstetrics & Gynecology

## 2020-02-23 ENCOUNTER — Other Ambulatory Visit: Payer: Self-pay

## 2020-02-23 ENCOUNTER — Ambulatory Visit: Payer: Medicare HMO | Admitting: Obstetrics & Gynecology

## 2020-02-23 VITALS — BP 156/82 | HR 96 | Ht 66.0 in | Wt 325.2 lb

## 2020-02-23 DIAGNOSIS — N950 Postmenopausal bleeding: Secondary | ICD-10-CM | POA: Diagnosis not present

## 2020-02-23 NOTE — Addendum Note (Signed)
Addended by: Linton Rump on: 02/23/2020 01:52 PM   Modules accepted: Orders

## 2020-02-23 NOTE — Progress Notes (Signed)
Endometrial Biopsy Procedure Note  Pre-operative Diagnosis: PMB on chronic megestrol therapy 20 mg daily for endometrial suppression  Post-operative Diagnosis: same  Indications: postmenopausal bleeding  Procedure Details   Urine pregnancy test was not done.  The risks (including infection, bleeding, pain, and uterine perforation) and benefits of the procedure were explained to the patient and Written informed consent was obtained.  Antibiotic prophylaxis against endocarditis was not indicated.   The patient was placed in the dorsal lithotomy position.  Bimanual exam showed the uterus to be in the neutral position.  A Graves' speculum inserted in the vagina, and the cervix prepped with povidone iodine.  Endocervical curettage with a Kevorkian curette was not performed.   A sharp tenaculum was applied to the anterior lip of the cervix for stabilization.  A sterile uterine sound was used to sound the uterus to a depth of 6.5cm.  A Pipelle endometrial aspirator was used to sample the endometrium.  Sample was sent for pathologic examination.  Condition: Stable  Complications: None  Plan:  The patient was advised to call for any fever or for prolonged or severe pain or bleeding. She was advised to use OTC analgesics as needed for mild to moderate pain. She was advised to avoid vaginal intercourse for 48 hours or until the bleeding has completely stopped.  Attending Physician Documentation: I performed the biopsy myself

## 2020-03-08 ENCOUNTER — Other Ambulatory Visit: Payer: Self-pay | Admitting: Obstetrics & Gynecology

## 2020-03-08 DIAGNOSIS — N950 Postmenopausal bleeding: Secondary | ICD-10-CM

## 2020-03-11 ENCOUNTER — Ambulatory Visit (INDEPENDENT_AMBULATORY_CARE_PROVIDER_SITE_OTHER): Payer: Medicare HMO | Admitting: Obstetrics & Gynecology

## 2020-03-11 ENCOUNTER — Encounter: Payer: Self-pay | Admitting: Obstetrics & Gynecology

## 2020-03-11 ENCOUNTER — Other Ambulatory Visit: Payer: Self-pay

## 2020-03-11 ENCOUNTER — Ambulatory Visit (INDEPENDENT_AMBULATORY_CARE_PROVIDER_SITE_OTHER): Payer: Medicare HMO

## 2020-03-11 VITALS — Ht 66.0 in | Wt 328.0 lb

## 2020-03-11 DIAGNOSIS — N950 Postmenopausal bleeding: Secondary | ICD-10-CM | POA: Diagnosis not present

## 2020-03-11 NOTE — Progress Notes (Signed)
Follow up appointment for results  Chief Complaint  Patient presents with  . Follow-up    Ultrasound Results    Height 5\' 6"  (1.676 m), weight (!) 328 lb (148.8 kg).  US Transvaginal Non-OB  Result Date: 03/11/2020 GYNECOLOGIC SONOGRAM Megan Oneal is a 64 y.o. G3P3 No LMP recorded. Patient is postmenopausal. She is here for a pelvic sonogram for postmenopausal bleeding. Uterus                      5.7 x 4 x 4.6 cm, Total uterine volume 54 cc, homogeneous anteverted uterus,wnl Endometrium          8.9 mm, asymmetrical,  thickened endometrium with a small amount of complex fluid,color flow within endometrium(feeder vessel) Right ovary             1.6 x 1.4 x 2.1 cm, wnl Left ovary                2.5 x 1.5 x 2.3 cm, wnl No free fluid Technician Comments: PELVIC US TA/TV: homogeneous anteverted uterus,wnl,asymmetrical thickened endometrium with a small amount of complex fluid, EEC 8.9 mm,color flow within endometrium(feeder vessel) ,normal ovaries,ovaries appear mobile,no free fluid,no pain during ultrasound [employer] 03/11/2020 3:25 PM Clinical Impression and recommendations: I have reviewed the sonogram results above, combined with the patient's current clinical course, below are my impressions and any appropriate recommendations for management based on the sonographic findings. uteru is normal with small endometrial polyp present Endometrial thickness normal with megestrol therapy and sampling is normal as well(exogenous hormone therapy) Ovaries normal Florian Buff 03/11/2020 3:41 PM  US Pelvis Complete  Result Date: 03/11/2020 GYNECOLOGIC SONOGRAM Megan Oneal is a 64 y.o. G3P3 No LMP recorded. Patient is postmenopausal. She is here for a pelvic sonogram for postmenopausal bleeding. Uterus                      5.7 x 4 x 4.6 cm, Total uterine volume 54 cc, homogeneous anteverted uterus,wnl Endometrium          8.9 mm, asymmetrical,  thickened endometrium with a small amount of complex fluid,color  flow within endometrium(feeder vessel) Right ovary             1.6 x 1.4 x 2.1 cm, wnl Left ovary                2.5 x 1.5 x 2.3 cm, wnl No free fluid Technician Comments: PELVIC US TA/TV: homogeneous anteverted uterus,wnl,asymmetrical thickened endometrium with a small amount of complex fluid, EEC 8.9 mm,color flow within endometrium(feeder vessel) ,normal ovaries,ovaries appear mobile,no free fluid,no pain during ultrasound [employer] 03/11/2020 3:25 PM Clinical Impression and recommendations: I have reviewed the sonogram results above, combined with the patient's current clinical course, below are my impressions and any appropriate recommendations for management based on the sonographic findings. uteru is normal with small endometrial polyp present Endometrial thickness normal with megestrol therapy and sampling is normal as well(exogenous hormone therapy) Ovaries normal Florian Buff 03/11/2020 3:41 PM     MEDS ordered this encounter: No orders of the defined types were placed in this encounter.   Orders for this encounter: No orders of the defined types were placed in this encounter.   Impression: PMB on megestrol with negative endometrial patholgy Possible small polyp  Plan: Stop megestrol for 1 month for endometrial sloughing,  Then restart at 20 mg megace estrogen production from peripheral fatdaily  to manage her ongoing endometrial stimulation by exogenous  Follow Up: No follow-ups on file.       Face to face time:  10 minutes  Greater than 50% of the visit time was spent in counseling and coordination of care with the patient.  The summary and outline of the counseling and care coordination is summarized in the note above.   All questions were answered.  Past Medical History:  Diagnosis Date  . Anxiety 03/12/2013  . Degenerative disc disease, lumbar    Epidural injections in 2007  . Diabetes mellitus without complication ([place])   . Endometriosis   . Frequent PVCs     12,000/24 hours  . Hypertension   . Otalgia, unspecified   . Palpitations 10/04/2010  . Vascular bruit 01/2013   Right neck    Past Surgical History:  Procedure Laterality Date  . BREAST SURGERY     benign tumor excised-age 81  . CARDIOVASCULAR STRESS TEST  03/13/2008   No scintigraphic evidence of inducible myocardial ischemia, EKG negative for ischemia, no ECG changes.  . COLONOSCOPY  2009  . COLONOSCOPY N/A 09/21/2019   Procedure: COLONOSCOPY;  Surgeon: Rogene Houston, MD;  [facility];  Service: Endoscopy;  Laterality: N/A;  2:00-10:30  . CYST EXCISION  2007   Scalp  . DOPPLER ECHOCARDIOGRAPHY  03/13/2008   EF 123456, LV systolic function is normal, LV wall function is normal  . LAPAROSCOPIC CHOLECYSTECTOMY  2010  . LUMBAR LAMINECTOMY  2009    L4-L5 and L5-S1 laminectomy with diskectomy; transforaminal  . POLYPECTOMY  09/21/2019   Procedure: POLYPECTOMY;  Surgeon: Rogene Houston, MD;  Location: [facility];  Service: Endoscopy;;  . REPLACEMENT TOTAL KNEE    . SLEEP STUDY  10/29/2009   AHI-1.8/hr, during REM 6.4/hr; RDI-8.0/hr, during REM 15.0/hr; avg oxygen during REM and NREM was 94%  . TOTAL KNEE ARTHROPLASTY Bilateral   . TUBAL LIGATION      OB History    Gravida  3   Para  3   Term      Preterm      AB      Living  3     SAB      TAB      Ectopic      Multiple      Live Births              Allergies  Allergen Reactions  . Metoprolol Other (See Comments)    Fatigue and malaise; wheezing  . Sulfa Antibiotics     Social History   Socioeconomic History  . Marital status: Divorced    Spouse name: Not on file  . Number of children: 3  . Years of education: Not on file  . Highest education level: Not on file  Occupational History  . Not on file  Tobacco Use  . Smoking status: Former Smoker    Packs/day: 2.00    Years: 20.00    Pack years: 40.00    Types: Cigarettes    Start date: 08/03/1972    Quit date: 08/03/1996     Years since quitting: 23.6  . Smokeless tobacco: Former Systems developer    Quit date: 12/21/1994  Substance and Sexual Activity  . Alcohol use: No  . Drug use: No  . Sexual activity: Not Currently    Birth control/protection: Post-menopausal  Other Topics Concern  . Not on file  Social History Narrative  . Not on file   Social  Determinants of Health   Financial Resource Strain:   . Difficulty of Paying Living Expenses:   Food Insecurity:   . Worried About Charity fundraiser in the Last Year:   . Arboriculturist in the Last Year:   Transportation Needs:   . Film/video editor (Medical):   Marland Kitchen Lack of Transportation (Non-Medical):   Physical Activity:   . Days of Exercise per Week:   . Minutes of Exercise per Session:   Stress:   . Feeling of Stress :   Social Connections:   . Frequency of Communication with Friends and Family:   . Frequency of Social Gatherings with Friends and Family:   . Attends Religious Services:   . Active Member of Clubs or Organizations:   . Attends Archivist Meetings:   Marland Kitchen Marital Status:     Family History  Problem Relation Age of Onset  . Diabetes Mother   . Hypertension Mother   . Diabetes Brother   . Hypertension Brother   . Hypertension Father

## 2020-03-11 NOTE — Progress Notes (Addendum)
PELVIC US TA/TV: homogeneous anteverted uterus,wnl,asymmetrical thickened endometrium with a small amount of complex fluid, EEC 8.9 mm,color flow within endometrium(feeder vessel) ,normal ovaries,ovaries appear mobile,no free fluid,no pain during ultrasound   Chaperone Estill Bamberg

## 2020-03-20 DIAGNOSIS — E7849 Other hyperlipidemia: Secondary | ICD-10-CM | POA: Diagnosis not present

## 2020-03-20 DIAGNOSIS — I10 Essential (primary) hypertension: Secondary | ICD-10-CM | POA: Diagnosis not present

## 2020-03-20 DIAGNOSIS — D509 Iron deficiency anemia, unspecified: Secondary | ICD-10-CM | POA: Diagnosis not present

## 2020-04-22 DIAGNOSIS — K219 Gastro-esophageal reflux disease without esophagitis: Secondary | ICD-10-CM | POA: Diagnosis not present

## 2020-04-22 DIAGNOSIS — R070 Pain in throat: Secondary | ICD-10-CM | POA: Diagnosis not present

## 2020-04-26 DIAGNOSIS — G894 Chronic pain syndrome: Secondary | ICD-10-CM | POA: Diagnosis not present

## 2020-05-08 DIAGNOSIS — K0889 Other specified disorders of teeth and supporting structures: Secondary | ICD-10-CM | POA: Diagnosis not present

## 2020-05-08 DIAGNOSIS — Z6841 Body Mass Index (BMI) 40.0 and over, adult: Secondary | ICD-10-CM | POA: Diagnosis not present

## 2020-05-08 DIAGNOSIS — J329 Chronic sinusitis, unspecified: Secondary | ICD-10-CM | POA: Diagnosis not present

## 2020-05-20 DIAGNOSIS — E7849 Other hyperlipidemia: Secondary | ICD-10-CM | POA: Diagnosis not present

## 2020-05-20 DIAGNOSIS — R69 Illness, unspecified: Secondary | ICD-10-CM | POA: Diagnosis not present

## 2020-05-20 DIAGNOSIS — I10 Essential (primary) hypertension: Secondary | ICD-10-CM | POA: Diagnosis not present

## 2020-05-20 DIAGNOSIS — D509 Iron deficiency anemia, unspecified: Secondary | ICD-10-CM | POA: Diagnosis not present

## 2020-05-20 DIAGNOSIS — G894 Chronic pain syndrome: Secondary | ICD-10-CM | POA: Diagnosis not present

## 2020-08-28 DIAGNOSIS — R69 Illness, unspecified: Secondary | ICD-10-CM | POA: Diagnosis not present

## 2020-09-19 DIAGNOSIS — I10 Essential (primary) hypertension: Secondary | ICD-10-CM | POA: Diagnosis not present

## 2020-09-19 DIAGNOSIS — M5410 Radiculopathy, site unspecified: Secondary | ICD-10-CM | POA: Diagnosis not present

## 2020-09-19 DIAGNOSIS — G894 Chronic pain syndrome: Secondary | ICD-10-CM | POA: Diagnosis not present

## 2020-09-19 DIAGNOSIS — Z6841 Body Mass Index (BMI) 40.0 and over, adult: Secondary | ICD-10-CM | POA: Diagnosis not present

## 2020-09-19 DIAGNOSIS — D509 Iron deficiency anemia, unspecified: Secondary | ICD-10-CM | POA: Diagnosis not present

## 2020-09-19 DIAGNOSIS — R69 Illness, unspecified: Secondary | ICD-10-CM | POA: Diagnosis not present

## 2020-09-19 DIAGNOSIS — M5136 Other intervertebral disc degeneration, lumbar region: Secondary | ICD-10-CM | POA: Diagnosis not present

## 2020-09-19 DIAGNOSIS — H6123 Impacted cerumen, bilateral: Secondary | ICD-10-CM | POA: Diagnosis not present

## 2020-09-27 DIAGNOSIS — R69 Illness, unspecified: Secondary | ICD-10-CM | POA: Diagnosis not present

## 2020-10-03 ENCOUNTER — Other Ambulatory Visit: Payer: Self-pay | Admitting: Cardiology

## 2020-10-19 DIAGNOSIS — I10 Essential (primary) hypertension: Secondary | ICD-10-CM | POA: Diagnosis not present

## 2020-10-19 DIAGNOSIS — G894 Chronic pain syndrome: Secondary | ICD-10-CM | POA: Diagnosis not present

## 2020-10-19 DIAGNOSIS — D509 Iron deficiency anemia, unspecified: Secondary | ICD-10-CM | POA: Diagnosis not present

## 2020-10-19 DIAGNOSIS — R69 Illness, unspecified: Secondary | ICD-10-CM | POA: Diagnosis not present

## 2020-11-12 DIAGNOSIS — Z6841 Body Mass Index (BMI) 40.0 and over, adult: Secondary | ICD-10-CM | POA: Diagnosis not present

## 2020-11-12 DIAGNOSIS — R1011 Right upper quadrant pain: Secondary | ICD-10-CM | POA: Diagnosis not present

## 2020-11-25 ENCOUNTER — Other Ambulatory Visit: Payer: Self-pay

## 2020-11-25 ENCOUNTER — Ambulatory Visit: Payer: Medicare HMO | Admitting: Cardiology

## 2020-11-25 ENCOUNTER — Encounter: Payer: Self-pay | Admitting: Cardiology

## 2020-11-25 VITALS — BP 142/98 | HR 95 | Ht 66.0 in | Wt 334.8 lb

## 2020-11-25 DIAGNOSIS — R011 Cardiac murmur, unspecified: Secondary | ICD-10-CM

## 2020-11-25 DIAGNOSIS — I10 Essential (primary) hypertension: Secondary | ICD-10-CM | POA: Diagnosis not present

## 2020-11-25 DIAGNOSIS — R002 Palpitations: Secondary | ICD-10-CM | POA: Diagnosis not present

## 2020-11-25 NOTE — Patient Instructions (Signed)
Medication Instructions:  Your physician recommends that you continue on your current medications as directed. Please refer to the Current Medication list given to you today.  *If you need a refill on your cardiac medications before your next appointment, please call your pharmacy*   Lab Work: None today If you have labs (blood work) drawn today and your tests are completely normal, you will receive your results only by: Marland Kitchen MyChart Message (if you have MyChart) OR . A paper copy in the mail If you have any lab test that is abnormal or we need to change your treatment, we will call you to review the results.   Testing/Procedures: Your physician has requested that you have an echocardiogram. Echocardiography is a painless test that uses sound waves to create images of your heart. It provides your doctor with information about the size and shape of your heart and how well your heart's chambers and valves are working. This procedure takes approximately one hour. There are no restrictions for this procedure.     Follow-Up: At [facility], you and your health needs are our priority.  As part of our continuing mission to provide you with exceptional heart care, we have created designated Provider Care Teams.  These Care Teams include your primary Cardiologist (physician) and Advanced Practice Providers (APPs -  Physician Assistants and Nurse Practitioners) who all work together to provide you with the care you need, when you need it.  We recommend signing up for the patient portal called "MyChart".  Sign up information is provided on this After Visit Summary.  MyChart is used to connect with patients for Virtual Visits (Telemedicine).  Patients are able to view lab/test results, encounter notes, upcoming appointments, etc.  Non-urgent messages can be sent to your provider as well.   To learn more about what you can do with MyChart, go to [email or web address].    Your next appointment:   1  year(s)  The format for your next appointment:   In Person  Provider:   Carlyle Dolly, MD   Other Instructions You have been referred to nutrition. They will contact you to schedule an appointment.

## 2020-11-25 NOTE — Progress Notes (Signed)
Clinical Summary Megan Oneal is a 64 y.o.female seen today for follow up of the following medical problems.  1. Chest pain - 2016 nuclear stress no ischemia - echo 2016 LVEF >41%, normal diastolic function  - no recent symptoms   2. Palpitations - most recent monitor with PVCs, no arrhythmias - previous notes mention a history of PSVT, details are unclear.  -  Side effects on lopressor, reports prn coreg did not work.   - mild symptoms at times though overall stable.   3. OSA screen - she reportssleep studyin 2010     4. HTN - home bp's 130s/70s 3 times a week.  - has not taken losartan.    Past Medical History:  Diagnosis Date  . Anxiety 03/12/2013  . Degenerative disc disease, lumbar    Epidural injections in 2007  . Diabetes mellitus without complication (Megan Oneal)   . Endometriosis   . Frequent PVCs    12,000/24 hours  . Hypertension   . Otalgia, unspecified   . Palpitations 10/04/2010  . Vascular bruit 01/2013   Right neck     Allergies  Allergen Reactions  . Metoprolol Other (See Comments)    Fatigue and malaise; wheezing  . Sulfa Antibiotics      Current Outpatient Medications  Medication Sig Dispense Refill  . aspirin 81 MG tablet Take 81 mg by mouth daily.    Marland Kitchen azelastine (OPTIVAR) 0.05 % ophthalmic solution Apply 1 drop to eye 2 (two) times daily.    . cholecalciferol (VITAMIN D3) 25 MCG (1000 UT) tablet Take 1,000 Units by mouth daily.    Marland Kitchen dexlansoprazole (DEXILANT) 60 MG capsule Take 60 mg by mouth daily.    Marland Kitchen diltiazem (CARDIZEM) 30 MG tablet TAKE 1 TABLET (30 MG TOTAL) BY MOUTH 2 (TWO) TIMES DAILY AS NEEDED (PALPITATIONS). 180 tablet 2  . escitalopram (LEXAPRO) 10 MG tablet Take 10 mg by mouth daily.    . famotidine (PEPCID) 20 MG tablet Take 20 mg by mouth at bedtime.    . ferrous sulfate 325 (65 FE) MG tablet Take 325 mg by mouth daily with breakfast.     . fluticasone (FLONASE) 50 MCG/ACT nasal spray Place 2 sprays into  both nostrils daily as needed for allergies.     . furosemide (LASIX) 40 MG tablet Take 40 mg by mouth every other day.     Marland Kitchen HYDROcodone-acetaminophen (NORCO/VICODIN) 5-325 MG per tablet Take 1 tablet by mouth every 4 (four) hours as needed for moderate pain or severe pain.   0  . levocetirizine (XYZAL) 5 MG tablet Take 1 tablet by mouth every evening.     Marland Kitchen losartan (COZAAR) 100 MG tablet Take 100 mg by mouth daily.    . meclizine (ANTIVERT) 25 MG tablet Take 1 tablet by mouth 2 (two) times daily as needed for dizziness or nausea.   3  . megestrol (MEGACE) 20 MG tablet TAKE 1 TABLET BY MOUTH EVERY DAY 90 tablet 3  . meloxicam (MOBIC) 15 MG tablet Take 15 mg by mouth daily as needed.    . polyethylene glycol powder (GLYCOLAX/MIRALAX) powder 1 scoop daily or as needed 255 g 11  . PREMARIN vaginal cream PLACE 1 APPLICATORFUL VAGINALLY DAILY. 30 g 12  . verapamil (VERELAN PM) 240 MG 24 hr capsule TAKE 1 CAPSULE BY MOUTH TWICE A DAY 180 capsule 2  . vitamin C (ASCORBIC ACID) 500 MG tablet Take 500 mg by mouth daily.     No current  facility-administered medications for this visit.     Past Surgical History:  Procedure Laterality Date  . BREAST SURGERY     benign tumor excised-age 47  . CARDIOVASCULAR STRESS TEST  03/13/2008   No scintigraphic evidence of inducible myocardial ischemia, EKG negative for ischemia, no ECG changes.  . COLONOSCOPY  2009  . COLONOSCOPY N/A 09/21/2019   Procedure: COLONOSCOPY;  Surgeon: Rogene Houston, MD;  [facility];  Service: Endoscopy;  Laterality: N/A;  2:00-10:30  . CYST EXCISION  2007   Scalp  . DOPPLER ECHOCARDIOGRAPHY  03/13/2008   EF >62%, LV systolic function is normal, LV wall function is normal  . LAPAROSCOPIC CHOLECYSTECTOMY  2010  . LUMBAR LAMINECTOMY  2009    L4-L5 and L5-S1 laminectomy with diskectomy; transforaminal  . POLYPECTOMY  09/21/2019   Procedure: POLYPECTOMY;  Surgeon: Rogene Houston, MD;  [facility];   Service: Endoscopy;;  . REPLACEMENT TOTAL KNEE    . SLEEP STUDY  10/29/2009   AHI-1.8/hr, during REM 6.4/hr; RDI-8.0/hr, during REM 15.0/hr; avg oxygen during REM and NREM was 94%  . TOTAL KNEE ARTHROPLASTY Bilateral   . TUBAL LIGATION       Allergies  Allergen Reactions  . Metoprolol Other (See Comments)    Fatigue and malaise; wheezing  . Sulfa Antibiotics       Family History  Problem Relation Age of Onset  . Diabetes Mother   . Hypertension Mother   . Diabetes Brother   . Hypertension Brother   . Hypertension Father      Social History Megan Oneal reports that she quit smoking about 24 years ago. Her smoking use included cigarettes. She started smoking about 48 years ago. She has a 40.00 pack-year smoking history. She quit smokeless tobacco use about 25 years ago. Megan Oneal reports no history of alcohol use.   Review of Systems CONSTITUTIONAL: No weight loss, fever, chills, weakness or fatigue.  HEENT: Eyes: No visual loss, blurred vision, double vision or yellow sclerae.No hearing loss, sneezing, congestion, runny nose or sore throat.  SKIN: No rash or itching.  CARDIOVASCULAR: per hpi RESPIRATORY: No shortness of breath, cough or sputum.  GASTROINTESTINAL: No anorexia, nausea, vomiting or diarrhea. No abdominal pain or blood.  GENITOURINARY: No burning on urination, no polyuria NEUROLOGICAL: No headache, dizziness, syncope, paralysis, ataxia, numbness or tingling in the extremities. No change in bowel or bladder control.  MUSCULOSKELETAL: No muscle, back pain, joint pain or stiffness.  LYMPHATICS: No enlarged nodes. No history of splenectomy.  PSYCHIATRIC: No history of depression or anxiety.  ENDOCRINOLOGIC: No reports of sweating, cold or heat intolerance. No polyuria or polydipsia.  Marland Kitchen   Physical Examination Today's Vitals   11/25/20 0833  BP: (!) 142/98  Pulse: 95  SpO2: 97%  Weight: (!) 334 lb 12.8 oz (151.9 kg)  Height: 5\' 6"  (1.676 m)   Body mass  index is 54.04 kg/m.  Gen: resting comfortably, no acute distress HEENT: no scleral icterus, pupils equal round and reactive, no palptable cervical adenopathy,  CV: RRR, 2/6 systolic murmur apex, no jvd Resp: Clear to auscultation bilaterally GI: abdomen is soft, non-tender, non-distended, normal bowel sounds, no hepatosplenomegaly MSK: extremities are warm, no edema.  Skin: warm, no rash Neuro:  no focal deficits Psych: appropriate affect   Diagnostic Studies     Assessment and Plan   1. Palpitations -She reports some side effects to lopressor prevoiusly.prn coreg did not help - remains on her verpamil fairly high  dose and prn dilt - overall symptoms controlled, continue current meds  2. Heart murmur - obtain echo  3. Severe obesity - refer to nutrition  4. HTN - elevated bp today but has not taken losartan yet - home bp's are at goal - continue current meds  F/u 1 year    Arnoldo Lenis, M.D

## 2020-11-28 ENCOUNTER — Ambulatory Visit (HOSPITAL_COMMUNITY)
Admission: RE | Admit: 2020-11-28 | Discharge: 2020-11-28 | Disposition: A | Discharge status: Home / Self Care | Payer: Medicare HMO | Source: Ambulatory Visit | Attending: Cardiology | Admitting: Cardiology

## 2020-11-28 ENCOUNTER — Other Ambulatory Visit: Payer: Self-pay

## 2020-11-28 DIAGNOSIS — R011 Cardiac murmur, unspecified: Secondary | ICD-10-CM | POA: Diagnosis not present

## 2020-11-28 LAB — ECHOCARDIOGRAM COMPLETE
AR max vel: 2.41 cm2
AV Area VTI: 2.27 cm2
AV Area mean vel: 2.64 cm2
AV Mean grad: 5.7 mmHg
AV Peak grad: 11 mmHg
Ao pk vel: 1.66 m/s
Area-P 1/2: 2.93 cm2
S' Lateral: 3 cm

## 2020-11-28 NOTE — Progress Notes (Signed)
*  PRELIMINARY RESULTS* Echocardiogram 2D Echocardiogram has been performed.  Megan Oneal 11/28/2020, 9:04 AM

## 2020-12-05 ENCOUNTER — Telehealth: Payer: Self-pay | Admitting: *Deleted

## 2020-12-05 NOTE — Telephone Encounter (Signed)
Pt aware.

## 2020-12-05 NOTE — Telephone Encounter (Signed)
-----   Message from Arnoldo Lenis, MD sent at 12/02/2020  9:32 AM EST ----- Echo shows normal heart function   Zandra Abts MD

## 2020-12-20 DIAGNOSIS — R69 Illness, unspecified: Secondary | ICD-10-CM | POA: Diagnosis not present

## 2020-12-20 DIAGNOSIS — G894 Chronic pain syndrome: Secondary | ICD-10-CM | POA: Diagnosis not present

## 2020-12-20 DIAGNOSIS — D509 Iron deficiency anemia, unspecified: Secondary | ICD-10-CM | POA: Diagnosis not present

## 2020-12-20 DIAGNOSIS — I10 Essential (primary) hypertension: Secondary | ICD-10-CM | POA: Diagnosis not present

## 2020-12-27 DIAGNOSIS — Z1331 Encounter for screening for depression: Secondary | ICD-10-CM | POA: Diagnosis not present

## 2020-12-27 DIAGNOSIS — M5136 Other intervertebral disc degeneration, lumbar region: Secondary | ICD-10-CM | POA: Diagnosis not present

## 2020-12-27 DIAGNOSIS — Z6841 Body Mass Index (BMI) 40.0 and over, adult: Secondary | ICD-10-CM | POA: Diagnosis not present

## 2020-12-29 ENCOUNTER — Other Ambulatory Visit: Payer: Self-pay | Admitting: Obstetrics & Gynecology

## 2020-12-31 ENCOUNTER — Encounter: Payer: Medicare HMO | Attending: Cardiology | Admitting: Nutrition

## 2020-12-31 ENCOUNTER — Telehealth: Payer: Self-pay | Admitting: Nutrition

## 2020-12-31 NOTE — Telephone Encounter (Signed)
VM left to call back to do phone visit.

## 2021-01-01 ENCOUNTER — Other Ambulatory Visit: Payer: Self-pay

## 2021-01-01 ENCOUNTER — Encounter: Payer: Self-pay | Admitting: Nutrition

## 2021-01-01 ENCOUNTER — Encounter: Payer: Medicare HMO | Attending: Family Medicine | Admitting: Nutrition

## 2021-01-01 VITALS — Ht 66.0 in | Wt 338.0 lb

## 2021-01-01 DIAGNOSIS — I10 Essential (primary) hypertension: Secondary | ICD-10-CM

## 2021-01-01 DIAGNOSIS — E66813 Obesity, class 3: Secondary | ICD-10-CM

## 2021-01-01 NOTE — Progress Notes (Signed)
Medical Nutrition Therapy  This visit was completed via telephone due to the COVID-19 pandemic.   I spoke with Megan Oneal and verified that I was speaking with the correct person with two patient identifiers (full name and date of birth).   I discussed the limitations related to this kind of visit and the patient is willing to proceed.  Appointment Start time: 0930   Appointment End time: 71 Primary concerns today: Severe obesity, Prediabetes, HTN Referral diagnosis: E11.8. r73.9,i10.0 Preferred learning style:  no preference indicated Learning readiness:  ready   NUTRITION ASSESSMENT   Anthropometrics  Wt Readings from Last 3 Encounters:  01/01/21 (!) 338 lb (153.3 kg)  11/25/20 (!) 334 lb 12.8 oz (151.9 kg)  03/11/20 (!) 328 lb (148.8 kg)   Ht Readings from Last 3 Encounters:  01/01/21 5\' 6"  (1.676 m)  11/25/20 5\' 6"  (1.676 m)  03/11/20 5\' 6"  (1.676 m)   Body mass index is 54.55 kg/m. @BMIFA @ Facility age limit for growth percentiles is 20 years. Facility age limit for growth percentiles is 20 years.  Clinical Medical Hx:HTN, Anxiety, endometreosis Medications: see chart Labs: AlC 5.9% per patient Notable Signs/Symptoms: Fatigue, joint pain  Lifestyle & Dietary Hx  Estimated daily fluid intake: 40 oz Supplements: none Sleep: 7/8 hrs  Stress / self-care: worries about her kids and grandkids Current average weekly physical activity: ADL   24-Hr Dietary Recall First Meal: Chicken salad sandwich, coffee Snack: 3-4 nabs Second Meal: chicken and greens,  Snack: sf cookies Third Meal: Brussels sprouts, string beans, chicken breast salad sandwich,  Snack: NS eddies ice cream  2 cups Beverages: water, zero coke or [place]  Estimated Energy Needs Calories: 1200 Carbohydrate: 140g Protein: 90g Fat: 35g   NUTRITION DIAGNOSIS  NI-1.7 Predicted excessive energy intake As related to BMI 54 and Prediabetes  As evidenced by diet recall  And A1c of  5.9%.   NUTRITION INTERVENTION  Nutrition education (E-1) on the following topics:  My Plate Weight loss tips Emotional eating Physical activity .   Handouts Provided Include   The Plate Method   Weight loss tips    Learning Style & Readiness for Change Teaching method utilized: Visual & Auditory  Demonstrated degree of understanding via: Teach Back  Barriers to learning/adherence to lifestyle change: none  Goals Established by Pt Goals  Follow My Plate Eat meals on time Increase physical activity in the home. Cut out hot dogs, and processed foods. Cut out sugar free ice cream, cookies,  Lose 2-3 lbs per week. Drink 100 oz of water per day. Marland Kitchen    MONITORING & EVALUATION Dietary intake, weekly physical activity, and weight in 1 month..  Next Steps  Patient is to call MD to see if there is an alternative to Megace.

## 2021-01-01 NOTE — Patient Instructions (Signed)
Goals  Follow My Plate Eat meals on time Increase physical activity in the home. Cut out hot dogs, and processed foods. Cut out sugar free ice cream, cookies,  Lose 2-3 lbs per week. Drink 100 oz of water per day.

## 2021-01-20 DIAGNOSIS — G894 Chronic pain syndrome: Secondary | ICD-10-CM | POA: Diagnosis not present

## 2021-01-20 DIAGNOSIS — D509 Iron deficiency anemia, unspecified: Secondary | ICD-10-CM | POA: Diagnosis not present

## 2021-01-20 DIAGNOSIS — R69 Illness, unspecified: Secondary | ICD-10-CM | POA: Diagnosis not present

## 2021-01-20 DIAGNOSIS — I10 Essential (primary) hypertension: Secondary | ICD-10-CM | POA: Diagnosis not present

## 2021-01-29 ENCOUNTER — Other Ambulatory Visit: Payer: Self-pay

## 2021-01-29 ENCOUNTER — Encounter: Payer: Medicare HMO | Attending: Cardiology | Admitting: Nutrition

## 2021-01-29 VITALS — Ht 66.0 in | Wt 331.0 lb

## 2021-01-29 DIAGNOSIS — F419 Anxiety disorder, unspecified: Secondary | ICD-10-CM

## 2021-01-29 DIAGNOSIS — I10 Essential (primary) hypertension: Secondary | ICD-10-CM

## 2021-01-29 NOTE — Progress Notes (Signed)
Medical Nutrition Therapy  This visit was completed via telephone due to the COVID-19 pandemic.   I spoke with Megan Oneal and verified that I was speaking with the correct person with two patient identifiers (full name and date of birth).   I discussed the limitations related to this kind of visit and the patient is willing to proceed.  Appointment Start time: [phone]   Appointment End time: 1015 Primary concerns today: Severe obesity, Prediabetes, HTN Referral diagnosis: E66.01 r73.9,i10.0 Preferred learning style:  no preference indicated Learning readiness:  ready   NUTRITION ASSESSMENT  Changes made: She notes she has no ice cream, cookies,  Admits to being a stress eater.  Stopped diet sodas and drinking water. Breathing is much better Bones don't hurt as much. BP 130's/70-80's, much better. Lost 7 lbs.  Anthropometrics  Wt Readings from Last 3 Encounters:  01/01/21 (!) 338 lb (153.3 kg)  11/25/20 (!) 334 lb 12.8 oz (151.9 kg)  03/11/20 (!) 328 lb (148.8 kg)   Ht Readings from Last 3 Encounters:  01/01/21 5\' 6"  (1.676 m)  11/25/20 5\' 6"  (1.676 m)  03/11/20 5\' 6"  (1.676 m)   There is no height or weight on file to calculate BMI. @BMIFA @ Facility age limit for growth percentiles is 20 years. Facility age limit for growth percentiles is 20 years.  Clinical Medical Hx:HTN, Anxiety, endometreosis Medications: see chart Labs: AlC 5.9% per patient Notable Signs/Symptoms: Fatigue, joint pain  Lifestyle & Dietary Hx  Estimated daily fluid intake: 40 oz Supplements: none Sleep: 7/8 hrs  Stress / self-care: worries about her kids and grandkids Current average weekly physical activity: ADL   24-Hr Dietary Recall First Meal: Fried egg sandwich and water  Second Meal: chicken and  string greens, water  Snack:  Third Meal: same as lunch, water  Snack:  Beverages: water,   Estimated Energy Needs Calories: 1200 Carbohydrate: 140g Protein: 90g Fat: 35g   NUTRITION  DIAGNOSIS  NI-1.7 Predicted excessive energy intake As related to BMI 54 and Prediabetes  As evidenced by diet recall  And A1c of 5.9%.   NUTRITION INTERVENTION  Nutrition education (E-1) on the following topics: Meal planning CHO carb portions.  Handouts Provided Include     Learning Style & Readiness for Change Teaching method utilized: Visual & Auditory  Demonstrated degree of understanding via: Teach Back  Barriers to learning/adherence to lifestyle change: none  Goals Established by Pt Keep up the great job!  Lose 1-2 lbs per week. Keep eating meals on time. Avoid snacks Walk in the house or outside when able for 15-20 minutes or more a day. Avoid fried and processed foods.   MONITORING & EVALUATION Dietary intake, weekly physical activity, and weight in 1 month..  Next Steps  Patient is to call MD to see if there is an alternative to Megace.

## 2021-02-05 DIAGNOSIS — Z6841 Body Mass Index (BMI) 40.0 and over, adult: Secondary | ICD-10-CM | POA: Diagnosis not present

## 2021-02-05 DIAGNOSIS — Z1331 Encounter for screening for depression: Secondary | ICD-10-CM | POA: Diagnosis not present

## 2021-02-05 DIAGNOSIS — I10 Essential (primary) hypertension: Secondary | ICD-10-CM | POA: Diagnosis not present

## 2021-02-05 DIAGNOSIS — E559 Vitamin D deficiency, unspecified: Secondary | ICD-10-CM | POA: Diagnosis not present

## 2021-02-05 DIAGNOSIS — J302 Other seasonal allergic rhinitis: Secondary | ICD-10-CM | POA: Diagnosis not present

## 2021-02-05 DIAGNOSIS — Z1389 Encounter for screening for other disorder: Secondary | ICD-10-CM | POA: Diagnosis not present

## 2021-02-05 DIAGNOSIS — E7849 Other hyperlipidemia: Secondary | ICD-10-CM | POA: Diagnosis not present

## 2021-02-05 DIAGNOSIS — M1991 Primary osteoarthritis, unspecified site: Secondary | ICD-10-CM | POA: Diagnosis not present

## 2021-02-05 DIAGNOSIS — E119 Type 2 diabetes mellitus without complications: Secondary | ICD-10-CM | POA: Diagnosis not present

## 2021-02-05 DIAGNOSIS — Z0001 Encounter for general adult medical examination with abnormal findings: Secondary | ICD-10-CM | POA: Diagnosis not present

## 2021-02-05 DIAGNOSIS — D509 Iron deficiency anemia, unspecified: Secondary | ICD-10-CM | POA: Diagnosis not present

## 2021-02-14 DIAGNOSIS — H6123 Impacted cerumen, bilateral: Secondary | ICD-10-CM | POA: Diagnosis not present

## 2021-02-14 DIAGNOSIS — Z6841 Body Mass Index (BMI) 40.0 and over, adult: Secondary | ICD-10-CM | POA: Diagnosis not present

## 2021-02-25 ENCOUNTER — Encounter: Payer: Self-pay | Admitting: Nutrition

## 2021-02-25 NOTE — Patient Instructions (Signed)
Goals Established by Pt Lose 1-2 lbs per week. Keep eating meals on time. Avoid snacks Walk in the house or outside when able for 15-20 a day. Avoid fried and processed foods

## 2021-02-26 ENCOUNTER — Ambulatory Visit: Payer: Medicare HMO | Admitting: Nutrition

## 2021-02-26 DIAGNOSIS — H906 Mixed conductive and sensorineural hearing loss, bilateral: Secondary | ICD-10-CM | POA: Diagnosis not present

## 2021-02-26 DIAGNOSIS — H9313 Tinnitus, bilateral: Secondary | ICD-10-CM | POA: Diagnosis not present

## 2021-03-19 DIAGNOSIS — I10 Essential (primary) hypertension: Secondary | ICD-10-CM | POA: Diagnosis not present

## 2021-03-19 DIAGNOSIS — E7849 Other hyperlipidemia: Secondary | ICD-10-CM | POA: Diagnosis not present

## 2021-03-20 ENCOUNTER — Other Ambulatory Visit: Payer: Self-pay

## 2021-03-20 ENCOUNTER — Encounter: Payer: Medicare HMO | Attending: Family Medicine | Admitting: Nutrition

## 2021-03-20 ENCOUNTER — Encounter: Payer: Self-pay | Admitting: Nutrition

## 2021-03-20 VITALS — Ht 66.0 in | Wt 317.0 lb

## 2021-03-20 DIAGNOSIS — I10 Essential (primary) hypertension: Secondary | ICD-10-CM

## 2021-03-20 DIAGNOSIS — E782 Mixed hyperlipidemia: Secondary | ICD-10-CM | POA: Insufficient documentation

## 2021-03-20 DIAGNOSIS — R739 Hyperglycemia, unspecified: Secondary | ICD-10-CM | POA: Insufficient documentation

## 2021-03-20 NOTE — Progress Notes (Signed)
Medical Nutrition Therapy    Appointment Start time: 0930  Appointment End time: 1000  Primary concerns today: Severe obesity, Prediabetes, HTN Referral diagnosis: E66.01 r73.9,i10.0 Preferred learning style:  no preference indicated Learning readiness:  ready  Has Bilateral Otoscleosis. Family history of hearing loss.   NUTRITION ASSESSMENT  Changes made: Lost 15 lbs. .A1C was 6.2%. Has been cutting out sweets, cookies, cut down on bread and no processed foods Eating foods more baked foods and eating more lower carb vegetables. She is feeling better. She is drinking a gallon of water.   She notes she has no ice cream, cookies,  Admits to being a stress eater but has been doing really well with that by calling family and friends instead of eating. Stopped diet sodas and drinking water. Breathing is much better Bones don't hurt as much. BP 130's/70-80's, much better.    Making excellent progress. Wants to get down to 300 lbs.   Wt Readings from Last 3 Encounters:  03/20/21 (!) 317 lb (143.8 kg)  01/29/21 (!) 331 lb (150.1 kg)  01/01/21 (!) 338 lb (153.3 kg)   Ht Readings from Last 3 Encounters:  03/20/21 5\' 6"  (1.676 m)  01/29/21 5\' 6"  (1.676 m)  01/01/21 5\' 6"  (1.676 m)   Body mass index is 51.17 kg/m. @BMIFA @ Facility age limit for growth percentiles is 20 years. Facility age limit for growth percentiles is 20 years.   Anthropometrics There is no height or weight on file to calculate BMI. @BMIFA @ Facility age limit for growth percentiles is 20 years. Facility age limit for growth percentiles is 20 years.  Clinical Medical Hx:HTN, Anxiety, endometreosis Medications: see chart Labs: AlC 5.9% per patient Notable Signs/Symptoms: Fatigue, joint pain  Lifestyle & Dietary Hx  Estimated daily fluid intake: 40 oz Supplements: none Sleep: 7/8 hrs  Stress / self-care: worries about her kids and grandkids Current average weekly physical activity: ADL   24-Hr  Dietary Recall First Meal:Oatmeal and fruit, water Second Meal:  Chicken, carrots, brussels spouts an butternut sqush, water Snack:  Third Meal: Same as lunch, water Snack:  Beverages: water,   Estimated Energy Needs Calories: 1200 Carbohydrate: 140g Protein: 90g Fat: 35g   NUTRITION DIAGNOSIS  NI-1.7 Predicted excessive energy intake As related to BMI 54 and Prediabetes  As evidenced by diet recall  And A1c of 5.9%.   NUTRITION INTERVENTION  Nutrition education (E-1) on the following topics: Meal planning CHO carb portions.  Handouts Provided Include   Diabetes Instructions.  Learning Style & Readiness for Change Teaching method utilized: Visual & Auditory  Demonstrated degree of understanding via: Teach Back  Barriers to learning/adherence to lifestyle change: none  Goals Established by Pt Keep up the great job!  Lose 1-2 lbs per week. Keep eating meals on time. Walk in the house or outside when able for 15-20 minutes or more a day. Try chair exercises at home. Try grown vegetables in flower pots  MONITORING & EVALUATION Dietary intake, weekly physical activity, and weight in 1 month..  Next Steps  Patient is to call MD to see if there is an alternative to Megace.

## 2021-03-20 NOTE — Patient Instructions (Addendum)
  Goals Established by Pt Keep up the great job!  Lose 1-2 lbs per week. Keep eating meals on time. Walk in the house or outside when able for 15-20 minutes or more a day. Try chair exercises at home. Try grown vegetables in flower pots

## 2021-03-21 ENCOUNTER — Emergency Department (HOSPITAL_COMMUNITY)
Admission: EM | Admit: 2021-03-21 | Discharge: 2021-03-21 | Disposition: A | Discharge status: Home / Self Care | Payer: Medicare HMO | Attending: Emergency Medicine | Admitting: Emergency Medicine

## 2021-03-21 ENCOUNTER — Emergency Department (HOSPITAL_COMMUNITY): Payer: Medicare HMO

## 2021-03-21 ENCOUNTER — Encounter (HOSPITAL_COMMUNITY): Payer: Self-pay | Admitting: *Deleted

## 2021-03-21 ENCOUNTER — Other Ambulatory Visit: Payer: Self-pay

## 2021-03-21 DIAGNOSIS — R531 Weakness: Secondary | ICD-10-CM | POA: Insufficient documentation

## 2021-03-21 DIAGNOSIS — R440 Auditory hallucinations: Secondary | ICD-10-CM | POA: Diagnosis not present

## 2021-03-21 DIAGNOSIS — R509 Fever, unspecified: Secondary | ICD-10-CM | POA: Diagnosis not present

## 2021-03-21 DIAGNOSIS — Z79899 Other long term (current) drug therapy: Secondary | ICD-10-CM | POA: Insufficient documentation

## 2021-03-21 DIAGNOSIS — Z87891 Personal history of nicotine dependence: Secondary | ICD-10-CM | POA: Insufficient documentation

## 2021-03-21 DIAGNOSIS — R42 Dizziness and giddiness: Secondary | ICD-10-CM | POA: Diagnosis not present

## 2021-03-21 DIAGNOSIS — E119 Type 2 diabetes mellitus without complications: Secondary | ICD-10-CM | POA: Insufficient documentation

## 2021-03-21 DIAGNOSIS — Z96653 Presence of artificial knee joint, bilateral: Secondary | ICD-10-CM | POA: Insufficient documentation

## 2021-03-21 DIAGNOSIS — I10 Essential (primary) hypertension: Secondary | ICD-10-CM | POA: Insufficient documentation

## 2021-03-21 DIAGNOSIS — R079 Chest pain, unspecified: Secondary | ICD-10-CM | POA: Diagnosis not present

## 2021-03-21 DIAGNOSIS — R0789 Other chest pain: Secondary | ICD-10-CM | POA: Diagnosis not present

## 2021-03-21 DIAGNOSIS — Z7982 Long term (current) use of aspirin: Secondary | ICD-10-CM | POA: Insufficient documentation

## 2021-03-21 DIAGNOSIS — R69 Illness, unspecified: Secondary | ICD-10-CM | POA: Diagnosis not present

## 2021-03-21 DIAGNOSIS — J3489 Other specified disorders of nose and nasal sinuses: Secondary | ICD-10-CM | POA: Diagnosis not present

## 2021-03-21 DIAGNOSIS — R29818 Other symptoms and signs involving the nervous system: Secondary | ICD-10-CM | POA: Diagnosis not present

## 2021-03-21 LAB — COMPREHENSIVE METABOLIC PANEL
ALT: 16 U/L (ref 0–44)
AST: 17 U/L (ref 15–41)
Albumin: 4 g/dL (ref 3.5–5.0)
Alkaline Phosphatase: 69 U/L (ref 38–126)
Anion gap: 11 (ref 5–15)
BUN: 10 mg/dL (ref 8–23)
CO2: 21 mmol/L — ABNORMAL LOW (ref 22–32)
Calcium: 9.2 mg/dL (ref 8.9–10.3)
Chloride: 104 mmol/L (ref 98–111)
Creatinine, Ser: 0.9 mg/dL (ref 0.44–1.00)
GFR, Estimated: >60 mL/min (ref >60)
Glucose, Bld: 121 mg/dL — ABNORMAL HIGH (ref 70–99)
Potassium: 3.7 mmol/L (ref 3.5–5.1)
Sodium: 136 mmol/L (ref 135–145)
Total Bilirubin: 0.6 mg/dL (ref 0.3–1.2)
Total Protein: 8.1 g/dL (ref 6.5–8.1)

## 2021-03-21 LAB — CBC WITH DIFFERENTIAL/PLATELET
Abs Immature Granulocytes: 0.04 10*3/uL (ref 0.00–0.07)
Basophils Absolute: 0.1 10*3/uL (ref 0.0–0.1)
Basophils Relative: 1 %
Eosinophils Absolute: 0.2 10*3/uL (ref 0.0–0.5)
Eosinophils Relative: 2 %
HCT: 38.3 % (ref 36.0–46.0)
Hemoglobin: 11.9 g/dL — ABNORMAL LOW (ref 12.0–15.0)
Immature Granulocytes: 0 %
Lymphocytes Relative: 24 %
Lymphs Abs: 2.5 10*3/uL (ref 0.7–4.0)
MCH: 27.4 pg (ref 26.0–34.0)
MCHC: 31.1 g/dL (ref 30.0–36.0)
MCV: 88 fL (ref 80.0–100.0)
Monocytes Absolute: 0.7 10*3/uL (ref 0.1–1.0)
Monocytes Relative: 7 %
Neutro Abs: 6.7 10*3/uL (ref 1.7–7.7)
Neutrophils Relative %: 66 %
Platelets: 329 10*3/uL (ref 150–400)
RBC: 4.35 MIL/uL (ref 3.87–5.11)
RDW: 14 % (ref 11.5–15.5)
WBC: 10.2 10*3/uL (ref 4.0–10.5)
nRBC: 0 % (ref 0.0–0.2)

## 2021-03-21 LAB — URINALYSIS, ROUTINE W REFLEX MICROSCOPIC
Bilirubin Urine: NEGATIVE
Glucose, UA: NEGATIVE mg/dL
Hgb urine dipstick: NEGATIVE
Ketones, ur: NEGATIVE mg/dL
Leukocytes,Ua: NEGATIVE
Nitrite: NEGATIVE
Protein, ur: NEGATIVE mg/dL
Specific Gravity, Urine: 1.005 (ref 1.005–1.030)
pH: 7 (ref 5.0–8.0)

## 2021-03-21 MED ORDER — SODIUM CHLORIDE 0.9 % IV BOLUS
500.0000 mL | Freq: Once | INTRAVENOUS | Status: AC
  Administered 2021-03-21: 500 mL via INTRAVENOUS

## 2021-03-21 MED ORDER — MECLIZINE HCL 25 MG PO TABS: 25.0000 mg | ORAL_TABLET | Freq: Three times a day (TID) | ORAL | 0 refills | Status: DC | PRN

## 2021-03-21 NOTE — ED Notes (Signed)
Pt to MRI

## 2021-03-21 NOTE — ED Provider Notes (Signed)
[facility] EMERGENCY DEPARTMENT Provider Note   CSN: [id number] Arrival date & time: 03/21/21  1048     History Chief Complaint  Patient presents with  . Dizziness    Megan Oneal is a 65 y.o. female.  Pt presents to the ED today with dizziness.  She said she has been diagnosed with hearing loss (otosclerosis).  She has just started wearing hearing aids.  She feels like she's been stressed due to the hearing loss.  She also said that she feels like her insides are burning.  She felt weak this morning.  Dizziness is better now.        Past Medical History:  Diagnosis Date  . Anxiety 03/12/2013  . Degenerative disc disease, lumbar    Epidural injections in 2007  . Diabetes mellitus without complication ([place])   . Endometriosis   . Frequent PVCs    12,000/24 hours  . Hypertension   . Otalgia, unspecified   . Palpitations 10/04/2010  . Vascular bruit 01/2013   Right neck    Patient Active Problem List   Diagnosis Date Noted  . Encounter for screening colonoscopy 02/15/2019  . Obesity 03/12/2013  . Anxiety 03/12/2013  . Laboratory test 03/12/2013  . Frequent PVCs   . Degenerative disc disease, lumbar   . Hypertension   . Endometriosis   . Vascular bruit 01/21/2013  . Palpitations 10/04/2010    Past Surgical History:  Procedure Laterality Date  . BREAST SURGERY     benign tumor excised-age 33  . CARDIOVASCULAR STRESS TEST  03/13/2008   No scintigraphic evidence of inducible myocardial ischemia, EKG negative for ischemia, no ECG changes.  . COLONOSCOPY  2009  . COLONOSCOPY N/A 09/21/2019   Procedure: COLONOSCOPY;  Surgeon: Rogene Houston, MD;  Location: [facility] ENDO SUITE;  Service: Endoscopy;  Laterality: N/A;  2:00-10:30  . CYST EXCISION  2007   Scalp  . DOPPLER ECHOCARDIOGRAPHY  03/13/2008   EF >52%, LV systolic function is normal, LV wall function is normal  . LAPAROSCOPIC CHOLECYSTECTOMY  2010  . LUMBAR LAMINECTOMY  2009    L4-L5 and L5-S1 laminectomy with  diskectomy; transforaminal  . POLYPECTOMY  09/21/2019   Procedure: POLYPECTOMY;  Surgeon: Rogene Houston, MD;  Location: [facility] ENDO SUITE;  Service: Endoscopy;;  . REPLACEMENT TOTAL KNEE    . SLEEP STUDY  10/29/2009   AHI-1.8/hr, during REM 6.4/hr; RDI-8.0/hr, during REM 15.0/hr; avg oxygen during REM and NREM was 94%  . TOTAL KNEE ARTHROPLASTY Bilateral   . TUBAL LIGATION       OB History    Gravida  3   Para  3   Term      Preterm      AB      Living  3     SAB      IAB      Ectopic      Multiple      Live Births              Family History  Problem Relation Age of Onset  . Diabetes Mother   . Hypertension Mother   . Diabetes Brother   . Hypertension Brother   . Hypertension Father     Social History   Tobacco Use  . Smoking status: Former Smoker    Packs/day: 2.00    Years: 20.00    Pack years: 40.00    Types: Cigarettes    Start date: 08/03/1972    Quit date: 08/03/1996  Years since quitting: 24.6  . Smokeless tobacco: Former Systems developer    Quit date: 12/21/1994  Substance Use Topics  . Alcohol use: No  . Drug use: No    Home Medications Prior to Admission medications   Medication Sig Start Date End Date Taking? Authorizing Provider  aspirin 81 MG tablet Take 81 mg by mouth daily.   Yes [provider]  cholecalciferol (VITAMIN D3) 25 MCG (1000 UT) tablet Take 1,000 Units by mouth daily.   Yes [provider]  dexlansoprazole (DEXILANT) 60 MG capsule Take 60 mg by mouth daily.   Yes [provider]  diltiazem (CARDIZEM) 30 MG tablet TAKE 1 TABLET (30 MG TOTAL) BY MOUTH 2 (TWO) TIMES DAILY AS NEEDED (PALPITATIONS). 10/03/20  Yes BranchAlphonse Guild, MD  escitalopram (LEXAPRO) 10 MG tablet Take 10 mg by mouth daily.   Yes [provider]  famotidine (PEPCID) 20 MG tablet Take 20 mg by mouth at bedtime. 01/07/20  Yes [provider]  ferrous sulfate 325 (65 FE) MG tablet Take 325 mg by mouth daily with  breakfast.    Yes [provider]  fluticasone (FLONASE) 50 MCG/ACT nasal spray Place 2 sprays into both nostrils daily as needed for allergies.  07/24/19  Yes [provider]  furosemide (LASIX) 40 MG tablet Take 40 mg by mouth every other day.    Yes [provider]  HYDROcodone-acetaminophen (NORCO/VICODIN) 5-325 MG per tablet Take 1 tablet by mouth every 4 (four) hours as needed for moderate pain or severe pain.  11/23/14  Yes [provider]  levocetirizine (XYZAL) 5 MG tablet Take 1 tablet by mouth every evening.  07/24/19  Yes [provider]  losartan (COZAAR) 100 MG tablet Take 100 mg by mouth daily.   Yes [provider]  meclizine (ANTIVERT) 25 MG tablet Take 1 tablet (25 mg total) by mouth 3 (three) times daily as needed for dizziness. 03/21/21  Yes Isla Pence, MD  megestrol (MEGACE) 20 MG tablet TAKE 1 TABLET BY MOUTH EVERY DAY 12/29/18  Yes Florian Buff, MD  meloxicam (MOBIC) 15 MG tablet Take 15 mg by mouth daily as needed. 02/05/20  Yes [provider]  polyethylene glycol powder (GLYCOLAX/MIRALAX) powder 1 scoop daily or as needed 06/07/18  Yes Eure, Mertie Clause, MD  PREMARIN vaginal cream PLACE 1 APPLICATORFUL VAGINALLY DAILY. 12/30/20  Yes Florian Buff, MD  rosuvastatin (CRESTOR) 5 MG tablet Take 5 mg by mouth daily. 09/28/20  Yes [provider]  verapamil (VERELAN PM) 240 MG 24 hr capsule TAKE 1 CAPSULE BY MOUTH TWICE A DAY 12/02/18  Yes Branch, Alphonse Guild, MD  vitamin C (ASCORBIC ACID) 500 MG tablet Take 500 mg by mouth daily.   Yes [provider]    Allergies    Metoprolol and Sulfa antibiotics  Review of Systems   Review of Systems  Neurological: Positive for dizziness and weakness.  All other systems reviewed and are negative.   Physical Exam Updated Vital Signs BP 135/74   Pulse 93   Temp 98.6 F (37 C) (Oral)   Resp 18   Ht 5\' 6"  (1.676 m)   Wt (!) 143.8 kg   SpO2 97%   BMI 51.17  kg/m   Physical Exam Vitals and nursing note reviewed.  Constitutional:      Appearance: Normal appearance. She is obese.  HENT:     Head: Normocephalic and atraumatic.     Right Ear: External ear normal.  Left Ear: External ear normal.     Nose: Nose normal.     Mouth/Throat:     Mouth: Mucous membranes are moist.     Pharynx: Oropharynx is clear.  Eyes:     Extraocular Movements: Extraocular movements intact.     Conjunctiva/sclera: Conjunctivae normal.     Pupils: Pupils are equal, round, and reactive to light.  Cardiovascular:     Rate and Rhythm: Normal rate and regular rhythm.     Pulses: Normal pulses.     Heart sounds: Normal heart sounds.  Pulmonary:     Effort: Pulmonary effort is normal.     Breath sounds: Normal breath sounds.  Abdominal:     General: Abdomen is flat. Bowel sounds are normal.     Palpations: Abdomen is soft.  Musculoskeletal:        General: Normal range of motion.     Cervical back: Normal range of motion and neck supple.  Skin:    General: Skin is warm.     Capillary Refill: Capillary refill takes less than 2 seconds.  Neurological:     General: No focal deficit present.     Mental Status: She is alert and oriented to person, place, and time.  Psychiatric:        Mood and Affect: Mood normal.        Behavior: Behavior normal.        Thought Content: Thought content normal.        Judgment: Judgment normal.     ED Results / Procedures / Treatments   Labs (all labs ordered are listed, but only abnormal results are displayed) Labs Reviewed  COMPREHENSIVE METABOLIC PANEL - Abnormal; Notable for the following components:      Result Value   CO2 21 (*)    Glucose, Bld 121 (*)    All other components within normal limits  CBC WITH DIFFERENTIAL/PLATELET - Abnormal; Notable for the following components:   Hemoglobin 11.9 (*)    All other components within normal limits  URINALYSIS, ROUTINE W REFLEX MICROSCOPIC - Abnormal; Notable for  the following components:   Color, Urine STRAW (*)    All other components within normal limits    EKG EKG Interpretation  Date/Time:  Friday March 21 2021 11:28:13 EDT Ventricular Rate:  102 PR Interval:  193 QRS Duration: 82 QT Interval:  393 QTC Calculation: 512 R Axis:   75 Text Interpretation: Sinus tachycardia Borderline repolarization abnormality ST elevation, consider inferior injury Prolonged QT interval No significant change since last tracing Confirmed by Isla Pence [phone]) on 03/21/2021 11:45:09 AM   Radiology CT Head Wo Contrast  Result Date: 03/21/2021 CLINICAL DATA:  Neuro deficit, acute, stroke suspected. Additional history provided: Patient reports dizziness EXAM: CT HEAD WITHOUT CONTRAST TECHNIQUE: Contiguous axial images were obtained from the base of the skull through the vertex without intravenous contrast. COMPARISON:  No pertinent prior exams available for comparison. FINDINGS: Brain: Cerebral volume is normal. There is no acute intracranial hemorrhage. No demarcated cortical infarct. No extra-axial fluid collection. No evidence of intracranial mass. No midline shift. Vascular: No hyperdense vessel.  Atherosclerotic calcifications. Skull: Normal. Negative for fracture or focal lesion. Sinuses/Orbits: Visualized orbits show no acute finding. No significant paranasal sinus disease at the imaged levels. Other: Congenital nonunion of the posterior arch of C1. IMPRESSION: Unremarkable non-contrast CT appearance of the brain. No evidence of acute intracranial abnormality. Electronically Signed   By: Kellie Simmering DO   On: 03/21/2021 12:38  MR BRAIN WO CONTRAST  Result Date: 03/21/2021 CLINICAL DATA:  Dizzy EXAM: MRI HEAD WITHOUT CONTRAST TECHNIQUE: Multiplanar, multiecho pulse sequences of the brain and surrounding structures were obtained without intravenous contrast. COMPARISON:  None. FINDINGS: Brain: There is no acute infarction or intracranial hemorrhage. There is no  intracranial mass, mass effect, or edema. There is no hydrocephalus or extra-axial fluid collection. Ventricles and sulci are normal in size and configuration. Few punctate foci of T2 hyperintensity are present in the supratentorial white matter and may reflect minor chronic microvascular ischemic changes or gliosis/demyelination of other etiologies. Vascular: Major vessel flow voids at the skull base are preserved. Skull and upper cervical spine: Normal marrow signal is preserved. Sinuses/Orbits: Trace mucosal thickening.  Orbits are unremarkable. Other: Sella is unremarkable.  Mastoid air cells are clear. IMPRESSION: No evidence of recent infarction, hemorrhage, or mass. Probable minor chronic microvascular ischemic changes. Electronically Signed   By: Macy Mis M.D.   On: 03/21/2021 14:53    Procedures Procedures   Medications Ordered in ED Medications  sodium chloride 0.9 % bolus 500 mL (0 mLs Intravenous Stopped 03/21/21 1226)    ED Course  I have reviewed the triage vital signs and the nursing notes.  Pertinent labs & imaging results that were available during my care of the patient were reviewed by me and considered in my medical decision making (see chart for details).    MDM Rules/Calculators/A&P                          Pt is feeling much better.  She had nothing acute on her MRI.  Pt is to return if worse.  F/u with pcp or with Dr. Benjamine Mola.    Final Clinical Impression(s) / ED Diagnoses Final diagnoses:  Vertigo    Rx / DC Orders ED Discharge Orders         Ordered    meclizine (ANTIVERT) 25 MG tablet  3 times daily PRN        03/21/21 1502           Isla Pence, MD 03/21/21 1504

## 2021-03-21 NOTE — ED Triage Notes (Signed)
States she is dizzy and feels like she is burning all aver, denies pain, also was recently diagnosed with some type of ear problem and requires hearing aides

## 2021-03-22 ENCOUNTER — Encounter: Payer: Self-pay | Admitting: Emergency Medicine

## 2021-03-22 ENCOUNTER — Emergency Department: Payer: Medicare HMO

## 2021-03-22 ENCOUNTER — Other Ambulatory Visit: Payer: Self-pay

## 2021-03-22 ENCOUNTER — Emergency Department (HOSPITAL_BASED_OUTPATIENT_CLINIC_OR_DEPARTMENT_OTHER)
Admission: EM | Admit: 2021-03-22 | Discharge: 2021-03-24 | Disposition: A | Discharge status: Home / Self Care | Payer: Medicare HMO | Source: Home / Self Care | Attending: Student in an Organized Health Care Education/Training Program | Admitting: Student in an Organized Health Care Education/Training Program

## 2021-03-22 DIAGNOSIS — R440 Auditory hallucinations: Secondary | ICD-10-CM | POA: Insufficient documentation

## 2021-03-22 DIAGNOSIS — Z79899 Other long term (current) drug therapy: Secondary | ICD-10-CM | POA: Insufficient documentation

## 2021-03-22 DIAGNOSIS — M5136 Other intervertebral disc degeneration, lumbar region: Secondary | ICD-10-CM | POA: Diagnosis present

## 2021-03-22 DIAGNOSIS — E669 Obesity, unspecified: Secondary | ICD-10-CM | POA: Diagnosis present

## 2021-03-22 DIAGNOSIS — Z7982 Long term (current) use of aspirin: Secondary | ICD-10-CM | POA: Insufficient documentation

## 2021-03-22 DIAGNOSIS — I10 Essential (primary) hypertension: Secondary | ICD-10-CM | POA: Diagnosis present

## 2021-03-22 DIAGNOSIS — Z87891 Personal history of nicotine dependence: Secondary | ICD-10-CM | POA: Insufficient documentation

## 2021-03-22 DIAGNOSIS — R000 Tachycardia, unspecified: Secondary | ICD-10-CM | POA: Diagnosis not present

## 2021-03-22 DIAGNOSIS — Z4682 Encounter for fitting and adjustment of non-vascular catheter: Secondary | ICD-10-CM | POA: Diagnosis not present

## 2021-03-22 DIAGNOSIS — Z96653 Presence of artificial knee joint, bilateral: Secondary | ICD-10-CM | POA: Insufficient documentation

## 2021-03-22 DIAGNOSIS — G9340 Encephalopathy, unspecified: Secondary | ICD-10-CM | POA: Diagnosis not present

## 2021-03-22 DIAGNOSIS — I517 Cardiomegaly: Secondary | ICD-10-CM | POA: Diagnosis not present

## 2021-03-22 DIAGNOSIS — E119 Type 2 diabetes mellitus without complications: Secondary | ICD-10-CM | POA: Insufficient documentation

## 2021-03-22 DIAGNOSIS — J9811 Atelectasis: Secondary | ICD-10-CM | POA: Diagnosis not present

## 2021-03-22 DIAGNOSIS — T481X1A Poisoning by skeletal muscle relaxants [neuromuscular blocking agents], accidental (unintentional), initial encounter: Secondary | ICD-10-CM | POA: Diagnosis not present

## 2021-03-22 DIAGNOSIS — F419 Anxiety disorder, unspecified: Secondary | ICD-10-CM | POA: Diagnosis present

## 2021-03-22 DIAGNOSIS — F22 Delusional disorders: Secondary | ICD-10-CM

## 2021-03-22 DIAGNOSIS — Z20822 Contact with and (suspected) exposure to covid-19: Secondary | ICD-10-CM | POA: Insufficient documentation

## 2021-03-22 DIAGNOSIS — R0789 Other chest pain: Secondary | ICD-10-CM | POA: Diagnosis not present

## 2021-03-22 DIAGNOSIS — Z981 Arthrodesis status: Secondary | ICD-10-CM | POA: Diagnosis not present

## 2021-03-22 DIAGNOSIS — J811 Chronic pulmonary edema: Secondary | ICD-10-CM | POA: Diagnosis not present

## 2021-03-22 DIAGNOSIS — Z9049 Acquired absence of other specified parts of digestive tract: Secondary | ICD-10-CM | POA: Diagnosis not present

## 2021-03-22 DIAGNOSIS — R002 Palpitations: Secondary | ICD-10-CM | POA: Diagnosis present

## 2021-03-22 DIAGNOSIS — R69 Illness, unspecified: Secondary | ICD-10-CM | POA: Diagnosis not present

## 2021-03-22 DIAGNOSIS — H8090 Unspecified otosclerosis, unspecified ear: Secondary | ICD-10-CM

## 2021-03-22 DIAGNOSIS — R4182 Altered mental status, unspecified: Secondary | ICD-10-CM | POA: Diagnosis not present

## 2021-03-22 LAB — COMPREHENSIVE METABOLIC PANEL
ALT: 14 U/L (ref 0–44)
AST: 19 U/L (ref 15–41)
Albumin: 3.9 g/dL (ref 3.5–5.0)
Alkaline Phosphatase: 61 U/L (ref 38–126)
Anion gap: 10 (ref 5–15)
BUN: 7 mg/dL — ABNORMAL LOW (ref 8–23)
CO2: 23 mmol/L (ref 22–32)
Calcium: 9.2 mg/dL (ref 8.9–10.3)
Chloride: 106 mmol/L (ref 98–111)
Creatinine, Ser: 0.94 mg/dL (ref 0.44–1.00)
GFR, Estimated: >60 mL/min (ref >60)
Glucose, Bld: 140 mg/dL — ABNORMAL HIGH (ref 70–99)
Potassium: 3.2 mmol/L — ABNORMAL LOW (ref 3.5–5.1)
Sodium: 139 mmol/L (ref 135–145)
Total Bilirubin: 0.8 mg/dL (ref 0.3–1.2)
Total Protein: 7.8 g/dL (ref 6.5–8.1)

## 2021-03-22 LAB — CBC
HCT: 37.4 % (ref 36.0–46.0)
Hemoglobin: 11.9 g/dL — ABNORMAL LOW (ref 12.0–15.0)
MCH: 27.4 pg (ref 26.0–34.0)
MCHC: 31.8 g/dL (ref 30.0–36.0)
MCV: 86.2 fL (ref 80.0–100.0)
Platelets: 307 10*3/uL (ref 150–400)
RBC: 4.34 MIL/uL (ref 3.87–5.11)
RDW: 14.1 % (ref 11.5–15.5)
WBC: 11.6 10*3/uL — ABNORMAL HIGH (ref 4.0–10.5)
nRBC: 0 % (ref 0.0–0.2)

## 2021-03-22 LAB — TROPONIN I (HIGH SENSITIVITY)
Troponin I (High Sensitivity): 4 ng/L (ref <18)
Troponin I (High Sensitivity): 5 ng/L (ref <18)

## 2021-03-22 MED ORDER — ASPIRIN EC 81 MG PO TBEC
81.0000 mg | DELAYED_RELEASE_TABLET | Freq: Every day | ORAL | Status: DC
  Administered 2021-03-23 – 2021-03-24 (×2): 81 mg via ORAL
  Filled 2021-03-22 (×2): qty 1

## 2021-03-22 MED ORDER — FUROSEMIDE 40 MG PO TABS
40.0000 mg | ORAL_TABLET | ORAL | Status: DC
  Administered 2021-03-23: 40 mg via ORAL
  Filled 2021-03-22: qty 1

## 2021-03-22 MED ORDER — DILTIAZEM HCL 60 MG PO TABS
30.0000 mg | ORAL_TABLET | Freq: Once | ORAL | Status: AC
  Administered 2021-03-22: 30 mg via ORAL
  Filled 2021-03-22: qty 1

## 2021-03-22 MED ORDER — ROSUVASTATIN CALCIUM 5 MG PO TABS
5.0000 mg | ORAL_TABLET | Freq: Every day | ORAL | Status: DC
Start: 2021-03-23 — End: 2021-03-24
  Administered 2021-03-23 – 2021-03-24 (×2): 5 mg via ORAL
  Filled 2021-03-22 (×3): qty 1

## 2021-03-22 MED ORDER — FAMOTIDINE 20 MG PO TABS
20.0000 mg | ORAL_TABLET | Freq: Every day | ORAL | Status: DC
Start: 2021-03-22 — End: 2021-03-24
  Administered 2021-03-23 (×2): 20 mg via ORAL
  Filled 2021-03-22 (×2): qty 1

## 2021-03-22 MED ORDER — MEGESTROL ACETATE 20 MG PO TABS
20.0000 mg | ORAL_TABLET | Freq: Every day | ORAL | Status: DC
Start: 2021-03-23 — End: 2021-03-24
  Administered 2021-03-23 – 2021-03-24 (×2): 20 mg via ORAL
  Filled 2021-03-22 (×3): qty 1

## 2021-03-22 MED ORDER — DILTIAZEM HCL 30 MG PO TABS
30.0000 mg | ORAL_TABLET | Freq: Two times a day (BID) | ORAL | Status: DC | PRN
Start: 2021-03-22 — End: 2021-03-24
  Administered 2021-03-23 (×2): 30 mg via ORAL
  Filled 2021-03-22 (×3): qty 1

## 2021-03-22 MED ORDER — OLANZAPINE 5 MG PO TABS
5.0000 mg | ORAL_TABLET | Freq: Every day | ORAL | Status: DC
  Administered 2021-03-22 – 2021-03-23 (×2): 5 mg via ORAL
  Filled 2021-03-22 (×2): qty 1

## 2021-03-22 MED ORDER — LOSARTAN POTASSIUM 50 MG PO TABS
100.0000 mg | ORAL_TABLET | Freq: Every day | ORAL | Status: DC
  Administered 2021-03-23 – 2021-03-24 (×2): 100 mg via ORAL
  Filled 2021-03-22 (×3): qty 2

## 2021-03-22 MED ORDER — LORAZEPAM 1 MG PO TABS
1.0000 mg | ORAL_TABLET | Freq: Once | ORAL | Status: AC
  Administered 2021-03-22: 1 mg via ORAL
  Filled 2021-03-22: qty 1

## 2021-03-22 MED ORDER — PANTOPRAZOLE SODIUM 40 MG PO TBEC
40.0000 mg | DELAYED_RELEASE_TABLET | Freq: Every day | ORAL | Status: DC
  Administered 2021-03-23 – 2021-03-24 (×2): 40 mg via ORAL
  Filled 2021-03-22 (×2): qty 1

## 2021-03-22 MED ORDER — VERAPAMIL HCL ER 240 MG PO TBCR
240.0000 mg | EXTENDED_RELEASE_TABLET | Freq: Every day | ORAL | Status: DC
  Administered 2021-03-22 – 2021-03-24 (×3): 240 mg via ORAL
  Filled 2021-03-22 (×4): qty 1
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
## 2021-03-22 NOTE — ED Notes (Signed)
Spoke with [facility] on the phone regarding patient placement. Will update once reviewing pt chart.

## 2021-03-22 NOTE — ED Notes (Signed)
MD at bedside. Sister said patient is very paranoid as well and she isnt sleeping. Went to ER yesterday.

## 2021-03-22 NOTE — ED Notes (Signed)
Spoke with TTS, they will be up shortly to see pt

## 2021-03-22 NOTE — ED Notes (Signed)
Pt called out using call bell at this time. Pt stated "I feel like something's eating my brain" When asked for more information regarding this feeling, pt stated "It's like a dark crunching sound" MD Quentin Cornwall made aware at this time.

## 2021-03-22 NOTE — ED Triage Notes (Signed)
Pt to ER states she is hearing voices, music, thumping in her head.  Pt states diagnosed with "that ear disease and since I can't hear no more seems like I hear every kind of noise in my head".  Pt states voices whisper and she can not hear what they are saying.  Pt denies SI/HI.

## 2021-03-22 NOTE — BH Assessment (Signed)
Referral information for Psychiatric Hospitalization faxed to;   [facility] [phone]),   . [facility] [phone]- [phone]) [phone]  . [place] [phone]),  . [facility] [phone]),   . [place] [phone] or [phone]),   . [facility] [phone]).

## 2021-03-22 NOTE — ED Notes (Signed)
Pt begins c/o burning and squeezing in her center and left chest. States burning in her stomach. MD made aware.

## 2021-03-22 NOTE — ED Notes (Signed)
Sister at bedside Charlann Noss [phone] Sister advised the patient has been hearing noises and voices in her home and call her to come check it out. This has been going on for awhile and they want a psych consult.

## 2021-03-22 NOTE — ED Notes (Signed)
Patient to room. Patient is crying and states "please help me. I am tired of hearing these voices and feeling this crazy wind sensation blow across my face and neck. I have been hearing satanic music too". Patient is CAOx4, in so signs of immediate distress. She advised this has been going on for 2 months. She had a CT at [facility] yesterday.

## 2021-03-22 NOTE — ED Notes (Signed)
Pt continues to be tachy around 115 on cardiac monitor. Pt states her "heart is hurting" and she "can feel it palpitating." MD made aware, PO ativan ordered.

## 2021-03-22 NOTE — ED Notes (Signed)
Pharmacy called to reconcile home meds. Pharmacy stated they will send a tech up per provider request.

## 2021-03-22 NOTE — ED Provider Notes (Signed)
[facility] Emergency Department Provider Note    Event Date/Time   First MD Initiated Contact with Patient 03/22/21 1502     (approximate)  I have reviewed the triage vital signs and the nursing notes.   HISTORY  Chief Complaint Hearing Problem    HPI Megan Oneal is a 65 y.o. female below listed past medical history presents to the ER for about 2 months of hearing increasing voices whispers and loud noises.  States that she feels like she will hear the sounds of a tractor and exits appreciating the loud.  Patient is here with her sister states that the patient is becoming increasingly paranoid.  Denies any SI or HI.  Denies any command hallucinations.  Many months ago she was on Escitalopram but stopped taking those that she felt like it was not helping her at all she is still feeling anxious.  States that 2 months ago she was diagnosed with hearing loss feels like the symptoms have worsened over the past 2 months since that diagnosis.  Denies not any numbness or tingling.  Denies any lightheadedness.  No chest pain or pressure.  No abdominal pain.  Was just seen yesterday for dizziness with reassuring work-up.    Past Medical History:  Diagnosis Date  . Anxiety 03/12/2013  . Degenerative disc disease, lumbar    Epidural injections in 2007  . Diabetes mellitus without complication ([place])   . Endometriosis   . Frequent PVCs    12,000/24 hours  . Hypertension   . Otalgia, unspecified   . Palpitations 10/04/2010  . Vascular bruit 01/2013   Right neck   Family History  Problem Relation Age of Onset  . Diabetes Mother   . Hypertension Mother   . Diabetes Brother   . Hypertension Brother   . Hypertension Father    Past Surgical History:  Procedure Laterality Date  . BREAST SURGERY     benign tumor excised-age 48  . CARDIOVASCULAR STRESS TEST  03/13/2008   No scintigraphic evidence of inducible myocardial ischemia, EKG negative for ischemia, no ECG  changes.  . COLONOSCOPY  2009  . COLONOSCOPY N/A 09/21/2019   Procedure: COLONOSCOPY;  Surgeon: Rogene Houston, MD;  Location: [facility];  Service: Endoscopy;  Laterality: N/A;  2:00-10:30  . CYST EXCISION  2007   Scalp  . DOPPLER ECHOCARDIOGRAPHY  03/13/2008   EF >93%, LV systolic function is normal, LV wall function is normal  . LAPAROSCOPIC CHOLECYSTECTOMY  2010  . LUMBAR LAMINECTOMY  2009    L4-L5 and L5-S1 laminectomy with diskectomy; transforaminal  . POLYPECTOMY  09/21/2019   Procedure: POLYPECTOMY;  Surgeon: Rogene Houston, MD;  Location: [facility];  Service: Endoscopy;;  . REPLACEMENT TOTAL KNEE    . SLEEP STUDY  10/29/2009   AHI-1.8/hr, during REM 6.4/hr; RDI-8.0/hr, during REM 15.0/hr; avg oxygen during REM and NREM was 94%  . TOTAL KNEE ARTHROPLASTY Bilateral   . TUBAL LIGATION     Patient Active Problem List   Diagnosis Date Noted  . Encounter for screening colonoscopy 02/15/2019  . Obesity 03/12/2013  . Anxiety 03/12/2013  . Laboratory test 03/12/2013  . Frequent PVCs   . Degenerative disc disease, lumbar   . Hypertension   . Endometriosis   . Vascular bruit 01/21/2013  . Palpitations 10/04/2010      Prior to Admission medications   Medication Sig Start Date End Date Taking? Authorizing Provider  aspirin 81 MG tablet Take 81 mg  by mouth daily.    [provider]  cholecalciferol (VITAMIN D3) 25 MCG (1000 UT) tablet Take 1,000 Units by mouth daily.    [provider]  dexlansoprazole (DEXILANT) 60 MG capsule Take 60 mg by mouth daily.    [provider]  diltiazem (CARDIZEM) 30 MG tablet TAKE 1 TABLET (30 MG TOTAL) BY MOUTH 2 (TWO) TIMES DAILY AS NEEDED (PALPITATIONS). 10/03/20   Arnoldo Lenis, MD  escitalopram (LEXAPRO) 10 MG tablet Take 10 mg by mouth daily.    [provider]  famotidine (PEPCID) 20 MG tablet Take 20 mg by mouth at bedtime. 01/07/20   [provider]  ferrous sulfate 325 (65 FE) MG  tablet Take 325 mg by mouth daily with breakfast.     [provider]  fluticasone (FLONASE) 50 MCG/ACT nasal spray Place 2 sprays into both nostrils daily as needed for allergies.  07/24/19   [provider]  furosemide (LASIX) 40 MG tablet Take 40 mg by mouth every other day.     [provider]  HYDROcodone-acetaminophen (NORCO/VICODIN) 5-325 MG per tablet Take 1 tablet by mouth every 4 (four) hours as needed for moderate pain or severe pain.  11/23/14   [provider]  levocetirizine (XYZAL) 5 MG tablet Take 1 tablet by mouth every evening.  07/24/19   [provider]  losartan (COZAAR) 100 MG tablet Take 100 mg by mouth daily.    [provider]  meclizine (ANTIVERT) 25 MG tablet Take 1 tablet (25 mg total) by mouth 3 (three) times daily as needed for dizziness. 03/21/21   Isla Pence, MD  megestrol (MEGACE) 20 MG tablet TAKE 1 TABLET BY MOUTH EVERY DAY 12/29/18   Florian Buff, MD  meloxicam (MOBIC) 15 MG tablet Take 15 mg by mouth daily as needed. 02/05/20   [provider]  polyethylene glycol powder (GLYCOLAX/MIRALAX) powder 1 scoop daily or as needed 06/07/18   Florian Buff, MD  PREMARIN vaginal cream PLACE 1 APPLICATORFUL VAGINALLY DAILY. 12/30/20   Florian Buff, MD  rosuvastatin (CRESTOR) 5 MG tablet Take 5 mg by mouth daily. 09/28/20   [provider]  verapamil (VERELAN PM) 240 MG 24 hr capsule TAKE 1 CAPSULE BY MOUTH TWICE A DAY 12/02/18   Arnoldo Lenis, MD  vitamin C (ASCORBIC ACID) 500 MG tablet Take 500 mg by mouth daily.    [provider]    Allergies Metoprolol and Sulfa antibiotics    Social History Social History   Tobacco Use  . Smoking status: Former Smoker    Packs/day: 2.00    Years: 20.00    Pack years: 40.00    Types: Cigarettes    Start date: 08/03/1972    Quit date: 08/03/1996    Years since quitting: 24.6  . Smokeless tobacco: Former Systems developer    Quit date: 12/21/1994   Substance Use Topics  . Alcohol use: No  . Drug use: No    Review of Systems Patient denies headaches, rhinorrhea, blurry vision, numbness, shortness of breath, chest pain, edema, cough, abdominal pain, nausea, vomiting, diarrhea, dysuria, fevers, rashes or hallucinations unless otherwise stated above in HPI. ____________________________________________   PHYSICAL EXAM:  VITAL SIGNS: Vitals:   03/22/21 2100 03/22/21 2200  BP: (!) 169/78 (!) 168/65  Pulse: (!) 102 (!) 104  Resp: (!) 25 (!) 22  Temp:    SpO2: 99% 100%    Constitutional: Alert and oriented.  Eyes: Conjunctivae are normal.  Head:  Atraumatic. Nose: No congestion/rhinnorhea. Mouth/Throat: Mucous membranes are moist.   Neck: No stridor. Painless ROM.  Cardiovascular: Normal rate, regular rhythm. Grossly normal heart sounds.  Good peripheral circulation. Respiratory: Normal respiratory effort.  No retractions. Lungs CTAB. Gastrointestinal: Soft and nontender. No distention. No abdominal bruits. No CVA tenderness. Genitourinary:  Musculoskeletal: No lower extremity tenderness nor edema.  No joint effusions. Neurologic:  Normal speech and language. No gross focal neurologic deficits are appreciated. No facial droop Skin:  Skin is warm, dry and intact. No rash noted. Psychiatric: Mood and affect are normal. Calm and cooperative  ____________________________________________   LABS (all labs ordered are listed, but only abnormal results are displayed)  Results for orders placed or performed during the hospital encounter of 03/22/21 (from the past 24 hour(s))  Troponin I (High Sensitivity)     Status: None   Collection Time: 03/22/21  6:16 PM  Result Value Ref Range   Troponin I (High Sensitivity) 4 <18 ng/L  CBC     Status: Abnormal   Collection Time: 03/22/21  6:16 PM  Result Value Ref Range   WBC 11.6 (H) 4.0 - 10.5 K/uL   RBC 4.34 3.87 - 5.11 MIL/uL   Hemoglobin 11.9 (L) 12.0 - 15.0 g/dL   HCT 37.4 36.0 -  46.0 %   MCV 86.2 80.0 - 100.0 fL   MCH 27.4 26.0 - 34.0 pg   MCHC 31.8 30.0 - 36.0 g/dL   RDW 14.1 11.5 - 15.5 %   Platelets 307 150 - 400 K/uL   nRBC 0.0 0.0 - 0.2 %  Comprehensive metabolic panel     Status: Abnormal   Collection Time: 03/22/21  6:16 PM  Result Value Ref Range   Sodium 139 135 - 145 mmol/L   Potassium 3.2 (L) 3.5 - 5.1 mmol/L   Chloride 106 98 - 111 mmol/L   CO2 23 22 - 32 mmol/L   Glucose, Bld 140 (H) 70 - 99 mg/dL   BUN 7 (L) 8 - 23 mg/dL   Creatinine, Ser 0.94 0.44 - 1.00 mg/dL   Calcium 9.2 8.9 - 10.3 mg/dL   Total Protein 7.8 6.5 - 8.1 g/dL   Albumin 3.9 3.5 - 5.0 g/dL   AST 19 15 - 41 U/L   ALT 14 0 - 44 U/L   Alkaline Phosphatase 61 38 - 126 U/L   Total Bilirubin 0.8 0.3 - 1.2 mg/dL   GFR, Estimated >60 >60 mL/min   Anion gap 10 5 - 15  Troponin I (High Sensitivity)     Status: None   Collection Time: 03/22/21  7:54 PM  Result Value Ref Range   Troponin I (High Sensitivity) 5 <18 ng/L   ____________________________________________  EKG My review and personal interpretation at Time: 17:51   Indication: chest discomfort  Rate: 120  Rhythm: sinus Axis: normal Other: nonspecific st abn, no stemi ____________________________________________  RADIOLOGY  I personally reviewed all radiographic images ordered to evaluate for the above acute complaints and reviewed radiology reports and findings.  These findings were personally discussed with the patient.  Please see medical record for radiology report.  ____________________________________________   PROCEDURES  Procedure(s) performed:  Procedures    Critical Care performed: no ____________________________________________   INITIAL IMPRESSION / ASSESSMENT AND PLAN / ED COURSE  Pertinent labs & imaging results that were available during my care of the patient were reviewed by me and considered in my medical decision making (see chart for details).   DDX: Psychosis, delirium, medication  effect, noncompliance, polysubstance abuse, Si, Hi, depression   PA TENNANT is a 65 y.o. who presents to the ED with symptoms as described above presenting to the ER with what appears to be primary psychiatric issue.  Had blood work done yesterday had any pain extensive work-up which was reassuring.  Presenting with signs of hearing voices and increasing paranoia.  Will consult psychiatry.  Clinical Course as of 03/22/21 2337  Sat Mar 22, 2021  1756 Patient mention the nurse she is feeling some chest tightness and palpitations.  Has a history of the same.  Will order her Cardizem.  EKG with appears to be sinus tachycardia she is not hyper toxic not significantly hypertensive.  Still awaiting psychiatric consultation will add on troponin basic blood work. [PR]  2051 Trope negative.  EKG nonischemic.  Still having some mild sinus tachycardia but improving after Cardizem.  Blood pressure improved after Ativan.  Suspect patient feeling very anxious likely contributing to presentation.  Will continue observe. [PR]  2119 Repeat troponin negative.  Patient now being evaluated by psychiatry. [PR]    Clinical Course User Index [PR] Merlyn Lot, MD   The patient has been placed in psychiatric observation due to the need to provide a safe environment for the patient while obtaining psychiatric consultation and evaluation, as well as ongoing medical and medication management to treat the patient's condition.  The patient has not been placed under full IVC at this time.   The patient was evaluated in Emergency Department today for the symptoms described in the history of present illness. He/she was evaluated in the context of the global COVID-19 pandemic, which necessitated consideration that the patient might be at risk for infection with the SARS-CoV-2 virus that causes COVID-19. Institutional protocols and algorithms that pertain to the evaluation of patients at risk for COVID-19 are in a state of  rapid change based on information released by regulatory bodies including the CDC and federal and state organizations. These policies and algorithms were followed during the patient's care in the ED.  As part of my medical decision making, I reviewed the following data within the [place] notes reviewed and incorporated, Labs reviewed, notes from prior ED visits and [place] Controlled Substance Database   ____________________________________________   FINAL CLINICAL IMPRESSION(S) / ED DIAGNOSES  Final diagnoses:  Paranoia ([place])      NEW MEDICATIONS STARTED DURING THIS VISIT:  New Prescriptions   No medications on file     Note:  This document was prepared using Dragon voice recognition software and may include unintentional dictation errors.    Merlyn Lot, MD 03/22/21 2337

## 2021-03-22 NOTE — Consult Note (Signed)
[facility] Consult   Reason for Consult: Dizziness Referring Physician:  Dr. Gilford Raid Patient Identification: Megan Oneal MRN:  [id number] Principal Diagnosis: <principal problem not specified> Diagnosis:  Active Problems:   Hypertension   Degenerative disc disease, lumbar   Palpitations   Obesity   Anxiety   Total Time spent with patient: 30 minutes  Subjective:"I need help with the voice I hear and all the noise in my head." Megan Oneal is a 65 y.o. female patient presented to [facility] ED via POV voluntarily. Per the ED triage nurse note, Pt to ER states she is hearing voices, music, thumping in her head.  Pt states diagnosed with "that ear disease and since I can't hear no more seems like I hear every kind of noise in my head".  Pt states voices whisper and she can not hear what they are saying.  Pt denies SI/HI. The patient expressed, "I hear voices; I hear whispers, I can't understand what they are saying. I can feel it blowing cold air on me, and I can hear it on my neck. I hear it singing to. I can feel it trying to suck the life out of me on my left side. I hate to say it, but I felt today that it was trying to get intimate with me. I felt it go up under my skirt and try to touch me. I have the trauma of losing my hearing. I knew it would be bad, but not that bad. I can mix it with loud music, and it sounds like an old tractor cranking up. I've been hearing a [employer]; I can't think of the name, just how it sounds. It is the most awful sound. They told me I would hear sounds, but not like this. I have not slept since the day before yesterday. The patient was seen face-to-face by this provider; the chart was reviewed and consulted with Dr.Robinson on 03/22/2021 due to the patient's care. It was discussed with the EDP that the patient does meet the criteria to be admitted to the psychiatric inpatient unit due to complaints of hallucinations. On evaluation, the patient is  alert and oriented x 4, extremely anxious but cooperative, and mood-congruent with affect.  The patient does not appear to be responding to internal or external stimuli. The patient is presenting with some delusional thinking. The patient admits to auditory and visual hallucinations. The patient denies any suicidal, homicidal, or self-harm ideations. The patient is not presenting with any psychotic behaviors, but She voiced is experiencing paranoia behaviors. During an encounter with the patient, she was able to answer questions appropriately.    HPI: Per Dr. Gilford Raid,  Megan Oneal is a 65 y.o. female. Pt presents to the ED today with dizziness.  She said she has been diagnosed with hearing loss (otosclerosis).  She has just started wearing hearing aids.  She feels like she's been stressed due to the hearing loss.  She also said that she feels like her insides are burning.  She felt weak this morning.  Dizziness is better now.  Past Psychiatric History:  Anxiety  Risk to Self:  Yes Risk to Others:  No Prior Inpatient Therapy:  No Prior Outpatient Therapy:  No  Past Medical History:  Past Medical History:  Diagnosis Date  . Anxiety 03/12/2013  . Degenerative disc disease, lumbar    Epidural injections in 2007  . Diabetes mellitus without complication ([place])   . Endometriosis   . Frequent  PVCs    12,000/24 hours  . Hypertension   . Otalgia, unspecified   . Palpitations 10/04/2010  . Vascular bruit 01/2013   Right neck    Past Surgical History:  Procedure Laterality Date  . BREAST SURGERY     benign tumor excised-age 33  . CARDIOVASCULAR STRESS TEST  03/13/2008   No scintigraphic evidence of inducible myocardial ischemia, EKG negative for ischemia, no ECG changes.  . COLONOSCOPY  2009  . COLONOSCOPY N/A 09/21/2019   Procedure: COLONOSCOPY;  Surgeon: Rogene Houston, MD;  Location[facility];  Service: Endoscopy;  Laterality: N/A;  2:00-10:30  . CYST EXCISION  2007   Scalp  .  DOPPLER ECHOCARDIOGRAPHY  03/13/2008   EF >22%, LV systolic function is normal, LV wall function is normal  . LAPAROSCOPIC CHOLECYSTECTOMY  2010  . LUMBAR LAMINECTOMY  2009    L4-L5 and L5-S1 laminectomy with diskectomy; transforaminal  . POLYPECTOMY  09/21/2019   Procedure: POLYPECTOMY;  Surgeon: Rogene Houston, MD;  Location: [facility];  Service: Endoscopy;;  . REPLACEMENT TOTAL KNEE    . SLEEP STUDY  10/29/2009   AHI-1.8/hr, during REM 6.4/hr; RDI-8.0/hr, during REM 15.0/hr; avg oxygen during REM and NREM was 94%  . TOTAL KNEE ARTHROPLASTY Bilateral   . TUBAL LIGATION     Family History:  Family History  Problem Relation Age of Onset  . Diabetes Mother   . Hypertension Mother   . Diabetes Brother   . Hypertension Brother   . Hypertension Father    Family Psychiatric  History:  Social History:  Social History   Substance and Sexual Activity  Alcohol Use No     Social History   Substance and Sexual Activity  Drug Use No    Social History   Socioeconomic History  . Marital status: Divorced    Spouse name: Not on file  . Number of children: 3  . Years of education: Not on file  . Highest education level: Not on file  Occupational History  . Not on file  Tobacco Use  . Smoking status: Former Smoker    Packs/day: 2.00    Years: 20.00    Pack years: 40.00    Types: Cigarettes    Start date: 08/03/1972    Quit date: 08/03/1996    Years since quitting: 24.6  . Smokeless tobacco: Former Systems developer    Quit date: 12/21/1994  Substance and Sexual Activity  . Alcohol use: No  . Drug use: No  . Sexual activity: Not Currently    Birth control/protection: Post-menopausal  Other Topics Concern  . Not on file  Social History Narrative  . Not on file   Social Determinants of Health   Financial Resource Strain: Not on file  Food Insecurity: Not on file  Transportation Needs: Not on file  Physical Activity: Not on file  Stress: Not on file  Social Connections: Not on  file   Additional Social History:    Allergies:   Allergies  Allergen Reactions  . Metoprolol Other (See Comments)    Fatigue and malaise; wheezing  . Sulfa Antibiotics     Labs:  Results for orders placed or performed during the hospital encounter of 03/22/21 (from the past 48 hour(s))  Troponin I (High Sensitivity)     Status: None   Collection Time: 03/22/21  6:16 PM  Result Value Ref Range   Troponin I (High Sensitivity) 4 <18 ng/L    Comment: Performed at [facility],  [place], [place] [address]  CBC     Status: Abnormal   Collection Time: 03/22/21  6:16 PM  Result Value Ref Range   WBC 11.6 (H) 4.0 - 10.5 K/uL   RBC 4.34 3.87 - 5.11 MIL/uL   Hemoglobin 11.9 (L) 12.0 - 15.0 g/dL   HCT 37.4 36.0 - 46.0 %   MCV 86.2 80.0 - 100.0 fL   MCH 27.4 26.0 - 34.0 pg   MCHC 31.8 30.0 - 36.0 g/dL   RDW 14.1 11.5 - 15.5 %   Platelets 307 150 - 400 K/uL   nRBC 0.0 0.0 - 0.2 %    Comment: Performed at [facility], [address], [place], [place] [address]  Comprehensive metabolic panel     Status: Abnormal   Collection Time: 03/22/21  6:16 PM  Result Value Ref Range   Sodium 139 135 - 145 mmol/L   Potassium 3.2 (L) 3.5 - 5.1 mmol/L   Chloride 106 98 - 111 mmol/L   CO2 23 22 - 32 mmol/L   Glucose, Bld 140 (H) 70 - 99 mg/dL    Comment: Glucose reference range applies only to samples taken after fasting for at least 8 hours.   BUN 7 (L) 8 - 23 mg/dL   Creatinine, Ser 0.94 0.44 - 1.00 mg/dL   Calcium 9.2 8.9 - 10.3 mg/dL   Total Protein 7.8 6.5 - 8.1 g/dL   Albumin 3.9 3.5 - 5.0 g/dL   AST 19 15 - 41 U/L   ALT 14 0 - 44 U/L   Alkaline Phosphatase 61 38 - 126 U/L   Total Bilirubin 0.8 0.3 - 1.2 mg/dL   GFR, Estimated >60 >60 mL/min    Comment: (NOTE) Calculated using the CKD-EPI Creatinine Equation (2021)    Anion gap 10 5 - 15    Comment: Performed at [facility], [place], [place] [address]  Troponin I (High  Sensitivity)     Status: None   Collection Time: 03/22/21  7:54 PM  Result Value Ref Range   Troponin I (High Sensitivity) 5 <18 ng/L    Comment: (NOTE) Elevated high sensitivity troponin I (hsTnI) values and significant  changes across serial measurements may suggest ACS but many other  chronic and acute conditions are known to elevate hsTnI results.  Refer to the "Links" section for chest pain algorithms and additional  guidance. Performed at [facility], [address], [place],  [address]     Current Facility-Administered Medications  Medication Dose Route Frequency Provider Last Rate Last Admin  . OLANZapine (ZYPREXA) tablet 5 mg  5 mg Oral QHS Caroline Sauger, NP   5 mg at 03/22/21 2234  . verapamil (CALAN-SR) CR tablet 240 mg  240 mg Oral Daily Merlyn Lot, MD   240 mg at 03/22/21 2208   Current Outpatient Medications  Medication Sig Dispense Refill  . aspirin 81 MG tablet Take 81 mg by mouth daily.    . cholecalciferol (VITAMIN D3) 25 MCG (1000 UT) tablet Take 1,000 Units by mouth daily.    Marland Kitchen dexlansoprazole (DEXILANT) 60 MG capsule Take 60 mg by mouth daily.    Marland Kitchen diltiazem (CARDIZEM) 30 MG tablet TAKE 1 TABLET (30 MG TOTAL) BY MOUTH 2 (TWO) TIMES DAILY AS NEEDED (PALPITATIONS). 180 tablet 2  . escitalopram (LEXAPRO) 10 MG tablet Take 10 mg by mouth daily.    . famotidine (PEPCID) 20 MG tablet Take 20 mg by mouth at  bedtime.    . ferrous sulfate 325 (65 FE) MG tablet Take 325 mg by mouth daily with breakfast.     . fluticasone (FLONASE) 50 MCG/ACT nasal spray Place 2 sprays into both nostrils daily as needed for allergies.     . furosemide (LASIX) 40 MG tablet Take 40 mg by mouth every other day.     Marland Kitchen HYDROcodone-acetaminophen (NORCO/VICODIN) 5-325 MG per tablet Take 1 tablet by mouth every 4 (four) hours as needed for moderate pain or severe pain.   0  . levocetirizine (XYZAL) 5 MG tablet Take 1 tablet by mouth every evening.     Marland Kitchen losartan  (COZAAR) 100 MG tablet Take 100 mg by mouth daily.    . meclizine (ANTIVERT) 25 MG tablet Take 1 tablet (25 mg total) by mouth 3 (three) times daily as needed for dizziness. 30 tablet 0  . megestrol (MEGACE) 20 MG tablet TAKE 1 TABLET BY MOUTH EVERY DAY 90 tablet 3  . meloxicam (MOBIC) 15 MG tablet Take 15 mg by mouth daily as needed.    . polyethylene glycol powder (GLYCOLAX/MIRALAX) powder 1 scoop daily or as needed 255 g 11  . PREMARIN vaginal cream PLACE 1 APPLICATORFUL VAGINALLY DAILY. 30 g 12  . rosuvastatin (CRESTOR) 5 MG tablet Take 5 mg by mouth daily.    . verapamil (VERELAN PM) 240 MG 24 hr capsule TAKE 1 CAPSULE BY MOUTH TWICE A DAY 180 capsule 2  . vitamin C (ASCORBIC ACID) 500 MG tablet Take 500 mg by mouth daily.      Musculoskeletal: Strength & Muscle Tone: within normal limits Gait & Station: normal Patient leans: N/A   Psychiatric Specialty Exam:  Presentation  General Appearance: Appropriate for Environment; Casual  Eye Contact:Good  Speech:Clear and Coherent; Normal Rate  Speech Volume:Decreased  Handedness:Right   Mood and Affect  Mood:Anxious; Depressed; Hopeless  Affect:Blunt; Congruent; Depressed   Thought Process  Thought Processes:Coherent  Descriptions of Associations:Intact  Orientation:Full (Time, Place and Person)  Thought Content:Logical; Paranoid Ideation; Obsessions; Rumination  History of Schizophrenia/Schizoaffective disorder:No  Duration of Psychotic Symptoms:Less than six months  Hallucinations:Hallucinations: Auditory Description of Auditory Hallucinations: Noises in her ear,  blowing breeze on her, pressure in her head  Ideas of Reference:Paranoia  Suicidal Thoughts:Suicidal Thoughts: No  Homicidal Thoughts:Homicidal Thoughts: No   Sensorium  Memory:Immediate Good; Recent Good; Remote Good  Judgment:Intact  Insight:Fair   Executive Functions  Concentration:Fair  Attention Span:Fair  [place]  Language:Good   Psychomotor Activity  Psychomotor Activity:Psychomotor Activity: Normal   Assets  Assets:Communication Skills; Desire for Improvement; Leisure Time; Physical Health; Resilience; Social Support   Sleep  Sleep:Sleep: Poor   Physical Exam: Physical Exam Vitals and nursing note reviewed. Exam conducted with a chaperone present.  Constitutional:      Appearance: She is obese.  HENT:     Nose: Nose normal.     Mouth/Throat:     Mouth: Mucous membranes are moist.  Cardiovascular:     Rate and Rhythm: Tachycardia present.  Pulmonary:     Effort: Pulmonary effort is normal.  Musculoskeletal:        General: Normal range of motion.     Cervical back: Normal range of motion and neck supple.  Neurological:     Mental Status: She is alert and oriented to person, place, and time.  Psychiatric:        Attention and Perception: Attention normal. She perceives auditory and visual hallucinations.  Mood and Affect: Mood is anxious and depressed. Affect is blunt and flat.        Speech: Speech normal.        Behavior: Behavior normal. Behavior is cooperative.        Thought Content: Thought content is paranoid.        Cognition and Memory: Cognition and memory normal.        Judgment: Judgment normal.    Review of Systems  Psychiatric/Behavioral: Positive for depression and hallucinations. The patient is nervous/anxious and has insomnia.   All other systems reviewed and are negative.  Blood pressure (!) 168/65, pulse (!) 104, temperature 98.4 F (36.9 C), temperature source Oral, resp. rate (!) 22, height 5\' 6"  (1.676 m), weight (!) 144 kg, SpO2 100 %. Body mass index is 51.24 kg/m.  Treatment Plan Summary: Medication management and Plan The patient is a safety risk to herself and requires psychiatric inpatient admission for stabilization and treatment. Patient started on Zyprexa 5 mg at HS  Disposition: Recommend psychiatric Inpatient  admission when medically cleared. Supportive therapy provided about ongoing stressors.  Caroline Sauger, NP 03/22/2021 10:41 PM

## 2021-03-23 LAB — RESP PANEL BY RT-PCR (FLU A&B, COVID) ARPGX2
Influenza A by PCR: NEGATIVE
Influenza B by PCR: NEGATIVE
SARS Coronavirus 2 by RT PCR: NEGATIVE

## 2021-03-23 MED ORDER — LORAZEPAM 1 MG PO TABS
1.0000 mg | ORAL_TABLET | Freq: Once | ORAL | Status: AC
  Administered 2021-03-23: 1 mg via ORAL
  Filled 2021-03-23: qty 1

## 2021-03-23 MED ORDER — ACETAMINOPHEN 500 MG PO TABS
1000.0000 mg | ORAL_TABLET | Freq: Once | ORAL | Status: AC
  Administered 2021-03-23: 1000 mg via ORAL
  Filled 2021-03-23: qty 2

## 2021-03-23 NOTE — BH Assessment (Signed)
Referral Checks:    Cristal Ford ([phone]- [phone]), Per Dian Situ denied due to [facility] [phone]) [phone] Per Tommie Sams pt is currently under review; follow up needed.    Rosana Hoes [phone]), No answer   [facility] [phone]), [place] requested a refax. Task completed at: 4:39 AM.    Boykin Nearing [phone] or [phone]), No answer   Mayer Camel [phone]). Left a voicemail, requesting a call back.

## 2021-03-23 NOTE — ED Notes (Signed)
Pt reports hearing whispers and a 'crunching sound in my head'. 'I hear a loud speaker that goes really really high and it feels like my brain is squeezed'.

## 2021-03-23 NOTE — ED Notes (Signed)
Pt complaining of chest pain. Repeat EKG performed. Siadecki reviewed EKG.

## 2021-03-23 NOTE — ED Notes (Signed)
Hourly rounding reveals patient in room. No complaints, stable, in no acute distress. Q15 minute rounds and monitoring via Security Cameras to continue. 

## 2021-03-23 NOTE — ED Notes (Signed)
Pt states "I feel like I'm burning up."

## 2021-03-23 NOTE — ED Notes (Signed)
Report to include Situation, Background, Assessment, and Recommendations received from Amy RN. Patient alert and oriented, warm and dry, in no acute distress. Patient denies SI, HI, AVH and pain.  Patient made aware of Q15 minute rounds and security cameras for their safety. Patient instructed to come to me with needs or concerns.

## 2021-03-23 NOTE — ED Notes (Signed)
Pt transported to [facility] via wheelchair.

## 2021-03-23 NOTE — ED Notes (Addendum)
This tech went to the [facility] to obtain an EKG on pt d/t pt c/o cp. While preforming EKG, this tech saw that pt still had her bra on. [place], RN was informed.

## 2021-03-23 NOTE — ED Notes (Signed)
Pt called out using call bell to be disconnected from monitor to use restroom. Pt ambulatory without difficulty. Pt walked out of room following using toilet and states she is hearing the voices again. Pt encouraged to have a seat in room. Pt asked nurse to stay at bedside with her for a few minutes to talk. This RN reassured pt she was safe and encouraged to attempt to rest. Pt states she is scared to sleep because of the voices.

## 2021-03-23 NOTE — ED Notes (Signed)
Pt reports that 'my brain feels like it's crunching'. Pt comforted by this RN. Siadecki notified of pt continuing to endorse auditory hallucinations. Order for ativan placed and given.

## 2021-03-23 NOTE — ED Notes (Signed)
Pt asked to remove her bonnet and mask from home.  Both items placed in belongings bag and placed in storage room. Pt has 3 labeled bags.   Pt given hospital mask.

## 2021-03-23 NOTE — ED Notes (Addendum)
Pt changed into scrubs for comfort. Jean jacket, blouse, jean skirt, and silver hoop earings, and hat, placed in belongings bag. Pt c/o headache, does not want lights dimmed. Pt resting in bed.

## 2021-03-23 NOTE — ED Notes (Signed)
Snack and beverage given. 

## 2021-03-23 NOTE — ED Notes (Signed)
Pt c/o "skin burning." Pt given lunch.

## 2021-03-23 NOTE — BH Assessment (Signed)
Referral Checks:    Cristal Ford ([phone]- [phone]), Per night TTS, Per Dian Situ denied due to [facility] [phone]) [phone] Per Joelene Millin referral has been received and pt is currently under review; follow up needed.    [facility] ([phone]), No answer - left VM on intake confidential VM requesting a call back.   [facility] [phone]), Per Maudie Mercury, no beds available for [facility] [phone] or [phone]), No answer   Mayer Camel [phone]). Left a voicemail, requesting a call back.

## 2021-03-23 NOTE — ED Notes (Signed)
Pt's phone, bag of extra clothes, phone charger and shoes placed in belongings bag. Belongings placed in [employer].

## 2021-03-23 NOTE — ED Notes (Signed)
Breakfast tray given at this time. Pt c/o headache and requesting Tylenol at this time. EDP made aware.

## 2021-03-23 NOTE — ED Notes (Signed)
Patient c/o chest pain. VS are stable, Patient is sitting in a day room with NAD. EDP notified.

## 2021-03-23 NOTE — ED Notes (Signed)
Pt is roaming freely around the room with a steady gait.

## 2021-03-23 NOTE — ED Notes (Signed)
Pt given dinner tray.

## 2021-03-24 DIAGNOSIS — R440 Auditory hallucinations: Secondary | ICD-10-CM

## 2021-03-24 DIAGNOSIS — R69 Illness, unspecified: Secondary | ICD-10-CM | POA: Diagnosis not present

## 2021-03-24 DIAGNOSIS — H8090 Unspecified otosclerosis, unspecified ear: Secondary | ICD-10-CM

## 2021-03-24 MED ORDER — RISPERIDONE 0.5 MG PO TABS: 0.5000 mg | ORAL_TABLET | Freq: Two times a day (BID) | ORAL | 1 refills | Status: DC

## 2021-03-24 NOTE — ED Notes (Signed)
Hourly rounding reveals patient in room. No complaints, stable, in no acute distress. Q15 minute rounds and monitoring via Security Cameras to continue. 

## 2021-03-24 NOTE — ED Notes (Signed)
VS not taken, patient asleep 

## 2021-03-24 NOTE — ED Provider Notes (Signed)
Patient seen by psychiatry and plan for discharge home   Alicianna, [place], MD 03/24/21 [phone]

## 2021-03-24 NOTE — ED Notes (Signed)
Pt given breakfast tray

## 2021-03-24 NOTE — ED Notes (Signed)
Pts sister, Ms. McGehee, asked to be updated if pts condition changes and when pt is transferred out to another facility.
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
## 2021-03-24 NOTE — ED Notes (Signed)
With pts permission, I updated her sister, Ms. McGehee, via phone. Pt also talked to her sister via phone.

## 2021-03-24 NOTE — Discharge Instructions (Signed)
You are cleared by psychiatry for discharge home

## 2021-03-24 NOTE — ED Provider Notes (Signed)
Emergency Medicine Observation Re-evaluation Note  Megan Oneal is a 65 y.o. female, seen on rounds today.  Pt initially presented to the ED for complaints of Hearing Problem Currently, the patient is laying in bed comfortably, denies complaints.  Physical Exam  BP (!) 166/86 (BP Location: Left Arm)   Pulse (!) 102   Temp 98.6 F (37 C) (Oral)   Resp 20   Ht 5\' 6"  (1.676 m)   Wt (!) 144 kg   SpO2 96%   BMI 51.24 kg/m  Physical Exam Constitutional: Resting comfortably. Eyes: Conjunctivae are normal. Head: Atraumatic. Nose: No congestion/rhinnorhea. Mouth/Throat: Mucous membranes are moist. Neck: Normal ROM Cardiovascular: No cyanosis noted. Respiratory: Normal respiratory effort. Gastrointestinal: Non-distended. Genitourinary: deferred Musculoskeletal: No lower extremity tenderness nor edema. Neurologic:  Normal speech and language. No gross focal neurologic deficits are appreciated. Skin:  Skin is warm, dry and intact. No rash noted.    ED Course / MDM  EKG:   I have reviewed the labs performed to date as well as medications administered while in observation.  Recent changes in the last 24 hours include none.  Plan  Current plan is for psychiatric admission. Patient is not under full IVC at this time.   Blake Divine, MD 03/24/21 [phone]

## 2021-03-24 NOTE — ED Notes (Signed)
Pt given lunchbox.

## 2021-03-25 DIAGNOSIS — I10 Essential (primary) hypertension: Secondary | ICD-10-CM | POA: Diagnosis not present

## 2021-03-25 DIAGNOSIS — R531 Weakness: Secondary | ICD-10-CM | POA: Diagnosis not present

## 2021-03-25 DIAGNOSIS — R000 Tachycardia, unspecified: Secondary | ICD-10-CM | POA: Diagnosis not present

## 2021-03-26 ENCOUNTER — Emergency Department: Payer: Medicare HMO

## 2021-03-26 ENCOUNTER — Encounter: Payer: Self-pay | Admitting: Emergency Medicine

## 2021-03-26 ENCOUNTER — Inpatient Hospital Stay
Admission: EM | Admit: 2021-03-26 | Discharge: 2021-04-10 | DRG: 917 | Disposition: A | Discharge status: Home Health | Payer: Medicare HMO | Attending: Internal Medicine | Admitting: Internal Medicine

## 2021-03-26 DIAGNOSIS — Z634 Disappearance and death of family member: Secondary | ICD-10-CM

## 2021-03-26 DIAGNOSIS — Z4682 Encounter for fitting and adjustment of non-vascular catheter: Secondary | ICD-10-CM | POA: Diagnosis not present

## 2021-03-26 DIAGNOSIS — Z87891 Personal history of nicotine dependence: Secondary | ICD-10-CM

## 2021-03-26 DIAGNOSIS — Z96653 Presence of artificial knee joint, bilateral: Secondary | ICD-10-CM | POA: Diagnosis present

## 2021-03-26 DIAGNOSIS — G9340 Encephalopathy, unspecified: Secondary | ICD-10-CM | POA: Diagnosis not present

## 2021-03-26 DIAGNOSIS — F32A Depression, unspecified: Secondary | ICD-10-CM | POA: Diagnosis present

## 2021-03-26 DIAGNOSIS — R000 Tachycardia, unspecified: Secondary | ICD-10-CM | POA: Diagnosis not present

## 2021-03-26 DIAGNOSIS — Z833 Family history of diabetes mellitus: Secondary | ICD-10-CM

## 2021-03-26 DIAGNOSIS — I959 Hypotension, unspecified: Secondary | ICD-10-CM | POA: Diagnosis not present

## 2021-03-26 DIAGNOSIS — Z6841 Body Mass Index (BMI) 40.0 and over, adult: Secondary | ICD-10-CM

## 2021-03-26 DIAGNOSIS — H9190 Unspecified hearing loss, unspecified ear: Secondary | ICD-10-CM | POA: Diagnosis present

## 2021-03-26 DIAGNOSIS — J9811 Atelectasis: Secondary | ICD-10-CM | POA: Diagnosis present

## 2021-03-26 DIAGNOSIS — R4182 Altered mental status, unspecified: Secondary | ICD-10-CM | POA: Diagnosis not present

## 2021-03-26 DIAGNOSIS — R404 Transient alteration of awareness: Secondary | ICD-10-CM | POA: Diagnosis not present

## 2021-03-26 DIAGNOSIS — F4310 Post-traumatic stress disorder, unspecified: Secondary | ICD-10-CM | POA: Diagnosis present

## 2021-03-26 DIAGNOSIS — I2510 Atherosclerotic heart disease of native coronary artery without angina pectoris: Secondary | ICD-10-CM | POA: Diagnosis present

## 2021-03-26 DIAGNOSIS — G928 Other toxic encephalopathy: Secondary | ICD-10-CM | POA: Diagnosis not present

## 2021-03-26 DIAGNOSIS — F09 Unspecified mental disorder due to known physiological condition: Secondary | ICD-10-CM | POA: Diagnosis present

## 2021-03-26 DIAGNOSIS — R440 Auditory hallucinations: Secondary | ICD-10-CM | POA: Diagnosis not present

## 2021-03-26 DIAGNOSIS — R739 Hyperglycemia, unspecified: Secondary | ICD-10-CM | POA: Diagnosis present

## 2021-03-26 DIAGNOSIS — J9601 Acute respiratory failure with hypoxia: Secondary | ICD-10-CM | POA: Diagnosis present

## 2021-03-26 DIAGNOSIS — T481X1A Poisoning by skeletal muscle relaxants [neuromuscular blocking agents], accidental (unintentional), initial encounter: Principal | ICD-10-CM | POA: Diagnosis present

## 2021-03-26 DIAGNOSIS — Z79899 Other long term (current) drug therapy: Secondary | ICD-10-CM

## 2021-03-26 DIAGNOSIS — F29 Unspecified psychosis not due to a substance or known physiological condition: Secondary | ICD-10-CM

## 2021-03-26 DIAGNOSIS — R531 Weakness: Secondary | ICD-10-CM | POA: Diagnosis not present

## 2021-03-26 DIAGNOSIS — R4020 Unspecified coma: Secondary | ICD-10-CM | POA: Diagnosis not present

## 2021-03-26 DIAGNOSIS — Z8249 Family history of ischemic heart disease and other diseases of the circulatory system: Secondary | ICD-10-CM | POA: Diagnosis not present

## 2021-03-26 DIAGNOSIS — Z20822 Contact with and (suspected) exposure to covid-19: Secondary | ICD-10-CM | POA: Diagnosis present

## 2021-03-26 DIAGNOSIS — D72829 Elevated white blood cell count, unspecified: Secondary | ICD-10-CM | POA: Diagnosis not present

## 2021-03-26 DIAGNOSIS — E876 Hypokalemia: Secondary | ICD-10-CM | POA: Diagnosis present

## 2021-03-26 DIAGNOSIS — Z743 Need for continuous supervision: Secondary | ICD-10-CM | POA: Diagnosis not present

## 2021-03-26 DIAGNOSIS — Z9049 Acquired absence of other specified parts of digestive tract: Secondary | ICD-10-CM | POA: Diagnosis not present

## 2021-03-26 DIAGNOSIS — Z981 Arthrodesis status: Secondary | ICD-10-CM | POA: Diagnosis not present

## 2021-03-26 DIAGNOSIS — R69 Illness, unspecified: Secondary | ICD-10-CM | POA: Diagnosis not present

## 2021-03-26 DIAGNOSIS — Z7982 Long term (current) use of aspirin: Secondary | ICD-10-CM

## 2021-03-26 DIAGNOSIS — I10 Essential (primary) hypertension: Secondary | ICD-10-CM | POA: Diagnosis present

## 2021-03-26 DIAGNOSIS — J9690 Respiratory failure, unspecified, unspecified whether with hypoxia or hypercapnia: Secondary | ICD-10-CM | POA: Diagnosis not present

## 2021-03-26 DIAGNOSIS — T380X5A Adverse effect of glucocorticoids and synthetic analogues, initial encounter: Secondary | ICD-10-CM | POA: Diagnosis not present

## 2021-03-26 DIAGNOSIS — Z452 Encounter for adjustment and management of vascular access device: Secondary | ICD-10-CM | POA: Diagnosis not present

## 2021-03-26 LAB — BLOOD GAS, ARTERIAL
Acid-base deficit: 3.4 mmol/L — ABNORMAL HIGH (ref 0.0–2.0)
Bicarbonate: 21.4 mmol/L (ref 20.0–28.0)
FIO2: 0.5
MECHVT: 450 mL
Mechanical Rate: 16
O2 Saturation: 99.7 %
PEEP: 5 cmH2O
Patient temperature: 37
pCO2 arterial: 37 mmHg (ref 32.0–48.0)
pH, Arterial: 7.37 (ref 7.350–7.450)
pO2, Arterial: 200 mmHg — ABNORMAL HIGH (ref 83.0–108.0)

## 2021-03-26 LAB — CBC WITH DIFFERENTIAL/PLATELET
Abs Immature Granulocytes: 0.04 10*3/uL (ref 0.00–0.07)
Basophils Absolute: 0 10*3/uL (ref 0.0–0.1)
Basophils Relative: 1 %
Eosinophils Absolute: 0.2 10*3/uL (ref 0.0–0.5)
Eosinophils Relative: 3 %
HCT: 38.4 % (ref 36.0–46.0)
Hemoglobin: 12.3 g/dL (ref 12.0–15.0)
Immature Granulocytes: 1 %
Lymphocytes Relative: 25 %
Lymphs Abs: 2.2 10*3/uL (ref 0.7–4.0)
MCH: 27.2 pg (ref 26.0–34.0)
MCHC: 32 g/dL (ref 30.0–36.0)
MCV: 85 fL (ref 80.0–100.0)
Monocytes Absolute: 0.7 10*3/uL (ref 0.1–1.0)
Monocytes Relative: 8 %
Neutro Abs: 5.6 10*3/uL (ref 1.7–7.7)
Neutrophils Relative %: 62 %
Platelets: 290 10*3/uL (ref 150–400)
RBC: 4.52 MIL/uL (ref 3.87–5.11)
RDW: 13.8 % (ref 11.5–15.5)
WBC: 8.8 10*3/uL (ref 4.0–10.5)
nRBC: 0 % (ref 0.0–0.2)

## 2021-03-26 LAB — COMPREHENSIVE METABOLIC PANEL
ALT: 22 U/L (ref 0–44)
AST: 28 U/L (ref 15–41)
Albumin: 4 g/dL (ref 3.5–5.0)
Alkaline Phosphatase: 71 U/L (ref 38–126)
Anion gap: 13 (ref 5–15)
BUN: 8 mg/dL (ref 8–23)
CO2: 23 mmol/L (ref 22–32)
Calcium: 9.7 mg/dL (ref 8.9–10.3)
Chloride: 99 mmol/L (ref 98–111)
Creatinine, Ser: 0.97 mg/dL (ref 0.44–1.00)
GFR, Estimated: >60 mL/min (ref >60)
Glucose, Bld: 92 mg/dL (ref 70–99)
Potassium: 3.4 mmol/L — ABNORMAL LOW (ref 3.5–5.1)
Sodium: 135 mmol/L (ref 135–145)
Total Bilirubin: 1.1 mg/dL (ref 0.3–1.2)
Total Protein: 8.2 g/dL — ABNORMAL HIGH (ref 6.5–8.1)

## 2021-03-26 LAB — BLOOD GAS, VENOUS
Acid-base deficit: 0.1 mmol/L (ref 0.0–2.0)
Bicarbonate: 23.9 mmol/L (ref 20.0–28.0)
O2 Saturation: 76.7 %
Patient temperature: 37
pCO2, Ven: 36 mmHg — ABNORMAL LOW (ref 44.0–60.0)
pH, Ven: 7.43 (ref 7.250–7.430)
pO2, Ven: 40 mmHg (ref 32.0–45.0)

## 2021-03-26 LAB — PROCALCITONIN: Procalcitonin: <0.1 ng/mL

## 2021-03-26 LAB — RESP PANEL BY RT-PCR (FLU A&B, COVID) ARPGX2
Influenza A by PCR: NEGATIVE
Influenza B by PCR: NEGATIVE
SARS Coronavirus 2 by RT PCR: NEGATIVE

## 2021-03-26 LAB — URINE DRUG SCREEN, QUALITATIVE (ARMC ONLY)
Amphetamines, Ur Screen: NOT DETECTED
Barbiturates, Ur Screen: NOT DETECTED
Benzodiazepine, Ur Scrn: NOT DETECTED
Cannabinoid 50 Ng, Ur ~~LOC~~: NOT DETECTED
Cocaine Metabolite,Ur ~~LOC~~: NOT DETECTED
MDMA (Ecstasy)Ur Screen: NOT DETECTED
Methadone Scn, Ur: NOT DETECTED
Opiate, Ur Screen: NOT DETECTED
Phencyclidine (PCP) Ur S: NOT DETECTED
Tricyclic, Ur Screen: POSITIVE — AB

## 2021-03-26 LAB — TSH: TSH: 0.694 u[IU]/mL (ref 0.350–4.500)

## 2021-03-26 LAB — CK: Total CK: 416 U/L — ABNORMAL HIGH (ref 38–234)

## 2021-03-26 LAB — ETHANOL: Alcohol, Ethyl (B): <10 mg/dL (ref <10)

## 2021-03-26 LAB — AMMONIA
Ammonia: 10 umol/L (ref 9–35)
Ammonia: 17 umol/L (ref 9–35)

## 2021-03-26 LAB — SALICYLATE LEVEL: Salicylate Lvl: <7 mg/dL — ABNORMAL LOW (ref 7.0–30.0)

## 2021-03-26 LAB — MAGNESIUM: Magnesium: 2.1 mg/dL (ref 1.7–2.4)

## 2021-03-26 LAB — ACETAMINOPHEN LEVEL: Acetaminophen (Tylenol), Serum: <10 ug/mL — ABNORMAL LOW (ref 10–30)

## 2021-03-26 LAB — PHOSPHORUS: Phosphorus: 4.5 mg/dL (ref 2.5–4.6)

## 2021-03-26 LAB — BRAIN NATRIURETIC PEPTIDE: B Natriuretic Peptide: 22.6 pg/mL (ref 0.0–100.0)

## 2021-03-26 MED ORDER — LOSARTAN POTASSIUM 50 MG PO TABS
100.0000 mg | ORAL_TABLET | Freq: Every day | ORAL | Status: DC
  Administered 2021-03-27 – 2021-03-29 (×3): 100 mg
  Filled 2021-03-26 (×3): qty 2

## 2021-03-26 MED ORDER — ASCORBIC ACID 500 MG PO TABS
500.0000 mg | ORAL_TABLET | Freq: Every day | ORAL | Status: DC
  Administered 2021-03-27 – 2021-03-29 (×3): 500 mg
  Filled 2021-03-26 (×3): qty 1

## 2021-03-26 MED ORDER — HYDRALAZINE HCL 20 MG/ML IJ SOLN
10.0000 mg | INTRAMUSCULAR | Status: DC | PRN
  Administered 2021-03-29: 10 mg via INTRAVENOUS
  Filled 2021-03-26: qty 1

## 2021-03-26 MED ORDER — VITAMIN D 25 MCG (1000 UNIT) PO TABS
1000.0000 [IU] | ORAL_TABLET | Freq: Every day | ORAL | Status: DC
  Administered 2021-03-27 – 2021-03-28 (×2): 1000 [IU] via ORAL
  Filled 2021-03-26 (×2): qty 1

## 2021-03-26 MED ORDER — DOCUSATE SODIUM 100 MG PO CAPS: 100.0000 mg | ORAL_CAPSULE | Freq: Two times a day (BID) | ORAL | Status: DC | PRN

## 2021-03-26 MED ORDER — ONDANSETRON HCL 4 MG/2ML IJ SOLN: 4.0000 mg | Freq: Four times a day (QID) | INTRAMUSCULAR | Status: DC | PRN

## 2021-03-26 MED ORDER — PROPOFOL 1000 MG/100ML IV EMUL
0.0000 ug/kg/min | INTRAVENOUS | Status: DC
  Administered 2021-03-27 (×2): 45 ug/kg/min via INTRAVENOUS
  Administered 2021-03-27: 40 ug/kg/min via INTRAVENOUS
  Administered 2021-03-27 (×3): 45 ug/kg/min via INTRAVENOUS
  Administered 2021-03-27: 40 ug/kg/min via INTRAVENOUS
  Administered 2021-03-28: 45 ug/kg/min via INTRAVENOUS
  Administered 2021-03-28: 40 ug/kg/min via INTRAVENOUS
  Administered 2021-03-28: 45 ug/kg/min via INTRAVENOUS
  Administered 2021-03-28: 20 ug/kg/min via INTRAVENOUS
  Administered 2021-03-28 (×5): 45 ug/kg/min via INTRAVENOUS
  Administered 2021-03-29: 25 ug/kg/min via INTRAVENOUS
  Administered 2021-03-29: 23.116 ug/kg/min via INTRAVENOUS
  Administered 2021-03-29: 25 ug/kg/min via INTRAVENOUS
  Filled 2021-03-26 (×20): qty 100

## 2021-03-26 MED ORDER — VANCOMYCIN HCL 1000 MG/200ML IV SOLN
1000.0000 mg | Freq: Two times a day (BID) | INTRAVENOUS | Status: DC
  Filled 2021-03-26 (×2): qty 200

## 2021-03-26 MED ORDER — PROPOFOL 1000 MG/100ML IV EMUL
INTRAVENOUS | Status: AC
  Administered 2021-03-26: 10 ug/kg/min via INTRAVENOUS
  Filled 2021-03-26: qty 100

## 2021-03-26 MED ORDER — SODIUM CHLORIDE 0.9 % IV SOLN
2.0000 g | Freq: Two times a day (BID) | INTRAVENOUS | Status: DC
  Administered 2021-03-27 – 2021-03-28 (×3): 2 g via INTRAVENOUS
  Filled 2021-03-26: qty 2
  Filled 2021-03-26 (×3): qty 20
  Filled 2021-03-26: qty 2

## 2021-03-26 MED ORDER — SODIUM CHLORIDE 0.9 % IV SOLN
2.0000 g | Freq: Once | INTRAVENOUS | Status: AC
  Administered 2021-03-26: 2 g via INTRAVENOUS
  Filled 2021-03-26: qty 20

## 2021-03-26 MED ORDER — DEXTROSE 5 % IV SOLN
10.0000 mg/kg | Freq: Once | INTRAVENOUS | Status: AC
  Administered 2021-03-27: 935 mg via INTRAVENOUS
  Filled 2021-03-26: qty 18.7

## 2021-03-26 MED ORDER — VANCOMYCIN HCL 500 MG/100ML IV SOLN
500.0000 mg | Freq: Once | INTRAVENOUS | Status: DC
  Filled 2021-03-26: qty 100

## 2021-03-26 MED ORDER — ETOMIDATE 2 MG/ML IV SOLN
INTRAVENOUS | Status: AC | PRN
  Administered 2021-03-26: 30 mg via INTRAVENOUS

## 2021-03-26 MED ORDER — ASPIRIN 81 MG PO CHEW
81.0000 mg | CHEWABLE_TABLET | Freq: Every day | ORAL | Status: DC
  Administered 2021-03-27 – 2021-03-29 (×3): 81 mg
  Filled 2021-03-26 (×3): qty 1

## 2021-03-26 MED ORDER — LACTATED RINGERS IV SOLN: INTRAVENOUS | Status: DC

## 2021-03-26 MED ORDER — POTASSIUM CHLORIDE 20 MEQ PO PACK: 40.0000 meq | PACK | Freq: Once | ORAL | Status: DC

## 2021-03-26 MED ORDER — ROCURONIUM BROMIDE 50 MG/5ML IV SOLN
INTRAVENOUS | Status: AC | PRN
  Administered 2021-03-26: 120 mg via INTRAVENOUS

## 2021-03-26 MED ORDER — FAMOTIDINE IN NACL 20-0.9 MG/50ML-% IV SOLN
20.0000 mg | INTRAVENOUS | Status: DC
  Administered 2021-03-27 – 2021-03-30 (×5): 20 mg via INTRAVENOUS
  Filled 2021-03-26 (×5): qty 50

## 2021-03-26 MED ORDER — DEXAMETHASONE SODIUM PHOSPHATE 10 MG/ML IJ SOLN
0.1500 mg/kg | Freq: Four times a day (QID) | INTRAMUSCULAR | Status: DC
  Administered 2021-03-26 – 2021-03-28 (×7): 22 mg via INTRAVENOUS
  Filled 2021-03-26 (×8): qty 2.2

## 2021-03-26 MED ORDER — ROSUVASTATIN CALCIUM 5 MG PO TABS
5.0000 mg | ORAL_TABLET | Freq: Every day | ORAL | Status: DC
  Administered 2021-03-27 – 2021-03-29 (×3): 5 mg
  Filled 2021-03-26 (×4): qty 1

## 2021-03-26 MED ORDER — POLYETHYLENE GLYCOL 3350 17 G PO PACK: 17.0000 g | PACK | Freq: Every day | ORAL | Status: DC | PRN

## 2021-03-26 MED ORDER — ACYCLOVIR 200 MG PO CAPS
200.0000 mg | ORAL_CAPSULE | Freq: Once | ORAL | Status: DC
  Filled 2021-03-26: qty 1

## 2021-03-26 MED ORDER — SODIUM CHLORIDE 0.9 % IV SOLN
2.0000 g | INTRAVENOUS | Status: DC
  Administered 2021-03-26 – 2021-03-28 (×9): 2 g via INTRAVENOUS
  Filled 2021-03-26: qty 2000
  Filled 2021-03-26: qty 2
  Filled 2021-03-26: qty 2000
  Filled 2021-03-26: qty 2
  Filled 2021-03-26 (×2): qty 2000
  Filled 2021-03-26: qty 2
  Filled 2021-03-26 (×2): qty 2000
  Filled 2021-03-26: qty 2
  Filled 2021-03-26 (×2): qty 2000
  Filled 2021-03-26 (×2): qty 2
  Filled 2021-03-26: qty 2000
  Filled 2021-03-26: qty 2
  Filled 2021-03-26: qty 2000

## 2021-03-26 MED ORDER — FENTANYL CITRATE (PF) 100 MCG/2ML IJ SOLN
100.0000 ug | INTRAMUSCULAR | Status: DC | PRN
  Administered 2021-03-27: 100 ug via INTRAVENOUS
  Filled 2021-03-26: qty 2

## 2021-03-26 MED ORDER — MIDAZOLAM HCL 5 MG/5ML IJ SOLN
INTRAMUSCULAR | Status: AC | PRN
  Administered 2021-03-26: 2 mg via INTRAVENOUS

## 2021-03-26 MED ORDER — FERROUS SULFATE 325 (65 FE) MG PO TABS
325.0000 mg | ORAL_TABLET | Freq: Every day | ORAL | Status: DC
  Administered 2021-03-27 – 2021-03-29 (×3): 325 mg via ORAL
  Filled 2021-03-26 (×3): qty 1

## 2021-03-26 MED ORDER — VANCOMYCIN HCL 2000 MG/400ML IV SOLN
2000.0000 mg | Freq: Once | INTRAVENOUS | Status: AC
  Administered 2021-03-27: 2000 mg via INTRAVENOUS
  Filled 2021-03-26: qty 400

## 2021-03-26 MED ORDER — FENTANYL CITRATE (PF) 100 MCG/2ML IJ SOLN: 100.0000 ug | INTRAMUSCULAR | Status: DC | PRN

## 2021-03-26 MED ORDER — DEXTROSE 5 % IV SOLN
10.0000 mg/kg | Freq: Three times a day (TID) | INTRAVENOUS | Status: DC
  Administered 2021-03-27 – 2021-03-28 (×4): 935 mg via INTRAVENOUS
  Filled 2021-03-26 (×8): qty 18.7

## 2021-03-26 NOTE — ED Notes (Signed)
Report called to recieving ICU RN

## 2021-03-26 NOTE — Progress Notes (Signed)
Late Entry;  pt presents to ED w/ AMS. Pt intubated by ER MD on 2nd attempt.  Pt w/ difficult airway see MD note for disclosure. BBS noted, (+) etco2. Pt placed on doc vent support. ICU RT made aware of pt.Pt transported to ICU by assisting Rt.

## 2021-03-26 NOTE — ED Triage Notes (Signed)
Patient arrived via EMS for AMS. Patient was last well known at 76 with family. Patient possibly could have taken flexeril but not sure.

## 2021-03-26 NOTE — ED Notes (Signed)
Pt escorted to ICU by RN, assisting RN, and RT on monitor and ventilator. Tolerated well

## 2021-03-26 NOTE — ED Notes (Signed)
Patient to CT at this time

## 2021-03-26 NOTE — ED Notes (Signed)
Patient cleaned up. New gown, liens, and chuck pad placed.

## 2021-03-26 NOTE — H&P (Signed)
NAME:  Megan Oneal, MRN:  [id number], DOB:  10-05-1956, LOS: 0 ADMISSION DATE:  03/26/2021, CONSULTATION DATE: 03/26/2021 REFERRING MD: Dr. Tamala Julian, CHIEF COMPLAINT: Altered Mental Status   History of Present Illness:  This is a 65 yo female who presented to [facility] ER on 04/6 via EMS with unresponsiveness with reported last well known time at 06:30 am on 04/6.  Family also reported finding a flexiril bottle near the pt.  She recently presented to [facility] ER on 04/4 with auditory hallucinations that started a couple of months prior to that ER presentation.  At that time she was evaluated by psychiatry and denied suicidal or homicidal ideations  She did not require hospital admission but was instructed to start Risperdal 0.5 mg bid and to follow-up with her PCP and Otolaryngologist.    ED Course Upon arrival to the ER pt tachycardic but stable on RA.  Per ER notes pt able to withdraw from noxious stimuli and pupils were symmetric/reactive.  EKG revealed sinus tachycardia, hr 103 but no signs of ischemia.  Respiratory Panel by RT-PCR negative, however CXR concerning for bibasilar atelectasis.  CT Head negative for acute intracranial abnormalities.  Lab results revealed K+ 3.4, acetaminophen level <81, salicylate level <0.1, TSH 0.694, urine drug positive for tricyclic's, and vbg pH 7.51/WCH8 36/bicarb 23.9.  She was subsequently mechanically intubated.  ED physician attempted lumbar puncture, however was unsuccessful.  Pt received acyclovir, vancomycin, and ceftriaxone for meningitis coverage. PCCM team contacted for ICU admission.    Pertinent  Medical History  Right Neck Vascular Bruit Palpitations Otalgia HTN Endometriosis  Type II Diabetes Mellitus Degenerative Disc Disease Anxiety Depression   Significant Hospital Events: Including procedures, antibiotic start and stop dates in addition to other pertinent events   . ER physician attempted lumbar puncture, however was unsuccessful . Pt admitted to  ICU with acute encephalopathy of unknown etiology mechanically intubated . Vancomycin 04/6>> . Ceftriaxone 04/6>> . Acyclovir 04/6>>  Interim History / Subjective:  Pt mechanically intubated   Objective   Blood pressure (!) 175/99, pulse (!) 117, temperature 98.6 F (37 C), temperature source Oral, resp. rate 15, height _0  (1.676 m), weight (!) 144.2 kg, SpO2 100 %.       No intake or output data in the 24 hours ending 03/26/21 1948 Filed Weights   03/26/21 1729  Weight: (!) 144.2 kg    Examination: General: acutely ill appearing female, NAD mechanically intubated  HENT: supple, no JVD  Lungs: clear throughout, even, non labored  Cardiovascular: sinus tach, no R/G, 2+ radial/2+ distal pulses, no edema  Abdomen: +BS x4, soft, non distended Extremities: normal bulk and tone, no edema  Neuro: sedated/unresponsive, not following commands, PERRL; no nuchal rigidity present  GU: foley catheter intact   Labs/imaging that I havepersonally reviewed  (right click and "Reselect all SmartList Selections" daily)  04/6: K+ 3.4, acetaminophen level <52, salicylate level <7.7, TSH 0.694, urine drug positive for tricyclic's, and vbg pH 8.24/MPN3 36/bicarb 23 04/6: CXR concerning for bibasilar atelectasis 04/6: Respiratory Panel by RT-PCR negative 04/6: CT Head negative for acute intracranial abnormalities  Resolved Hospital Problem list   N/A  Assessment & Plan:   Mechanical Intubation for airway protection  Full vent support for now-vent settings reviewed and established  SBT once all parameters met  VAP bundle implemented  HTN Continuous telemetry monitoring  Resume outpatient aspirin, losartan, and rosuvastatin  Prn hydralazine for bp management   Hypokalemia  Trend BMP Replace electrolytes as indicated  Monitor UOP  Possible meningitis  Trend WBC and monitor fever curve  Will check PCT  Follow cultures  May require lumbar puncture to r/o meningitis; will continue abx  coverage for possible meningitis and start iv decadron  Continue empiric vancomycin, ceftriaxone, and will start ampicillin    Acute encephalopathy of unclear etiology possibly secondary to drug overdose~CT Head 04/6 negative for acute intracranial abnormalities  Mechanical intubation pain/discomfort  Maintain RASS goal 0 to -1 Prn propofol gtt and prn fentanyl to maintain RASS goal  WUA daily  Will check ammonia level    Best practice (right click and "Reselect all SmartList Selections" daily)  Diet:  NPO Pain/Anxiety/Delirium protocol (if indicated): Yes (RASS goal -1) VAP protocol (if indicated): Yes DVT prophylaxis: LMWH GI prophylaxis: H2B Glucose control:  SSI No Central venous access:  N/A Arterial line:  N/A Foley:  Yes, and it is still needed Mobility:  bed rest  PT consulted: N/A Last date of multidisciplinary goals of care discussion [N/A] Code Status:  full code Disposition: ICU   Labs   CBC: Recent Labs  Lab 03/21/21 1120 03/22/21 1816 03/26/21 1732  WBC 10.2 11.6* 8.8  NEUTROABS 6.7  --  5.6  HGB 11.9* 11.9* 12.3  HCT 38.3 37.4 38.4  MCV 88.0 86.2 85.0  PLT 329 307 829    Basic Metabolic Panel: Recent Labs  Lab 03/21/21 1120 03/22/21 1816 03/26/21 1732  NA 136 139 135  K 3.7 3.2* 3.4*  CL 104 106 99  CO2 21* 23 23  GLUCOSE 121* 140* 92  BUN 10 7* 8  CREATININE 0.90 0.94 0.97  CALCIUM 9.2 9.2 9.7   GFR: Estimated Creatinine Clearance: 87.4 mL/min (by C-G formula based on SCr of 0.97 mg/dL). Recent Labs  Lab 03/21/21 1120 03/22/21 1816 03/26/21 1732  WBC 10.2 11.6* 8.8    Liver Function Tests: Recent Labs  Lab 03/21/21 1120 03/22/21 1816 03/26/21 1732  AST _0 ALT _1 ALKPHOS 69 61 71  BILITOT 0.6 0.8 1.1  PROT 8.1 7.8 8.2*  ALBUMIN 4.0 3.9 4.0   No results for input(s): LIPASE, AMYLASE in the last 168 hours. Recent Labs  Lab 03/26/21 1732  AMMONIA 10    ABG    Component Value Date/Time   HCO3 23.9  03/26/2021 1726   ACIDBASEDEF 0.1 03/26/2021 1726   O2SAT 76.7 03/26/2021 1726     Coagulation Profile: No results for input(s): INR, PROTIME in the last 168 hours.  Cardiac Enzymes: No results for input(s): CKTOTAL, CKMB, CKMBINDEX, TROPONINI in the last 168 hours.  HbA1C: No results found for: HGBA1C  CBG: No results for input(s): GLUCAP in the last 168 hours.  Review of Systems:   Unable to assess pt mechanically intubated   Past Medical History:  She,  has a past medical history of Anxiety (03/12/2013), Degenerative disc disease, lumbar, Diabetes mellitus without complication ([place]), Endometriosis, Frequent PVCs, Hypertension, Otalgia, unspecified, Palpitations (10/04/2010), and Vascular bruit (01/2013).   Surgical History:   Past Surgical History:  Procedure Laterality Date  . BREAST SURGERY     benign tumor excised-age 47  . CARDIOVASCULAR STRESS TEST  03/13/2008   No scintigraphic evidence of inducible myocardial ischemia, EKG negative for ischemia, no ECG changes.  . COLONOSCOPY  2009  . COLONOSCOPY N/A 09/21/2019   Procedure: COLONOSCOPY;  Surgeon: Rogene Houston, MD;  Location: [facility];  Service: Endoscopy;  Laterality: N/A;  2:00-10:30  . CYST EXCISION  2007  Scalp  . DOPPLER ECHOCARDIOGRAPHY  03/13/2008   EF >93%, LV systolic function is normal, LV wall function is normal  . LAPAROSCOPIC CHOLECYSTECTOMY  2010  . LUMBAR LAMINECTOMY  2009    L4-L5 and L5-S1 laminectomy with diskectomy; transforaminal  . POLYPECTOMY  09/21/2019   Procedure: POLYPECTOMY;  Surgeon: Rogene Houston, MD;  Location: [facility];  Service: Endoscopy;;  . REPLACEMENT TOTAL KNEE    . SLEEP STUDY  10/29/2009   AHI-1.8/hr, during REM 6.4/hr; RDI-8.0/hr, during REM 15.0/hr; avg oxygen during REM and NREM was 94%  . TOTAL KNEE ARTHROPLASTY Bilateral   . TUBAL LIGATION       Social History:   reports that she quit smoking about 24 years ago. Her smoking use included cigarettes. She  started smoking about 48 years ago. She has a 40.00 pack-year smoking history. She quit smokeless tobacco use about 26 years ago. She reports that she does not drink alcohol and does not use drugs.   Family History:  Her family history includes Diabetes in her brother and mother; Hypertension in her brother, father, and mother.   Allergies Allergies  Allergen Reactions  . Metoprolol Other (See Comments)    Fatigue and malaise; wheezing  . Sulfa Antibiotics      Home Medications  Prior to Admission medications   Medication Sig Start Date End Date Taking? Authorizing Provider  aspirin 81 MG tablet Take 81 mg by mouth daily.    [provider]  cholecalciferol (VITAMIN D3) 25 MCG (1000 UT) tablet Take 1,000 Units by mouth daily.    [provider]  dexlansoprazole (DEXILANT) 60 MG capsule Take 60 mg by mouth daily.    [provider]  diltiazem (CARDIZEM) 30 MG tablet TAKE 1 TABLET (30 MG TOTAL) BY MOUTH 2 (TWO) TIMES DAILY AS NEEDED (PALPITATIONS). 10/03/20   Arnoldo Lenis, MD  escitalopram (LEXAPRO) 10 MG tablet Take 10 mg by mouth daily.    [provider]  famotidine (PEPCID) 20 MG tablet Take 20 mg by mouth at bedtime. 01/07/20   [provider]  ferrous sulfate 325 (65 FE) MG tablet Take 325 mg by mouth daily with breakfast.     [provider]  fluticasone (FLONASE) 50 MCG/ACT nasal spray Place 2 sprays into both nostrils daily as needed for allergies.  07/24/19   [provider]  furosemide (LASIX) 40 MG tablet Take 40 mg by mouth every other day.     [provider]  HYDROcodone-acetaminophen (NORCO/VICODIN) 5-325 MG per tablet Take 1 tablet by mouth every 4 (four) hours as needed for moderate pain or severe pain.  11/23/14   [provider]  levocetirizine (XYZAL) 5 MG tablet Take 1 tablet by mouth every evening.  07/24/19   [provider]  losartan (COZAAR) 100 MG tablet Take 100 mg by mouth  daily.    [provider]  meclizine (ANTIVERT) 25 MG tablet Take 1 tablet (25 mg total) by mouth 3 (three) times daily as needed for dizziness. 03/21/21   Isla Pence, MD  megestrol (MEGACE) 20 MG tablet TAKE 1 TABLET BY MOUTH EVERY DAY 12/29/18   Florian Buff, MD  meloxicam (MOBIC) 15 MG tablet Take 15 mg by mouth daily as needed. 02/05/20   [provider]  polyethylene glycol powder (GLYCOLAX/MIRALAX) powder 1 scoop daily or as needed 06/07/18   Florian Buff, MD  PREMARIN vaginal cream PLACE 1 APPLICATORFUL VAGINALLY DAILY. 12/30/20   Florian Buff,  MD  risperiDONE (RISPERDAL) 0.5 MG tablet Take 1 tablet (0.5 mg total) by mouth 2 (two) times daily. 03/24/21   Clapacs, Madie Reno, MD  rosuvastatin (CRESTOR) 5 MG tablet Take 5 mg by mouth daily. 09/28/20   [provider]  verapamil (VERELAN PM) 240 MG 24 hr capsule TAKE 1 CAPSULE BY MOUTH TWICE A DAY 12/02/18   Arnoldo Lenis, MD  vitamin C (ASCORBIC ACID) 500 MG tablet Take 500 mg by mouth daily.    [provider]     Critical care time: 29 minutes      Marda Stalker, [place] Pager [phone] (please enter 7 digits) PCCM Consult Pager [phone] (please enter 7 digits)

## 2021-03-26 NOTE — ED Notes (Signed)
Late Entry - assumed care of pt at 1900. Daughter at bedside with SO, pt has incomprehensible speech, withdrawals to pain, pupils 3 and sluggish at 1900. Intubated by MD Hulan Saas, see rapid response narrator. Family not at bedside for intubation. Temperature indwelling foley placed. OG placed. OG tube and ETT confirmed with XR. Pt appears comfortable, no restless movements noted. Family no longer at bedside. This RN ensured sister's number is in chart, contact Elmyra Ricks with changes to pt status. This RN attempted to call ICU for report, no answer at this time

## 2021-03-26 NOTE — ED Notes (Signed)
Patient transported to inpatient unit by primary RN, Joseph Art, and RRT.

## 2021-03-26 NOTE — Consult Note (Signed)
Pharmacy Antibiotic Note  Megan Oneal is a 65 y.o. female admitted on 03/26/2021 with un-responsiveness and concern for meningitis. Vitals significant for hypothermia, hypertension, tachycardia. Labs on admission without leukocytosis, no left shift, PCT < 0.10. CT head with no intracranial abnormalities. Pharmacy has been consulted for vancomycin and acyclovir dosing. Patient is also ordered ceftriaxone and ampicillin.   Plan:  Acyclovir 935 mg (10 mg/kg AdjBW) IV q8h  Vancomycin 2500 mg IV LD x 1 followed by maintenance regimen of  Vancomycin 1000 mg IV q12h --Calculated Cmin 17.5 (goal 15-20) --Daily Scr per protocol --Levels at steady state as indicated  Height: 5\' 6"  (167.6 cm) Weight: (!) 144.2 kg (317 lb 14.5 oz) IBW/kg (Calculated) : 59.3  Temp (24hrs), Avg:97.6 F (36.4 C), Min:96.44 F (35.8 C), Max:98.6 F (37 C)  Recent Labs  Lab 03/21/21 1120 03/22/21 1816 03/26/21 1732  WBC 10.2 11.6* 8.8  CREATININE 0.90 0.94 0.97    Estimated Creatinine Clearance: 87.4 mL/min (by C-G formula based on SCr of 0.97 mg/dL).    Allergies  Allergen Reactions  . Metoprolol Other (See Comments)    Fatigue and malaise; wheezing  . Sulfa Antibiotics     Antimicrobials this admission: Ceftriaxone 4/6 >>  Ampicillin 4/6 >>  Vancomycin 4/6 >>  Acyclovir 4/6 >>   Dose adjustments this admission: N/A  Microbiology results: 4/6 BCx: pending 4/6 MRSA PCR: pending  Thank you for allowing pharmacy to be a part of this patient's care.  Benita Gutter 03/26/2021 10:47 PM

## 2021-03-26 NOTE — Consult Note (Signed)
PHARMACY -  BRIEF ANTIBIOTIC NOTE   Pharmacy has received consult(s) for vancomycin and acyclovir from an ED provider.  The patient's profile has been reviewed for ht/wt/allergies/indication/available labs.    One time order(s) placed for --Vancomycin 2.5 g x 1 --Acyclovir 935 mg (10 mg/kg AdjBW)  Further antibiotics/pharmacy consults should be ordered by admitting physician if indicated.                       Thank you, Benita Gutter 03/26/2021  9:46 PM

## 2021-03-26 NOTE — ED Provider Notes (Signed)
[facility] Emergency Department Provider Note  ____________________________________________   Event Date/Time   First MD Initiated Contact with Patient 03/26/21 1723     (approximate)  I have reviewed the triage vital signs and the nursing notes.   HISTORY  Chief Complaint Altered Mental Status   HPI Megan Oneal is a 65 y.o. female with a past medical history of HTN, CHF, DM, palpitations, anxiety and recent visit in the emergency room 4/4 for some auditory hallucinations which apparently per review of records started couple months ago discharged on low-dose Risperdal who presents EMS from home after family noticed she was unresponsive.  Patient states he was last normal earlier this morning but then when they checked him several times today she seemed like she was not responding and is lying in bed.  No recent witnessed falls injuries or other acute sick symptoms.  Per EMS he was in follow-up Flexeril near her.  Her sister is not sure if she had taken any of her medicines but she was not supposed to more than she was supposed to.  Patient has not voiced any recent SI to family members.         Past Medical History:  Diagnosis Date  . Anxiety 03/12/2013  . Degenerative disc disease, lumbar    Epidural injections in 2007  . Diabetes mellitus without complication ([place])   . Endometriosis   . Frequent PVCs    12,000/24 hours  . Hypertension   . Otalgia, unspecified   . Palpitations 10/04/2010  . Vascular bruit 01/2013   Right neck    Patient Active Problem List   Diagnosis Date Noted  . Acute encephalopathy 03/26/2021  . Auditory hallucinations 03/24/2021  . Otosclerosis 03/24/2021  . Encounter for screening colonoscopy 02/15/2019  . Obesity 03/12/2013  . Anxiety 03/12/2013  . Laboratory test 03/12/2013  . Frequent PVCs   . Degenerative disc disease, lumbar   . Hypertension   . Endometriosis   . Vascular bruit 01/21/2013  . Palpitations  10/04/2010    Past Surgical History:  Procedure Laterality Date  . BREAST SURGERY     benign tumor excised-age 73  . CARDIOVASCULAR STRESS TEST  03/13/2008   No scintigraphic evidence of inducible myocardial ischemia, EKG negative for ischemia, no ECG changes.  . COLONOSCOPY  2009  . COLONOSCOPY N/A 09/21/2019   Procedure: COLONOSCOPY;  Surgeon: Rogene Houston, MD;  Location: [facility];  Service: Endoscopy;  Laterality: N/A;  2:00-10:30  . CYST EXCISION  2007   Scalp  . DOPPLER ECHOCARDIOGRAPHY  03/13/2008   EF >93%, LV systolic function is normal, LV wall function is normal  . LAPAROSCOPIC CHOLECYSTECTOMY  2010  . LUMBAR LAMINECTOMY  2009    L4-L5 and L5-S1 laminectomy with diskectomy; transforaminal  . POLYPECTOMY  09/21/2019   Procedure: POLYPECTOMY;  Surgeon: Rogene Houston, MD;  Location: [facility];  Service: Endoscopy;;  . REPLACEMENT TOTAL KNEE    . SLEEP STUDY  10/29/2009   AHI-1.8/hr, during REM 6.4/hr; RDI-8.0/hr, during REM 15.0/hr; avg oxygen during REM and NREM was 94%  . TOTAL KNEE ARTHROPLASTY Bilateral   . TUBAL LIGATION      Prior to Admission medications   Medication Sig Start Date End Date Taking? Authorizing Provider  aspirin 81 MG tablet Take 81 mg by mouth daily.    [provider]  cholecalciferol (VITAMIN D3) 25 MCG (1000 UT) tablet Take 1,000 Units by mouth daily.    [provider]  dexlansoprazole (DEXILANT) 60 MG capsule Take 60 mg by mouth daily.    [provider]  diltiazem (CARDIZEM) 30 MG tablet TAKE 1 TABLET (30 MG TOTAL) BY MOUTH 2 (TWO) TIMES DAILY AS NEEDED (PALPITATIONS). 10/03/20   Arnoldo Lenis, MD  escitalopram (LEXAPRO) 10 MG tablet Take 10 mg by mouth daily.    [provider]  famotidine (PEPCID) 20 MG tablet Take 20 mg by mouth at bedtime. 01/07/20   [provider]  ferrous sulfate 325 (65 FE) MG tablet Take 325 mg by mouth daily with breakfast.     [provider]   fluticasone (FLONASE) 50 MCG/ACT nasal spray Place 2 sprays into both nostrils daily as needed for allergies.  07/24/19   [provider]  furosemide (LASIX) 40 MG tablet Take 40 mg by mouth every other day.     [provider]  HYDROcodone-acetaminophen (NORCO/VICODIN) 5-325 MG per tablet Take 1 tablet by mouth every 4 (four) hours as needed for moderate pain or severe pain.  11/23/14   [provider]  levocetirizine (XYZAL) 5 MG tablet Take 1 tablet by mouth every evening.  07/24/19   [provider]  losartan (COZAAR) 100 MG tablet Take 100 mg by mouth daily.    [provider]  meclizine (ANTIVERT) 25 MG tablet Take 1 tablet (25 mg total) by mouth 3 (three) times daily as needed for dizziness. 03/21/21   Isla Pence, MD  megestrol (MEGACE) 20 MG tablet TAKE 1 TABLET BY MOUTH EVERY DAY 12/29/18   Florian Buff, MD  meloxicam (MOBIC) 15 MG tablet Take 15 mg by mouth daily as needed. 02/05/20   [provider]  polyethylene glycol powder (GLYCOLAX/MIRALAX) powder 1 scoop daily or as needed 06/07/18   Florian Buff, MD  PREMARIN vaginal cream PLACE 1 APPLICATORFUL VAGINALLY DAILY. 12/30/20   Florian Buff, MD  risperiDONE (RISPERDAL) 0.5 MG tablet Take 1 tablet (0.5 mg total) by mouth 2 (two) times daily. 03/24/21   Clapacs, Madie Reno, MD  rosuvastatin (CRESTOR) 5 MG tablet Take 5 mg by mouth daily. 09/28/20   [provider]  verapamil (VERELAN PM) 240 MG 24 hr capsule TAKE 1 CAPSULE BY MOUTH TWICE A DAY 12/02/18   Arnoldo Lenis, MD  vitamin C (ASCORBIC ACID) 500 MG tablet Take 500 mg by mouth daily.    [provider]    Allergies Metoprolol and Sulfa antibiotics  Family History  Problem Relation Age of Onset  . Diabetes Mother   . Hypertension Mother   . Diabetes Brother   . Hypertension Brother   . Hypertension Father     Social History Social History   Tobacco Use  . Smoking status: Former Smoker    Packs/day:  2.00    Years: 20.00    Pack years: 40.00    Types: Cigarettes    Start date: 08/03/1972    Quit date: 08/03/1996    Years since quitting: 24.6  . Smokeless tobacco: Former Systems developer    Quit date: 12/21/1994  Substance Use Topics  . Alcohol use: No  . Drug use: No    Review of Systems  Review of Systems  Unable to perform ROS: Patient nonverbal      ____________________________________________   PHYSICAL EXAM:  VITAL SIGNS: ED Triage Vitals  Enc Vitals Group     BP      Pulse      Resp      Temp  Temp src      SpO2      Weight      Height      Head Circumference      Peak Flow      Pain Score      Pain Loc      Pain Edu?      Excl. in [place]?    Vitals:   03/26/21 2035 03/26/21 2040  BP: (!) 176/84 (!) 184/84  Pulse: 99 98  Resp: 18 19  Temp: (!) 97.4 F (36.3 C) (!) 97.4 F (36.3 C)  SpO2: 100% 100%   Physical Exam Vitals and nursing note reviewed.  Constitutional:      General: She is in acute distress.     Appearance: She is well-developed. She is obese. She is ill-appearing.  HENT:     Head: Normocephalic and atraumatic.     Right Ear: External ear normal.     Left Ear: External ear normal.     Nose: Nose normal.     Mouth/Throat:     Mouth: Mucous membranes are dry.  Eyes:     Conjunctiva/sclera: Conjunctivae normal.  Cardiovascular:     Rate and Rhythm: Regular rhythm. Tachycardia present.     Heart sounds: No murmur heard.   Pulmonary:     Effort: Pulmonary effort is normal. No respiratory distress.     Breath sounds: Normal breath sounds.  Abdominal:     General: There is no distension.     Palpations: Abdomen is soft.  Musculoskeletal:     Cervical back: Neck supple.  Skin:    General: Skin is warm and dry.     Capillary Refill: Capillary refill takes less than 2 seconds.  Neurological:     General: No focal deficit present.     Mental Status: She is lethargic.     GCS: GCS eye subscore is 4. GCS verbal subscore is 2. GCS motor  subscore is 4.     Patient withdraws all extremities to noxious stimuli.  Pupils equal round reactive to light.  Patient moans and jaw thrust but does not otherwise participate in neuro exam.  No obvious trauma to the face scalp head neck extremities and abdomen are pelvis.  No clonus or nystagmus.  Patient is not diaphoretic. ____________________________________________   LABS (all labs ordered are listed, but only abnormal results are displayed)  Labs Reviewed  COMPREHENSIVE METABOLIC PANEL - Abnormal; Notable for the following components:      Result Value   Potassium 3.4 (*)    Total Protein 8.2 (*)    All other components within normal limits  URINE DRUG SCREEN, QUALITATIVE ([facility] ONLY) - Abnormal; Notable for the following components:   Tricyclic, Ur Screen POSITIVE (*)    All other components within normal limits  BLOOD GAS, VENOUS - Abnormal; Notable for the following components:   pCO2, Ven 36 (*)    All other components within normal limits  SALICYLATE LEVEL - Abnormal; Notable for the following components:   Salicylate Lvl <9.5 (*)    All other components within normal limits  ACETAMINOPHEN LEVEL - Abnormal; Notable for the following components:   Acetaminophen (Tylenol), Serum <10 (*)    All other components within normal limits  RESP PANEL BY RT-PCR (FLU A&B, COVID) ARPGX2  CSF CULTURE W GRAM STAIN  CULTURE, BLOOD (ROUTINE X 2)  CULTURE, BLOOD (ROUTINE X 2)  CBC WITH DIFFERENTIAL/PLATELET  AMMONIA  TSH  ETHANOL  BRAIN NATRIURETIC PEPTIDE  CSF CELL COUNT WITH DIFFERENTIAL  PROTEIN AND GLUCOSE, CSF  TRIGLYCERIDES  CBC  BASIC METABOLIC PANEL  MAGNESIUM  PHOSPHORUS  BLOOD GAS, ARTERIAL  PROCALCITONIN  AMMONIA  CK  MAGNESIUM  PHOSPHORUS  URINALYSIS, ROUTINE W REFLEX MICROSCOPIC  HIV ANTIBODY (ROUTINE TESTING W REFLEX)   ____________________________________________  EKG  Sinus tachycardia with ventricular rate of 103, normal axis, unremarkable intervals  no clear evidence of acute ischemia or other significant underlying arrhythmia. ____________________________________________  RADIOLOGY  ED MD interpretation: CT head is unremarkable.  Initial chest x-ray remarkable for cardiomegaly with some mild atelectasis no pneumothorax large effusion overt edema or full consolidation.  Post intubation chest x-ray shows ET tube in appropriate position 2.2 cm above carina.  Official radiology report(s): CT Head Wo Contrast  Result Date: 03/26/2021 CLINICAL DATA:  Change in mental status.  Unknown cause. EXAM: CT HEAD WITHOUT CONTRAST TECHNIQUE: Contiguous axial images were obtained from the base of the skull through the vertex without intravenous contrast. COMPARISON:  MRI brain 03/21/2021.  CT head 03/21/2021 FINDINGS: Brain: No evidence of acute infarction, hemorrhage, hydrocephalus, extra-axial collection or mass lesion/mass effect. Vascular: No hyperdense vessel or unexpected calcification. Skull: Calvarium appears intact. Sinuses/Orbits: Paranasal sinuses and mastoid air cells are clear. Other: Congenital nonunion of the posterior arch of C1. IMPRESSION: No acute intracranial abnormalities. Electronically Signed   By: Lucienne Capers M.D.   On: 03/26/2021 19:08   DG Chest Portable 1 View  Result Date: 03/26/2021 CLINICAL DATA:  Intubation, placement of or of gastric tube, altered mental status EXAM: PORTABLE CHEST 1 VIEW COMPARISON:  Portable exam 1948 hours compared to 1803 hours FINDINGS: Tip of endotracheal tube projects 2.2 cm above carina. Orogastric tube extends into stomach. Enlargement of cardiac silhouette with vascular congestion. Mild RIGHT basilar atelectasis. Remaining lungs clear. No pleural effusion or pneumothorax. IMPRESSION: Mild RIGHT basilar atelectasis. Tube positions as above. Electronically Signed   By: Lavonia Dana M.D.   On: 03/26/2021 20:17   DG Chest Portable 1 View  Result Date: 03/26/2021 CLINICAL DATA:  Altered mental status,  hypertension, diabetes mellitus EXAM: PORTABLE CHEST 1 VIEW COMPARISON:  Portable exam 1812 hours compared to 03/22/2021 FINDINGS: Enlargement of cardiac silhouette with pulmonary vascular congestion. Mediastinal contour stable. Decreased lung volumes with minimal bibasilar atelectasis. No acute infiltrate, pleural effusion, or pneumothorax. IMPRESSION: Enlargement of cardiac silhouette. Low lung volumes with bibasilar atelectasis. Electronically Signed   By: Lavonia Dana M.D.   On: 03/26/2021 18:41   DG Abd Portable 1 View  Result Date: 03/26/2021 CLINICAL DATA:  Orogastric tube placement EXAM: PORTABLE ABDOMEN - 1 VIEW COMPARISON:  Portable exam 1949 hours without priors for comparison FINDINGS: Orogastric tube coiled in proximal stomach. Visualized bowel gas pattern normal. Prior lumbar fusion. Borderline enlargement of cardiac silhouette. Surgical clips RIGHT upper quadrant likely reflect prior cholecystectomy. IMPRESSION: Nasogastric tube coiled in proximal stomach. Electronically Signed   By: Lavonia Dana M.D.   On: 03/26/2021 20:16    ____________________________________________   PROCEDURES  Procedure(s) performed (including Critical Care):  Procedure Name: Intubation Date/Time: 03/26/2021 9:28 PM Performed by: Lucrezia Starch, MD Pre-anesthesia Checklist: Patient identified, Patient being monitored, Timeout performed, Emergency Drugs available and Suction available Oxygen Delivery Method: Ambu bag Preoxygenation: Pre-oxygenation with 100% oxygen Induction Type: Rapid sequence Laryngoscope Size: 4 Tube size: 7.5 mm Number of attempts: 2 Airway Equipment and Method: Patient positioned with wedge pillow,  Rigid stylet and Video-laryngoscopy Placement Confirmation: ETT inserted through vocal cords under direct vision and Positive ETCO2  Tube secured with: ETT holder Difficulty Due To: Difficult Airway- due to anterior larynx, Difficult Airway- due to limited oral opening and Difficulty was  unanticipated Future Recommendations: Recommend- induction with short-acting agent, and alternative techniques readily available    .Lumbar Puncture  Date/Time: 03/26/2021 9:29 PM Performed by: Lucrezia Starch, MD Authorized by: Lucrezia Starch, MD   Consent:    Consent obtained:  Written   Consent given by:  Healthcare agent   Risks, benefits, and alternatives were discussed: yes     Risks discussed:  Infection, bleeding, headache, nerve damage, pain and repeat procedure   Alternatives discussed:  No treatment Universal protocol:    Procedure explained and questions answered to patient or proxy's satisfaction: yes     Patient identity confirmed:  Arm band and provided demographic data Pre-procedure details:    Procedure purpose:  Diagnostic   Preparation: Patient was prepped and draped in usual sterile fashion   Anesthesia:    Anesthesia method:  Local infiltration   Local anesthetic:  Lidocaine 1% w/o epi Procedure details:    Lumbar space:  L3-L4 interspace   Patient position:  L lateral decubitus   Needle gauge:  18   Needle length (in):  3.5   Ultrasound guidance: no     Number of attempts:  3   Total volume (ml):  0 Post-procedure details:    Puncture site:  Adhesive bandage applied and direct pressure applied   Procedure completion:  Procedure terminated electively by provider .Critical Care Performed by: Lucrezia Starch, MD Authorized by: Lucrezia Starch, MD   Critical care provider statement:    Critical care time (minutes):  75   Critical care time was exclusive of:  Separately billable procedures and treating other patients   Critical care was necessary to treat or prevent imminent or life-threatening deterioration of the following conditions:  Respiratory failure and CNS failure or compromise   Critical care was time spent personally by me on the following activities:  Discussions with consultants, evaluation of patient's response to treatment, examination  of patient, ordering and performing treatments and interventions, ordering and review of laboratory studies, ordering and review of radiographic studies, pulse oximetry, re-evaluation of patient's condition, obtaining history from patient or surrogate and review of old charts     ____________________________________________   INITIAL IMPRESSION / [place] / ED COURSE      Patient presents with above-stated exam for assessment of altered mental status noted by family earlier today.  On arrival patient is a tachycardic with otherwise stable vital signs on room air.  She withdraws all extremities to noxious stimuli and her pupils are symmetric and reactive to light but she does not otherwise participate in neurological exam.  She was found in bed and there is no other historical or exam factors to suggest acute traumatic injury.  Differential is quite broad and includes toxic, metabolic, acute infectious process including encephalitis as patient was recently complaining of hallucinations, and acute endocrine disorder.  She does not appear hypoxic on arrival and VBG shows a pH of 7.43 with a PCO2 of 36 and no evidence of acute hypercarbic respiratory failure.  ECG shows no clear ischemia or significant arrhythmia.  CT head is unremarkable.  Intubation chest x-ray is unremarkable.  CBC WNL.  CMP shows no significant electrolyte or metabolic derangements aside from a K of 3.4 which would not otherwise explain patient's presentation today.  TSH and ammonia unremarkable.  Serum acetaminophen, salicylate level and  ethanol unremarkable.  BMP unremarkable.  UDS positive for TCAs but otherwise unremarkable.  Overall unclear etiology of patient's symptoms.  Given depressed mental status and will impact airway she was intubated.  Attempted to obtain CSF from [facility] was unsuccessful x3 attempts and will defer to IR.  However given concern for possible encephalitis or meningitis will cover with  Rocephin Vanco and the dose of acyclovir.  Patient will be admitted to medical ICU for further evaluation management.   ____________________________________________   FINAL CLINICAL IMPRESSION(S) / ED DIAGNOSES  Final diagnoses:  None    Medications  fentaNYL (SUBLIMAZE) injection 100 mcg (has no administration in time range)  fentaNYL (SUBLIMAZE) injection 100 mcg (has no administration in time range)  propofol (DIPRIVAN) 1000 MG/100ML infusion (10 mcg/kg/min  144.2 kg Intravenous New Bag/Given 03/26/21 1959)  cefTRIAXone (ROCEPHIN) 2 g in sodium chloride 0.9 % 100 mL IVPB (2 g Intravenous New Bag/Given 03/26/21 2107)  docusate sodium (COLACE) capsule 100 mg (has no administration in time range)  polyethylene glycol (MIRALAX / GLYCOLAX) packet 17 g (has no administration in time range)  ondansetron (ZOFRAN) injection 4 mg (has no administration in time range)  dexamethasone (DECADRON) injection 22 mg (has no administration in time range)  cholecalciferol (VITAMIN D3) tablet 1,000 Units (has no administration in time range)  ferrous sulfate tablet 325 mg (has no administration in time range)  rosuvastatin (CRESTOR) tablet 5 mg (has no administration in time range)  ascorbic acid (VITAMIN C) tablet 500 mg (has no administration in time range)  aspirin chewable tablet 81 mg (has no administration in time range)  famotidine (PEPCID) IVPB 20 mg premix (has no administration in time range)  losartan (COZAAR) tablet 100 mg (has no administration in time range)  vancomycin (VANCOREADY) IVPB 2000 mg/400 mL (has no administration in time range)    Followed by  vancomycin (VANCOREADY) IVPB 500 mg/100 mL (has no administration in time range)  acyclovir (ZOVIRAX) 935 mg in dextrose 5 % 150 mL IVPB (has no administration in time range)  etomidate (AMIDATE) injection (30 mg Intravenous Given 03/26/21 1939)  rocuronium (ZEMURON) injection (120 mg Intravenous Given 03/26/21 1941)  midazolam (VERSED) 5  MG/5ML injection (2 mg Intravenous Given 03/26/21 1951)     ED Discharge Orders    None       Note:  This document was prepared using Dragon voice recognition software and may include unintentional dictation errors.   Lucrezia Starch, MD 03/26/21 2133

## 2021-03-27 ENCOUNTER — Inpatient Hospital Stay: Payer: Medicare HMO

## 2021-03-27 ENCOUNTER — Other Ambulatory Visit: Payer: Self-pay

## 2021-03-27 ENCOUNTER — Other Ambulatory Visit: Payer: Medicare HMO

## 2021-03-27 DIAGNOSIS — G9340 Encephalopathy, unspecified: Secondary | ICD-10-CM | POA: Diagnosis not present

## 2021-03-27 DIAGNOSIS — J9601 Acute respiratory failure with hypoxia: Secondary | ICD-10-CM | POA: Diagnosis not present

## 2021-03-27 LAB — URINALYSIS, ROUTINE W REFLEX MICROSCOPIC
Bacteria, UA: NONE SEEN
Bilirubin Urine: NEGATIVE
Glucose, UA: NEGATIVE mg/dL
Ketones, ur: 5 mg/dL — AB
Leukocytes,Ua: NEGATIVE
Nitrite: NEGATIVE
Protein, ur: NEGATIVE mg/dL
Specific Gravity, Urine: 1.002 — ABNORMAL LOW (ref 1.005–1.030)
Squamous Epithelial / HPF: NONE SEEN (ref 0–5)
pH: 7 (ref 5.0–8.0)

## 2021-03-27 LAB — CSF CELL COUNT WITH DIFFERENTIAL
Eosinophils, CSF: 0 %
Lymphs, CSF: 8 %
Monocyte-Macrophage-Spinal Fluid: 92 %
RBC Count, CSF: 82 /mm3 — ABNORMAL HIGH (ref 0–3)
Segmented Neutrophils-CSF: 0 %
Tube #: 1
WBC, CSF: 5 /mm3 (ref 0–5)

## 2021-03-27 LAB — BASIC METABOLIC PANEL
Anion gap: 16 — ABNORMAL HIGH (ref 5–15)
BUN: 7 mg/dL — ABNORMAL LOW (ref 8–23)
CO2: 21 mmol/L — ABNORMAL LOW (ref 22–32)
Calcium: 9.6 mg/dL (ref 8.9–10.3)
Chloride: 104 mmol/L (ref 98–111)
Creatinine, Ser: 0.9 mg/dL (ref 0.44–1.00)
GFR, Estimated: >60 mL/min (ref >60)
Glucose, Bld: 118 mg/dL — ABNORMAL HIGH (ref 70–99)
Potassium: 3.6 mmol/L (ref 3.5–5.1)
Sodium: 141 mmol/L (ref 135–145)

## 2021-03-27 LAB — BLOOD GAS, ARTERIAL
Acid-base deficit: 3.4 mmol/L — ABNORMAL HIGH (ref 0.0–2.0)
Bicarbonate: 21.4 mmol/L (ref 20.0–28.0)
FIO2: 0.3
MECHVT: 450 mL
Mechanical Rate: 16
O2 Saturation: 98.1 %
PEEP: 5 cmH2O
Patient temperature: 37
pCO2 arterial: 37 mmHg (ref 32.0–48.0)
pH, Arterial: 7.37 (ref 7.350–7.450)
pO2, Arterial: 109 mmHg — ABNORMAL HIGH (ref 83.0–108.0)

## 2021-03-27 LAB — GLUCOSE, CAPILLARY: Glucose-Capillary: 99 mg/dL (ref 70–99)

## 2021-03-27 LAB — CBC
HCT: 40 % (ref 36.0–46.0)
Hemoglobin: 12.8 g/dL (ref 12.0–15.0)
MCH: 27.6 pg (ref 26.0–34.0)
MCHC: 32 g/dL (ref 30.0–36.0)
MCV: 86.2 fL (ref 80.0–100.0)
Platelets: 304 10*3/uL (ref 150–400)
RBC: 4.64 MIL/uL (ref 3.87–5.11)
RDW: 14 % (ref 11.5–15.5)
WBC: 15.3 10*3/uL — ABNORMAL HIGH (ref 4.0–10.5)
nRBC: 0 % (ref 0.0–0.2)

## 2021-03-27 LAB — MRSA PCR SCREENING: MRSA by PCR: NEGATIVE

## 2021-03-27 LAB — PROTEIN, CSF: Total  Protein, CSF: 39 mg/dL (ref 15–45)

## 2021-03-27 LAB — GLUCOSE, CSF: Glucose, CSF: 100 mg/dL — ABNORMAL HIGH (ref 40–70)

## 2021-03-27 LAB — TRIGLYCERIDES: Triglycerides: 81 mg/dL (ref <150)

## 2021-03-27 LAB — HIV ANTIBODY (ROUTINE TESTING W REFLEX): HIV Screen 4th Generation wRfx: NONREACTIVE

## 2021-03-27 LAB — PHOSPHORUS: Phosphorus: 3.4 mg/dL (ref 2.5–4.6)

## 2021-03-27 LAB — MAGNESIUM: Magnesium: 2.2 mg/dL (ref 1.7–2.4)

## 2021-03-27 MED ORDER — FREE WATER
30.0000 mL | Status: DC
  Administered 2021-03-27 – 2021-03-29 (×12): 30 mL

## 2021-03-27 MED ORDER — CHLORHEXIDINE GLUCONATE CLOTH 2 % EX PADS
6.0000 | MEDICATED_PAD | Freq: Every day | CUTANEOUS | Status: DC
  Administered 2021-03-27 – 2021-04-03 (×7): 6 via TOPICAL

## 2021-03-27 MED ORDER — VITAL HIGH PROTEIN PO LIQD
1000.0000 mL | ORAL | Status: DC
  Administered 2021-03-27: 1000 mL

## 2021-03-27 MED ORDER — ADULT MULTIVITAMIN LIQUID CH
15.0000 mL | Freq: Every day | ORAL | Status: DC
  Administered 2021-03-28 – 2021-03-29 (×2): 15 mL
  Filled 2021-03-27 (×3): qty 15

## 2021-03-27 MED ORDER — ENOXAPARIN SODIUM 80 MG/0.8ML ~~LOC~~ SOLN
0.5000 mg/kg | SUBCUTANEOUS | Status: DC
  Administered 2021-03-28 – 2021-04-06 (×9): 72.5 mg via SUBCUTANEOUS
  Filled 2021-03-27 (×11): qty 0.8

## 2021-03-27 MED ORDER — LIDOCAINE HCL (PF) 1 % IJ SOLN
5.0000 mL | Freq: Once | INTRAMUSCULAR | Status: DC
  Filled 2021-03-27: qty 5

## 2021-03-27 MED ORDER — VANCOMYCIN HCL 1500 MG/300ML IV SOLN
1500.0000 mg | Freq: Two times a day (BID) | INTRAVENOUS | Status: DC
  Filled 2021-03-27 (×2): qty 300

## 2021-03-27 MED ORDER — CHLORHEXIDINE GLUCONATE 0.12% ORAL RINSE (MEDLINE KIT)
15.0000 mL | Freq: Two times a day (BID) | OROMUCOSAL | Status: DC
  Administered 2021-03-27 – 2021-03-29 (×4): 15 mL via OROMUCOSAL

## 2021-03-27 MED ORDER — ORAL CARE MOUTH RINSE
15.0000 mL | OROMUCOSAL | Status: DC
  Administered 2021-03-27 – 2021-03-29 (×21): 15 mL via OROMUCOSAL

## 2021-03-27 MED ORDER — VANCOMYCIN HCL 500 MG/100ML IV SOLN
500.0000 mg | Freq: Once | INTRAVENOUS | Status: AC
  Administered 2021-03-27: 500 mg via INTRAVENOUS
  Filled 2021-03-27: qty 100

## 2021-03-27 MED ORDER — PROSOURCE TF PO LIQD
90.0000 mL | Freq: Four times a day (QID) | ORAL | Status: DC
  Administered 2021-03-27 – 2021-03-29 (×7): 90 mL
  Filled 2021-03-27 (×4): qty 90

## 2021-03-27 MED ORDER — VANCOMYCIN HCL 1000 MG/200ML IV SOLN
1000.0000 mg | Freq: Two times a day (BID) | INTRAVENOUS | Status: DC
  Administered 2021-03-27 – 2021-03-28 (×3): 1000 mg via INTRAVENOUS
  Filled 2021-03-27 (×5): qty 200

## 2021-03-27 NOTE — Consult Note (Signed)
PHARMACY CONSULT NOTE - FOLLOW UP  Pharmacy Consult for Electrolyte Monitoring and Replacement   Recent Labs: Potassium (mmol/L)  Date Value  03/27/2021 3.6  03/28/2014 3.8   Magnesium (mg/dL)  Date Value  03/27/2021 2.2   Calcium (mg/dL)  Date Value  03/27/2021 9.6   Calcium, Total (mg/dL)  Date Value  03/28/2014 8.4 (L)   Albumin (g/dL)  Date Value  03/26/2021 4.0   Phosphorus (mg/dL)  Date Value  03/27/2021 3.4   Sodium (mmol/L)  Date Value  03/27/2021 141  03/28/2014 139    Assessment: Megan Oneal is a 65 y.o. female admitted on 03/26/2021 with un-responsiveness and concern for meningitis. Vitals significant for hypothermia, hypertension, tachycardia. Labs on admission without leukocytosis, no left shift, PCT < 0.10. CT head with no intracranial abnormalities. Pharmacy has been consulted to monitor and replace electrolytes  Goal of Therapy:  Electrolytes WNL  Plan:  --No electrolyte replacement needed at this time  --Monitor electrolyte levels with AM labs  Benn Moulder, PharmD Pharmacy Resident  03/27/2021 12:18 PM

## 2021-03-27 NOTE — Consult Note (Signed)
Pharmacy Antibiotic Note  Megan Oneal is a 65 y.o. female admitted on 03/26/2021 with un-responsiveness and concern for meningitis. Vitals significant for hypothermia, hypertension, tachycardia. Labs on admission without leukocytosis, no left shift, PCT < 0.10. CT head with no intracranial abnormalities. Pharmacy has been consulted for vancomycin and acyclovir dosing. Patient is also ordered ceftriaxone and ampicillin.   WBC 15.3  Pt received Vancomycin 2500 mg IV LD x 1   Plan: Continue Acyclovir 935 mg (10 mg/kg AdjBW) IV q8h  Continue ampicillin 2g IV q4h  Continue ceftriaxone 2g IV q12h  Continue vancomycin from 1000 mg IV q12h ----Estimated AUC 518, Cmin 16.2, t 1/2 12.8 --Goal trough: 15-20 --Daily Scr per protocol --Levels at steady state as indicated  Height: 5' 5.98" (167.6 cm) Weight: (!) 142.8 kg (314 lb 13.1 oz) IBW/kg (Calculated) : 59.26  Temp (24hrs), Avg:97.6 F (36.4 C), Min:96.08 F (35.6 C), Max:98.96 F (37.2 C)  Recent Labs  Lab 03/21/21 1120 03/22/21 1816 03/26/21 1732 03/27/21 0516  WBC 10.2 11.6* 8.8 15.3*  CREATININE 0.90 0.94 0.97 0.90    Estimated Creatinine Clearance: 93.6 mL/min (by C-G formula based on SCr of 0.9 mg/dL).    Allergies  Allergen Reactions  . Metoprolol Other (See Comments)    Fatigue and malaise; wheezing  . Sulfa Antibiotics     Antimicrobials this admission: Ceftriaxone 4/6 >>  Ampicillin 4/6 >>  Vancomycin 4/6 >>  Acyclovir 4/6 >>   Dose adjustments this admission: N/A  Microbiology results: 4/6 BCx: NGTD 4/6 MRSA PCR: negative 4/6 CSF cxs: pending  Thank you for allowing pharmacy to be a part of this patient's care.  Benn Moulder, PharmD Pharmacy Resident  03/27/2021 12:16 PM

## 2021-03-27 NOTE — Progress Notes (Signed)
Patient transported to MRI. Patient transported to IR. Patient transported back to ICU room 19. No issues with any of the transports. Patient not in distress at this time.

## 2021-03-27 NOTE — Progress Notes (Signed)
Initial Nutrition Assessment  DOCUMENTATION CODES:   Morbid obesity  INTERVENTION:   Initiate Vital HP @20ml /hr + ProSource TF 12ml QID via tube   Propofol: 38.9 ml/hr- provides 1027kcal/day  Free water flushes 70ml q4 hours to maintain tube patency   Regimen provides 1827kcal/day, 130g/day protein and 565ml/day free water   Liquid MVI daily via tube   Pt at high refeed risk; recommend monitor potassium, magnesium and phosphorus labs daily until stable  NUTRITION DIAGNOSIS:   Inadequate oral intake related to inability to eat (pt sedated and ventilated) as evidenced by NPO status.  GOAL:   Provide needs based on ASPEN/SCCM guidelines  MONITOR:   Vent status,Labs,Weight trends,Skin,TF tolerance,I & O's  REASON FOR ASSESSMENT:   Ventilator    ASSESSMENT:   65 y/o female with h/o anxiety, depression, endometriosis, HTN, DJD and DM who is admitted with unresponsiveness  Pt sedated and ventilated. OGT in place. Plan is to start tube feeds today. Per chart, pt is down 23lbs(7%) over the past 3 months; this is not significant.   Medications reviewed and include: vitamin C, aspirin, D3, dexamethasone, lovenox, ferrous sulfate, omnipen, ceftriaxone, pepcid, LRS @75ml /hr, propofol, vancomycin   Labs reviewed: K 3.6 wnl, BUN 7(L), P 3.4 wnl, Mg 2.2 wnl Wbc- 15.3(H)  Patient is currently intubated on ventilator support MV: 10.6 L/min Temp (24hrs), Avg:97.6 F (36.4 C), Min:96.08 F (35.6 C), Max:98.96 F (37.2 C)  Propofol: 38.9 ml/hr- provides 1027kcal/day   MAP- >41mmHg  UOP- 3458ml/day   NUTRITION - FOCUSED PHYSICAL EXAM:  Flowsheet Row Most Recent Value  Orbital Region No depletion  Upper Arm Region No depletion  Thoracic and Lumbar Region No depletion  Buccal Region No depletion  Temple Region No depletion  Clavicle Bone Region No depletion  Clavicle and Acromion Bone Region No depletion  Scapular Bone Region No depletion  Dorsal Hand No depletion   Patellar Region No depletion  Anterior Thigh Region No depletion  Posterior Calf Region No depletion  Edema (RD Assessment) Mild  Hair Reviewed  Eyes Reviewed  Mouth Reviewed  Skin Reviewed  Nails Reviewed     Diet Order:   Diet Order            Diet NPO time specified  Diet effective now                EDUCATION NEEDS:   No education needs have been identified at this time  Skin:  Skin Assessment: Reviewed RN Assessment  Last BM:  pta  Height:   Ht Readings from Last 1 Encounters:  03/26/21 5' 5.98" (1.676 m)    Weight:   Wt Readings from Last 1 Encounters:  03/27/21 (!) 142.8 kg    Ideal Body Weight:  59 kg  BMI:  Body mass index is 50.84 kg/m.  Estimated Nutritional Needs:   Kcal:  1300-1500kcal/day  Protein:  120-150g/day  Fluid:  1.8-2.1L/day  Koleen Distance MS, RD, LDN Please refer to [facility] for RD and/or RD on-call/weekend/after hours pager

## 2021-03-27 NOTE — Progress Notes (Signed)
Portable EEG completed, results pending. 

## 2021-03-27 NOTE — Progress Notes (Signed)
GOALS OF CARE DISCUSSION  The Clinical status was relayed to family in detail. Sister at Bedside  Updated and notified of patients medical condition.  Patient remains unresponsive and will not open eyes to command.    Patient is having a weak cough and struggling to remove secretions.   Patient with increased WOB and using accessory muscles to breathe Explained to family course of therapy and the modalities     Patient with Progressive multiorgan failure with a very high probablity of a very minimal chance of meaningful recovery despite all aggressive and optimal medical therapy.   PATIENT REMAINS FULL CODE  Family understands the situation. Plan for MRI and LP today   Family are satisfied with Plan of action and management. All questions answered  Additional CC time 35 mins   Takyia Sindt Patricia Pesa, M.D.  [facility]  Medical Director [place] Director [facility] Cardio-Pulmonary Department

## 2021-03-27 NOTE — Progress Notes (Signed)
Pt stable after lp.Back stable.F/U with her MD.

## 2021-03-27 NOTE — Procedures (Signed)
Routine EEG 20 minute  History: 65 yo F admitted with hypoxic respiratory failure and AMS with possible meningitis.   Technique: This is a technically satisfactory eighteen channel recording using the standard 10-20 system of electrode placement. There were no significant technical difficulties.   Report: The predominant background is intermixed 6-7 hz activity with underlying low theta activity with moderate amplitude, symmetric, and rhythmic activity that is reactive to eye opening. Drowsiness is characterized by low amplitude mixed frequency activity. Focal slowing is not noted.  Asymmetry is not noted. Abnormal discharges and seizures are not seen.   Impression: This is an abnormal awake and drowsy EEG. Mild diffuse slowing is consistent with generalized neuronal dysfunction. No abnormal discharges or seizures are noted.

## 2021-03-27 NOTE — Consult Note (Signed)
Infectious Disease     Reason for Consult: AMS   Referring Physician: Dr Mortimer Fries  Date of Admission:  03/26/2021   Active Problems:   Acute encephalopathy   HPI: Megan Oneal is a 65 y.o. female admitted to the ICU on April 6 with severe acute hypoxic respiratory failure and altered mental status.  She has a hx of depression/anxiety, THN, DM2, DDD.   She was found unresponsive by family members at 6:30 in the morning on April 6.  They did find a bottle of Flexeril near her.  She did have a recent ER visit a few days prior to April 4 with auditory hallucinations ongoing for several months.  She was started on Risperdal at that time this was to follow-up with PCP.   per report she also do fever to 101 at home day prior to admission. Patient required intubation in the emergency room.  She was admitted to the ICU.  Since admission she has remained unresponsive. On admission her white count was 8.8 but is increased to 15.  She was started on acyclovir, ampicillin, ceftriaxone and vancomycin.  Blood cultures have been done and are no growth to date.  Lumbar puncture was attempted but unsuccessful in the ER.  She had a normal pro calcitonin less than 0.15, HIV test is negative.  CT of the head was unremarkable Covid PCR was negative urinalysis was negative for signs of infection.  LFTs were normal TSH was normal salicylate level was negative ammonia level was normal ethanol was negative acetaminophen was negative. MRI is negative without contrast. chest x-ray showed mild right basilar atelectasis.  Abdominal x-ray was negative   Past Medical History:  Diagnosis Date  . Anxiety 03/12/2013  . Degenerative disc disease, lumbar    Epidural injections in 2007  . Diabetes mellitus without complication ([place])   . Endometriosis   . Frequent PVCs    12,000/24 hours  . Hypertension   . Otalgia, unspecified   . Palpitations 10/04/2010  . Vascular bruit 01/2013   Right neck   Past Surgical History:  Procedure  Laterality Date  . BREAST SURGERY     benign tumor excised-age 89  . CARDIOVASCULAR STRESS TEST  03/13/2008   No scintigraphic evidence of inducible myocardial ischemia, EKG negative for ischemia, no ECG changes.  . COLONOSCOPY  2009  . COLONOSCOPY N/A 09/21/2019   Procedure: COLONOSCOPY;  Surgeon: Rogene Houston, MD;  [facility];  Service: Endoscopy;  Laterality: N/A;  2:00-10:30  . CYST EXCISION  2007   Scalp  . DOPPLER ECHOCARDIOGRAPHY  03/13/2008   EF >93%, LV systolic function is normal, LV wall function is normal  . LAPAROSCOPIC CHOLECYSTECTOMY  2010  . LUMBAR LAMINECTOMY  2009    L4-L5 and L5-S1 laminectomy with diskectomy; transforaminal  . POLYPECTOMY  09/21/2019   Procedure: POLYPECTOMY;  Surgeon: Rogene Houston, MD;  Location: [facility];  Service: Endoscopy;;  . REPLACEMENT TOTAL KNEE    . SLEEP STUDY  10/29/2009   AHI-1.8/hr, during REM 6.4/hr; RDI-8.0/hr, during REM 15.0/hr; avg oxygen during REM and NREM was 94%  . TOTAL KNEE ARTHROPLASTY Bilateral   . TUBAL LIGATION     Social History   Tobacco Use  . Smoking status: Former Smoker    Packs/day: 2.00    Years: 20.00    Pack years: 40.00    Types: Cigarettes    Start date: 08/03/1972    Quit date: 08/03/1996    Years since quitting:  24.6  . Smokeless tobacco: Former Systems developer    Quit date: 12/21/1994  Substance Use Topics  . Alcohol use: No  . Drug use: No   Family History  Problem Relation Age of Onset  . Diabetes Mother   . Hypertension Mother   . Diabetes Brother   . Hypertension Brother   . Hypertension Father     Allergies:  Allergies  Allergen Reactions  . Metoprolol Other (See Comments)    Fatigue and malaise; wheezing  . Sulfa Antibiotics     Current antibiotics: Antibiotics Given (last 72 hours)    Date/Time Action Medication Dose Rate   03/26/21 2107 New Bag/Given   cefTRIAXone (ROCEPHIN) 2 g in sodium chloride 0.9 % 100 mL IVPB 2 g 200 mL/hr   03/26/21 2300 New Bag/Given    ampicillin (OMNIPEN) 2 g in sodium chloride 0.9 % 100 mL IVPB 2 g 300 mL/hr   03/27/21 0002 New Bag/Given  [waiting for med from pharmacy]   acyclovir (ZOVIRAX) 935 mg in dextrose 5 % 150 mL IVPB 935 mg 168.7 mL/hr   03/27/21 0033 New Bag/Given  [waiting on med from pharmacy]   vancomycin (VANCOREADY) IVPB 2000 mg/400 mL 2,000 mg 200 mL/hr   03/27/21 0322 New Bag/Given   ampicillin (OMNIPEN) 2 g in sodium chloride 0.9 % 100 mL IVPB 2 g 300 mL/hr   03/27/21 0323 New Bag/Given   vancomycin (VANCOREADY) IVPB 500 mg/100 mL 500 mg 100 mL/hr   03/27/21 0921 New Bag/Given   ampicillin (OMNIPEN) 2 g in sodium chloride 0.9 % 100 mL IVPB 2 g 300 mL/hr   03/27/21 0923 New Bag/Given   acyclovir (ZOVIRAX) 935 mg in dextrose 5 % 150 mL IVPB 935 mg 168.7 mL/hr   03/27/21 0926 New Bag/Given   cefTRIAXone (ROCEPHIN) 2 g in sodium chloride 0.9 % 100 mL IVPB 2 g 200 mL/hr   03/27/21 1250 New Bag/Given   vancomycin (VANCOREADY) IVPB 1000 mg/200 mL 1,000 mg 200 mL/hr   03/27/21 1251 New Bag/Given   acyclovir (ZOVIRAX) 935 mg in dextrose 5 % 150 mL IVPB 935 mg 168.7 mL/hr   03/27/21 1253 New Bag/Given   ampicillin (OMNIPEN) 2 g in sodium chloride 0.9 % 100 mL IVPB 2 g 300 mL/hr      MEDICATIONS: . vitamin C  500 mg Per Tube Daily  . aspirin  81 mg Per Tube Daily  . chlorhexidine gluconate (MEDLINE KIT)  15 mL Mouth Rinse BID  . Chlorhexidine Gluconate Cloth  6 each Topical Daily  . cholecalciferol  1,000 Units Oral Daily  . dexamethasone (DECADRON) injection  0.15 mg/kg Intravenous Q6H  . [START ON 03/28/2021] enoxaparin (LOVENOX) injection  0.5 mg/kg Subcutaneous Q24H  . ferrous sulfate  325 mg Oral Q breakfast  . losartan  100 mg Per Tube Daily  . mouth rinse  15 mL Mouth Rinse 10 times per day  . rosuvastatin  5 mg Per Tube Daily    Review of Systems - unable to obtain  OBJECTIVE: Temp:  [96.08 F (35.6 C)-98.96 F (37.2 C)] 98.96 F (37.2 C) (04/07 0800) Pulse Rate:  [96-121] 101 (04/07  1100) Resp:  [13-31] 16 (04/07 1100) BP: (93-184)/(57-129) 134/73 (04/07 1100) SpO2:  [95 %-100 %] 100 % (04/07 1100) FiO2 (%):  [30 %-65 %] 30 % (04/07 0809) Weight:  [142.8 kg-144.2 kg] 142.8 kg (04/07 0500) Physical Exam  Constitutional:  Intubated, sedated.  HENT: [place]/AT, PERRLA, no scleral icterus Mouth/Throat: ETT  Cardiovascular: Normal rate, regular  rhythm and normal heart sounds. Exam reveals no gallop and no friction rub.  No murmur heard.  Pulmonary/Chest: Effort normal and breath sounds normal. No respiratory distress.  has no wheezes.  Neck = supple, no nuchal rigidity Abdominal: Soft. Bowel sounds are normal.  exhibits no distension. There is no tenderness.  Lymphadenopathy: no cervical adenopathy. No axillary adenopathy Neurological: unresponsive Skin: Skin is warm and dry. No rash noted. No erythema.  Marland Kitchen   LABS: Results for orders placed or performed during the hospital encounter of 03/26/21 (from the past 48 hour(s))  Resp Panel by RT-PCR (Flu A&B, Covid) Nasopharyngeal Swab     Status: None   Collection Time: 03/26/21  5:26 PM   Specimen: Nasopharyngeal Swab; Nasopharyngeal(NP) swabs in vial transport medium  Result Value Ref Range   SARS Coronavirus 2 by RT PCR NEGATIVE NEGATIVE    Comment: (NOTE) SARS-CoV-2 target nucleic acids are NOT DETECTED.  The SARS-CoV-2 RNA is generally detectable in upper respiratory specimens during the acute phase of infection. The lowest concentration of SARS-CoV-2 viral copies this assay can detect is 138 copies/mL. A negative result does not preclude SARS-Cov-2 infection and should not be used as the sole basis for treatment or other patient management decisions. A negative result may occur with  improper specimen collection/handling, submission of specimen other than nasopharyngeal swab, presence of viral mutation(s) within the areas targeted by this assay, and inadequate number of viral copies(<138 copies/mL). A negative result  must be combined with clinical observations, patient history, and epidemiological information. The expected result is Negative.  Fact Sheet for Patients:  EntrepreneurPulse.com.au  Fact Sheet for Healthcare Providers:  IncredibleEmployment.be  This test is no t yet approved or cleared by the [place] FDA and  has been authorized for detection and/or diagnosis of SARS-CoV-2 by FDA under an Emergency Use Authorization (EUA). This EUA will remain  in effect (meaning this test can be used) for the duration of the COVID-19 declaration under Section 564(b)(1) of the Act, 21 U.S.C.section 360bbb-3(b)(1), unless the authorization is terminated  or revoked sooner.       Influenza A by PCR NEGATIVE NEGATIVE   Influenza B by PCR NEGATIVE NEGATIVE    Comment: (NOTE) The Xpert Xpress SARS-CoV-2/FLU/RSV plus assay is intended as an aid in the diagnosis of influenza from Nasopharyngeal swab specimens and should not be used as a sole basis for treatment. Nasal washings and aspirates are unacceptable for Xpert Xpress SARS-CoV-2/FLU/RSV testing.  Fact Sheet for Patients: EntrepreneurPulse.com.au  Fact Sheet for Healthcare Providers: IncredibleEmployment.be  This test is not yet approved or cleared by the [place] FDA and has been authorized for detection and/or diagnosis of SARS-CoV-2 by FDA under an Emergency Use Authorization (EUA). This EUA will remain in effect (meaning this test can be used) for the duration of the COVID-19 declaration under Section 564(b)(1) of the Act, 21 U.S.C. section 360bbb-3(b)(1), unless the authorization is terminated or revoked.  Performed at [facility], [address] [place], [place] [address]   Urine Drug Screen, Qualitative [facility] only)     Status: Abnormal   Collection Time: 03/26/21  5:26 PM  Result Value Ref Range   Tricyclic, Ur Screen POSITIVE (A) NONE  DETECTED   Amphetamines, Ur Screen NONE DETECTED NONE DETECTED   MDMA (Ecstasy)Ur Screen NONE DETECTED NONE DETECTED   Cocaine Metabolite,Ur [place] NONE DETECTED NONE DETECTED   Opiate, Ur Screen NONE DETECTED NONE DETECTED   Phencyclidine (PCP) Ur S NONE DETECTED NONE DETECTED  Cannabinoid 50 Ng, Ur [place] NONE DETECTED NONE DETECTED   Barbiturates, Ur Screen NONE DETECTED NONE DETECTED   Benzodiazepine, Ur Scrn NONE DETECTED NONE DETECTED   Methadone Scn, Ur NONE DETECTED NONE DETECTED    Comment: (NOTE) Tricyclics + metabolites, urine    Cutoff 1000 ng/mL Amphetamines + metabolites, urine  Cutoff 1000 ng/mL MDMA (Ecstasy), urine              Cutoff 500 ng/mL Cocaine Metabolite, urine          Cutoff 300 ng/mL Opiate + metabolites, urine        Cutoff 300 ng/mL Phencyclidine (PCP), urine         Cutoff 25 ng/mL Cannabinoid, urine                 Cutoff 50 ng/mL Barbiturates + metabolites, urine  Cutoff 200 ng/mL Benzodiazepine, urine              Cutoff 200 ng/mL Methadone, urine                   Cutoff 300 ng/mL  The urine drug screen provides only a preliminary, unconfirmed analytical test result and should not be used for non-medical purposes. Clinical consideration and professional judgment should be applied to any positive drug screen result due to possible interfering substances. A more specific alternate chemical method must be used in order to obtain a confirmed analytical result. Gas chromatography / mass spectrometry (GC/MS) is the preferred confirm atory method. Performed at [facility], [address] [place], [place] [address]   Blood gas, venous     Status: Abnormal   Collection Time: 03/26/21  5:26 PM  Result Value Ref Range   pH, Ven 7.43 7.250 - 7.430   pCO2, Ven 36 (L) 44.0 - 60.0 mmHg   pO2, Ven 40.0 32.0 - 45.0 mmHg   Bicarbonate 23.9 20.0 - 28.0 mmol/L   Acid-base deficit 0.1 0.0 - 2.0 mmol/L   O2 Saturation 76.7 %   Patient temperature 37.0     Collection site VEIN    Sample type VENIPUNCTURE     Comment: Performed at [facility], [address] [place], [place] [address]  Urinalysis, Routine w reflex microscopic     Status: Abnormal   Collection Time: 03/26/21  5:26 PM  Result Value Ref Range   Color, Urine COLORLESS (A) YELLOW   APPearance CLEAR (A) CLEAR   Specific Gravity, Urine 1.002 (L) 1.005 - 1.030   pH 7.0 5.0 - 8.0   Glucose, UA NEGATIVE NEGATIVE mg/dL   Hgb urine dipstick SMALL (A) NEGATIVE   Bilirubin Urine NEGATIVE NEGATIVE   Ketones, ur 5 (A) NEGATIVE mg/dL   Protein, ur NEGATIVE NEGATIVE mg/dL   Nitrite NEGATIVE NEGATIVE   Leukocytes,Ua NEGATIVE NEGATIVE   RBC / HPF 0-5 0 - 5 RBC/hpf   WBC, UA 0-5 0 - 5 WBC/hpf   Bacteria, UA NONE SEEN NONE SEEN   Squamous Epithelial / LPF NONE SEEN 0 - 5    Comment: Performed at [facility], [address] [place], [place] [address]  CBC with Differential     Status: None   Collection Time: 03/26/21  5:32 PM  Result Value Ref Range   WBC 8.8 4.0 - 10.5 K/uL   RBC 4.52 3.87 - 5.11 MIL/uL   Hemoglobin 12.3 12.0 - 15.0 g/dL   HCT 38.4 36.0 - 46.0 %   MCV 85.0 80.0 - 100.0 fL  MCH 27.2 26.0 - 34.0 pg   MCHC 32.0 30.0 - 36.0 g/dL   RDW 13.8 11.5 - 15.5 %   Platelets 290 150 - 400 K/uL   nRBC 0.0 0.0 - 0.2 %   Neutrophils Relative % 62 %   Neutro Abs 5.6 1.7 - 7.7 K/uL   Lymphocytes Relative 25 %   Lymphs Abs 2.2 0.7 - 4.0 K/uL   Monocytes Relative 8 %   Monocytes Absolute 0.7 0.1 - 1.0 K/uL   Eosinophils Relative 3 %   Eosinophils Absolute 0.2 0.0 - 0.5 K/uL   Basophils Relative 1 %   Basophils Absolute 0.0 0.0 - 0.1 K/uL   Immature Granulocytes 1 %   Abs Immature Granulocytes 0.04 0.00 - 0.07 K/uL    Comment: Performed at [facility], [address] [place], [place] [address]  Comprehensive metabolic panel     Status: Abnormal   Collection Time: 03/26/21  5:32 PM  Result Value Ref Range   Sodium 135 135 - 145 mmol/L    Potassium 3.4 (L) 3.5 - 5.1 mmol/L   Chloride 99 98 - 111 mmol/L   CO2 23 22 - 32 mmol/L   Glucose, Bld 92 70 - 99 mg/dL    Comment: Glucose reference range applies only to samples taken after fasting for at least 8 hours.   BUN 8 8 - 23 mg/dL   Creatinine, Ser 0.97 0.44 - 1.00 mg/dL   Calcium 9.7 8.9 - 10.3 mg/dL   Total Protein 8.2 (H) 6.5 - 8.1 g/dL   Albumin 4.0 3.5 - 5.0 g/dL   AST 28 15 - 41 U/L   ALT 22 0 - 44 U/L   Alkaline Phosphatase 71 38 - 126 U/L   Total Bilirubin 1.1 0.3 - 1.2 mg/dL   GFR, Estimated >60 >60 mL/min    Comment: (NOTE) Calculated using the CKD-EPI Creatinine Equation (2021)    Anion gap 13 5 - 15    Comment: Performed at [facility], [address] [place], [place] [address]  Ammonia     Status: None   Collection Time: 03/26/21  5:32 PM  Result Value Ref Range   Ammonia 10 9 - 35 umol/L    Comment: Performed at [facility], [address] [place], [place] [address]  TSH     Status: None   Collection Time: 03/26/21  5:32 PM  Result Value Ref Range   TSH 0.694 0.350 - 4.500 uIU/mL    Comment: Performed by a 3rd Generation assay with a functional sensitivity of <=0.01 uIU/mL. Performed at [facility], [address] [place], [place] [address]   Salicylate level     Status: Abnormal   Collection Time: 03/26/21  5:32 PM  Result Value Ref Range   Salicylate Lvl <9.0 (L) 7.0 - 30.0 mg/dL    Comment: Performed at [facility], [address] [place], [place] [address]  Ethanol     Status: None   Collection Time: 03/26/21  5:32 PM  Result Value Ref Range   Alcohol, Ethyl (B) <10 <10 mg/dL    Comment: (NOTE) Lowest detectable limit for serum alcohol is 10 mg/dL.  For medical purposes only. Performed at [facility], [address] [place],  [address]   Acetaminophen level     Status: Abnormal   Collection Time: 03/26/21  5:32 PM  Result Value Ref Range   Acetaminophen (Tylenol), Serum  <10 (L) 10 - 30 ug/mL  Comment: (NOTE) Therapeutic concentrations vary significantly. A range of 10-30 ug/mL  may be an effective concentration for many patients. However, some  are best treated at concentrations outside of this range. Acetaminophen concentrations >150 ug/mL at 4 hours after ingestion  and >50 ug/mL at 12 hours after ingestion are often associated with  toxic reactions.  Performed at [facility], [address] [place], [place] [address]   Brain natriuretic peptide     Status: None   Collection Time: 03/26/21  5:32 PM  Result Value Ref Range   B Natriuretic Peptide 22.6 0.0 - 100.0 pg/mL    Comment: Performed at [facility], [address] [place], [place] [address]  Blood culture (routine x 2)     Status: None (Preliminary result)   Collection Time: 03/26/21  9:09 PM   Specimen: BLOOD  Result Value Ref Range   Specimen Description BLOOD RIGHT ANTECUBITAL    Special Requests      BOTTLES DRAWN AEROBIC AND ANAEROBIC Blood Culture adequate volume   Culture      NO GROWTH < 12 HOURS Performed at [facility], [address], [place], [place] [address]    Report Status PENDING   Ammonia     Status: None   Collection Time: 03/26/21  9:09 PM  Result Value Ref Range   Ammonia 17 9 - 35 umol/L    Comment: Performed at [facility], [address] [place], [place] [address]  CK     Status: Abnormal   Collection Time: 03/26/21  9:09 PM  Result Value Ref Range   Total CK 416 (H) 38 - 234 U/L    Comment: Performed at [facility], [address], [place], [place] [address]  Magnesium     Status: None   Collection Time: 03/26/21  9:09 PM  Result Value Ref Range   Magnesium 2.1 1.7 - 2.4 mg/dL    Comment: Performed at [facility], [address] [place], [place] [address]  Phosphorus     Status: None   Collection Time: 03/26/21  9:09 PM  Result Value Ref Range   Phosphorus 4.5 2.5 - 4.6 mg/dL     Comment: Performed at [facility], [address] [place], [place] [address]  Glucose, capillary     Status: None   Collection Time: 03/26/21 10:01 PM  Result Value Ref Range   Glucose-Capillary 99 70 - 99 mg/dL    Comment: Glucose reference range applies only to samples taken after fasting for at least 8 hours.  Blood culture (routine x 2)     Status: None (Preliminary result)   Collection Time: 03/26/21 10:18 PM   Specimen: BLOOD  Result Value Ref Range   Specimen Description BLOOD BLOOD RIGHT HAND    Special Requests      BOTTLES DRAWN AEROBIC AND ANAEROBIC Blood Culture results may not be optimal due to an inadequate volume of blood received in culture bottles   Culture      NO GROWTH < 12 HOURS Performed at [facility], [address] [place], [place] [address]    Report Status PENDING   Procalcitonin     Status: None   Collection Time: 03/26/21 10:19 PM  Result Value Ref Range   Procalcitonin <0.10 ng/mL    Comment:        Interpretation: PCT (Procalcitonin) <= 0.5 ng/mL: Systemic infection (sepsis) is not likely. Local bacterial infection is possible. (NOTE)  Sepsis PCT Algorithm           Lower Respiratory Tract                                      Infection PCT Algorithm    ----------------------------     ----------------------------         PCT < 0.25 ng/mL                PCT < 0.10 ng/mL          Strongly encourage             Strongly discourage   discontinuation of antibiotics    initiation of antibiotics    ----------------------------     -----------------------------       PCT 0.25 - 0.50 ng/mL            PCT 0.10 - 0.25 ng/mL               OR       >80% decrease in PCT            Discourage initiation of                                            antibiotics      Encourage discontinuation           of antibiotics    ----------------------------     -----------------------------         PCT >= 0.50 ng/mL              PCT 0.26 -  0.50 ng/mL               AND        <80% decrease in PCT             Encourage initiation of                                             antibiotics       Encourage continuation           of antibiotics    ----------------------------     -----------------------------        PCT >= 0.50 ng/mL                  PCT > 0.50 ng/mL               AND         increase in PCT                  Strongly encourage                                      initiation of antibiotics    Strongly encourage escalation           of antibiotics                                     -----------------------------  PCT <= 0.25 ng/mL                                                 OR                                        > 80% decrease in PCT                                      Discontinue / Do not initiate                                             antibiotics  Performed at [facility], [address] [place], [place] [address]   Blood gas, arterial     Status: Abnormal   Collection Time: 03/26/21 10:45 PM  Result Value Ref Range   FIO2 0.50    Delivery systems VENTILATOR    Mode PRESSURE REGULATED VOLUME CONTROL    VT 450 mL   Peep/cpap 5.0 cm H20   pH, Arterial 7.37 7.350 - 7.450   pCO2 arterial 37 32.0 - 48.0 mmHg   pO2, Arterial 200 (H) 83.0 - 108.0 mmHg   Bicarbonate 21.4 20.0 - 28.0 mmol/L   Acid-base deficit 3.4 (H) 0.0 - 2.0 mmol/L   O2 Saturation 99.7 %   Patient temperature 37.0    Collection site RIGHT RADIAL    Sample type ARTERIAL DRAW    Allens test (pass/fail) PASS PASS   Mechanical Rate 16     Comment: Performed at [facility], [address] [place], [place] [address]  MRSA PCR Screening     Status: None   Collection Time: 03/26/21 10:56 PM   Specimen: Nasal Mucosa; Nasopharyngeal  Result Value Ref Range   MRSA by PCR NEGATIVE NEGATIVE    Comment:        The GeneXpert MRSA Assay (FDA approved for NASAL  specimens only), is one component of a comprehensive MRSA colonization surveillance program. It is not intended to diagnose MRSA infection nor to guide or monitor treatment for MRSA infections. Performed at [facility], [address] [place], [place] [address]   Blood gas, arterial     Status: Abnormal   Collection Time: 03/27/21  4:47 AM  Result Value Ref Range   FIO2 0.30    Delivery systems VENTILATOR    Mode PRESSURE REGULATED VOLUME CONTROL    VT 450 mL   Peep/cpap 5.0 cm H20   pH, Arterial 7.37 7.350 - 7.450   pCO2 arterial 37 32.0 - 48.0 mmHg   pO2, Arterial 109 (H) 83.0 - 108.0 mmHg   Bicarbonate 21.4 20.0 - 28.0 mmol/L   Acid-base deficit 3.4 (H) 0.0 - 2.0 mmol/L   O2 Saturation 98.1 %   Patient temperature 37.0    Collection site RIGHT RADIAL    Sample type ARTERIAL DRAW    Allens test (pass/fail) PASS PASS   Mechanical Rate 16     Comment: Performed at [facility], [address], [place], [place] [address]  Triglycerides     Status: None   Collection Time: 03/27/21  5:16 AM  Result Value Ref Range   Triglycerides 81 <150 mg/dL    Comment: Performed at [facility], [address] [place], [place] [address]  CBC     Status: Abnormal   Collection Time: 03/27/21  5:16 AM  Result Value Ref Range   WBC 15.3 (H) 4.0 - 10.5 K/uL   RBC 4.64 3.87 - 5.11 MIL/uL   Hemoglobin 12.8 12.0 - 15.0 g/dL   HCT 40.0 36.0 - 46.0 %   MCV 86.2 80.0 - 100.0 fL   MCH 27.6 26.0 - 34.0 pg   MCHC 32.0 30.0 - 36.0 g/dL   RDW 14.0 11.5 - 15.5 %   Platelets 304 150 - 400 K/uL   nRBC 0.0 0.0 - 0.2 %    Comment: Performed at [facility], [address], [place], [place] [address]  Basic metabolic panel     Status: Abnormal   Collection Time: 03/27/21  5:16 AM  Result Value Ref Range   Sodium 141 135 - 145 mmol/L   Potassium 3.6 3.5 - 5.1 mmol/L   Chloride 104 98 - 111 mmol/L   CO2 21 (L) 22 - 32 mmol/L   Glucose, Bld 118 (H) 70 - 99  mg/dL    Comment: Glucose reference range applies only to samples taken after fasting for at least 8 hours.   BUN 7 (L) 8 - 23 mg/dL   Creatinine, Ser 0.90 0.44 - 1.00 mg/dL   Calcium 9.6 8.9 - 10.3 mg/dL   GFR, Estimated >60 >60 mL/min    Comment: (NOTE) Calculated using the CKD-EPI Creatinine Equation (2021)    Anion gap 16 (H) 5 - 15    Comment: Performed at [facility], [address] [place], [place] [address]  Magnesium     Status: None   Collection Time: 03/27/21  5:16 AM  Result Value Ref Range   Magnesium 2.2 1.7 - 2.4 mg/dL    Comment: Performed at [facility], [address], [place], [place] [address]  Phosphorus     Status: None   Collection Time: 03/27/21  5:16 AM  Result Value Ref Range   Phosphorus 3.4 2.5 - 4.6 mg/dL    Comment: Performed at [facility], [address] [place], [place] [address]  HIV Antibody (routine testing w rflx)     Status: None   Collection Time: 03/27/21  5:16 AM  Result Value Ref Range   HIV Screen 4th Generation wRfx Non Reactive Non Reactive    Comment: Performed at [facility], [place] [address] [place],  [address]   No components found for: ESR, C REACTIVE PROTEIN MICRO: Recent Results (from the past 720 hour(s))  Resp Panel by RT-PCR (Flu A&B, Covid) Nasopharyngeal Swab     Status: None   Collection Time: 03/23/21 12:42 AM   Specimen: Nasopharyngeal Swab; Nasopharyngeal(NP) swabs in vial transport medium  Result Value Ref Range Status   SARS Coronavirus 2 by RT PCR NEGATIVE NEGATIVE Final    Comment: (NOTE) SARS-CoV-2 target nucleic acids are NOT DETECTED.  The SARS-CoV-2 RNA is generally detectable in upper respiratory specimens during the acute phase of infection. The lowest concentration of SARS-CoV-2 viral copies this assay can detect is 138 copies/mL. A negative result does not preclude SARS-Cov-2 infection and should not be used as the sole basis for treatment or other  patient management decisions. A negative  result may occur with  improper specimen collection/handling, submission of specimen other than nasopharyngeal swab, presence of viral mutation(s) within the areas targeted by this assay, and inadequate number of viral copies(<138 copies/mL). A negative result must be combined with clinical observations, patient history, and epidemiological information. The expected result is Negative.  Fact Sheet for Patients:  EntrepreneurPulse.com.au  Fact Sheet for Healthcare Providers:  IncredibleEmployment.be  This test is no t yet approved or cleared by the [place] FDA and  has been authorized for detection and/or diagnosis of SARS-CoV-2 by FDA under an Emergency Use Authorization (EUA). This EUA will remain  in effect (meaning this test can be used) for the duration of the COVID-19 declaration under Section 564(b)(1) of the Act, 21 U.S.C.section 360bbb-3(b)(1), unless the authorization is terminated  or revoked sooner.       Influenza A by PCR NEGATIVE NEGATIVE Final   Influenza B by PCR NEGATIVE NEGATIVE Final    Comment: (NOTE) The Xpert Xpress SARS-CoV-2/FLU/RSV plus assay is intended as an aid in the diagnosis of influenza from Nasopharyngeal swab specimens and should not be used as a sole basis for treatment. Nasal washings and aspirates are unacceptable for Xpert Xpress SARS-CoV-2/FLU/RSV testing.  Fact Sheet for Patients: EntrepreneurPulse.com.au  Fact Sheet for Healthcare Providers: IncredibleEmployment.be  This test is not yet approved or cleared by the [place] FDA and has been authorized for detection and/or diagnosis of SARS-CoV-2 by FDA under an Emergency Use Authorization (EUA). This EUA will remain in effect (meaning this test can be used) for the duration of the COVID-19 declaration under Section 564(b)(1) of the Act, 21 U.S.C. section  360bbb-3(b)(1), unless the authorization is terminated or revoked.  Performed at [facility], [address] [place], [place] [address]   Resp Panel by RT-PCR (Flu A&B, Covid) Nasopharyngeal Swab     Status: None   Collection Time: 03/26/21  5:26 PM   Specimen: Nasopharyngeal Swab; Nasopharyngeal(NP) swabs in vial transport medium  Result Value Ref Range Status   SARS Coronavirus 2 by RT PCR NEGATIVE NEGATIVE Final    Comment: (NOTE) SARS-CoV-2 target nucleic acids are NOT DETECTED.  The SARS-CoV-2 RNA is generally detectable in upper respiratory specimens during the acute phase of infection. The lowest concentration of SARS-CoV-2 viral copies this assay can detect is 138 copies/mL. A negative result does not preclude SARS-Cov-2 infection and should not be used as the sole basis for treatment or other patient management decisions. A negative result may occur with  improper specimen collection/handling, submission of specimen other than nasopharyngeal swab, presence of viral mutation(s) within the areas targeted by this assay, and inadequate number of viral copies(<138 copies/mL). A negative result must be combined with clinical observations, patient history, and epidemiological information. The expected result is Negative.  Fact Sheet for Patients:  EntrepreneurPulse.com.au  Fact Sheet for Healthcare Providers:  IncredibleEmployment.be  This test is no t yet approved or cleared by the [place] FDA and  has been authorized for detection and/or diagnosis of SARS-CoV-2 by FDA under an Emergency Use Authorization (EUA). This EUA will remain  in effect (meaning this test can be used) for the duration of the COVID-19 declaration under Section 564(b)(1) of the Act, 21 U.S.C.section 360bbb-3(b)(1), unless the authorization is terminated  or revoked sooner.       Influenza A by PCR NEGATIVE NEGATIVE Final   Influenza B by PCR  NEGATIVE NEGATIVE Final    Comment: (NOTE) The Xpert Xpress SARS-CoV-2/FLU/RSV plus assay is intended  as an aid in the diagnosis of influenza from Nasopharyngeal swab specimens and should not be used as a sole basis for treatment. Nasal washings and aspirates are unacceptable for Xpert Xpress SARS-CoV-2/FLU/RSV testing.  Fact Sheet for Patients: EntrepreneurPulse.com.au  Fact Sheet for Healthcare Providers: IncredibleEmployment.be  This test is not yet approved or cleared by the [place] FDA and has been authorized for detection and/or diagnosis of SARS-CoV-2 by FDA under an Emergency Use Authorization (EUA). This EUA will remain in effect (meaning this test can be used) for the duration of the COVID-19 declaration under Section 564(b)(1) of the Act, 21 U.S.C. section 360bbb-3(b)(1), unless the authorization is terminated or revoked.  Performed at [facility], [address] [place], [place] [address]   Blood culture (routine x 2)     Status: None (Preliminary result)   Collection Time: 03/26/21  9:09 PM   Specimen: BLOOD  Result Value Ref Range Status   Specimen Description BLOOD RIGHT ANTECUBITAL  Final   Special Requests   Final    BOTTLES DRAWN AEROBIC AND ANAEROBIC Blood Culture adequate volume   Culture   Final    NO GROWTH < 12 HOURS Performed at [facility], [address], [place], [place] [address]    Report Status PENDING  Incomplete  Blood culture (routine x 2)     Status: None (Preliminary result)   Collection Time: 03/26/21 10:18 PM   Specimen: BLOOD  Result Value Ref Range Status   Specimen Description BLOOD BLOOD RIGHT HAND  Final   Special Requests   Final    BOTTLES DRAWN AEROBIC AND ANAEROBIC Blood Culture results may not be optimal due to an inadequate volume of blood received in culture bottles   Culture   Final    NO GROWTH < 12 HOURS Performed at [facility], [address], [place], [place] [address]    Report Status PENDING  Incomplete  MRSA PCR Screening     Status: None   Collection Time: 03/26/21 10:56 PM   Specimen: Nasal Mucosa; Nasopharyngeal  Result Value Ref Range Status   MRSA by PCR NEGATIVE NEGATIVE Final    Comment:        The GeneXpert MRSA Assay (FDA approved for NASAL specimens only), is one component of a comprehensive MRSA colonization surveillance program. It is not intended to diagnose MRSA infection nor to guide or monitor treatment for MRSA infections. Performed at [facility], [address] [place], [place] [address]     IMAGING: CT Head Wo Contrast  Result Date: 03/26/2021 CLINICAL DATA:  Change in mental status.  Unknown cause. EXAM: CT HEAD WITHOUT CONTRAST TECHNIQUE: Contiguous axial images were obtained from the base of the skull through the vertex without intravenous contrast. COMPARISON:  MRI brain 03/21/2021.  CT head 03/21/2021 FINDINGS: Brain: No evidence of acute infarction, hemorrhage, hydrocephalus, extra-axial collection or mass lesion/mass effect. Vascular: No hyperdense vessel or unexpected calcification. Skull: Calvarium appears intact. Sinuses/Orbits: Paranasal sinuses and mastoid air cells are clear. Other: Congenital nonunion of the posterior arch of C1. IMPRESSION: No acute intracranial abnormalities. Electronically Signed   By: Lucienne Capers M.D.   On: 03/26/2021 19:08   CT Head Wo Contrast  Result Date: 03/21/2021 CLINICAL DATA:  Neuro deficit, acute, stroke suspected. Additional history provided: Patient reports dizziness EXAM: CT HEAD WITHOUT CONTRAST TECHNIQUE: Contiguous axial images were obtained from the base of the skull through the vertex without intravenous contrast. COMPARISON:  No pertinent prior exams  available for comparison. FINDINGS: Brain: Cerebral volume is normal. There is no acute intracranial hemorrhage. No demarcated cortical infarct. No extra-axial fluid collection. No evidence  of intracranial mass. No midline shift. Vascular: No hyperdense vessel.  Atherosclerotic calcifications. Skull: Normal. Negative for fracture or focal lesion. Sinuses/Orbits: Visualized orbits show no acute finding. No significant paranasal sinus disease at the imaged levels. Other: Congenital nonunion of the posterior arch of C1. IMPRESSION: Unremarkable non-contrast CT appearance of the brain. No evidence of acute intracranial abnormality. Electronically Signed   By: Kellie Simmering DO   On: 03/21/2021 12:38   MR BRAIN WO CONTRAST  Result Date: 03/21/2021 CLINICAL DATA:  Dizzy EXAM: MRI HEAD WITHOUT CONTRAST TECHNIQUE: Multiplanar, multiecho pulse sequences of the brain and surrounding structures were obtained without intravenous contrast. COMPARISON:  None. FINDINGS: Brain: There is no acute infarction or intracranial hemorrhage. There is no intracranial mass, mass effect, or edema. There is no hydrocephalus or extra-axial fluid collection. Ventricles and sulci are normal in size and configuration. Few punctate foci of T2 hyperintensity are present in the supratentorial white matter and may reflect minor chronic microvascular ischemic changes or gliosis/demyelination of other etiologies. Vascular: Major vessel flow voids at the skull base are preserved. Skull and upper cervical spine: Normal marrow signal is preserved. Sinuses/Orbits: Trace mucosal thickening.  Orbits are unremarkable. Other: Sella is unremarkable.  Mastoid air cells are clear. IMPRESSION: No evidence of recent infarction, hemorrhage, or mass. Probable minor chronic microvascular ischemic changes. Electronically Signed   By: Macy Mis M.D.   On: 03/21/2021 14:53   DG Chest Portable 1 View  Result Date: 03/26/2021 CLINICAL DATA:  Intubation, placement of or of gastric tube, altered mental status EXAM: PORTABLE CHEST 1 VIEW COMPARISON:  Portable exam 1948 hours compared to 1803 hours FINDINGS: Tip of endotracheal tube projects 2.2 cm above  carina. Orogastric tube extends into stomach. Enlargement of cardiac silhouette with vascular congestion. Mild RIGHT basilar atelectasis. Remaining lungs clear. No pleural effusion or pneumothorax. IMPRESSION: Mild RIGHT basilar atelectasis. Tube positions as above. Electronically Signed   By: Lavonia Dana M.D.   On: 03/26/2021 20:17   DG Chest Portable 1 View  Result Date: 03/26/2021 CLINICAL DATA:  Altered mental status, hypertension, diabetes mellitus EXAM: PORTABLE CHEST 1 VIEW COMPARISON:  Portable exam 1812 hours compared to 03/22/2021 FINDINGS: Enlargement of cardiac silhouette with pulmonary vascular congestion. Mediastinal contour stable. Decreased lung volumes with minimal bibasilar atelectasis. No acute infiltrate, pleural effusion, or pneumothorax. IMPRESSION: Enlargement of cardiac silhouette. Low lung volumes with bibasilar atelectasis. Electronically Signed   By: Lavonia Dana M.D.   On: 03/26/2021 18:41   DG Chest Portable 1 View  Result Date: 03/22/2021 CLINICAL DATA:  Chest tightness. EXAM: PORTABLE CHEST 1 VIEW COMPARISON:  July 23, 2008 FINDINGS: Cardiomegaly. The hila and mediastinum are unremarkable. No pneumothorax. Mild interstitial prominence without overt edema. No suspicious infiltrates. IMPRESSION: Cardiomegaly. Suspected pulmonary venous congestion. No overt edema. Electronically Signed   By: Dorise Bullion III M.D   On: 03/22/2021 18:31   DG Abd Portable 1 View  Result Date: 03/26/2021 CLINICAL DATA:  Orogastric tube placement EXAM: PORTABLE ABDOMEN - 1 VIEW COMPARISON:  Portable exam 1949 hours without priors for comparison FINDINGS: Orogastric tube coiled in proximal stomach. Visualized bowel gas pattern normal. Prior lumbar fusion. Borderline enlargement of cardiac silhouette. Surgical clips RIGHT upper quadrant likely reflect prior cholecystectomy. IMPRESSION: Nasogastric tube coiled in proximal stomach. Electronically Signed   By: Lavonia Dana M.D.   On:  03/26/2021 20:16    Assessment:   Megan Oneal is a 65 y.o. female With a history of multiple medical problems including depression anxiety, hypertension, type 2 diabetes who was admitted after period of unresponsiveness of unclear duration with a bottle of Flexeril next to her.  She had recently had an ER visit where she was noting auditory hallucinations ongoing for several months and was started on Risperdal at that visit.  On this admission she was intubated and has been unresponsive.  Work-up including chest x-ray MRI of the brain abdominal x-ray and labs have all been negative.  HIV is negative.  Procalcitonin is normal.  Is unclear etiology but I suspect potential drug overdose since her tox screen was positive for tricyclics which Flexeril is.  Recommendations Cont vanco, ctx, amp, acyclovir pending LP results Would consult neurology.  Thank you very much for allowing me to participate in the care of this patient. Please call with questions.   Cheral Marker. Ola Spurr, MD

## 2021-03-27 NOTE — Plan of Care (Signed)
Pt turned Q2H this shift, oral care initiated, family t bedside, understanding of POC verbalized, pt to MRI and IVR for LP this afternoon. Pt is unresponsive to deep suctioning for the most part, but randomly becomes agitated and aware of ETT, which she reaches for..  Does not follow commands or track with eyes, no eye opening.

## 2021-03-28 ENCOUNTER — Inpatient Hospital Stay: Payer: Medicare HMO

## 2021-03-28 DIAGNOSIS — R4182 Altered mental status, unspecified: Secondary | ICD-10-CM | POA: Diagnosis not present

## 2021-03-28 DIAGNOSIS — J9601 Acute respiratory failure with hypoxia: Secondary | ICD-10-CM | POA: Diagnosis not present

## 2021-03-28 DIAGNOSIS — G9340 Encephalopathy, unspecified: Secondary | ICD-10-CM | POA: Diagnosis not present

## 2021-03-28 LAB — CBC
HCT: 36.2 % (ref 36.0–46.0)
Hemoglobin: 11.8 g/dL — ABNORMAL LOW (ref 12.0–15.0)
MCH: 27.6 pg (ref 26.0–34.0)
MCHC: 32.6 g/dL (ref 30.0–36.0)
MCV: 84.8 fL (ref 80.0–100.0)
Platelets: 292 10*3/uL (ref 150–400)
RBC: 4.27 MIL/uL (ref 3.87–5.11)
RDW: 14.5 % (ref 11.5–15.5)
WBC: 15.9 10*3/uL — ABNORMAL HIGH (ref 4.0–10.5)
nRBC: 0 % (ref 0.0–0.2)

## 2021-03-28 LAB — POTASSIUM: Potassium: 3.4 mmol/L — ABNORMAL LOW (ref 3.5–5.1)

## 2021-03-28 LAB — GLUCOSE, CAPILLARY
Glucose-Capillary: 166 mg/dL — ABNORMAL HIGH (ref 70–99)
Glucose-Capillary: 184 mg/dL — ABNORMAL HIGH (ref 70–99)
Glucose-Capillary: 189 mg/dL — ABNORMAL HIGH (ref 70–99)
Glucose-Capillary: 205 mg/dL — ABNORMAL HIGH (ref 70–99)

## 2021-03-28 LAB — RENAL FUNCTION PANEL
Albumin: 3.2 g/dL — ABNORMAL LOW (ref 3.5–5.0)
Anion gap: 9 (ref 5–15)
BUN: 12 mg/dL (ref 8–23)
CO2: 23 mmol/L (ref 22–32)
Calcium: 9 mg/dL (ref 8.9–10.3)
Chloride: 109 mmol/L (ref 98–111)
Creatinine, Ser: 0.89 mg/dL (ref 0.44–1.00)
GFR, Estimated: >60 mL/min (ref >60)
Glucose, Bld: 228 mg/dL — ABNORMAL HIGH (ref 70–99)
Phosphorus: 2.7 mg/dL (ref 2.5–4.6)
Potassium: 3.2 mmol/L — ABNORMAL LOW (ref 3.5–5.1)
Sodium: 141 mmol/L (ref 135–145)

## 2021-03-28 LAB — VDRL, CSF: VDRL Quant, CSF: NONREACTIVE

## 2021-03-28 LAB — HSV 1/2 PCR, CSF
HSV-1 DNA: NEGATIVE
HSV-2 DNA: NEGATIVE

## 2021-03-28 LAB — MAGNESIUM: Magnesium: 2.1 mg/dL (ref 1.7–2.4)

## 2021-03-28 MED ORDER — DEXMEDETOMIDINE HCL IN NACL 400 MCG/100ML IV SOLN: 0.4000 ug/kg/h | INTRAVENOUS | Status: DC

## 2021-03-28 MED ORDER — INSULIN ASPART 100 UNIT/ML ~~LOC~~ SOLN
0.0000 [IU] | SUBCUTANEOUS | Status: DC
  Administered 2021-03-28 (×2): 3 [IU] via SUBCUTANEOUS
  Administered 2021-03-28: 5 [IU] via SUBCUTANEOUS
  Administered 2021-03-28: 3 [IU] via SUBCUTANEOUS
  Administered 2021-03-29: 2 [IU] via SUBCUTANEOUS
  Administered 2021-03-29 (×2): 3 [IU] via SUBCUTANEOUS
  Administered 2021-03-30 – 2021-04-01 (×3): 2 [IU] via SUBCUTANEOUS
  Filled 2021-03-28 (×10): qty 1

## 2021-03-28 MED ORDER — POTASSIUM CHLORIDE 20 MEQ PO PACK
40.0000 meq | PACK | Freq: Once | ORAL | Status: AC
  Administered 2021-03-28: 40 meq
  Filled 2021-03-28: qty 2

## 2021-03-28 MED ORDER — CHLORHEXIDINE GLUCONATE 0.12 % MT SOLN
OROMUCOSAL | Status: AC
  Filled 2021-03-28: qty 15

## 2021-03-28 MED ORDER — MIDAZOLAM HCL 2 MG/2ML IJ SOLN
2.0000 mg | Freq: Once | INTRAMUSCULAR | Status: AC
  Administered 2021-03-28: 2 mg via INTRAVENOUS

## 2021-03-28 MED ORDER — POTASSIUM CHLORIDE 20 MEQ PO PACK
40.0000 meq | PACK | Freq: Once | ORAL | Status: AC
Start: 2021-03-29 — End: 2021-03-29
  Administered 2021-03-29: 40 meq
  Filled 2021-03-28: qty 2

## 2021-03-28 MED ORDER — FENTANYL 2500MCG IN NS 250ML (10MCG/ML) PREMIX INFUSION
0.0000 ug/h | INTRAVENOUS | Status: DC
  Administered 2021-03-28 (×2): 25 ug/h via INTRAVENOUS
  Administered 2021-03-29: 150 ug/h via INTRAVENOUS
  Filled 2021-03-28 (×2): qty 250

## 2021-03-28 MED ORDER — MIDAZOLAM HCL 2 MG/2ML IJ SOLN
INTRAMUSCULAR | Status: AC
  Filled 2021-03-28: qty 2

## 2021-03-28 MED ORDER — INSULIN ASPART 100 UNIT/ML ~~LOC~~ SOLN: 0.0000 [IU] | Freq: Four times a day (QID) | SUBCUTANEOUS | Status: DC

## 2021-03-28 NOTE — Consult Note (Addendum)
Pharmacy Antibiotic Note  Megan Oneal is a 65 y.o. female admitted on 03/26/2021 with un-responsiveness and concern for meningitis. Vitals significant for hypothermia, hypertension, tachycardia. Labs on admission without leukocytosis, no left shift, PCT < 0.10. CT head with no intracranial abnormalities. Currently, WBC 15.9, Scr stable ~0.9, afebrile. MRI and CT head: no acute abnormality. CSF not consistent with meningitis. All antibiotics to be discontinued per ID.   Plan: --Discontinue Acyclovir 935 mg (10 mg/kg AdjBW) IV q8h --Discontinue ampicillin 2g IV q4h --Discontinue ceftriaxone 2g IV q12h --Discontinue vancomycin from 1000 mg IV q12h  Height: 5' 5.98" (167.6 cm) Weight: (!) 144.9 kg (319 lb 7.1 oz) IBW/kg (Calculated) : 59.26  Temp (24hrs), Avg:98.4 F (36.9 C), Min:97.88 F (36.6 C), Max:99 F (37.2 C)  Recent Labs  Lab 03/21/21 1120 03/22/21 1816 03/26/21 1732 03/27/21 0516 03/28/21 0550  WBC 10.2 11.6* 8.8 15.3* 15.9*  CREATININE 0.90 0.94 0.97 0.90 0.89    Estimated Creatinine Clearance: 95.5 mL/min (by C-G formula based on SCr of 0.89 mg/dL).    Allergies  Allergen Reactions  . Metoprolol Other (See Comments)    Fatigue and malaise; wheezing  . Sulfa Antibiotics     Antimicrobials this admission: Ceftriaxone 4/6 >> 4/8 Ampicillin 4/6 >> 4/8 Vancomycin 4/6 >> 4/8 Acyclovir 4/6 >> 4/8  Dose adjustments this admission: N/A  Microbiology results: 4/6 BCx: NGTD 4/6 MRSA PCR: negative 4/6 CSF cxs: pending 4/7 HSV: negative 4/7 HIV: negative  Thank you for allowing pharmacy to be a part of this patient's care. Pharmacy to sign-off of abx monitoring.   Benn Moulder, PharmD Pharmacy Resident  03/28/2021 8:10 AM

## 2021-03-28 NOTE — Procedures (Signed)
Central Venous Catheter Insertion Procedure Note  Megan Oneal  [id number]  1956/10/20  Date:03/28/21  Time:12:35 PM   Provider Performing:Mellina Benison A Stark Klein   Procedure: Insertion of Non-tunneled Central Venous [id number]) with US guidance (07225)   Indication(s) Medication administration and Difficult access  Consent Risks of the procedure as well as the alternatives and risks of each were explained to the patient and/or caregiver.  Consent for the procedure was obtained and is signed in the bedside chart  Anesthesia patient already sedated and on Fentanyl drip  Timeout Verified patient identification, verified procedure, site/side was marked, verified correct patient position, special equipment/implants available, medications/allergies/relevant history reviewed, required imaging and test results available.  Sterile Technique Maximal sterile technique including full sterile barrier drape, hand hygiene, sterile gown, sterile gloves, mask, hair covering, sterile ultrasound probe cover (if used).  Procedure Description Area of catheter insertion was cleaned with chlorhexidine and draped in sterile fashion.  With real-time ultrasound guidance a central venous catheter was placed into the left internal jugular vein. Nonpulsatile blood flow and easy flushing noted in all ports.  The catheter was sutured in place and sterile dressing applied.  Complications/Tolerance None; patient tolerated the procedure well. Chest X-ray is ordered to verify placement for internal jugular or subclavian cannulation.   Chest x-ray is not ordered for femoral cannulation.  EBL Minimal   Specimen(s) None  Line placed at 20 cm mark Biopatch applied    Rufina Falco, DNP, CCRN, FNP-C, AGACNP-BC Acute Care Nurse Practitioner  [place] Pager: [phone] [place] at [facility]

## 2021-03-28 NOTE — Progress Notes (Signed)
GOALS OF CARE DISCUSSION  The Clinical status was relayed to family in detail.  Updated and notified of patients medical condition.  Patient remains unresponsive and will not open eyes to command.    Patient is having a weak cough and struggling to remove secretions.   Patient with increased WOB and using accessory muscles to breathe Explained to family course of therapy and the modalities    Patient with severe toxic metabolic encephalopathy Etiology is unclear,   PATIENT REMAINS FULL CODE  Family understands the situation.   Family are satisfied with Plan of action and management. All questions answered  Additional CC time 35 mins   Megan Oneal Patricia Pesa, M.D.  [facility]  Medical Director [place] Director [facility] Cardio-Pulmonary Department

## 2021-03-28 NOTE — Consult Note (Addendum)
NEURO HOSPITALIST CONSULT NOTE   Requestig physician: Dr. Mortimer Fries  Reason for Consult: Encephalopathy  History obtained from:  Chart     HPI:                                                                                                                                          Megan Oneal is an 65 y.o. female with a PMHx of DDD, anxiety, DM, frequent PVCs, HTN, otalgia and right neck bruit who presented to the [facility] ED via EMS on Wednesday with unresponsiveness. LKN was at 0630 on the same day. A Flexeril bottle was found near the patient by family. She had been seen in the [facility] ED two days previously (4/4) with a 2 month history of auditory hallucinations; she had been evaluated by Psychiatry and was started on Risperdal, then discharged home. Upon re-presenting to the ED on Wednesday, she was tachycardic but stable on RA. She was able to withdraw from noxious stimuli and her pupils were symmetric and reactive. CXR was concerning for bibasilar atelectasis. CT head showed no acute abnormality. CK was mildly elevated at 416. Troponin was normal. Acetaminophen and salicylate levels were unremarkable. EtOH level was < 10. TSH was 0.694. Tox screen was positive for tricyclics. She was subsequently intubated. LP attempt by EDP was not successful. She was started on acyclovir, vancomycin, ampicillin and ceftriaxone for empiric meningitis coverage. She was then admitted to the ICU. ID was consulted.   Another LP attempt was successful, revealing normal WBC and protein. Glucose was not low and the sample was clear and colorless. VDRL was negative. HSV 1/2 PCR also negative. Antibiotics have been discontinued.   CBC today shows elevated WBC at 15.9.    Past Medical History:  Diagnosis Date  . Anxiety 03/12/2013  . Degenerative disc disease, lumbar    Epidural injections in 2007  . Diabetes mellitus without complication ([place])   . Endometriosis   . Frequent PVCs    12,000/24 hours  .  Hypertension   . Otalgia, unspecified   . Palpitations 10/04/2010  . Vascular bruit 01/2013   Right neck    Past Surgical History:  Procedure Laterality Date  . BREAST SURGERY     benign tumor excised-age 68  . CARDIOVASCULAR STRESS TEST  03/13/2008   No scintigraphic evidence of inducible myocardial ischemia, EKG negative for ischemia, no ECG changes.  . COLONOSCOPY  2009  . COLONOSCOPY N/A 09/21/2019   Procedure: COLONOSCOPY;  Surgeon: Rogene Houston, MD;  Location: [facility];  Service: Endoscopy;  Laterality: N/A;  2:00-10:30  . CYST EXCISION  2007   Scalp  . DOPPLER ECHOCARDIOGRAPHY  03/13/2008   EF >41%, LV systolic function is normal, LV wall function is normal  . LAPAROSCOPIC CHOLECYSTECTOMY  2010  . LUMBAR LAMINECTOMY  2009    L4-L5 and L5-S1 laminectomy with diskectomy; transforaminal  . POLYPECTOMY  09/21/2019   Procedure: POLYPECTOMY;  Surgeon: Rogene Houston, MD;  Location: [facility];  Service: Endoscopy;;  . REPLACEMENT TOTAL KNEE    . SLEEP STUDY  10/29/2009   AHI-1.8/hr, during REM 6.4/hr; RDI-8.0/hr, during REM 15.0/hr; avg oxygen during REM and NREM was 94%  . TOTAL KNEE ARTHROPLASTY Bilateral   . TUBAL LIGATION      Family History  Problem Relation Age of Onset  . Diabetes Mother   . Hypertension Mother   . Diabetes Brother   . Hypertension Brother   . Hypertension Father              Social History:  reports that she quit smoking about 24 years ago. Her smoking use included cigarettes. She started smoking about 48 years ago. She has a 40.00 pack-year smoking history. She quit smokeless tobacco use about 26 years ago. She reports that she does not drink alcohol and does not use drugs.  Allergies  Allergen Reactions  . Metoprolol Other (See Comments)    Fatigue and malaise; wheezing  . Sulfa Antibiotics     MEDICATIONS:                                                                                                                     Prior to  Admission:  Medications Prior to Admission  Medication Sig Dispense Refill Last Dose  . cyclobenzaprine (FLEXERIL) 10 MG tablet Take 10 mg by mouth 3 (three) times daily as needed for muscle spasms.     Marland Kitchen ipratropium (ATROVENT) 0.03 % nasal spray Place 2 sprays into both nostrils 3 (three) times daily as needed for rhinitis.     . prednisoLONE acetate (PRED FORTE) 1 % ophthalmic suspension 1 drop 4 (four) times daily.     Marland Kitchen aspirin 81 MG tablet Take 81 mg by mouth daily.     . cholecalciferol (VITAMIN D3) 25 MCG (1000 UT) tablet Take 1,000 Units by mouth daily.     Marland Kitchen dexlansoprazole (DEXILANT) 60 MG capsule Take 60 mg by mouth daily.     Marland Kitchen diltiazem (CARDIZEM) 30 MG tablet TAKE 1 TABLET (30 MG TOTAL) BY MOUTH 2 (TWO) TIMES DAILY AS NEEDED (PALPITATIONS). 180 tablet 2   . escitalopram (LEXAPRO) 10 MG tablet Take 10 mg by mouth daily.     . famotidine (PEPCID) 20 MG tablet Take 20 mg by mouth at bedtime.     . ferrous sulfate 325 (65 FE) MG tablet Take 325 mg by mouth daily with breakfast.      . fluticasone (FLONASE) 50 MCG/ACT nasal spray Place 2 sprays into both nostrils daily as needed for allergies.  (Patient not taking: Reported on 03/27/2021)   Completed Course at Unknown time  . furosemide (LASIX) 40 MG tablet Take 40 mg by mouth every other day.      Marland Kitchen HYDROcodone-acetaminophen (NORCO/VICODIN) 5-325 MG  per tablet Take 1 tablet by mouth every 4 (four) hours as needed for moderate pain or severe pain.   0   . levocetirizine (XYZAL) 5 MG tablet Take 1 tablet by mouth every evening.      Marland Kitchen losartan (COZAAR) 100 MG tablet Take 100 mg by mouth daily.     . meclizine (ANTIVERT) 25 MG tablet Take 1 tablet (25 mg total) by mouth 3 (three) times daily as needed for dizziness. 30 tablet 0   . megestrol (MEGACE) 20 MG tablet TAKE 1 TABLET BY MOUTH EVERY DAY 90 tablet 3   . meloxicam (MOBIC) 15 MG tablet Take 15 mg by mouth daily as needed. (Patient not taking: Reported on 03/27/2021)   Completed Course at  Unknown time  . polyethylene glycol powder (GLYCOLAX/MIRALAX) powder 1 scoop daily or as needed (Patient not taking: Reported on 03/27/2021) 255 g 11 Completed Course at Unknown time  . PREMARIN vaginal cream PLACE 1 APPLICATORFUL VAGINALLY DAILY. 30 g 12   . risperiDONE (RISPERDAL) 0.5 MG tablet Take 1 tablet (0.5 mg total) by mouth 2 (two) times daily. 60 tablet 1   . rosuvastatin (CRESTOR) 5 MG tablet Take 5 mg by mouth daily.     . verapamil (VERELAN PM) 240 MG 24 hr capsule TAKE 1 CAPSULE BY MOUTH TWICE A DAY 180 capsule 2   . vitamin C (ASCORBIC ACID) 500 MG tablet Take 500 mg by mouth daily.      Scheduled: . vitamin C  500 mg Per Tube Daily  . aspirin  81 mg Per Tube Daily  . chlorhexidine gluconate (MEDLINE KIT)  15 mL Mouth Rinse BID  . Chlorhexidine Gluconate Cloth  6 each Topical Daily  . cholecalciferol  1,000 Units Oral Daily  . enoxaparin (LOVENOX) injection  0.5 mg/kg Subcutaneous Q24H  . feeding supplement (PROSource TF)  90 mL Per Tube QID  . feeding supplement (VITAL HIGH PROTEIN)  1,000 mL Per Tube Q24H  . ferrous sulfate  325 mg Oral Q breakfast  . free water  30 mL Per Tube Q4H  . insulin aspart  0-15 Units Subcutaneous Q4H  . lidocaine (PF)  5 mL Other Once  . losartan  100 mg Per Tube Daily  . mouth rinse  15 mL Mouth Rinse 10 times per day  . midazolam      . multivitamin  15 mL Per Tube Daily  . rosuvastatin  5 mg Per Tube Daily   Continuous: . dexmedetomidine (PRECEDEX) IV infusion    . famotidine (PEPCID) IV Stopped (03/27/21 2149)  . fentaNYL infusion INTRAVENOUS 35 mcg/hr (03/28/21 1900)  . lactated ringers 75 mL/hr at 03/28/21 1900  . propofol (DIPRIVAN) infusion 20 mcg/kg/min (03/28/21 2015)     ROS:  Unable to obtain due to AMS.    Blood pressure (!) 143/71, pulse (!) 102, temperature 98.6 F (37 C), resp. rate 19,  height 5' 5.98" (1.676 m), weight (!) 144.9 kg, SpO2 97 %.   General Examination:                                                                                                       Physical Exam  HEENT-  [place]/AT. No nuchal rigidity.  Lungs- Intubated Extremities- Warm and well perfused  Neurological Examination Mental Status: Patient on propofol 25 mcg/kg/min and fentanyl at a rate of 35 at the beginning of the assessment. These sedatives were held, followed by gradual awakening to a less deeply sedated state and ability of patient to follow some commands. Unable to test speech output due to sedation.  Cranial Nerves: II: PERRL. Gazes to left and right when asked. Unable to definitively test visual fields.  III,IV, VI: Briefly gazed to left and right with conjugate EOM. No ptosis.  V,VII: Corneal reflexes intact. Face symmetric in the context of intubation.  VIII: Hearing intact to some commands IX,X: Intubated XI: Head is midline XII: Intubated Motor/Sensory: Will withdraw and thrash BUE to noxious without asymmetry. Briefly gripped examiners' hands on right and left to command.  Brisk withdrawal of BLE to noxious plantar stimulation.  Deep Tendon Reflexes: 2+ bilateral brachioradialis, biceps, patellae and achilles. No asymmetry noted.  Plantars: Equivocal bilaterally  Cerebellar/Gait: Unable to assess   Lab Results: Basic Metabolic Panel: Recent Labs  Lab 03/22/21 1816 03/26/21 1732 03/26/21 2109 03/27/21 0516 03/28/21 0550  NA 139 135  --  141 141  K 3.2* 3.4*  --  3.6 3.2*  CL 106 99  --  104 109  CO2 23 23  --  21* 23  GLUCOSE 140* 92  --  118* 228*  BUN 7* 8  --  7* 12  CREATININE 0.94 0.97  --  0.90 0.89  CALCIUM 9.2 9.7  --  9.6 9.0  MG  --   --  2.1 2.2 2.1  PHOS  --   --  4.5 3.4 2.7    CBC: Recent Labs  Lab 03/22/21 1816 03/26/21 1732 03/27/21 0516 03/28/21 0550  WBC 11.6* 8.8 15.3* 15.9*  NEUTROABS  --  5.6  --   --   HGB 11.9* 12.3 12.8 11.8*   HCT 37.4 38.4 40.0 36.2  MCV 86.2 85.0 86.2 84.8  PLT 307 290 304 292    Cardiac Enzymes: Recent Labs  Lab 03/26/21 2109  CKTOTAL 416*    Lipid Panel: Recent Labs  Lab 03/27/21 0516  TRIG 81    Imaging: DG Chest 1 View  Result Date: 03/28/2021 CLINICAL DATA:  Hypoxia.  Central catheter placement EXAM: CHEST  1 VIEW COMPARISON:  March 28, 2021 study obtained earlier in the day FINDINGS: Central catheter tip is at the cavoatrial junction. Endotracheal tube tip is 3.1 cm above the carina. Nasogastric tube tip and side port in stomach. No pneumothorax. There is mild left base atelectasis. The lungs elsewhere are  clear. Heart is enlarged with pulmonary vascularity within normal limits. No adenopathy. No bone lesions. IMPRESSION: Tube and catheter positions as described without pneumothorax. No edema or consolidation. Left base atelectasis. There is cardiomegaly. No adenopathy. Electronically Signed   By: Lowella Grip III M.D.   On: 03/28/2021 13:11   MR BRAIN WO CONTRAST  Result Date: 03/27/2021 CLINICAL DATA:  Mental status change. EXAM: MRI HEAD WITHOUT CONTRAST TECHNIQUE: Multiplanar, multiecho pulse sequences of the brain and surrounding structures were obtained without intravenous contrast. COMPARISON:  Head CT March 26, 2021 FINDINGS: Brain: No acute infarction, hemorrhage, hydrocephalus, extra-axial collection or mass lesion. A few small foci of T2 hyperintensity are seen within the white matter of the cerebral hemispheres, nonspecific. Vascular: Normal flow voids. Skull and upper cervical spine: Normal marrow signal. Sinuses/Orbits: Negative. IMPRESSION: 1. No acute intracranial abnormality. 2. A few small foci of T2 hyperintensity within the white matter of the cerebral hemispheres, nonspecific but may be seen with chronic microvascular ischemic changes. Electronically Signed   By: Pedro Earls M.D.   On: 03/27/2021 14:43   DG Chest Port 1 View  Result Date:  03/28/2021 CLINICAL DATA:  Acute respiratory failure with hypoxia. EXAM: PORTABLE CHEST 1 VIEW COMPARISON:  03/26/2021 FINDINGS: Endotracheal and enteric tubes are unchanged in position. Shallow inspiration. Mild cardiac enlargement. Prominent pulmonary vascularity with bilateral basilar atelectasis. No definite pleural effusion or pneumothorax. Degenerative changes in the spine. IMPRESSION: Shallow inspiration with bilateral basilar atelectasis. Cardiac enlargement with pulmonary vascular congestion, similar to prior study. Electronically Signed   By: Lucienne Capers M.D.   On: 03/28/2021 01:21   DG FL GUIDED LUMBAR PUNCTURE  Result Date: 03/27/2021 CLINICAL DATA:  Acute encephalopathy. EXAM: DIAGNOSTIC LUMBAR PUNCTURE UNDER FLUOROSCOPIC GUIDANCE COMPARISON:  MRI lumbar spine 04/25/2008. FLUOROSCOPY TIME:  Fluoroscopy Time:  0 minutes 54 seconds Radiation Exposure Index (if provided by the fluoroscopic device): 42.9 mGy Number of Acquired Spot Images: 1 PROCEDURE: After discussing the risks and benefits of this procedure with the patient's sister informed consent was obtained. Risk of bleeding, infection, allergy, and need for blood patch discussed with patient's sister. With the patient prone, the lower back was prepped with Betadine. 1% Lidocaine was used for local anesthesia. Lumbar puncture was performed at the L5-S1 level using a 22 gauge needle with return of clear CSF. Opening pressure not obtained due to patient's clinical condition. 8 ml of CSF were obtained for laboratory studies. The patient tolerated the procedure well and there were no apparent complications. IMPRESSION: Successful fluoroscopically directed lumbar puncture. 8 cc of clear CSF obtained and sent to the laboratory for ordered studies. Electronically Signed   By: Marcello Moores  Register   On: 03/27/2021 14:59    Assessment: 65 year old female with severe encephalopathy in the context of severe hypoxic respiratory failure requiring  intubation.  1. Neurological exam limited by sedation reveals no lateralized weakness. She is able to follow simple commands. No neck stiffness is present. No clinical seizure activity seen on exam. 2. CSF not consistent with infection.  3. DDx for her presentation includes medication overdose. Less likely would be unwitnessed seizure with postictal state. No evidence for head trauma on head CT. LP has essentially ruled out meningitis and HSV encephalitis. Unusual presentation for stroke is low on the DDx.  4. MRI brain on 4/7 revealed no acute intracranial abnormality. A few nonspecific small foci of T2 hyperintensity within the white matter of the cerebral hemispheres most likely represent chronic microvascular ischemic  changes (images personally reviewed).  Recommendations: 1. EEG (ordered) 2. Attempt to wean off vent 3. ID is following. Antibiotics have been discontinued.  4. Consider a Toxicology consult 5. Repeat Neurology exam when off sedation  35 minutes spent in the neurological evaluation and management of this critically ill patient.    Electronically signed: Dr. Kerney Elbe 03/28/2021, 8:35 PM

## 2021-03-28 NOTE — Progress Notes (Signed)
NAME:  Megan Oneal, MRN:  [id number], DOB:  08/04/1956, LOS: 2 ADMISSION DATE:  03/26/2021, CONSULTATION DATE: 03/26/2021 REFERRING MD: Dr. Tamala Julian, CHIEF COMPLAINT: Altered Mental Status   Brief Pt Description:  65 y.o. Female admitted with Acute Metabolic Encephalopathy in setting of possible Meninginitis vs. Drug overdose (? Flexeril), along with Acute Hypoxic Respiratory Failure requiring intubation.  History of Present Illness:  This is a 65 yo female who presented to [facility] ER on 04/6 via EMS with unresponsiveness with reported last well known time at 06:30 am on 04/6.  Family also reported finding a flexiril bottle near the pt.  She recently presented to [facility] ER on 04/4 with auditory hallucinations that started a couple of months prior to that ER presentation.  At that time she was evaluated by psychiatry and denied suicidal or homicidal ideations  She did not require hospital admission but was instructed to start Risperdal 0.5 mg bid and to follow-up with her PCP and Otolaryngologist.    ED Course Upon arrival to the ER pt tachycardic but stable on RA.  Per ER notes pt able to withdraw from noxious stimuli and pupils were symmetric/reactive.  EKG revealed sinus tachycardia, hr 103 but no signs of ischemia.  Respiratory Panel by RT-PCR negative, however CXR concerning for bibasilar atelectasis.  CT Head negative for acute intracranial abnormalities.  Lab results revealed K+ 3.4, acetaminophen level <60, salicylate level <6.3, TSH 0.694, urine drug positive for tricyclic's, and vbg pH 0.16/WFU9 36/bicarb 23.9.  She was subsequently mechanically intubated.  ED physician attempted lumbar puncture, however was unsuccessful.  Pt received acyclovir, vancomycin, and ceftriaxone for meningitis coverage. PCCM team contacted for ICU admission.    Pertinent  Medical History  Right Neck Vascular Bruit Palpitations Otalgia HTN Endometriosis  Type II Diabetes Mellitus Degenerative Disc  Disease Anxiety Depression    Cultures:  03/26/2021: SARS-CoV-2 PCR>> negative 03/27/2019: Influenza PCR>> negative 03/26/2021: Blood culture x2>> 03/27/2024: CSF>>  Antimicrobials:  . Vancomycin 04/6>> . Ceftriaxone 04/6>> . Acyclovir 04/6>> . Ampicillin 04/6>>  Significant Hospital Events: Including procedures, antibiotic start and stop dates in addition to other pertinent events   . ER physician attempted lumbar puncture, however was unsuccessful . Pt admitted to ICU with acute encephalopathy of unknown etiology mechanically intubated 03/28/2021: Underwent LP on 4/7, initial analysis of CSF not consistent with Bacterial Meningitis, MRI and EEG both negative, Neurology consulted, plan to place central venous assess due to lack of IV access  Interim History / Subjective:  -No acute events noted overnight -Afebrile, Hemodynamically stable, NO vasopressors -Urine output 1.3 L last 24 hrs, + 1L -Pt underwent LP yesterday 03/27/21,  initial analysis of CSF not consistent with Bacterial Meningitis, CSF cultures still pending -MRI Brain negative for acute intracranial process -EEG with mild diffuse slowing consistent with generalized neuronal dysfunction, NO SEIZURES -Will consult Neurology   Objective   Blood pressure (!) 131/55, pulse 92, temperature 98.42 F (36.9 C), temperature source Esophageal, resp. rate 17, height 5' 5.98" (1.676 m), weight (!) 144.9 kg, SpO2 97 %.    Vent Mode: PRVC FiO2 (%):  [28 %-30 %] 28 % Set Rate:  [16 bmp] 16 bmp Vt Set:  [450 mL] 450 mL PEEP:  [5 cmH20] 5 cmH20   Intake/Output Summary (Last 24 hours) at 03/28/2021 0824 Last data filed at 03/28/2021 0600 Gross per 24 hour  Intake 4025.99 ml  Output 1375 ml  Net 2650.99 ml   Filed Weights   03/26/21 1729 03/27/21 0500 03/28/21  1771  Weight: (!) 144.2 kg (!) 142.8 kg (!) 144.9 kg    Examination: General: Critically ill-appearing female, laying in bed, intubated sedated, no acute distress HENT:  Atraumatic, normocephalic, neck supple, negative JVD, Lungs: Clear breath sounds throughout, no wheezing or rales noted, even, ventilator assisted, overbreathing the vent at times, even Cardiovascular: Regular rate and rhythm, S1-S2, no murmurs, rubs, gallops, 2+ distal pulses throughout Abdomen: Obese, soft, nontender, nondistended, no guarding or rebound tenderness, bowel sounds positive x4 Extremities: Normal bulk and tone, no deformities, no edema Neuro: Sedated/unresponsive, does not follow commands, no nuchal rigidity, pupils PERRLA GU: Foley catheter in place draining yellow urine  Labs/imaging that I havepersonally reviewed  (right click and "Reselect all SmartList Selections" daily)  04/8: K 3.2, glucose 228, WBC 15.9, Hgb 11.8,  CSF: clear, glucose 100, RBC 82, WBC 5, total protein 39 04/07: MRI Brain>>1. No acute intracranial abnormality. 2. A few small foci of T2 hyperintensity within the white matter of the cerebral hemispheres, nonspecific but may be seen with chronic microvascular ischemic changes. 04/07: EEG>>Impression: This is an abnormal awake and drowsy EEG. Mild diffuse slowing is consistent with generalized neuronal dysfunction. No abnormal discharges or seizures are noted.  04/6: CT Head negative for acute intracranial abnormalities  Resolved Hospital Problem list   N/A  Assessment & Plan:   Mechanical Intubation for airway protection  -Full vent support for now-vent settings reviewed and established  -Wean FiO2 & PEEP as tolerated to maintain O2 sats >92% -Follow intermittent CXR & ABG as needed -SBT once respiratory parameters met and mental status permit  -VAP bundle implemented -Prn Bronchodilators  HTN -Continuous telemetry monitoring  -Resume outpatient aspirin, losartan, and rosuvastatin  -Prn hydralazine for bp management   Hypokalemia  -Monitor I&O's / urinary output -Follow BMP -Ensure adequate renal perfusion -Avoid nephrotoxic agents as  able -Replace electrolytes as indicated -Pharmacy consulted for assistance in electrolyte replacement  Possible meningitis  -Trend WBC and monitor fever curve  -Follow cultures as above -Initial analysis of CSF not consistent with Bacterial Meninginitis, but awaiting cultures results -ID is following, appreciate input -ABX as per ID ~ currently on vancomycin, ceftriaxone, ampicillin, & Acyclovir -Continue Decadron  Acute encephalopathy of unclear etiology possibly secondary to drug overdose vs ? Meningitis ~CT Head 04/6 negative for acute intracranial abnormalities  Mechanical intubation pain/discomfort  -Maintain RASS goal 0 to -1 -Prn propofol gtt and prn fentanyl to maintain RASS goal  -WUA daily  -MRI Brain negative -EEG with nonspecific generalized slowing, NO seizures -Neurology consulted, appreciate input    Best practice (right click and "Reselect all SmartList Selections" daily)  Diet:  NPO, tube feedings Pain/Anxiety/Delirium protocol (if indicated): Yes (RASS goal -1) VAP protocol (if indicated): Yes DVT prophylaxis: LMWH GI prophylaxis: H2B Glucose control:  Yes, SSI Central venous access:  N/A Arterial line:  N/A Foley:  Yes, and it is still needed Mobility:  bed rest  PT consulted: N/A Last date of multidisciplinary goals of care discussion [03/28/2021] Code Status:  full code Disposition: ICU   Labs   CBC: Recent Labs  Lab 03/21/21 1120 03/22/21 1816 03/26/21 1732 03/27/21 0516 03/28/21 0550  WBC 10.2 11.6* 8.8 15.3* 15.9*  NEUTROABS 6.7  --  5.6  --   --   HGB 11.9* 11.9* 12.3 12.8 11.8*  HCT 38.3 37.4 38.4 40.0 36.2  MCV 88.0 86.2 85.0 86.2 84.8  PLT 329 307 290 304 165    Basic Metabolic Panel: Recent Labs  Lab 03/21/21  1120 03/22/21 1816 03/26/21 1732 03/26/21 2109 03/27/21 0516 03/28/21 0550  NA 136 139 135  --  141 141  K 3.7 3.2* 3.4*  --  3.6 3.2*  CL 104 106 99  --  104 109  CO2 21* 23 23  --  21* 23  GLUCOSE 121* 140* 92  --   118* 228*  BUN 10 7* 8  --  7* 12  CREATININE 0.90 0.94 0.97  --  0.90 0.89  CALCIUM 9.2 9.2 9.7  --  9.6 9.0  MG  --   --   --  2.1 2.2 2.1  PHOS  --   --   --  4.5 3.4 2.7   GFR: Estimated Creatinine Clearance: 95.5 mL/min (by C-G formula based on SCr of 0.89 mg/dL). Recent Labs  Lab 03/22/21 1816 03/26/21 1732 03/26/21 2219 03/27/21 0516 03/28/21 0550  PROCALCITON  --   --  <0.10  --   --   WBC 11.6* 8.8  --  15.3* 15.9*    Liver Function Tests: Recent Labs  Lab 03/21/21 1120 03/22/21 1816 03/26/21 1732 03/28/21 0550  AST '17 19 28  ' --   ALT '16 14 22  ' --   ALKPHOS 69 61 71  --   BILITOT 0.6 0.8 1.1  --   PROT 8.1 7.8 8.2*  --   ALBUMIN 4.0 3.9 4.0 3.2*   No results for input(s): LIPASE, AMYLASE in the last 168 hours. Recent Labs  Lab 03/26/21 1732 03/26/21 2109  AMMONIA 10 17    ABG    Component Value Date/Time   PHART 7.37 03/27/2021 0447   PCO2ART 37 03/27/2021 0447   PO2ART 109 (H) 03/27/2021 0447   HCO3 21.4 03/27/2021 0447   ACIDBASEDEF 3.4 (H) 03/27/2021 0447   O2SAT 98.1 03/27/2021 0447     Coagulation Profile: No results for input(s): INR, PROTIME in the last 168 hours.  Cardiac Enzymes: Recent Labs  Lab 03/26/21 2109  CKTOTAL 416*    HbA1C: No results found for: HGBA1C  CBG: Recent Labs  Lab 03/26/21 2201  GLUCAP 99    Review of Systems:   Unable to assess pt mechanically intubated   Past Medical History:  She,  has a past medical history of Anxiety (03/12/2013), Degenerative disc disease, lumbar, Diabetes mellitus without complication ([place]), Endometriosis, Frequent PVCs, Hypertension, Otalgia, unspecified, Palpitations (10/04/2010), and Vascular bruit (01/2013).   Surgical History:   Past Surgical History:  Procedure Laterality Date  . BREAST SURGERY     benign tumor excised-age 21  . CARDIOVASCULAR STRESS TEST  03/13/2008   No scintigraphic evidence of inducible myocardial ischemia, EKG negative for ischemia, no ECG  changes.  . COLONOSCOPY  2009  . COLONOSCOPY N/A 09/21/2019   Procedure: COLONOSCOPY;  Surgeon: Rogene Houston, MD;  Location: [facility];  Service: Endoscopy;  Laterality: N/A;  2:00-10:30  . CYST EXCISION  2007   Scalp  . DOPPLER ECHOCARDIOGRAPHY  03/13/2008   EF >78%, LV systolic function is normal, LV wall function is normal  . LAPAROSCOPIC CHOLECYSTECTOMY  2010  . LUMBAR LAMINECTOMY  2009    L4-L5 and L5-S1 laminectomy with diskectomy; transforaminal  . POLYPECTOMY  09/21/2019   Procedure: POLYPECTOMY;  Surgeon: Rogene Houston, MD;  Location: [facility];  Service: Endoscopy;;  . REPLACEMENT TOTAL KNEE    . SLEEP STUDY  10/29/2009   AHI-1.8/hr, during REM 6.4/hr; RDI-8.0/hr, during REM 15.0/hr; avg oxygen during REM and NREM was 94%  .  TOTAL KNEE ARTHROPLASTY Bilateral   . TUBAL LIGATION       Social History:   reports that she quit smoking about 24 years ago. Her smoking use included cigarettes. She started smoking about 48 years ago. She has a 40.00 pack-year smoking history. She quit smokeless tobacco use about 26 years ago. She reports that she does not drink alcohol and does not use drugs.   Family History:  Her family history includes Diabetes in her brother and mother; Hypertension in her brother, father, and mother.   Allergies Allergies  Allergen Reactions  . Metoprolol Other (See Comments)    Fatigue and malaise; wheezing  . Sulfa Antibiotics      Home Medications  Prior to Admission medications   Medication Sig Start Date End Date Taking? Authorizing Provider  aspirin 81 MG tablet Take 81 mg by mouth daily.    [provider]  cholecalciferol (VITAMIN D3) 25 MCG (1000 UT) tablet Take 1,000 Units by mouth daily.    [provider]  dexlansoprazole (DEXILANT) 60 MG capsule Take 60 mg by mouth daily.    [provider]  diltiazem (CARDIZEM) 30 MG tablet TAKE 1 TABLET (30 MG TOTAL) BY MOUTH 2 (TWO) TIMES DAILY AS NEEDED  (PALPITATIONS). 10/03/20   Arnoldo Lenis, MD  escitalopram (LEXAPRO) 10 MG tablet Take 10 mg by mouth daily.    [provider]  famotidine (PEPCID) 20 MG tablet Take 20 mg by mouth at bedtime. 01/07/20   [provider]  ferrous sulfate 325 (65 FE) MG tablet Take 325 mg by mouth daily with breakfast.     [provider]  fluticasone (FLONASE) 50 MCG/ACT nasal spray Place 2 sprays into both nostrils daily as needed for allergies.  07/24/19   [provider]  furosemide (LASIX) 40 MG tablet Take 40 mg by mouth every other day.     [provider]  HYDROcodone-acetaminophen (NORCO/VICODIN) 5-325 MG per tablet Take 1 tablet by mouth every 4 (four) hours as needed for moderate pain or severe pain.  11/23/14   [provider]  levocetirizine (XYZAL) 5 MG tablet Take 1 tablet by mouth every evening.  07/24/19   [provider]  losartan (COZAAR) 100 MG tablet Take 100 mg by mouth daily.    [provider]  meclizine (ANTIVERT) 25 MG tablet Take 1 tablet (25 mg total) by mouth 3 (three) times daily as needed for dizziness. 03/21/21   Isla Pence, MD  megestrol (MEGACE) 20 MG tablet TAKE 1 TABLET BY MOUTH EVERY DAY 12/29/18   Florian Buff, MD  meloxicam (MOBIC) 15 MG tablet Take 15 mg by mouth daily as needed. 02/05/20   [provider]  polyethylene glycol powder (GLYCOLAX/MIRALAX) powder 1 scoop daily or as needed 06/07/18   Florian Buff, MD  PREMARIN vaginal cream PLACE 1 APPLICATORFUL VAGINALLY DAILY. 12/30/20   Florian Buff, MD  risperiDONE (RISPERDAL) 0.5 MG tablet Take 1 tablet (0.5 mg total) by mouth 2 (two) times daily. 03/24/21   Clapacs, Madie Reno, MD  rosuvastatin (CRESTOR) 5 MG tablet Take 5 mg by mouth daily. 09/28/20   [provider]  verapamil (VERELAN PM) 240 MG 24 hr capsule TAKE 1 CAPSULE BY MOUTH TWICE A DAY 12/02/18   Arnoldo Lenis, MD  vitamin C (ASCORBIC ACID) 500 MG tablet Take 500 mg by mouth  daily.    [provider]     Critical care time: 40 minutes  Darel Hong, AGACNP-BC [facility] Pager: [phone]

## 2021-03-28 NOTE — Consult Note (Signed)
PHARMACY CONSULT NOTE - FOLLOW UP  Pharmacy Consult for Electrolyte Monitoring and Replacement   Recent Labs: Potassium (mmol/L)  Date Value  03/28/2021 3.2 (L)  03/28/2014 3.8   Magnesium (mg/dL)  Date Value  03/28/2021 2.1   Calcium (mg/dL)  Date Value  03/28/2021 9.0   Calcium, Total (mg/dL)  Date Value  03/28/2014 8.4 (L)   Albumin (g/dL)  Date Value  03/28/2021 3.2 (L)   Phosphorus (mg/dL)  Date Value  03/28/2021 2.7   Sodium (mmol/L)  Date Value  03/28/2021 141  03/28/2014 139   Corrected Ca: 9.6  Assessment: CHARMION HAPKE is a 65 y.o. female admitted on 03/26/2021 with un-responsiveness and concern for meningitis. Vitals significant for hypothermia, hypertension, tachycardia. Labs on admission without leukocytosis, no left shift, PCT < 0.10. CT head with no intracranial abnormalities. Pharmacy has been consulted to monitor and replace electrolytes  Goal of Therapy:  Electrolytes WNL  Plan:  --K 3.2 - KCl 40 mEq per tube ordered  --No electrolyte replacement needed at this time  --Monitor electrolyte levels with AM labs  Benn Moulder, PharmD Pharmacy Resident  03/28/2021 8:11 AM

## 2021-03-29 DIAGNOSIS — G9340 Encephalopathy, unspecified: Secondary | ICD-10-CM | POA: Diagnosis not present

## 2021-03-29 DIAGNOSIS — R4182 Altered mental status, unspecified: Secondary | ICD-10-CM | POA: Diagnosis not present

## 2021-03-29 DIAGNOSIS — J9601 Acute respiratory failure with hypoxia: Secondary | ICD-10-CM | POA: Diagnosis not present

## 2021-03-29 LAB — RENAL FUNCTION PANEL
Albumin: 3 g/dL — ABNORMAL LOW (ref 3.5–5.0)
Anion gap: 6 (ref 5–15)
BUN: 21 mg/dL (ref 8–23)
CO2: 25 mmol/L (ref 22–32)
Calcium: 8.9 mg/dL (ref 8.9–10.3)
Chloride: 113 mmol/L — ABNORMAL HIGH (ref 98–111)
Creatinine, Ser: 0.85 mg/dL (ref 0.44–1.00)
GFR, Estimated: >60 mL/min (ref >60)
Glucose, Bld: 167 mg/dL — ABNORMAL HIGH (ref 70–99)
Phosphorus: 3.6 mg/dL (ref 2.5–4.6)
Potassium: 3.8 mmol/L (ref 3.5–5.1)
Sodium: 144 mmol/L (ref 135–145)

## 2021-03-29 LAB — HEAVY METALS, BLOOD
Arsenic: <1 ug/L (ref 0–9)
Lead: 1 ug/dL (ref 0–4)
Mercury: <1 ug/L (ref 0.0–14.9)

## 2021-03-29 LAB — CBC
HCT: 33.7 % — ABNORMAL LOW (ref 36.0–46.0)
Hemoglobin: 10.5 g/dL — ABNORMAL LOW (ref 12.0–15.0)
MCH: 27.1 pg (ref 26.0–34.0)
MCHC: 31.2 g/dL (ref 30.0–36.0)
MCV: 86.9 fL (ref 80.0–100.0)
Platelets: 266 10*3/uL (ref 150–400)
RBC: 3.88 MIL/uL (ref 3.87–5.11)
RDW: 14.5 % (ref 11.5–15.5)
WBC: 16.6 10*3/uL — ABNORMAL HIGH (ref 4.0–10.5)
nRBC: 0 % (ref 0.0–0.2)

## 2021-03-29 LAB — GLUCOSE, CAPILLARY
Glucose-Capillary: 161 mg/dL — ABNORMAL HIGH (ref 70–99)
Glucose-Capillary: 159 mg/dL — ABNORMAL HIGH (ref 70–99)
Glucose-Capillary: 144 mg/dL — ABNORMAL HIGH (ref 70–99)
Glucose-Capillary: 100 mg/dL — ABNORMAL HIGH (ref 70–99)
Glucose-Capillary: 111 mg/dL — ABNORMAL HIGH (ref 70–99)

## 2021-03-29 LAB — HEMOGLOBIN A1C
Hgb A1c MFr Bld: 6.4 % — ABNORMAL HIGH (ref 4.8–5.6)
Mean Plasma Glucose: 136.98 mg/dL

## 2021-03-29 LAB — MAGNESIUM: Magnesium: 2.3 mg/dL (ref 1.7–2.4)

## 2021-03-29 MED ORDER — RISPERIDONE 0.5 MG PO TABS: 0.5000 mg | ORAL_TABLET | Freq: Two times a day (BID) | ORAL | Status: DC

## 2021-03-29 MED ORDER — DIVALPROEX SODIUM 250 MG PO DR TAB
250.0000 mg | DELAYED_RELEASE_TABLET | Freq: Two times a day (BID) | ORAL | Status: DC
  Administered 2021-03-30 – 2021-04-02 (×7): 250 mg via ORAL
  Filled 2021-03-29 (×8): qty 1

## 2021-03-29 MED ORDER — FENTANYL CITRATE (PF) 100 MCG/2ML IJ SOLN
25.0000 ug | INTRAMUSCULAR | Status: DC | PRN
Start: 2021-03-29 — End: 2021-03-29

## 2021-03-29 MED ORDER — CHLORHEXIDINE GLUCONATE 0.12 % MT SOLN
15.0000 mL | Freq: Two times a day (BID) | OROMUCOSAL | Status: DC
  Administered 2021-03-29 – 2021-04-10 (×21): 15 mL via OROMUCOSAL
  Filled 2021-03-29 (×23): qty 15

## 2021-03-29 MED ORDER — POLYETHYLENE GLYCOL 3350 17 G PO PACK: 17.0000 g | PACK | Freq: Every day | ORAL | Status: DC | PRN

## 2021-03-29 MED ORDER — DOCUSATE SODIUM 50 MG/5ML PO LIQD
100.0000 mg | Freq: Two times a day (BID) | ORAL | Status: DC | PRN
  Filled 2021-03-29: qty 10

## 2021-03-29 MED ORDER — IPRATROPIUM-ALBUTEROL 0.5-2.5 (3) MG/3ML IN SOLN: 3.0000 mL | Freq: Four times a day (QID) | RESPIRATORY_TRACT | Status: DC | PRN

## 2021-03-29 MED ORDER — ORAL CARE MOUTH RINSE
15.0000 mL | Freq: Two times a day (BID) | OROMUCOSAL | Status: DC
  Administered 2021-03-29 – 2021-04-10 (×13): 15 mL via OROMUCOSAL

## 2021-03-29 MED ORDER — VITAMIN D 25 MCG (1000 UNIT) PO TABS
1000.0000 [IU] | ORAL_TABLET | Freq: Every day | ORAL | Status: DC
  Administered 2021-03-29 – 2021-03-31 (×3): 1000 [IU]
  Filled 2021-03-29 (×4): qty 1

## 2021-03-29 MED ORDER — FERROUS SULFATE 220 (44 FE) MG/5ML PO ELIX
325.0000 mg | ORAL_SOLUTION | Freq: Every day | ORAL | Status: DC
  Filled 2021-03-29: qty 7.4

## 2021-03-29 MED ORDER — VALPROATE SODIUM 100 MG/ML IV SOLN
500.0000 mg | Freq: Once | INTRAVENOUS | Status: AC
  Administered 2021-03-29: 500 mg via INTRAVENOUS
  Filled 2021-03-29: qty 5

## 2021-03-29 NOTE — Consult Note (Signed)
PHARMACY CONSULT NOTE - FOLLOW UP  Pharmacy Consult for Electrolyte Monitoring and Replacement   Recent Labs: Potassium (mmol/L)  Date Value  03/29/2021 3.8  03/28/2014 3.8   Magnesium (mg/dL)  Date Value  03/29/2021 2.3   Calcium (mg/dL)  Date Value  03/29/2021 8.9   Calcium, Total (mg/dL)  Date Value  03/28/2014 8.4 (L)   Albumin (g/dL)  Date Value  03/29/2021 3.0 (L)   Phosphorus (mg/dL)  Date Value  03/29/2021 3.6   Sodium (mmol/L)  Date Value  03/29/2021 144  03/28/2014 139    Assessment: Megan Oneal is a 65 y.o. female admitted on 03/26/2021 with un-responsiveness and concern for meningitis. Vitals significant for hypothermia, hypertension, tachycardia. Labs on admission without leukocytosis, no left shift, PCT < 0.10. CT head with no intracranial abnormalities. Pharmacy has been consulted to monitor and replace electrolytes  Goal of Therapy:  Electrolytes WNL  Plan:  --No electrolyte replacement needed at this time  --Monitor electrolyte levels with AM labs  Oswald Hillock, PharmD 03/29/2021 8:14 AM

## 2021-03-29 NOTE — Progress Notes (Signed)
Patient alert.  Refuses for nurse to assess her pupils.  Does not believe that her son is at bedside even though sister tells her he is and patients son is at bedside presently. No respiratory distress. 96% on room air. Sinus tach on the monitor rate 1 teens. Continuing to monitor.

## 2021-03-29 NOTE — Progress Notes (Signed)
Patient is alert to self and place. When asked why she is here in the hospital she stated "because they think I'm loco." Patient has been paranoid and suspicious of staff. Verbalized that she is fearful we are trying to kill her. This nurse attempted to reassure patient that we are here to help her. Report given Clarise Cruz, RN.

## 2021-03-29 NOTE — Progress Notes (Addendum)
NAME:  Megan Oneal, MRN:  [id number], DOB:  [birth year], LOS: 3 ADMISSION DATE:  03/26/2021, CONSULTATION DATE: 03/26/2021 REFERRING MD: Dr. Tamala Julian, CHIEF COMPLAINT: Altered Mental Status   Brief Pt Description:  65 y.o. Female admitted with Acute Metabolic Encephalopathy in setting of possible Meninginitis vs. Drug overdose (? Flexeril), along with Acute Hypoxic Respiratory Failure requiring intubation.  History of Present Illness:  This is a 65 yo female who presented to [facility] ER on 04/6 via EMS with unresponsiveness with reported last well known time at 06:30 am on 04/6.  Family also reported finding a flexiril bottle near the pt.  She recently presented to [facility] ER on 04/4 with auditory hallucinations that started a couple of months prior to that ER presentation.  At that time she was evaluated by psychiatry and denied suicidal or homicidal ideations  She did not require hospital admission but was instructed to start Risperdal 0.5 mg bid and to follow-up with her PCP and Otolaryngologist.    ED Course Upon arrival to the ER pt tachycardic but stable on RA.  Per ER notes pt able to withdraw from noxious stimuli and pupils were symmetric/reactive.  EKG revealed sinus tachycardia, hr 103 but no signs of ischemia.  Respiratory Panel by RT-PCR negative, however CXR concerning for bibasilar atelectasis.  CT Head negative for acute intracranial abnormalities.  Lab results revealed K+ 3.4, acetaminophen level <25, salicylate level <3.6, TSH 0.694, urine drug positive for tricyclic's, and vbg pH 6.44/IHK7 36/bicarb 23.9.  She was subsequently mechanically intubated.  ED physician attempted lumbar puncture, however was unsuccessful.  Pt received acyclovir, vancomycin, and ceftriaxone for meningitis coverage. PCCM team contacted for ICU admission.    Pertinent  Medical History  Right Neck Vascular Bruit Palpitations Otalgia HTN Endometriosis  Type II Diabetes Mellitus Degenerative Disc  Disease Anxiety Depression    Cultures:  03/26/2021: SARS-CoV-2 PCR>> negative 03/27/2019: Influenza PCR>> negative 03/26/2021: Blood culture x2>> 03/27/2021: CSF>>  Antimicrobials:  . Vancomycin 04/6>>stopped . Ceftriaxone 04/6>>stopped . Acyclovir 04/6>>stopped . Ampicillin 04/6>>stopped  Significant Hospital Events: Including procedures, antibiotic start and stop dates in addition to other pertinent events   . ER physician attempted lumbar puncture, however was unsuccessful 03/26/2021: Pt admitted to ICU with acute encephalopathy of unknown etiology mechanically intubated 03/28/2021: Underwent LP on 4/7, initial analysis of CSF not consistent with Bacterial Meningitis, MRI and EEG both negative, Neurology consulted, plan to place central venous assess due to lack of IV access  Interim History / Subjective:  -No acute events noted overnight -Afebrile, Hemodynamically stable, NO vasopressors -Pt underwent LP yesterday 03/27/21,  initial analysis of CSF not consistent with Bacterial Meningitis, CSF cultures still pending -MRI Brain negative for acute intracranial process -EEG with mild diffuse slowing consistent with generalized neuronal dysfunction, NO SEIZURES -Patient was able to follow commands last night and this morning when sedation briefly held   Objective   Blood pressure 135/72, pulse 79, temperature (!) 96.98 F (36.1 C), resp. rate 17, height 5' 5.98" (1.676 m), weight (!) 144.9 kg, SpO2 97 %.    Vent Mode: PRVC FiO2 (%):  [28 %] 28 % Set Rate:  [16 bmp] 16 bmp Vt Set:  [450 mL] 450 mL PEEP:  [5 cmH20] 5 cmH20 Plateau Pressure:  [14 cmH20] 14 cmH20   Intake/Output Summary (Last 24 hours) at 03/29/2021 1038 Last data filed at 03/29/2021 0848 Gross per 24 hour  Intake 2865.03 ml  Output 1745 ml  Net 1120.03 ml   [employer]  03/26/21 1729 03/27/21 0500 03/28/21 0439  Weight: (!) 144.2 kg (!) 142.8 kg (!) 144.9 kg    Examination: General: Critically ill-appearing  female, laying in bed, intubated sedated, no acute distress HENT: Atraumatic, normocephalic, neck supple, negative JVD, Lungs: Clear breath sounds throughout, no wheezing or rales noted, even, ventilator assisted, overbreathing the vent at times, even Cardiovascular: Regular rate and rhythm, S1-S2, no murmurs, rubs, gallops, 2+ distal pulses throughout Abdomen: Obese, soft, nontender, nondistended, no guarding or rebound tenderness, bowel sounds positive x4 Extremities: Normal bulk and tone, no deformities, no edema Neuro: Sedated/unresponsive, does not follow commands, no nuchal rigidity, pupils PERRLA GU: Foley catheter in place draining yellow urine  Labs/imaging that I havepersonally reviewed  (right click and "Reselect all SmartList Selections" daily)  04/8: K 3.2, glucose 228, WBC 15.9, Hgb 11.8,  CSF: clear, glucose 100, RBC 82, WBC 5, total protein 39 04/07: MRI Brain>>1. No acute intracranial abnormality. 2. A few small foci of T2 hyperintensity within the white matter of the cerebral hemispheres, nonspecific but may be seen with chronic microvascular ischemic changes. 04/07: EEG>>Impression: This is an abnormal awake and drowsy EEG. Mild diffuse slowing is consistent with generalized neuronal dysfunction. No abnormal discharges or seizures are noted.  04/6: CT Head negative for acute intracranial abnormalities  Resolved Hospital Problem list   N/A  Assessment & Plan:   Mechanical Intubation for airway protection  -Full vent support for now-vent settings reviewed and established  -Wean FiO2 & PEEP as tolerated to maintain O2 sats >92% -Follow intermittent CXR & ABG as needed -Will attempt SBT this morning since her mentals status appears to be improving off sedation and respiratory parameters have been met -VAP bundle implemented -Prn Bronchodilators  HTN -Continuous telemetry monitoring  -Resume outpatient aspirin, losartan, and rosuvastatin  -Prn hydralazine for bp  management   Hypokalemia  -Monitor I&O's / urinary output -Follow BMP -Ensure adequate renal perfusion -Avoid nephrotoxic agents as able -Replace electrolytes as indicated -Pharmacy consulted for assistance in electrolyte replacement  Possible CNS infection - initial concerns for Meningitis -Trend WBC and monitor fever curve  -Follow cultures as above -Initial analysis of CSF not consistent with Bacterial Meninginitis, but awaiting cultures results -Initially on ABX as per ID - vancomycin, ceftriaxone, ampicillin, & Acyclovir but this has bee discontinued as there is low suspicion for meningitis at this time. -Continue Decadron -ID is following, appreciate input  Acute encephalopathy of unclear etiology possibly secondary to drug overdose vs  CNS infection, stroke ~CT Head 04/6 negative for acute intracranial abnormalities  Mechanical intubation pain/discomfort  -Maintain RASS goal 0 to -1 -Prn propofol gtt and prn fentanyl to maintain RASS goal  -WUA daily  -MRI Brain negative -EEG with nonspecific generalized slowing, NO seizures -Neurology consulted, appreciate input  Remote hx of ?Otosclerosis with auditory hallucinations -Previously referred to Otolaryngology at [place] when she presented to the ED with Vertigo (4/1) and Paranoia (4/2). It appears patient has not followed with them. -Psych Consult for drug overdose. Patient may have overdosed on Flexeril per son in-law who found empty bottles. UDS + for TCA    Best practice (right click and "Reselect all SmartList Selections" daily)  Diet:  NPO, tube feedings Pain/Anxiety/Delirium protocol (if indicated): Yes (RASS goal -1) VAP protocol (if indicated): Yes DVT prophylaxis: LMWH GI prophylaxis: H2B Glucose control:  Yes, SSI Central venous access:  N/A Arterial line:  N/A Foley:  Yes, and it is still needed Mobility:  bed rest  PT consulted: N/A   Last date of multidisciplinary goals of care discussion  [03/28/2021] Code Status:  full code Disposition: ICU   Labs   CBC: Recent Labs  Lab 03/22/21 1816 03/26/21 1732 03/27/21 0516 03/28/21 0550 03/29/21 0454  WBC 11.6* 8.8 15.3* 15.9* 16.6*  NEUTROABS  --  5.6  --   --   --   HGB 11.9* 12.3 12.8 11.8* 10.5*  HCT 37.4 38.4 40.0 36.2 33.7*  MCV 86.2 85.0 86.2 84.8 86.9  PLT 307 290 304 292 858    Basic Metabolic Panel: Recent Labs  Lab 03/22/21 1816 03/26/21 1732 03/26/21 2109 03/27/21 0516 03/28/21 0550 03/28/21 2121 03/29/21 0454  NA 139 135  --  141 141  --  144  K 3.2* 3.4*  --  3.6 3.2* 3.4* 3.8  CL 106 99  --  104 109  --  113*  CO2 23 23  --  21* 23  --  25  GLUCOSE 140* 92  --  118* 228*  --  167*  BUN 7* 8  --  7* 12  --  21  CREATININE 0.94 0.97  --  0.90 0.89  --  0.85  CALCIUM 9.2 9.7  --  9.6 9.0  --  8.9  MG  --   --  2.1 2.2 2.1  --  2.3  PHOS  --   --  4.5 3.4 2.7  --  3.6   GFR: Estimated Creatinine Clearance: 100 mL/min (by C-G formula based on SCr of 0.85 mg/dL). Recent Labs  Lab 03/26/21 1732 03/26/21 2219 03/27/21 0516 03/28/21 0550 03/29/21 0454  PROCALCITON  --  <0.10  --   --   --   WBC 8.8  --  15.3* 15.9* 16.6*    Liver Function Tests: Recent Labs  Lab 03/22/21 1816 03/26/21 1732 03/28/21 0550 03/29/21 0454  AST 19 28  --   --   ALT 14 22  --   --   ALKPHOS 61 71  --   --   BILITOT 0.8 1.1  --   --   PROT 7.8 8.2*  --   --   ALBUMIN 3.9 4.0 3.2* 3.0*   No results for input(s): LIPASE, AMYLASE in the last 168 hours. Recent Labs  Lab 03/26/21 1732 03/26/21 2109  AMMONIA 10 17    ABG    Component Value Date/Time   PHART 7.37 03/27/2021 0447   PCO2ART 37 03/27/2021 0447   PO2ART 109 (H) 03/27/2021 0447   HCO3 21.4 03/27/2021 0447   ACIDBASEDEF 3.4 (H) 03/27/2021 0447   O2SAT 98.1 03/27/2021 0447     Coagulation Profile: No results for input(s): INR, PROTIME in the last 168 hours.  Cardiac Enzymes: Recent Labs  Lab 03/26/21 2109  CKTOTAL 416*     HbA1C: No results found for: HGBA1C  CBG: Recent Labs  Lab 03/28/21 1612 03/28/21 1924 03/28/21 2335 03/29/21 0400 03/29/21 0741  GLUCAP 184* 205* 166* 161* 159*    Review of Systems:   Unable to assess pt mechanically intubated   Past Medical History:  She,  has a past medical history of Anxiety (03/12/2013), Degenerative disc disease, lumbar, Diabetes mellitus without complication ([place]), Endometriosis, Frequent PVCs, Hypertension, Otalgia, unspecified, Palpitations (10/04/2010), and Vascular bruit (01/2013).   Surgical History:   Past Surgical History:  Procedure Laterality Date  . BREAST SURGERY     benign tumor excised-age 65  . CARDIOVASCULAR STRESS TEST  03/13/2008   No scintigraphic evidence of inducible myocardial ischemia, EKG  negative for ischemia, no ECG changes.  . COLONOSCOPY  2009  . COLONOSCOPY N/A 09/21/2019   Procedure: COLONOSCOPY;  Surgeon: Rehman, Najeeb U, MD;  Location: [facility];  Service: Endoscopy;  Laterality: N/A;  2:00-10:30  . CYST EXCISION  2007   Scalp  . DOPPLER ECHOCARDIOGRAPHY  03/13/2008   EF >55%, LV systolic function is normal, LV wall function is normal  . LAPAROSCOPIC CHOLECYSTECTOMY  2010  . LUMBAR LAMINECTOMY  2009    L4-L5 and L5-S1 laminectomy with diskectomy; transforaminal  . POLYPECTOMY  09/21/2019   Procedure: POLYPECTOMY;  Surgeon: Rehman, Najeeb U, MD;  Location: [facility];  Service: Endoscopy;;  . REPLACEMENT TOTAL KNEE    . SLEEP STUDY  10/29/2009   AHI-1.8/hr, during REM 6.4/hr; RDI-8.0/hr, during REM 15.0/hr; avg oxygen during REM and NREM was 94%  . TOTAL KNEE ARTHROPLASTY Bilateral   . TUBAL LIGATION       Social History:   reports that she quit smoking about 24 years ago. Her smoking use included cigarettes. She started smoking about 48 years ago. She has a 40.00 pack-year smoking history. She quit smokeless tobacco use about 26 years ago. She reports that she does not drink alcohol and does not use drugs.    Family History:  Her family history includes Diabetes in her brother and mother; Hypertension in her brother, father, and mother.   Allergies Allergies  Allergen Reactions  . Metoprolol Other (See Comments)    Fatigue and malaise; wheezing  . Sulfa Antibiotics      Home Medications  Prior to Admission medications   Medication Sig Start Date End Date Taking? Authorizing Provider  aspirin 81 MG tablet Take 81 mg by mouth daily.    [provider]  cholecalciferol (VITAMIN D3) 25 MCG (1000 UT) tablet Take 1,000 Units by mouth daily.    [provider]  dexlansoprazole (DEXILANT) 60 MG capsule Take 60 mg by mouth daily.    [provider]  diltiazem (CARDIZEM) 30 MG tablet TAKE 1 TABLET (30 MG TOTAL) BY MOUTH 2 (TWO) TIMES DAILY AS NEEDED (PALPITATIONS). 10/03/20   Branch, Jonathan F, MD  escitalopram (LEXAPRO) 10 MG tablet Take 10 mg by mouth daily.    [provider]  famotidine (PEPCID) 20 MG tablet Take 20 mg by mouth at bedtime. 01/07/20   [provider]  ferrous sulfate 325 (65 FE) MG tablet Take 325 mg by mouth daily with breakfast.     [provider]  fluticasone (FLONASE) 50 MCG/ACT nasal spray Place 2 sprays into both nostrils daily as needed for allergies.  07/24/19   [provider]  furosemide (LASIX) 40 MG tablet Take 40 mg by mouth every other day.     [provider]  HYDROcodone-acetaminophen (NORCO/VICODIN) 5-325 MG per tablet Take 1 tablet by mouth every 4 (four) hours as needed for moderate pain or severe pain.  11/23/14   [provider]  levocetirizine (XYZAL) 5 MG tablet Take 1 tablet by mouth every evening.  07/24/19   [provider]  losartan (COZAAR) 100 MG tablet Take 100 mg by mouth daily.    [provider]  meclizine (ANTIVERT) 25 MG tablet Take 1 tablet (25 mg total) by mouth 3 (three) times daily as needed for dizziness. 03/21/21   Haviland, Julie, MD  megestrol  (MEGACE) 20 MG tablet TAKE 1 TABLET BY MOUTH EVERY DAY 12/29/18   Eure, Luther H, MD  meloxicam (MOBIC) 15 MG tablet Take   15 mg by mouth daily as needed. 02/05/20   [provider]  polyethylene glycol powder (GLYCOLAX/MIRALAX) powder 1 scoop daily or as needed 06/07/18   Eure, Luther H, MD  PREMARIN vaginal cream PLACE 1 APPLICATORFUL VAGINALLY DAILY. 12/30/20   Eure, Luther H, MD  risperiDONE (RISPERDAL) 0.5 MG tablet Take 1 tablet (0.5 mg total) by mouth 2 (two) times daily. 03/24/21   Clapacs, John T, MD  rosuvastatin (CRESTOR) 5 MG tablet Take 5 mg by mouth daily. 09/28/20   [provider]  verapamil (VERELAN PM) 240 MG 24 hr capsule TAKE 1 CAPSULE BY MOUTH TWICE A DAY 12/02/18   Branch, Jonathan F, MD  vitamin C (ASCORBIC ACID) 500 MG tablet Take 500 mg by mouth daily.    [provider]     Critical care time: 40 minutes          , DNP, CCRN, FNP-C, AGACNP-BC Acute Care Nurse Practitioner  [facility] Pager: [phone] [facility]   

## 2021-03-29 NOTE — Progress Notes (Addendum)
Patient met criteria for liberation from the ventilator. She passed SBT and was successfully extubated to room air at 12:55pm. No stridors or s/s of respiratory distress noted. She remains, awake, alert and nods appropriately to orientation questions. Family at the bedside.   Rufina Falco, DNP, CCRN, FNP-C, AGACNP-BC Acute Care Nurse Practitioner  [facility] Pager: [phone] [place] [facility]

## 2021-03-29 NOTE — Progress Notes (Signed)
Extubated to room air by Marzetta Board, RT per Dr. Domingo Dimes order. Patient alert but drowsy, following commands.  Sister and son at bedside.

## 2021-03-30 DIAGNOSIS — R4182 Altered mental status, unspecified: Secondary | ICD-10-CM | POA: Diagnosis not present

## 2021-03-30 DIAGNOSIS — G9340 Encephalopathy, unspecified: Secondary | ICD-10-CM | POA: Diagnosis not present

## 2021-03-30 DIAGNOSIS — R440 Auditory hallucinations: Secondary | ICD-10-CM | POA: Diagnosis not present

## 2021-03-30 LAB — BASIC METABOLIC PANEL
Anion gap: 6 (ref 5–15)
BUN: 19 mg/dL (ref 8–23)
CO2: 28 mmol/L (ref 22–32)
Calcium: 8.6 mg/dL — ABNORMAL LOW (ref 8.9–10.3)
Chloride: 111 mmol/L (ref 98–111)
Creatinine, Ser: 0.81 mg/dL (ref 0.44–1.00)
GFR, Estimated: >60 mL/min (ref >60)
Glucose, Bld: 109 mg/dL — ABNORMAL HIGH (ref 70–99)
Potassium: 3.1 mmol/L — ABNORMAL LOW (ref 3.5–5.1)
Sodium: 145 mmol/L (ref 135–145)

## 2021-03-30 LAB — CSF CULTURE W GRAM STAIN: Culture: NO GROWTH

## 2021-03-30 LAB — GLUCOSE, CAPILLARY
Glucose-Capillary: 107 mg/dL — ABNORMAL HIGH (ref 70–99)
Glucose-Capillary: 107 mg/dL — ABNORMAL HIGH (ref 70–99)
Glucose-Capillary: 111 mg/dL — ABNORMAL HIGH (ref 70–99)
Glucose-Capillary: 113 mg/dL — ABNORMAL HIGH (ref 70–99)
Glucose-Capillary: 130 mg/dL — ABNORMAL HIGH (ref 70–99)
Glucose-Capillary: 98 mg/dL (ref 70–99)
Glucose-Capillary: 99 mg/dL (ref 70–99)

## 2021-03-30 LAB — CBC
HCT: 33 % — ABNORMAL LOW (ref 36.0–46.0)
Hemoglobin: 10.7 g/dL — ABNORMAL LOW (ref 12.0–15.0)
MCH: 27.8 pg (ref 26.0–34.0)
MCHC: 32.4 g/dL (ref 30.0–36.0)
MCV: 85.7 fL (ref 80.0–100.0)
Platelets: 271 10*3/uL (ref 150–400)
RBC: 3.85 MIL/uL — ABNORMAL LOW (ref 3.87–5.11)
RDW: 14.6 % (ref 11.5–15.5)
WBC: 14.1 10*3/uL — ABNORMAL HIGH (ref 4.0–10.5)
nRBC: 0 % (ref 0.0–0.2)

## 2021-03-30 LAB — MAGNESIUM: Magnesium: 2.1 mg/dL (ref 1.7–2.4)

## 2021-03-30 LAB — PHOSPHORUS: Phosphorus: 2.2 mg/dL — ABNORMAL LOW (ref 2.5–4.6)

## 2021-03-30 MED ORDER — LOSARTAN POTASSIUM 50 MG PO TABS
100.0000 mg | ORAL_TABLET | Freq: Every day | ORAL | Status: DC
  Administered 2021-03-30 – 2021-04-04 (×5): 100 mg via ORAL
  Filled 2021-03-30 (×6): qty 2

## 2021-03-30 MED ORDER — POLYETHYLENE GLYCOL 3350 17 G PO PACK: 17.0000 g | PACK | Freq: Every day | ORAL | Status: DC | PRN

## 2021-03-30 MED ORDER — ROSUVASTATIN CALCIUM 10 MG PO TABS
5.0000 mg | ORAL_TABLET | Freq: Every day | ORAL | Status: DC
  Administered 2021-03-30 – 2021-04-01 (×3): 5 mg via ORAL
  Filled 2021-03-30 (×3): qty 1

## 2021-03-30 MED ORDER — RISPERIDONE 1 MG PO TABS
2.0000 mg | ORAL_TABLET | Freq: Every day | ORAL | Status: DC
  Administered 2021-03-30 – 2021-04-03 (×5): 2 mg via ORAL
  Filled 2021-03-30 (×5): qty 2

## 2021-03-30 MED ORDER — ADULT MULTIVITAMIN LIQUID CH
15.0000 mL | Freq: Every day | ORAL | Status: DC
  Administered 2021-03-30 – 2021-03-31 (×2): 15 mL via ORAL
  Filled 2021-03-30 (×2): qty 15

## 2021-03-30 MED ORDER — POTASSIUM CHLORIDE 10 MEQ/50ML IV SOLN
10.0000 meq | INTRAVENOUS | Status: AC
  Administered 2021-03-30 (×2): 10 meq via INTRAVENOUS
  Filled 2021-03-30 (×2): qty 50

## 2021-03-30 MED ORDER — ASPIRIN 81 MG PO CHEW
81.0000 mg | CHEWABLE_TABLET | Freq: Every day | ORAL | Status: DC
  Administered 2021-03-30 – 2021-04-10 (×12): 81 mg via ORAL
  Filled 2021-03-30 (×12): qty 1

## 2021-03-30 MED ORDER — ASCORBIC ACID 500 MG PO TABS
500.0000 mg | ORAL_TABLET | Freq: Every day | ORAL | Status: DC
  Administered 2021-03-30 – 2021-04-01 (×3): 500 mg via ORAL
  Filled 2021-03-30 (×3): qty 1

## 2021-03-30 MED ORDER — K PHOS MONO-SOD PHOS DI & MONO 155-852-130 MG PO TABS
500.0000 mg | ORAL_TABLET | ORAL | Status: AC
  Administered 2021-03-30: 500 mg via ORAL
  Filled 2021-03-30 (×2): qty 2

## 2021-03-30 MED ORDER — FERROUS SULFATE 220 (44 FE) MG/5ML PO ELIX
325.0000 mg | ORAL_SOLUTION | Freq: Every day | ORAL | Status: DC
  Administered 2021-03-31: 325 mg via ORAL
  Filled 2021-03-30: qty 7.39
  Filled 2021-03-30: qty 7.4

## 2021-03-30 MED ORDER — POTASSIUM CHLORIDE CRYS ER 20 MEQ PO TBCR
40.0000 meq | EXTENDED_RELEASE_TABLET | Freq: Two times a day (BID) | ORAL | Status: DC
  Administered 2021-03-30: 40 meq via ORAL
  Filled 2021-03-30 (×2): qty 2

## 2021-03-30 MED ORDER — FERROUS SULFATE 220 (44 FE) MG/5ML PO ELIX: 325.0000 mg | ORAL_SOLUTION | Freq: Every day | ORAL | Status: DC

## 2021-03-30 MED ORDER — IBUPROFEN 400 MG PO TABS
600.0000 mg | ORAL_TABLET | Freq: Four times a day (QID) | ORAL | Status: DC | PRN
  Administered 2021-03-30 – 2021-04-01 (×3): 600 mg via ORAL
  Filled 2021-03-30 (×3): qty 2

## 2021-03-30 NOTE — Consult Note (Signed)
PHARMACY CONSULT NOTE - FOLLOW UP  Pharmacy Consult for Electrolyte Monitoring and Replacement   Recent Labs: Potassium (mmol/L)  Date Value  03/30/2021 3.1 (L)  03/28/2014 3.8   Magnesium (mg/dL)  Date Value  03/30/2021 2.1   Calcium (mg/dL)  Date Value  03/30/2021 8.6 (L)   Calcium, Total (mg/dL)  Date Value  03/28/2014 8.4 (L)   Albumin (g/dL)  Date Value  03/29/2021 3.0 (L)   Phosphorus (mg/dL)  Date Value  03/30/2021 2.2 (L)   Sodium (mmol/L)  Date Value  03/30/2021 145  03/28/2014 139    Assessment: Megan Oneal is a 65 y.o. female admitted on 03/26/2021 with un-responsiveness and concern for meningitis. Vitals significant for hypothermia, hypertension, tachycardia. Labs on admission without leukocytosis, no left shift, PCT < 0.10. CT head with no intracranial abnormalities. Pharmacy has been consulted to monitor and replace electrolytes. On losartan, which can increase potassium levels.   On LR @75  mL/hr.   Goal of Therapy:  Electrolytes WNL  Plan:  Will give KCl 40 mEq x 2 PO and KPhos 2 tabs x 2.  --Monitor electrolyte levels with AM labs  Oswald Hillock, PharmD 03/30/2021 7:34 AM

## 2021-03-30 NOTE — Progress Notes (Addendum)
NAME:  Megan Oneal, MRN:  [id number], DOB:  1956/10/19, LOS: 4 ADMISSION DATE:  03/26/2021, CONSULTATION DATE: 03/26/2021 REFERRING MD: Dr. Tamala Julian, CHIEF COMPLAINT: Altered Mental Status   Brief HPI  This is a 65 yo female who recently presented to [facility] ER on 04/4 with auditory hallucinations that started a couple of months prior to that ER presentation.  At that time she was evaluated by psychiatry and denied suicidal or homicidal ideations  She did not require hospital admission but was instructed to start Risperdal 0.5 mg bid and to follow-up with her PCP and Otolaryngologist. She presented again to [facility] ER on 04/6 via EMS with unresponsiveness with reported last well known time at 06:30 am on 04/6.  Family also reported finding a flexiril bottle near the pt. She was admitted with Acute Metabolic Encephalopathy in setting of suspected CNS infection possible Meninginitis vs. Drug overdose (? Flexeril), along with Acute Hypoxic Respiratory Failure requiring intubation.  Pertinent  Medical History  Right Neck Vascular Bruit Palpitations Otalgia HTN Endometriosis  Type II Diabetes Mellitus Degenerative Disc Disease Anxiety Depression   Cultures:  03/26/2021: SARS-CoV-2 PCR>> negative 03/27/2019: Influenza PCR>> negative 03/26/2021: Blood culture x2>> 03/27/2021: CSF>>revealing normal WBC and protein. Glucose was not low and the sample was clear and colorless. HSV 1/2 PCR >negative  Antimicrobials:  Vancomycin 04/6>>stopped Ceftriaxone 04/6>>stopped Acyclovir 04/6>>stopped Ampicillin 04/6>>stopped  Significant Hospital Events: Including procedures, antibiotic start and stop dates in addition to other pertinent events   ER physician attempted lumbar puncture, however was unsuccessful 03/26/2021: Pt admitted to ICU with acute encephalopathy of unknown etiology mechanically intubated 03/28/2021: Underwent LP on 4/7, initial analysis of CSF not consistent with Bacterial Meningitis, MRI and EEG both  negative, Neurology consulted, plan to place central venous assess due to lack of IV access 03/29/2021: s/p Extubation to RA  Interim History / Subjective:  -No acute events noted overnight -Afebrile, Hemodynamically stable, NO vasopressors -Pt underwent LP yesterday 03/27/21,  initial analysis of CSF not consistent with Bacterial Meningitis, CSF cultures still pending -MRI Brain negative for acute intracranial process -EEG with mild diffuse slowing consistent with generalized neuronal dysfunction, NO SEIZURES -Patient extubated 4/9 to RA, alert and oriented with intermittent episodes of delusional thoughts. Able to follow commands  Objective   Blood pressure (!) 129/111, pulse 93, temperature 99.8 F (37.7 C), temperature source Oral, resp. rate 19, height 5' 5.98" (1.676 m), weight (!) 140.1 kg, SpO2 94 %.    Vent Mode: PSV;CPAP FiO2 (%):  [28 %] 28 % Set Rate:  [16 bmp] 16 bmp Vt Set:  [450 mL] 450 mL PEEP:  [5 cmH20] 5 cmH20 Pressure Support:  [5 cmH20] 5 cmH20 Plateau Pressure:  [9 cmH20-14 cmH20] 9 cmH20   Intake/Output Summary (Last 24 hours) at 03/30/2021 6967 Last data filed at 03/30/2021 0600 Gross per 24 hour  Intake 2251.92 ml  Output 2640 ml  Net -388.08 ml   Filed Weights   03/27/21 0500 03/28/21 0439 03/30/21 0429  Weight: (!) 142.8 kg (!) 144.9 kg (!) 140.1 kg    Physical Examination: GENERAL: 65 year old patient lying in the bed with no acute distress.  EYES: Pupils equal, round, reactive to light and accommodation. No scleral icterus. Extraocular muscles intact.  HEENT: Head atraumatic, normocephalic. Oropharynx and nasopharynx clear.  NECK:  Supple, no jugular venous distention. No thyroid enlargement, no tenderness.  LUNGS: Normal breath sounds bilaterally, no wheezing, rales,rhonchi or crepitation. No use of accessory muscles of respiration.  CARDIOVASCULAR: S1, S2 normal.  No murmurs, rubs, or gallops.  ABDOMEN: Soft, nontender, nondistended. Bowel sounds  present. No organomegaly or mass.  EXTREMITIES: No pedal edema, cyanosis, or clubbing.  NEUROLOGIC: Cranial nerves II through XII are intact. Except speech is mildly garbled but comprehensible. Muscle strength 5/5 in all extremities. Sensation intact. Gait not checked.  PSYCHIATRIC: The patient is alert and oriented x 2. Intermittent hallucinations and delusional thoughts SKIN: No obvious rash, lesion, or ulcer.   Labs/imaging that I havepersonally reviewed  (right click and "Reselect all SmartList Selections" daily)  04/8: K 3.2, glucose 228, WBC 15.9, Hgb 11.8,  CSF: clear, glucose 100, RBC 82, WBC 5, total protein 39 04/07: MRI Brain>>1. No acute intracranial abnormality. 2. A few small foci of T2 hyperintensity within the white matter of the cerebral hemispheres, nonspecific but may be seen with chronic microvascular ischemic changes. 04/07: EEG>>Impression: This is an abnormal awake and drowsy EEG. Mild diffuse slowing is consistent with generalized neuronal dysfunction. No abnormal discharges or seizures are noted.  04/6: CT Head negative for acute intracranial abnormalities  Resolved Hospital Problem list   Respiratory failure now s/p extubation to RA  Assessment & Plan:   Acute encephalopathy of unclear etiology possibly secondary to drug overdose vs  CNS infection, stroke ~CT Head 04/6 negative for acute intracranial abnormalities  Intubated for airway protection s/p extubation 4/9  -WUA daily  -MRI Brain negative -EEG with nonspecific generalized slowing, NO seizures -Will start Depakote for mood stabilization. Monitor LFTs while on Depakote -Neurology consulted, appreciate input   Possible CNS infection - initial concerns for Meningitis -Trend WBC and monitor fever curve  -Follow cultures as above -Initial analysis of CSF not consistent with Bacterial Meninginitis, but awaiting cultures results -Initially on ABX as per ID - vancomycin, ceftriaxone, ampicillin, &  Acyclovir but this has bee discontinued as there is low suspicion for meningitis at this time. -Continue Decadron -ID is following, appreciate input   HTN -Continuous telemetry monitoring  -Resume outpatient aspirin, losartan, and rosuvastatin  -Prn hydralazine for bp management    Hypokalemia  -Monitor I&O's / urinary output -Follow BMP -Ensure adequate renal perfusion -Avoid nephrotoxic agents as able -Replace electrolytes as indicated -Pharmacy consulted for assistance in electrolyte replacement   Remote hx of ?Otosclerosis with auditory hallucinations -Previously referred to Otol[facility]ology at [facility] when she presented to the ED with Vertigo (4/1) and Paranoia (4/2). It appears patient has not followed with them. -Psych Consult for drug overdose. Patient may have overdosed on Flexeril per son in-law who found empty bottles. UDS + for TCA -Will start Depakote as above    Best practice (right click and "Reselect all SmartList Selections" daily)  Diet:  NPO, tube feedings Pain/Anxiety/Delirium protocol (if indicated): Yes (RASS goal -1) VAP protocol (if indicated): Yes DVT prophylaxis: LMWH GI prophylaxis: H2B Glucose control:  Yes, SSI Central venous access:  N/A Arterial line:  N/A Foley:  Yes, and it is still needed Mobility:  bed rest  PT consulted: N/A Last date of multidisciplinary goals of care discussion [03/28/2021] Code Status:  full code Disposition: ICU   Labs   CBC: Recent Labs  Lab 03/26/21 1732 03/27/21 0516 03/28/21 0550 03/29/21 0454 03/30/21 0424  WBC 8.8 15.3* 15.9* 16.6* 14.1*  NEUTROABS 5.6  --   --   --   --   HGB 12.3 12.8 11.8* 10.5* 10.7*  HCT 38.4 40.0 36.2 33.7* 33.0*  MCV 85.0 86.2 84.8 86.9 85.7  PLT 290 304 292 266 271  Basic Metabolic Panel: Recent Labs  Lab 03/26/21 1732 03/26/21 2109 03/27/21 0516 03/28/21 0550 03/28/21 2121 03/29/21 0454 03/30/21 0424  NA 135  --  141 141  --  144 145  K 3.4*  --  3.6 3.2*  3.4* 3.8 3.1*  CL 99  --  104 109  --  113* 111  CO2 23  --  21* 23  --  25 28  GLUCOSE 92  --  118* 228*  --  167* 109*  BUN 8  --  7* 12  --  21 19  CREATININE 0.97  --  0.90 0.89  --  0.85 0.81  CALCIUM 9.7  --  9.6 9.0  --  8.9 8.6*  MG  --  2.1 2.2 2.1  --  2.3 2.1  PHOS  --  4.5 3.4 2.7  --  3.6 2.2*   GFR: Estimated Creatinine Clearance: 102.8 mL/min (by C-G formula based on SCr of 0.81 mg/dL). Recent Labs  Lab 03/26/21 2219 03/27/21 0516 03/28/21 0550 03/29/21 0454 03/30/21 0424  PROCALCITON <0.10  --   --   --   --   WBC  --  15.3* 15.9* 16.6* 14.1*    Liver Function Tests: Recent Labs  Lab 03/26/21 1732 03/28/21 0550 03/29/21 0454  AST 28  --   --   ALT 22  --   --   ALKPHOS 71  --   --   BILITOT 1.1  --   --   PROT 8.2*  --   --   ALBUMIN 4.0 3.2* 3.0*   No results for input(s): LIPASE, AMYLASE in the last 168 hours. Recent Labs  Lab 03/26/21 1732 03/26/21 2109  AMMONIA 10 17    ABG    Component Value Date/Time   PHART 7.37 03/27/2021 0447   PCO2ART 37 03/27/2021 0447   PO2ART 109 (H) 03/27/2021 0447   HCO3 21.4 03/27/2021 0447   ACIDBASEDEF 3.4 (H) 03/27/2021 0447   O2SAT 98.1 03/27/2021 0447     Coagulation Profile: No results for input(s): INR, PROTIME in the last 168 hours.  Cardiac Enzymes: Recent Labs  Lab 03/26/21 2109  CKTOTAL 416*    HbA1C: Hgb A1c MFr Bld  Date/Time Value Ref Range Status  03/29/2021 04:54 AM 6.4 (H) 4.8 - 5.6 % Final    Comment:    (NOTE) Pre diabetes:          5.7%-6.4%  Diabetes:              >6.4%  Glycemic control for   <7.0% adults with diabetes     CBG: Recent Labs  Lab 03/29/21 1533 03/29/21 1947 03/29/21 2359 03/30/21 0345 03/30/21 0744  GLUCAP 100* 111* 107* 99 113*    Review of Systems:   Unable to assess pt mechanically intubated   Past Medical History:  She,  has a past medical history of Anxiety (03/12/2013), Degenerative disc disease, lumbar, Diabetes mellitus without  complication ([place]), Endometriosis, Frequent PVCs, Hypertension, Otalgia, unspecified, Palpitations (10/04/2010), and Vascular bruit (01/2013).   Surgical History:   Past Surgical History:  Procedure Laterality Date  . BREAST SURGERY     benign tumor excised-age 9  . CARDIOVASCULAR STRESS TEST  03/13/2008   No scintigraphic evidence of inducible myocardial ischemia, EKG negative for ischemia, no ECG changes.  . COLONOSCOPY  2009  . COLONOSCOPY N/A 09/21/2019   Procedure: COLONOSCOPY;  Surgeon: Rogene Houston, MD;  Location: [facility];  Service: Endoscopy;  Laterality:  N/A;  2:00-10:30  . CYST EXCISION  2007   Scalp  . DOPPLER ECHOCARDIOGRAPHY  03/13/2008   EF >13%, LV systolic function is normal, LV wall function is normal  . LAPAROSCOPIC CHOLECYSTECTOMY  2010  . LUMBAR LAMINECTOMY  2009    L4-L5 and L5-S1 laminectomy with diskectomy; transforaminal  . POLYPECTOMY  09/21/2019   Procedure: POLYPECTOMY;  Surgeon: Rogene Houston, MD;  Location: [facility];  Service: Endoscopy;;  . REPLACEMENT TOTAL KNEE    . SLEEP STUDY  10/29/2009   AHI-1.8/hr, during REM 6.4/hr; RDI-8.0/hr, during REM 15.0/hr; avg oxygen during REM and NREM was 94%  . TOTAL KNEE ARTHROPLASTY Bilateral   . TUBAL LIGATION       Social History:   reports that she quit smoking about 24 years ago. Her smoking use included cigarettes. She started smoking about 48 years ago. She has a 40.00 pack-year smoking history. She quit smokeless tobacco use about 26 years ago. She reports that she does not drink alcohol and does not use drugs.   Family History:  Her family history includes Diabetes in her brother and mother; Hypertension in her brother, father, and mother.   Allergies Allergies  Allergen Reactions  . Metoprolol Other (See Comments)    Fatigue and malaise; wheezing  . Sulfa Antibiotics      Home Medications  Prior to Admission medications   Medication Sig Start Date End Date Taking? Authorizing Provider   aspirin 81 MG tablet Take 81 mg by mouth daily.    [provider]  cholecalciferol (VITAMIN D3) 25 MCG (1000 UT) tablet Take 1,000 Units by mouth daily.    [provider]  dexlansoprazole (DEXILANT) 60 MG capsule Take 60 mg by mouth daily.    [provider]  diltiazem (CARDIZEM) 30 MG tablet TAKE 1 TABLET (30 MG TOTAL) BY MOUTH 2 (TWO) TIMES DAILY AS NEEDED (PALPITATIONS). 10/03/20   Arnoldo Lenis, MD  escitalopram (LEXAPRO) 10 MG tablet Take 10 mg by mouth daily.    [provider]  famotidine (PEPCID) 20 MG tablet Take 20 mg by mouth at bedtime. 01/07/20   [provider]  ferrous sulfate 325 (65 FE) MG tablet Take 325 mg by mouth daily with breakfast.     [provider]  fluticasone (FLONASE) 50 MCG/ACT nasal spray Place 2 sprays into both nostrils daily as needed for allergies.  07/24/19   [provider]  furosemide (LASIX) 40 MG tablet Take 40 mg by mouth every other day.     [provider]  HYDROcodone-acetaminophen (NORCO/VICODIN) 5-325 MG per tablet Take 1 tablet by mouth every 4 (four) hours as needed for moderate pain or severe pain.  11/23/14   [provider]  levocetirizine (XYZAL) 5 MG tablet Take 1 tablet by mouth every evening.  07/24/19   [provider]  losartan (COZAAR) 100 MG tablet Take 100 mg by mouth daily.    [provider]  meclizine (ANTIVERT) 25 MG tablet Take 1 tablet (25 mg total) by mouth 3 (three) times daily as needed for dizziness. 03/21/21   Isla Pence, MD  megestrol (MEGACE) 20 MG tablet TAKE 1 TABLET BY MOUTH EVERY DAY 12/29/18   Florian Buff, MD  meloxicam (MOBIC) 15 MG tablet Take 15 mg by mouth daily as needed. 02/05/20   [provider]  polyethylene glycol powder (GLYCOLAX/MIRALAX) powder 1 scoop daily or as needed 06/07/18   Florian Buff, MD  PREMARIN vaginal cream PLACE  1 APPLICATORFUL VAGINALLY DAILY. 12/30/20   Florian Buff, MD   risperiDONE (RISPERDAL) 0.5 MG tablet Take 1 tablet (0.5 mg total) by mouth 2 (two) times daily. 03/24/21   Clapacs, Madie Reno, MD  rosuvastatin (CRESTOR) 5 MG tablet Take 5 mg by mouth daily. 09/28/20   [provider]  verapamil (VERELAN PM) 240 MG 24 hr capsule TAKE 1 CAPSULE BY MOUTH TWICE A DAY 12/02/18   Arnoldo Lenis, MD  vitamin C (ASCORBIC ACID) 500 MG tablet Take 500 mg by mouth daily.    [provider]     Critical care time: 40 minutes         Rufina Falco, DNP, CCRN, FNP-C, AGACNP-BC Acute Care Nurse Practitioner  [facility] Pager: [phone] [facility]
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
## 2021-03-30 NOTE — Progress Notes (Addendum)
Subjective: The patient was extubated yesterday. She is now awake and conversant. Son states that several pill bottles were in disarray when her home was inspected. Patient thinks that she may have taken too many Flexeril pills at home. Son states that she had been sleep-deprived for about 3 days prior to being found unresponsive at home.   Objective: Current vital signs: BP (!) 161/80 (BP Location: Right Arm)   Pulse 95   Temp 99.3 F (37.4 C) (Axillary)   Resp 18   Ht 5' 5.98" (1.676 m)   Wt (!) 140.1 kg   SpO2 96%   BMI 49.88 kg/m  Vital signs in last 24 hours: Temp:  [98.9 F (37.2 C)-100.2 F (37.9 C)] 99.3 F (37.4 C) (04/10 0800) Pulse Rate:  [92-121] 95 (04/10 0900) Resp:  [16-25] 18 (04/10 0900) BP: (129-172)/(55-111) 161/80 (04/10 0800) SpO2:  [92 %-97 %] 96 % (04/10 0900) Weight:  [140.1 kg] 140.1 kg (04/10 0429)  Intake/Output from previous day: 04/09 0701 - 04/10 0700 In: 2251.9 [I.V.:1788.2; NG/GT:408.7; IV Piggyback:55.1] Out: 2640 [Urine:2640] Intake/Output this shift: Total I/O In: -  Out: 1200 [Urine:1200] Nutritional status:  Diet Order            Diet heart healthy/carb modified Room service appropriate? Yes; Fluid consistency: Thin  Diet effective now                General: Morbid obesity HEENT: [place]/AT Lungs: Respirations unlabored Ext: No edema  Neurologic Exam: Ment: Awake, with mildly decreased level of alertness. Speech fluent with intact comprehension and naming. States "Thursday" when asked the day of the week. Knows the month and the year. [facility]. Knows the state but not the city she is in. States "pills" when asked why she is in the hospital. Also states "I'll never do that again".  CN: PERRL. EOMI. Face symmetric. Phonation intact.  Motor: 4+/5 bilateral upper extremities. Does not want to have testing of BLE against resistance, but can flutter-kick with both legs without asymmetry.  Sensory: Intact to  FT x 4.  Reflexes: 2+ bilateral brachioradialis, 1+ bilateral patellae.    Lab Results: Results for orders placed or performed during the hospital encounter of 03/26/21 (from the past 48 hour(s))  Glucose, capillary     Status: Abnormal   Collection Time: 03/28/21  4:12 PM  Result Value Ref Range   Glucose-Capillary 184 (H) 70 - 99 mg/dL    Comment: Glucose reference range applies only to samples taken after fasting for at least 8 hours.  Glucose, capillary     Status: Abnormal   Collection Time: 03/28/21  7:24 PM  Result Value Ref Range   Glucose-Capillary 205 (H) 70 - 99 mg/dL    Comment: Glucose reference range applies only to samples taken after fasting for at least 8 hours.  Potassium     Status: Abnormal   Collection Time: 03/28/21  9:21 PM  Result Value Ref Range   Potassium 3.4 (L) 3.5 - 5.1 mmol/L    Comment: Performed at [facility], [address] [place], [place] [address]  Glucose, capillary     Status: Abnormal   Collection Time: 03/28/21 11:35 PM  Result Value Ref Range   Glucose-Capillary 166 (H) 70 - 99 mg/dL    Comment: Glucose reference range applies only to samples taken after fasting for at least 8 hours.  Glucose, capillary     Status: Abnormal   Collection Time: 03/29/21  4:00  AM  Result Value Ref Range   Glucose-Capillary 161 (H) 70 - 99 mg/dL    Comment: Glucose reference range applies only to samples taken after fasting for at least 8 hours.  Hemoglobin A1c     Status: Abnormal   Collection Time: 03/29/21  4:54 AM  Result Value Ref Range   Hgb A1c MFr Bld 6.4 (H) 4.8 - 5.6 %    Comment: (NOTE) Pre diabetes:          5.7%-6.4%  Diabetes:              >6.4%  Glycemic control for   <7.0% adults with diabetes    Mean Plasma Glucose 136.98 mg/dL    Comment: Performed at [facility] [address], [place], [place] [address]  Renal function panel     Status: Abnormal   Collection Time: 03/29/21  4:54 AM  Result Value Ref Range    Sodium 144 135 - 145 mmol/L   Potassium 3.8 3.5 - 5.1 mmol/L   Chloride 113 (H) 98 - 111 mmol/L   CO2 25 22 - 32 mmol/L   Glucose, Bld 167 (H) 70 - 99 mg/dL    Comment: Glucose reference range applies only to samples taken after fasting for at least 8 hours.   BUN 21 8 - 23 mg/dL   Creatinine, Ser 0.85 0.44 - 1.00 mg/dL   Calcium 8.9 8.9 - 10.3 mg/dL   Phosphorus 3.6 2.5 - 4.6 mg/dL   Albumin 3.0 (L) 3.5 - 5.0 g/dL   GFR, Estimated >60 >60 mL/min    Comment: (NOTE) Calculated using the CKD-EPI Creatinine Equation (2021)    Anion gap 6 5 - 15    Comment: Performed at [facility], [address] [place], [place] [address]  Magnesium     Status: None   Collection Time: 03/29/21  4:54 AM  Result Value Ref Range   Magnesium 2.3 1.7 - 2.4 mg/dL    Comment: Performed at [facility], [address] [place], [place] [address]  CBC     Status: Abnormal   Collection Time: 03/29/21  4:54 AM  Result Value Ref Range   WBC 16.6 (H) 4.0 - 10.5 K/uL   RBC 3.88 3.87 - 5.11 MIL/uL   Hemoglobin 10.5 (L) 12.0 - 15.0 g/dL   HCT 33.7 (L) 36.0 - 46.0 %   MCV 86.9 80.0 - 100.0 fL   MCH 27.1 26.0 - 34.0 pg   MCHC 31.2 30.0 - 36.0 g/dL   RDW 14.5 11.5 - 15.5 %   Platelets 266 150 - 400 K/uL   nRBC 0.0 0.0 - 0.2 %    Comment: Performed at [facility], [address] [place], [place] [address]  Glucose, capillary     Status: Abnormal   Collection Time: 03/29/21  7:41 AM  Result Value Ref Range   Glucose-Capillary 159 (H) 70 - 99 mg/dL    Comment: Glucose reference range applies only to samples taken after fasting for at least 8 hours.  Glucose, capillary     Status: Abnormal   Collection Time: 03/29/21 11:37 AM  Result Value Ref Range   Glucose-Capillary 144 (H) 70 - 99 mg/dL    Comment: Glucose reference range applies only to samples taken after fasting for at least 8 hours.  Glucose, capillary     Status: Abnormal   Collection Time: 03/29/21  3:33 PM  Result  Value Ref Range   Glucose-Capillary 100 (H)  70 - 99 mg/dL    Comment: Glucose reference range applies only to samples taken after fasting for at least 8 hours.  Glucose, capillary     Status: Abnormal   Collection Time: 03/29/21  7:47 PM  Result Value Ref Range   Glucose-Capillary 111 (H) 70 - 99 mg/dL    Comment: Glucose reference range applies only to samples taken after fasting for at least 8 hours.  Glucose, capillary     Status: Abnormal   Collection Time: 03/29/21 11:59 PM  Result Value Ref Range   Glucose-Capillary 107 (H) 70 - 99 mg/dL    Comment: Glucose reference range applies only to samples taken after fasting for at least 8 hours.  Glucose, capillary     Status: None   Collection Time: 03/30/21  3:45 AM  Result Value Ref Range   Glucose-Capillary 99 70 - 99 mg/dL    Comment: Glucose reference range applies only to samples taken after fasting for at least 8 hours.  Basic metabolic panel     Status: Abnormal   Collection Time: 03/30/21  4:24 AM  Result Value Ref Range   Sodium 145 135 - 145 mmol/L   Potassium 3.1 (L) 3.5 - 5.1 mmol/L   Chloride 111 98 - 111 mmol/L   CO2 28 22 - 32 mmol/L   Glucose, Bld 109 (H) 70 - 99 mg/dL    Comment: Glucose reference range applies only to samples taken after fasting for at least 8 hours.   BUN 19 8 - 23 mg/dL   Creatinine, Ser 0.81 0.44 - 1.00 mg/dL   Calcium 8.6 (L) 8.9 - 10.3 mg/dL   GFR, Estimated >60 >60 mL/min    Comment: (NOTE) Calculated using the CKD-EPI Creatinine Equation (2021)    Anion gap 6 5 - 15    Comment: Performed at [facility], [address] [place], [place] [address]  CBC     Status: Abnormal   Collection Time: 03/30/21  4:24 AM  Result Value Ref Range   WBC 14.1 (H) 4.0 - 10.5 K/uL   RBC 3.85 (L) 3.87 - 5.11 MIL/uL   Hemoglobin 10.7 (L) 12.0 - 15.0 g/dL   HCT 33.0 (L) 36.0 - 46.0 %   MCV 85.7 80.0 - 100.0 fL   MCH 27.8 26.0 - 34.0 pg   MCHC 32.4 30.0 - 36.0 g/dL   RDW 14.6 11.5 - 15.5 %    Platelets 271 150 - 400 K/uL   nRBC 0.0 0.0 - 0.2 %    Comment: Performed at [facility], [address] [place], [place] [address]  Magnesium     Status: None   Collection Time: 03/30/21  4:24 AM  Result Value Ref Range   Magnesium 2.1 1.7 - 2.4 mg/dL    Comment: Performed at [facility], [address], [place], [place] [address]  Phosphorus     Status: Abnormal   Collection Time: 03/30/21  4:24 AM  Result Value Ref Range   Phosphorus 2.2 (L) 2.5 - 4.6 mg/dL    Comment: Performed at [facility], [address] [place], [place] [address]  Glucose, capillary     Status: Abnormal   Collection Time: 03/30/21  7:44 AM  Result Value Ref Range   Glucose-Capillary 113 (H) 70 - 99 mg/dL    Comment: Glucose reference range applies only to samples taken after fasting for at least 8 hours.  Glucose, capillary     Status: Abnormal  Collection Time: 03/30/21 11:25 AM  Result Value Ref Range   Glucose-Capillary 130 (H) 70 - 99 mg/dL    Comment: Glucose reference range applies only to samples taken after fasting for at least 8 hours.    Recent Results (from the past 240 hour(s))  Resp Panel by RT-PCR (Flu A&B, Covid) Nasopharyngeal Swab     Status: None   Collection Time: 03/23/21 12:42 AM   Specimen: Nasopharyngeal Swab; Nasopharyngeal(NP) swabs in vial transport medium  Result Value Ref Range Status   SARS Coronavirus 2 by RT PCR NEGATIVE NEGATIVE Final    Comment: (NOTE) SARS-CoV-2 target nucleic acids are NOT DETECTED.  The SARS-CoV-2 RNA is generally detectable in upper respiratory specimens during the acute phase of infection. The lowest concentration of SARS-CoV-2 viral copies this assay can detect is 138 copies/mL. A negative result does not preclude SARS-Cov-2 infection and should not be used as the sole basis for treatment or other patient management decisions. A negative result may occur with  improper specimen collection/handling, submission  of specimen other than nasopharyngeal swab, presence of viral mutation(s) within the areas targeted by this assay, and inadequate number of viral copies(<138 copies/mL). A negative result must be combined with clinical observations, patient history, and epidemiological information. The expected result is Negative.  Fact Sheet for Patients:  EntrepreneurPulse.com.au  Fact Sheet for Healthcare Providers:  IncredibleEmployment.be  This test is no t yet approved or cleared by the [place] FDA and  has been authorized for detection and/or diagnosis of SARS-CoV-2 by FDA under an Emergency Use Authorization (EUA). This EUA will remain  in effect (meaning this test can be used) for the duration of the COVID-19 declaration under Section 564(b)(1) of the Act, 21 U.S.C.section 360bbb-3(b)(1), unless the authorization is terminated  or revoked sooner.       Influenza A by PCR NEGATIVE NEGATIVE Final   Influenza B by PCR NEGATIVE NEGATIVE Final    Comment: (NOTE) The Xpert Xpress SARS-CoV-2/FLU/RSV plus assay is intended as an aid in the diagnosis of influenza from Nasopharyngeal swab specimens and should not be used as a sole basis for treatment. Nasal washings and aspirates are unacceptable for Xpert Xpress SARS-CoV-2/FLU/RSV testing.  Fact Sheet for Patients: EntrepreneurPulse.com.au  Fact Sheet for Healthcare Providers: IncredibleEmployment.be  This test is not yet approved or cleared by the [place] FDA and has been authorized for detection and/or diagnosis of SARS-CoV-2 by FDA under an Emergency Use Authorization (EUA). This EUA will remain in effect (meaning this test can be used) for the duration of the COVID-19 declaration under Section 564(b)(1) of the Act, 21 U.S.C. section 360bbb-3(b)(1), unless the authorization is terminated or revoked.  Performed at [facility], [address] [place], [place] [address]   Resp Panel by RT-PCR (Flu A&B, Covid) Nasopharyngeal Swab     Status: None   Collection Time: 03/26/21  5:26 PM   Specimen: Nasopharyngeal Swab; Nasopharyngeal(NP) swabs in vial transport medium  Result Value Ref Range Status   SARS Coronavirus 2 by RT PCR NEGATIVE NEGATIVE Final    Comment: (NOTE) SARS-CoV-2 target nucleic acids are NOT DETECTED.  The SARS-CoV-2 RNA is generally detectable in upper respiratory specimens during the acute phase of infection. The lowest concentration of SARS-CoV-2 viral copies this assay can detect is 138 copies/mL. A negative result does not preclude SARS-Cov-2 infection and should not be used as the sole basis for treatment or other patient management decisions. A negative result may occur with  improper  specimen collection/handling, submission of specimen other than nasopharyngeal swab, presence of viral mutation(s) within the areas targeted by this assay, and inadequate number of viral copies(<138 copies/mL). A negative result must be combined with clinical observations, patient history, and epidemiological information. The expected result is Negative.  Fact Sheet for Patients:  EntrepreneurPulse.com.au  Fact Sheet for Healthcare Providers:  IncredibleEmployment.be  This test is no t yet approved or cleared by the [place] FDA and  has been authorized for detection and/or diagnosis of SARS-CoV-2 by FDA under an Emergency Use Authorization (EUA). This EUA will remain  in effect (meaning this test can be used) for the duration of the COVID-19 declaration under Section 564(b)(1) of the Act, 21 U.S.C.section 360bbb-3(b)(1), unless the authorization is terminated  or revoked sooner.       Influenza A by PCR NEGATIVE NEGATIVE Final   Influenza B by PCR NEGATIVE NEGATIVE Final    Comment: (NOTE) The Xpert Xpress SARS-CoV-2/FLU/RSV plus assay is intended as an aid in the  diagnosis of influenza from Nasopharyngeal swab specimens and should not be used as a sole basis for treatment. Nasal washings and aspirates are unacceptable for Xpert Xpress SARS-CoV-2/FLU/RSV testing.  Fact Sheet for Patients: EntrepreneurPulse.com.au  Fact Sheet for Healthcare Providers: IncredibleEmployment.be  This test is not yet approved or cleared by the [place] FDA and has been authorized for detection and/or diagnosis of SARS-CoV-2 by FDA under an Emergency Use Authorization (EUA). This EUA will remain in effect (meaning this test can be used) for the duration of the COVID-19 declaration under Section 564(b)(1) of the Act, 21 U.S.C. section 360bbb-3(b)(1), unless the authorization is terminated or revoked.  Performed at [facility], [address] [place], [place] [address]   Blood culture (routine x 2)     Status: None (Preliminary result)   Collection Time: 03/26/21  9:09 PM   Specimen: BLOOD  Result Value Ref Range Status   Specimen Description BLOOD RIGHT ANTECUBITAL  Final   Special Requests   Final    BOTTLES DRAWN AEROBIC AND ANAEROBIC Blood Culture adequate volume   Culture   Final    NO GROWTH 4 DAYS Performed at [facility], [address], [place], [place] [address]    Report Status PENDING  Incomplete  Blood culture (routine x 2)     Status: None (Preliminary result)   Collection Time: 03/26/21 10:18 PM   Specimen: BLOOD  Result Value Ref Range Status   Specimen Description BLOOD BLOOD RIGHT HAND  Final   Special Requests   Final    BOTTLES DRAWN AEROBIC AND ANAEROBIC Blood Culture results may not be optimal due to an inadequate volume of blood received in culture bottles   Culture   Final    NO GROWTH 4 DAYS Performed at [facility], [address], [place], [place] [address]    Report Status PENDING  Incomplete  MRSA PCR Screening     Status: None   Collection Time:  03/26/21 10:56 PM   Specimen: Nasal Mucosa; Nasopharyngeal  Result Value Ref Range Status   MRSA by PCR NEGATIVE NEGATIVE Final    Comment:        The GeneXpert MRSA Assay (FDA approved for NASAL specimens only), is one component of a comprehensive MRSA colonization surveillance program. It is not intended to diagnose MRSA infection nor to guide or monitor treatment for MRSA infections. Performed at [facility], [address], [place], [place] [address]   Culture, fungus  without smear     Status: None (Preliminary result)   Collection Time: 03/27/21  2:43 PM   Specimen: CSF; Cerebrospinal Fluid  Result Value Ref Range Status   Specimen Description   Final    CSF Performed at [facility], [address] [place], [place] [address]    Special Requests   Final    Immunocompromised Performed at [facility], [address] [place], [place] [address]    Culture   Final    NO FUNGUS ISOLATED AFTER 2 DAYS Performed at [facility], [place] [address], [place], [place] [address]    Report Status PENDING  Incomplete  CSF culture w Gram Stain     Status: None   Collection Time: 03/27/21  2:43 PM   Specimen: PATH Cytology CSF; Cerebrospinal Fluid  Result Value Ref Range Status   Specimen Description   Final    CSF Performed at [facility], [address], [place], [place] [address]    Special Requests   Final    NONE Performed at [facility], [address] [place], [place] [address]    Gram Stain   Final    WBC SEEN RBC SEEN NO ORGANISMS SEEN Performed at [facility], [address] [place], [place] [address]    Culture   Final    NO GROWTH 3 DAYS Performed at [facility], [place] [address], [place], [place] [address]    Report Status 03/30/2021 FINAL  Final  Anaerobic culture w Gram Stain     Status: None (Preliminary result)   Collection Time: 03/27/21  2:43 PM   Specimen: PATH Cytology CSF;  Cerebrospinal Fluid  Result Value Ref Range Status   Specimen Description   Final    CSF Performed at [facility], [address], [place], [place] [address]    Special Requests   Final    NONE Performed at [facility], [address] [place],  [address]    Gram Stain   Final    NO WBC SEEN NO ORGANISMS SEEN Performed at [facility], [place] [address], [place],  [address]    Culture   Final    NO ANAEROBES ISOLATED; CULTURE IN PROGRESS FOR 5 DAYS   Report Status PENDING  Incomplete    Lipid Panel No results for input(s): CHOL, TRIG, HDL, CHOLHDL, VLDL, LDLCALC in the last 72 hours.  Studies/Results: No results found.  Medications:  Scheduled: . vitamin C  500 mg Oral Daily  . aspirin  81 mg Oral Daily  . chlorhexidine  15 mL Mouth Rinse BID  . Chlorhexidine Gluconate Cloth  6 each Topical Daily  . cholecalciferol  1,000 Units Per Tube Daily  . divalproex  250 mg Oral Q12H  . enoxaparin (LOVENOX) injection  0.5 mg/kg Subcutaneous Q24H  . ferrous sulfate  325 mg Oral Q breakfast  . insulin aspart  0-15 Units Subcutaneous Q4H  . lidocaine (PF)  5 mL Other Once  . losartan  100 mg Oral Daily  . mouth rinse  15 mL Mouth Rinse q12n4p  . multivitamin  15 mL Oral Daily  . phosphorus  500 mg Oral Q4H  . potassium chloride  40 mEq Oral BID  . risperiDONE  2 mg Oral QHS  . rosuvastatin  5 mg Oral Daily   Continuous: . famotidine (PEPCID) IV 20 mg (03/29/21 2207)  . lactated ringers 75 mL/hr at 03/30/21 0600  .  potassium chloride 10 mEq (03/30/21 1400)    Assessment: 65 year old female presenting with severe encephalopathy in the context of severe hypoxic respiratory failure requiring intubation. Underlying etiology most likely secondary to medication overdose at home; suspect Flexeril.  1. Neurological exam significantly improved today. Now conversant and oriented . 2. MRI brain on 4/7 revealed no acute intracranial abnormality. A  few nonspecific small foci of T2 hyperintensity within the white matter of the cerebral hemispheres most likely represent chronic microvascular ischemic changes (images personally reviewed). 3. Seizure felt to be unlikely as the underlying etiology, but will need EEG to further assess.  4. Progressive worsening of auditory hallucinations in the context of gradually worsening hearing loss. The hallucinations are of voices and music. Syndromic DDx includes Auditory Sherran Needs syndrome, for which potential underlying etiologies include schizophrenia and temporal lobe epilepsy.   Recommendations: 1. EEG on Monday 2. OOB to chair when possible 3. Psychiatry is consulting. 4. ENT consult as an outpatient for progressive hearing loss, if she is not already being followed by ENT for this.    LOS: 4 days   @Electronically  signed: Dr. Kerney Elbe 03/30/2021  2:31 PM

## 2021-03-31 ENCOUNTER — Other Ambulatory Visit: Payer: Medicare HMO

## 2021-03-31 DIAGNOSIS — F29 Unspecified psychosis not due to a substance or known physiological condition: Secondary | ICD-10-CM | POA: Diagnosis not present

## 2021-03-31 LAB — CULTURE, BLOOD (ROUTINE X 2)
Culture: NO GROWTH
Culture: NO GROWTH
Special Requests: ADEQUATE

## 2021-03-31 LAB — GLUCOSE, CAPILLARY
Glucose-Capillary: 93 mg/dL (ref 70–99)
Glucose-Capillary: 95 mg/dL (ref 70–99)
Glucose-Capillary: 98 mg/dL (ref 70–99)
Glucose-Capillary: 115 mg/dL — ABNORMAL HIGH (ref 70–99)
Glucose-Capillary: 99 mg/dL (ref 70–99)
Glucose-Capillary: 106 mg/dL — ABNORMAL HIGH (ref 70–99)

## 2021-03-31 LAB — CBC
HCT: 35.4 % — ABNORMAL LOW (ref 36.0–46.0)
Hemoglobin: 11.4 g/dL — ABNORMAL LOW (ref 12.0–15.0)
MCH: 27.4 pg (ref 26.0–34.0)
MCHC: 32.2 g/dL (ref 30.0–36.0)
MCV: 85.1 fL (ref 80.0–100.0)
Platelets: 233 10*3/uL (ref 150–400)
RBC: 4.16 MIL/uL (ref 3.87–5.11)
RDW: 14.5 % (ref 11.5–15.5)
WBC: 21.5 10*3/uL — ABNORMAL HIGH (ref 4.0–10.5)
nRBC: 0.1 % (ref 0.0–0.2)

## 2021-03-31 LAB — BASIC METABOLIC PANEL
Anion gap: 9 (ref 5–15)
BUN: 15 mg/dL (ref 8–23)
CO2: 26 mmol/L (ref 22–32)
Calcium: 8.4 mg/dL — ABNORMAL LOW (ref 8.9–10.3)
Chloride: 107 mmol/L (ref 98–111)
Creatinine, Ser: 0.85 mg/dL (ref 0.44–1.00)
GFR, Estimated: >60 mL/min (ref >60)
Glucose, Bld: 99 mg/dL (ref 70–99)
Potassium: 3 mmol/L — ABNORMAL LOW (ref 3.5–5.1)
Sodium: 142 mmol/L (ref 135–145)

## 2021-03-31 LAB — PHOSPHORUS: Phosphorus: 2.6 mg/dL (ref 2.5–4.6)

## 2021-03-31 LAB — MAGNESIUM: Magnesium: 2 mg/dL (ref 1.7–2.4)

## 2021-03-31 LAB — OLIGOCLONAL BANDS, CSF + SERM

## 2021-03-31 MED ORDER — ENSURE ENLIVE PO LIQD
237.0000 mL | Freq: Three times a day (TID) | ORAL | Status: DC
  Administered 2021-03-31 – 2021-04-10 (×20): 237 mL via ORAL

## 2021-03-31 MED ORDER — FERROUS SULFATE 325 (65 FE) MG PO TABS
325.0000 mg | ORAL_TABLET | Freq: Every day | ORAL | Status: DC
  Administered 2021-04-01: 325 mg via ORAL
  Filled 2021-03-31: qty 1

## 2021-03-31 MED ORDER — HYDROCORTISONE NA SUCCINATE PF 100 MG IJ SOLR: 50.0000 mg | Freq: Four times a day (QID) | INTRAMUSCULAR | Status: DC

## 2021-03-31 MED ORDER — HYDROCODONE-ACETAMINOPHEN 7.5-325 MG PO TABS
1.0000 | ORAL_TABLET | Freq: Four times a day (QID) | ORAL | Status: DC | PRN
  Administered 2021-03-31: 1 via ORAL
  Filled 2021-03-31: qty 1

## 2021-03-31 MED ORDER — FAMOTIDINE 20 MG PO TABS
20.0000 mg | ORAL_TABLET | Freq: Every day | ORAL | Status: DC
  Administered 2021-03-31 – 2021-04-09 (×10): 20 mg via ORAL
  Filled 2021-03-31 (×10): qty 1

## 2021-03-31 MED ORDER — POTASSIUM CHLORIDE CRYS ER 20 MEQ PO TBCR
20.0000 meq | EXTENDED_RELEASE_TABLET | ORAL | Status: AC
  Administered 2021-03-31 (×2): 20 meq via ORAL
  Filled 2021-03-31 (×2): qty 1

## 2021-03-31 MED ORDER — ADULT MULTIVITAMIN W/MINERALS CH
1.0000 | ORAL_TABLET | Freq: Every day | ORAL | Status: DC
  Administered 2021-04-01: 1 via ORAL
  Filled 2021-03-31: qty 1

## 2021-03-31 MED ORDER — POTASSIUM CHLORIDE 10 MEQ/100ML IV SOLN
10.0000 meq | INTRAVENOUS | Status: AC
  Administered 2021-03-31 (×3): 10 meq via INTRAVENOUS
  Filled 2021-03-31 (×4): qty 100

## 2021-03-31 MED ORDER — VITAMIN D 25 MCG (1000 UNIT) PO TABS
1000.0000 [IU] | ORAL_TABLET | Freq: Every day | ORAL | Status: DC
  Administered 2021-04-01: 1000 [IU] via ORAL
  Filled 2021-03-31: qty 1

## 2021-03-31 MED ORDER — LACTATED RINGERS IV BOLUS
1000.0000 mL | Freq: Once | INTRAVENOUS | Status: AC
  Administered 2021-03-31: 1000 mL via INTRAVENOUS

## 2021-03-31 MED ORDER — POTASSIUM CHLORIDE 10 MEQ/100ML IV SOLN
10.0000 meq | INTRAVENOUS | Status: AC
  Filled 2021-03-31 (×4): qty 100

## 2021-03-31 NOTE — Progress Notes (Signed)
PROGRESS NOTE    Megan Oneal  WCH:[id number] DOB: [birth year] DOA: 03/26/2021 PCP: Sharilyn Sites, MD   Brief Narrative: 65 yo female who recently presented to [facility] ER on 04/4 with auditory hallucinations that started a couple of months prior to that ER presentation.  At that time she was evaluated by psychiatry and denied suicidal or homicidal ideations  She did not require hospital admission but was instructed to start Risperdal 0.5 mg bid and to follow-up with her PCP and Otolaryngologist. She presented again to [facility] ER on 04/6 via EMS with unresponsiveness with reported last well known time at 06:30 am on 04/6.  Family also reported finding a flexiril bottle near the pt. She was admitted with Acute Metabolic Encephalopathy in setting of suspected CNS infection possible Meninginitis vs. Drug overdose (? Flexeril), along with Acute Hypoxic Respiratory Failure requiring intubation.  Patient was successfully extubated on 4/9.  Transfer to [facility] hospitalist service.  Remains hemodynamically stable.  Suspected Flexeril overdose as cause.  Repeat EEG ordered and pending.  Case discussed with neurology.  They will sign off if EEG negative.  Appreciate psychiatry recommendations.   Assessment & Plan:   Active Problems:   Acute encephalopathy  Acute toxic encephalopathy secondary to suspected drug overdose No clinical evidence of CNS infection Discussed with neurology, no intervention from their standpoint Plan: Follow psychiatry recommendations Depakote and Risperdal No further recommendations from neurology, input appreciated No antibiotics  Acute hypoxic respiratory failure Status post extubation 4/9 On room air  Hypertension CAD Resume home aspirin Resume home losartan and rosuvastatin As needed hydralazine  Auditory hallucinations Remote history of otosclerosis Suspected overdose on Flexeril UDS positive for TCA Plan: Continue Depakote and Restoril Appreciate psychiatry  follow-up  Morbid obesity BMI 35.3 This complicates overall care and prognosis   DVT prophylaxis: SQ Lovenox Code Status: Full Family Communication: None today Disposition Plan: Status is: Inpatient  Remains inpatient appropriate because:Inpatient level of care appropriate due to severity of illness   Dispo: The patient is from: Home              Anticipated d/c is to: Home              Patient currently is not medically stable to d/c.   Difficult to place patient No  Transferring out of ICU today.  Will request therapy evaluations.  Possible discharge in 24 to 48 hours.     Level of care: Med-Surg  Consultants:   Neurology  Psychiatry   Procedures:None  Antimicrobials:   None   Subjective: Patient seen and examined.  History limited by lethargy but hemodynamically stable.  Continues to endorse hallucinations but improvement prior.  Objective: Vitals:   03/31/21 1223 03/31/21 1300 03/31/21 1400 03/31/21 1500  BP: (!) 103/43 (!) 121/56 (!) 130/57 (!) 109/55  Pulse: (!) 56 (!) 113 (!) 114 (!) 109  Resp: (!) 25 (!) 23 16 (!) 25  Temp:      TempSrc:      SpO2: 96% 94% 96% 97%  Weight:      Height:        Intake/Output Summary (Last 24 hours) at 03/31/2021 1624 Last data filed at 03/31/2021 1405 Gross per 24 hour  Intake 2286.96 ml  Output 3000 ml  Net -713.04 ml   Filed Weights   03/28/21 0439 03/30/21 0429 03/31/21 0417  Weight: (!) 144.9 kg (!) 140.1 kg (!) 140.3 kg    Examination:  General exam: No acute distress Respiratory system: Bibasilar crackles.  Normal work of breathing.  Room air Cardiovascular system: S1 & S2 heard, RRR. No JVD, murmurs, rubs, gallops or clicks. No pedal edema. Gastrointestinal system: Obese, nontender, nondistended, normal bowel sounds Central nervous system: Lethargic, oriented x2, no focal deficits extremities: Symmetric 5 x 5 power. Skin: No rashes, lesions or ulcers Psychiatry: Judgement and insight appear  impaired. Mood & affect flattened.     Data Reviewed: I have personally reviewed following labs and imaging studies  CBC: Recent Labs  Lab 03/26/21 1732 03/27/21 0516 03/28/21 0550 03/29/21 0454 03/30/21 0424 03/31/21 0457  WBC 8.8 15.3* 15.9* 16.6* 14.1* 21.5*  NEUTROABS 5.6  --   --   --   --   --   HGB 12.3 12.8 11.8* 10.5* 10.7* 11.4*  HCT 38.4 40.0 36.2 33.7* 33.0* 35.4*  MCV 85.0 86.2 84.8 86.9 85.7 85.1  PLT 290 304 292 266 271 518   Basic Metabolic Panel: Recent Labs  Lab 03/27/21 0516 03/28/21 0550 03/28/21 2121 03/29/21 0454 03/30/21 0424 03/31/21 0457  NA 141 141  --  144 145 142  K 3.6 3.2* 3.4* 3.8 3.1* 3.0*  CL 104 109  --  113* 111 107  CO2 21* 23  --  25 28 26   GLUCOSE 118* 228*  --  167* 109* 99  BUN 7* 12  --  21 19 15   CREATININE 0.90 0.89  --  0.85 0.81 0.85  CALCIUM 9.6 9.0  --  8.9 8.6* 8.4*  MG 2.2 2.1  --  2.3 2.1 2.0  PHOS 3.4 2.7  --  3.6 2.2* 2.6   GFR: Estimated Creatinine Clearance: 98.1 mL/min (by C-G formula based on SCr of 0.85 mg/dL). Liver Function Tests: Recent Labs  Lab 03/26/21 1732 03/28/21 0550 03/29/21 0454  AST 28  --   --   ALT 22  --   --   ALKPHOS 71  --   --   BILITOT 1.1  --   --   PROT 8.2*  --   --   ALBUMIN 4.0 3.2* 3.0*   No results for input(s): LIPASE, AMYLASE in the last 168 hours. Recent Labs  Lab 03/26/21 1732 03/26/21 2109  AMMONIA 10 17   Coagulation Profile: No results for input(s): INR, PROTIME in the last 168 hours. Cardiac Enzymes: Recent Labs  Lab 03/26/21 2109  CKTOTAL 416*   BNP (last 3 results) No results for input(s): PROBNP in the last 8760 hours. HbA1C: Recent Labs    03/29/21 0454  HGBA1C 6.4*   CBG: Recent Labs  Lab 03/30/21 1958 03/30/21 2316 03/31/21 0332 03/31/21 0737 03/31/21 1135  GLUCAP 98 107* 93 95 98   Lipid Profile: No results for input(s): CHOL, HDL, LDLCALC, TRIG, CHOLHDL, LDLDIRECT in the last 72 hours. Thyroid Function Tests: No results for  input(s): TSH, T4TOTAL, FREET4, T3FREE, THYROIDAB in the last 72 hours. Anemia Panel: No results for input(s): VITAMINB12, FOLATE, FERRITIN, TIBC, IRON, RETICCTPCT in the last 72 hours. Sepsis Labs: Recent Labs  Lab 03/26/21 2219  PROCALCITON <0.10    Recent Results (from the past 240 hour(s))  Resp Panel by RT-PCR (Flu A&B, Covid) Nasopharyngeal Swab     Status: None   Collection Time: 03/23/21 12:42 AM   Specimen: Nasopharyngeal Swab; Nasopharyngeal(NP) swabs in vial transport medium  Result Value Ref Range Status   SARS Coronavirus 2 by RT PCR NEGATIVE NEGATIVE Final    Comment: (NOTE) SARS-CoV-2 target nucleic acids are NOT DETECTED.  The SARS-CoV-2 RNA is generally  detectable in upper respiratory specimens during the acute phase of infection. The lowest concentration of SARS-CoV-2 viral copies this assay can detect is 138 copies/mL. A negative result does not preclude SARS-Cov-2 infection and should not be used as the sole basis for treatment or other patient management decisions. A negative result may occur with  improper specimen collection/handling, submission of specimen other than nasopharyngeal swab, presence of viral mutation(s) within the areas targeted by this assay, and inadequate number of viral copies(<138 copies/mL). A negative result must be combined with clinical observations, patient history, and epidemiological information. The expected result is Negative.  Fact Sheet for Patients:  EntrepreneurPulse.com.au  Fact Sheet for Healthcare Providers:  IncredibleEmployment.be  This test is no t yet approved or cleared by the [place] FDA and  has been authorized for detection and/or diagnosis of SARS-CoV-2 by FDA under an Emergency Use Authorization (EUA). This EUA will remain  in effect (meaning this test can be used) for the duration of the COVID-19 declaration under Section 564(b)(1) of the Act, 21 U.S.C.section  360bbb-3(b)(1), unless the authorization is terminated  or revoked sooner.       Influenza A by PCR NEGATIVE NEGATIVE Final   Influenza B by PCR NEGATIVE NEGATIVE Final    Comment: (NOTE) The Xpert Xpress SARS-CoV-2/FLU/RSV plus assay is intended as an aid in the diagnosis of influenza from Nasopharyngeal swab specimens and should not be used as a sole basis for treatment. Nasal washings and aspirates are unacceptable for Xpert Xpress SARS-CoV-2/FLU/RSV testing.  Fact Sheet for Patients: EntrepreneurPulse.com.au  Fact Sheet for Healthcare Providers: IncredibleEmployment.be  This test is not yet approved or cleared by the [place] FDA and has been authorized for detection and/or diagnosis of SARS-CoV-2 by FDA under an Emergency Use Authorization (EUA). This EUA will remain in effect (meaning this test can be used) for the duration of the COVID-19 declaration under Section 564(b)(1) of the Act, 21 U.S.C. section 360bbb-3(b)(1), unless the authorization is terminated or revoked.  Performed at [facility], [address] [place], [place] [address]   Resp Panel by RT-PCR (Flu A&B, Covid) Nasopharyngeal Swab     Status: None   Collection Time: 03/26/21  5:26 PM   Specimen: Nasopharyngeal Swab; Nasopharyngeal(NP) swabs in vial transport medium  Result Value Ref Range Status   SARS Coronavirus 2 by RT PCR NEGATIVE NEGATIVE Final    Comment: (NOTE) SARS-CoV-2 target nucleic acids are NOT DETECTED.  The SARS-CoV-2 RNA is generally detectable in upper respiratory specimens during the acute phase of infection. The lowest concentration of SARS-CoV-2 viral copies this assay can detect is 138 copies/mL. A negative result does not preclude SARS-Cov-2 infection and should not be used as the sole basis for treatment or other patient management decisions. A negative result may occur with  improper specimen collection/handling, submission  of specimen other than nasopharyngeal swab, presence of viral mutation(s) within the areas targeted by this assay, and inadequate number of viral copies(<138 copies/mL). A negative result must be combined with clinical observations, patient history, and epidemiological information. The expected result is Negative.  Fact Sheet for Patients:  EntrepreneurPulse.com.au  Fact Sheet for Healthcare Providers:  IncredibleEmployment.be  This test is no t yet approved or cleared by the [place] FDA and  has been authorized for detection and/or diagnosis of SARS-CoV-2 by FDA under an Emergency Use Authorization (EUA). This EUA will remain  in effect (meaning this test can be used) for the duration of the COVID-19 declaration under Section  564(b)(1) of the Act, 21 U.S.C.section 360bbb-3(b)(1), unless the authorization is terminated  or revoked sooner.       Influenza A by PCR NEGATIVE NEGATIVE Final   Influenza B by PCR NEGATIVE NEGATIVE Final    Comment: (NOTE) The Xpert Xpress SARS-CoV-2/FLU/RSV plus assay is intended as an aid in the diagnosis of influenza from Nasopharyngeal swab specimens and should not be used as a sole basis for treatment. Nasal washings and aspirates are unacceptable for Xpert Xpress SARS-CoV-2/FLU/RSV testing.  Fact Sheet for Patients: EntrepreneurPulse.com.au  Fact Sheet for Healthcare Providers: IncredibleEmployment.be  This test is not yet approved or cleared by the [place] FDA and has been authorized for detection and/or diagnosis of SARS-CoV-2 by FDA under an Emergency Use Authorization (EUA). This EUA will remain in effect (meaning this test can be used) for the duration of the COVID-19 declaration under Section 564(b)(1) of the Act, 21 U.S.C. section 360bbb-3(b)(1), unless the authorization is terminated or revoked.  Performed at [facility], [address] [place], [place] [address]   Blood culture (routine x 2)     Status: None   Collection Time: 03/26/21  9:09 PM   Specimen: BLOOD  Result Value Ref Range Status   Specimen Description BLOOD RIGHT ANTECUBITAL  Final   Special Requests   Final    BOTTLES DRAWN AEROBIC AND ANAEROBIC Blood Culture adequate volume   Culture   Final    NO GROWTH 5 DAYS Performed at [facility], [address] [place], [place] [address]    Report Status 03/31/2021 FINAL  Final  Blood culture (routine x 2)     Status: None   Collection Time: 03/26/21 10:18 PM   Specimen: BLOOD  Result Value Ref Range Status   Specimen Description BLOOD BLOOD RIGHT HAND  Final   Special Requests   Final    BOTTLES DRAWN AEROBIC AND ANAEROBIC Blood Culture results may not be optimal due to an inadequate volume of blood received in culture bottles   Culture   Final    NO GROWTH 5 DAYS Performed at [facility], [address] [place], [place] [address]    Report Status 03/31/2021 FINAL  Final  MRSA PCR Screening     Status: None   Collection Time: 03/26/21 10:56 PM   Specimen: Nasal Mucosa; Nasopharyngeal  Result Value Ref Range Status   MRSA by PCR NEGATIVE NEGATIVE Final    Comment:        The GeneXpert MRSA Assay (FDA approved for NASAL specimens only), is one component of a comprehensive MRSA colonization surveillance program. It is not intended to diagnose MRSA infection nor to guide or monitor treatment for MRSA infections. Performed at [facility], [address] [place], [place] [address]   Culture, fungus without smear     Status: None (Preliminary result)   Collection Time: 03/27/21  2:43 PM   Specimen: CSF; Cerebrospinal Fluid  Result Value Ref Range Status   Specimen Description   Final    CSF Performed at [facility], [address] [place], [place] [address]    Special Requests   Final    Immunocompromised Performed at [facility], [address], [place], [place] [address]    Culture   Final    NO FUNGUS ISOLATED AFTER 3 DAYS Performed at [facility], [place] [address] [place], [place] [address]    Report Status PENDING  Incomplete  CSF culture w Gram  Stain     Status: None   Collection Time: 03/27/21  2:43 PM   Specimen: PATH Cytology CSF; Cerebrospinal Fluid  Result Value Ref Range Status   Specimen Description   Final    CSF Performed at [facility], [address], [place], [place] [address]    Special Requests   Final    NONE Performed at [facility], [address] [place], [place] [address]    Gram Stain   Final    WBC SEEN RBC SEEN NO ORGANISMS SEEN Performed at [facility], [address] [place], [place] [address]    Culture   Final    NO GROWTH 3 DAYS Performed at [facility], [place] [address] [place], [place] [address]    Report Status 03/30/2021 FINAL  Final  Anaerobic culture w Gram Stain     Status: None (Preliminary result)   Collection Time: 03/27/21  2:43 PM   Specimen: PATH Cytology CSF; Cerebrospinal Fluid  Result Value Ref Range Status   Specimen Description   Final    CSF Performed at [facility], [address], [place], [place] [address]    Special Requests   Final    NONE Performed at [facility], [address] [place], [place] [address]    Gram Stain   Final    NO WBC SEEN NO ORGANISMS SEEN Performed at [facility], [place] [address], [place],  [address]    Culture   Final    NO ANAEROBES ISOLATED; CULTURE IN PROGRESS FOR 5 DAYS   Report Status PENDING  Incomplete         Radiology Studies: No results found.      Scheduled Meds: . vitamin C  500 mg Oral Daily  . aspirin  81 mg Oral Daily  . chlorhexidine  15 mL Mouth Rinse BID  . Chlorhexidine Gluconate Cloth  6 each Topical Daily  . [START ON 04/01/2021] cholecalciferol  1,000 Units Oral Daily  . divalproex  250 mg Oral  Q12H  . enoxaparin (LOVENOX) injection  0.5 mg/kg Subcutaneous Q24H  . famotidine  20 mg Oral QHS  . feeding supplement  237 mL Oral TID BM  . [START ON 04/01/2021] ferrous sulfate  325 mg Oral Q breakfast  . insulin aspart  0-15 Units Subcutaneous Q4H  . lidocaine (PF)  5 mL Other Once  . losartan  100 mg Oral Daily  . mouth rinse  15 mL Mouth Rinse q12n4p  . [START ON 04/01/2021] multivitamin with minerals  1 tablet Oral Daily  . risperiDONE  2 mg Oral QHS  . rosuvastatin  5 mg Oral Daily   Continuous Infusions: . lactated ringers 75 mL/hr at 03/31/21 1405  . potassium chloride 10 mEq (03/31/21 1508)     LOS: 5 days    Time spent: 25 minutes    Sidney Ace, MD [facility] Pager [phone]  If 7PM-7AM, please contact night-coverage 03/31/2021, 4:24 PM

## 2021-03-31 NOTE — Evaluation (Signed)
Occupational Therapy Evaluation Patient Details Name: Megan Oneal MRN: [id number] DOB: 03/30/1956 Today's Date: 03/31/2021    History of Present Illness Pt is a 65 y/o female presenting with severe encephalopathy in the context of severe hypoxic respiratory failure requiring intubation. Underlying etiology most likely secondary to medication overdose at home; suspect Flexeril. PMH: HTN, DM2, DDD, anxiety, depression   Clinical Impression   Megan Oneal was seen for OT evaluation this date. Prior to hospital admission, pt was Independent for mobility and I/ADLs, pt reports driving. Pt reports sponge bathing 2/2 fear of falling in tub shower. Pt lives alone in home c 3 STE. Pt presents to acute OT demonstrating impaired ADL performance and functional mobility 2/2 decreased activity tolerance and functional strength/ROM/balance deficits, and poor insight into deficits. Pt currently requires MIN A x2 exit L side of bed. SETUP + SBA face washing seated EOB. MAX A don B socks seated EOB. MIN A x2 sit<>stand, unable to achieve upright posture - pt endorses dizziness and positive orthostatics noted. TOTAL A x2 sit>sup. Pt would benefit from skilled OT to address noted impairments and functional limitations (see below for any additional details) in order to maximize safety and independence while minimizing falls risk and caregiver burden. Upon hospital discharge, recommend STR to maximize pt safety and return to PLOF.  Supine: BP 134/70 mmHg (MAP 86) Sitting EOB: 125/51 mmHg (MAP 70) Sitting following standing trial: 112/50 mmHg (MAP 64) Supine at end of session: 124/48 mmHg (MAP 70) Highest HR observed 124 bpm    Follow Up Recommendations  SNF    Equipment Recommendations  3 in 1 bedside commode    Recommendations for Other Services       Precautions / Restrictions Precautions Precautions: Fall Restrictions Weight Bearing Restrictions: No      Mobility Bed Mobility Overal bed mobility: Needs  Assistance Bed Mobility: Supine to Sit;Sit to Supine     Supine to sit: Min assist;+2 for physical assistance;Supervision;HOB elevated Sit to supine: Total assist;+2 for physical assistance;HOB elevated   General bed mobility comments: extra time & multimodal cuing to initiate & complete supine>sit with use of bed rails, HOB elevated    Transfers Overall transfer level: Needs assistance Equipment used: 2 person hand held assist Transfers: Sit to/from Stand Sit to Stand: Min assist;+2 physical assistance;From elevated surface         General transfer comment: unable to achieve upright posture - pt reports positive orthostatic symptoms    Balance Overall balance assessment: Needs assistance Sitting-balance support: Feet supported;Bilateral upper extremity supported Sitting balance-Leahy Scale: Fair Sitting balance - Comments: supervision static sitting EOB   Standing balance support: Bilateral upper extremity supported Standing balance-Leahy Scale: Poor Standing balance comment: BUE support static standing                           ADL either performed or assessed with clinical judgement   ADL Overall ADL's : Needs assistance/impaired                                       General ADL Comments: SETUP + SBA face washing seated EOB. MAX A don B socks seated EOB.                  Pertinent Vitals/Pain Pain Assessment: Faces Faces Pain Scale: No hurt     Hand Dominance Right  Extremity/Trunk Assessment Upper Extremity Assessment Upper Extremity Assessment: Generalized weakness   Lower Extremity Assessment Lower Extremity Assessment: Generalized weakness       Communication Communication Communication: HOH   Cognition Arousal/Alertness: Lethargic Behavior During Therapy: Flat affect Overall Cognitive Status: Impaired/Different from baseline Area of Impairment: Orientation;Memory;Attention;Safety/judgement;Following  commands;Problem solving;Awareness                 Orientation Level: Disoriented to;Place;Time;Situation   Memory: Decreased recall of precautions;Decreased short-term memory Following Commands: Follows one step commands inconsistently;Follows one step commands with increased time Safety/Judgement: Decreased awareness of deficits;Decreased awareness of safety Awareness: Intellectual;Emergent;Anticipatory Problem Solving: Slow processing;Decreased initiation;Difficulty sequencing;Requires tactile cues;Requires verbal cues     General Comments       Exercises Exercises: Other exercises General Exercises - Lower Extremity Long Arc Quad: AROM;Strengthening;Both;10 reps;Seated Other Exercises Other Exercises: Pt educated re: OT role, DME recs, d/c recs, falls prevention, ECS Other Exercises: LBD, grooming, sup<>sit, sit<>stand, sitting/standing balance/tolerance   Shoulder Instructions      Home Living Family/patient expects to be discharged to:: Private residence Living Arrangements: Alone   Type of Home: House Home Access: Stairs to enter [employer] of Steps: 3 Entrance Stairs-Rails: None Home Layout: One level     Bathroom Shower/Tub: Tub/shower unit         Home Equipment: Environmental consultant - 2 wheels;Shower seat          Prior Functioning/Environment Level of Independence: Independent        Comments: pt reports independence without AD, driving        OT Problem List: Decreased strength;Decreased range of motion;Decreased activity tolerance;Impaired balance (sitting and/or standing);Decreased safety awareness;Decreased knowledge of use of DME or AE;Cardiopulmonary status limiting activity      OT Treatment/Interventions: Self-care/ADL training;Therapeutic exercise;Energy conservation;DME and/or AE instruction;Therapeutic activities;Patient/family education;Balance training    OT Goals(Current goals can be found in the care plan section) Acute Rehab  OT Goals Patient Stated Goal: none stated OT Goal Formulation: With patient Time For Goal Achievement: 04/14/21 Potential to Achieve Goals: Good ADL Goals Pt Will Perform Grooming: with modified independence;sitting Pt Will Perform Lower Body Dressing: with min assist;sitting/lateral leans Pt Will Transfer to Toilet: with min guard assist;ambulating;bedside commode (c LRAD PRN)  OT Frequency: Min 1X/week   Barriers to D/C: Decreased caregiver support          Co-evaluation PT/OT/SLP Co-Evaluation/Treatment: Yes Reason for Co-Treatment: Complexity of the patient's impairments (multi-system involvement);For patient/therapist safety;To address functional/ADL transfers PT goals addressed during session: Mobility/safety with mobility;Balance OT goals addressed during session: ADL's and self-care      AM-PAC OT "6 Clicks" Daily Activity     Outcome Measure Help from another person eating meals?: A Little Help from another person taking care of personal grooming?: A Little Help from another person toileting, which includes using toliet, bedpan, or urinal?: A Lot Help from another person bathing (including washing, rinsing, drying)?: A Lot Help from another person to put on and taking off regular upper body clothing?: A Little Help from another person to put on and taking off regular lower body clothing?: A Lot 6 Click Score: 15   End of Session    Activity Tolerance: Patient tolerated treatment well Patient left: in bed;with call bell/phone within reach;with bed alarm set  OT Visit Diagnosis: Unsteadiness on feet (R26.81);Other abnormalities of gait and mobility (R26.89);Muscle weakness (generalized) (M62.81)                Time: 4166-0630 OT Time Calculation (min):  28 min Charges:  OT General Charges $OT Visit: 1 Visit OT Evaluation $OT Eval Moderate Complexity: 1 Mod OT Treatments $Self Care/Home Management : 8-22 mins  Dessie Coma, M.S. OTR/L  03/31/21, 1:15 PM  ascom  [phone]

## 2021-03-31 NOTE — Progress Notes (Signed)
Dr. Priscella Mann notified by this RN of the patient's Yellow MEWS. No new orders at this time. Patient okay to transfer to [place] per MD.

## 2021-03-31 NOTE — Evaluation (Signed)
Physical Therapy Evaluation Patient Details Name: Megan Oneal MRN: [id number] DOB: [birth year] Today's Date: 03/31/2021   History of Present Illness  Pt is a 65 y/o female presenting with severe encephalopathy in the context of severe hypoxic respiratory failure requiring intubation. Underlying etiology most likely secondary to medication overdose at home; suspect Flexeril. PMH: HTN, DM2, DDD, anxiety, depression  Clinical Impression  Pt seen for PT evaluation with co-tx with OT for pt & therapists safety. Pt demonstrates impaired cognition, as she's only oriented to herself. Pt is able to complete supine>sit with min assist +2 & progress to standing EOB but only for ~5 seconds before requesting to sit. Pt initiates scooting to L to [facility] but ultimately requires total assist +2 to return supine. Pt does endorse lightheadedness/swimmy headedness with sitting EOB that does worsen after standing. At this time, recommending STR upon d/c to maximize independence with functional mobility & reduce fall risk prior to return home.  BP at beginning of session 134/70 mmHg (MAP 86), supine Sitting EOB: 125/51 mmHg (MAP 70) 2nd sitting after standing: 112/50 mmHg (MAP 64) Supine at end of session: 124/48 mmHg (MAP 70) Highest HR observed 124 bpm     Follow Up Recommendations SNF    Equipment Recommendations  None recommended by PT (TBD in next venue)    Recommendations for Other Services       Precautions / Restrictions Precautions Precautions: Fall Restrictions Weight Bearing Restrictions: No      Mobility  Bed Mobility Overal bed mobility: Needs Assistance Bed Mobility: Supine to Sit;Sit to Supine     Supine to sit: Min assist;+2 for physical assistance;Supervision;HOB elevated Sit to supine: Total assist;+2 for physical assistance;HOB elevated   General bed mobility comments: extra time & multimodal cuing to initiate & complete supine>sit with use of bed rails, HOB elevated     Transfers Overall transfer level: Needs assistance   Transfers: Sit to/from Stand Sit to Stand: Min assist;+2 physical assistance;From elevated surface         General transfer comment: BUE HHA  Ambulation/Gait                Stairs            Wheelchair Mobility    Modified Rankin (Stroke Patients Only)       Balance Overall balance assessment: Needs assistance Sitting-balance support: Feet supported;Bilateral upper extremity supported Sitting balance-Leahy Scale: Fair Sitting balance - Comments: supervision statit sitting EOB     Standing balance-Leahy Scale: Poor Standing balance comment: BUE support static standing                             Pertinent Vitals/Pain Pain Assessment: Faces Faces Pain Scale: No hurt    Home Living Family/patient expects to be discharged to:: Private residence Living Arrangements: Alone   Type of Home: House Home Access: Stairs to enter Entrance Stairs-Rails: None Entrance Stairs-Number of Steps: 3 Home Layout: One level Home Equipment: Environmental consultant - 2 wheels      Prior Function Level of Independence: Independent         Comments: pt reports independence without AD, driving     Hand Dominance   Dominant Hand: Right    Extremity/Trunk Assessment   Upper Extremity Assessment Upper Extremity Assessment: Generalized weakness    Lower Extremity Assessment Lower Extremity Assessment: Generalized weakness (3/5 BLE knee extension in sitting)       Communication   Communication: HOH (  recent hx of hearing loss)  Cognition Arousal/Alertness: Lethargic Behavior During Therapy: Flat affect Overall Cognitive Status: Impaired/Different from baseline Area of Impairment: Orientation;Memory;Attention;Safety/judgement;Following commands;Problem solving;Awareness                 Orientation Level: Disoriented to;Place;Time;Situation   Memory: Decreased recall of precautions;Decreased  short-term memory Following Commands: Follows one step commands inconsistently;Follows one step commands with increased time Safety/Judgement: Decreased awareness of deficits;Decreased awareness of safety Awareness: Intellectual;Emergent;Anticipatory Problem Solving: Slow processing;Decreased initiation;Difficulty sequencing;Requires tactile cues;Requires verbal cues        General Comments      Exercises General Exercises - Lower Extremity Long Arc Quad: AROM;Strengthening;Both;10 reps;Seated   Assessment/Plan    PT Assessment Patient needs continued PT services  PT Problem List Decreased strength;Decreased mobility;Decreased safety awareness;Decreased knowledge of precautions;Obesity;Decreased activity tolerance;Decreased cognition;Cardiopulmonary status limiting activity;Decreased balance;Decreased knowledge of use of DME       PT Treatment Interventions DME instruction;Therapeutic activities;Modalities;Cognitive remediation;Gait training;Therapeutic exercise;Patient/family education;Stair training;Balance training;Wheelchair mobility training;Functional mobility training;Neuromuscular re-education;Manual techniques    PT Goals (Current goals can be found in the Care Plan section)  Acute Rehab PT Goals Patient Stated Goal: none stated PT Goal Formulation: With patient Time For Goal Achievement: 04/14/21 Potential to Achieve Goals: Good    Frequency Min 2X/week   Barriers to discharge Decreased caregiver support;Inaccessible home environment      Co-evaluation PT/OT/SLP Co-Evaluation/Treatment: Yes Reason for Co-Treatment: Complexity of the patient's impairments (multi-system involvement);For patient/therapist safety;To address functional/ADL transfers PT goals addressed during session: Mobility/safety with mobility;Balance         AM-PAC PT "6 Clicks" Mobility  Outcome Measure Help needed turning from your back to your side while in a flat bed without using  bedrails?: A Lot Help needed moving from lying on your back to sitting on the side of a flat bed without using bedrails?: A Lot Help needed moving to and from a bed to a chair (including a wheelchair)?: Total Help needed standing up from a chair using your arms (e.g., wheelchair or bedside chair)?: A Lot Help needed to walk in hospital room?: Total Help needed climbing 3-5 steps with a railing? : Total 6 Click Score: 9    End of Session   Activity Tolerance: Treatment limited secondary to medical complications (Comment) Patient left: in bed;with call bell/phone within reach;with bed alarm set   PT Visit Diagnosis: Unsteadiness on feet (R26.81);Muscle weakness (generalized) (M62.81);Difficulty in walking, not elsewhere classified (R26.2)    Time: 6381-7711 PT Time Calculation (min) (ACUTE ONLY): 26 min   Charges:   PT Evaluation $PT Eval Moderate Complexity: Blanket, PT, DPT 03/31/21, 12:06 PM   Waunita Schooner 03/31/2021, 12:03 PM

## 2021-03-31 NOTE — Consult Note (Signed)
[place] for Electrolyte Monitoring and Replacement   Recent Labs: Potassium (mmol/L)  Date Value  03/31/2021 3.0 (L)  03/28/2014 3.8   Magnesium (mg/dL)  Date Value  03/31/2021 2.0   Calcium (mg/dL)  Date Value  03/31/2021 8.4 (L)   Calcium, Total (mg/dL)  Date Value  03/28/2014 8.4 (L)   Albumin (g/dL)  Date Value  03/29/2021 3.0 (L)   Phosphorus (mg/dL)  Date Value  03/31/2021 2.6   Sodium (mmol/L)  Date Value  03/31/2021 142  03/28/2014 139   Corrected Ca: 9.2 mg/dL  Assessment: Megan Oneal is a 65 y.o. female admitted on 03/26/2021 with un-responsiveness and encephalopathy. Pharmacy was consulted to monitor and replace electrolytes. On losartan, which can increase potassium levels.   MIVF: lactated ringers at 75 mL/hr.   Goal of Therapy:  Electrolytes WNL  Plan:   10 mEq IV KCl x 2 per NP  20 mEq KCl x 2 per NP  This patient is being transferred out of CCU. Because this consult was generated as part of the CCU order set pharmacy will sign out   Dallie Piles, PharmD 03/31/2021 7:19 AM

## 2021-03-31 NOTE — Progress Notes (Signed)
Infectious Disease     Reason for Consult: AMS   Referring Physician: Dr Mortimer Fries  Date of Admission:  03/26/2021   Active Problems:   Acute encephalopathy   HPI: Megan Oneal is a 65 y.o. female admitted to the ICU on April 6 with severe acute hypoxic respiratory failure and altered mental status.  She has a hx of depression/anxiety, THN, DM2, DDD.   She was found unresponsive by family members at 6:30 in the morning on April 6.  They did find a bottle of Flexeril near her.  She did have a recent ER visit a few days prior to April 4 with auditory hallucinations ongoing for several months.  She was started on Risperdal at that time this was to follow-up with PCP.   per report she also do fever to 101 at home day prior to admission. Patient required intubation in the emergency room.  She was admitted to the ICU.  Since admission she has remained unresponsive. On admission her white count was 8.8 but is increased to 15.  She was started on acyclovir, ampicillin, ceftriaxone and vancomycin.  Blood cultures have been done and are no growth to date.  Lumbar puncture was attempted but unsuccessful in the ER.  She had a normal pro calcitonin less than 0.15, HIV test is negative.  CT of the head was unremarkable Covid PCR was negative urinalysis was negative for signs of infection.  LFTs were normal TSH was normal salicylate level was negative ammonia level was normal ethanol was negative acetaminophen was negative. MRI is negative without contrast. chest x-ray showed mild right basilar atelectasis.  Abdominal x-ray was negative   4/11- extubated, no fevers. Remains a little sedated but alert and interactive. Denies pain.   Past Medical History:  Diagnosis Date  . Anxiety 03/12/2013  . Degenerative disc disease, lumbar    Epidural injections in 2007  . Diabetes mellitus without complication ([place])   . Endometriosis   . Frequent PVCs    12,000/24 hours  . Hypertension   . Otalgia, unspecified   .  Palpitations 10/04/2010  . Vascular bruit 01/2013   Right neck   Past Surgical History:  Procedure Laterality Date  . BREAST SURGERY     benign tumor excised-age 52  . CARDIOVASCULAR STRESS TEST  03/13/2008   No scintigraphic evidence of inducible myocardial ischemia, EKG negative for ischemia, no ECG changes.  . COLONOSCOPY  2009  . COLONOSCOPY N/A 09/21/2019   Procedure: COLONOSCOPY;  Surgeon: Rogene Houston, MD;  [facility];  Service: Endoscopy;  Laterality: N/A;  2:00-10:30  . CYST EXCISION  2007   Scalp  . DOPPLER ECHOCARDIOGRAPHY  03/13/2008   EF >33%, LV systolic function is normal, LV wall function is normal  . LAPAROSCOPIC CHOLECYSTECTOMY  2010  . LUMBAR LAMINECTOMY  2009    L4-L5 and L5-S1 laminectomy with diskectomy; transforaminal  . POLYPECTOMY  09/21/2019   Procedure: POLYPECTOMY;  Surgeon: Rogene Houston, MD;  Location: [facility];  Service: Endoscopy;;  . REPLACEMENT TOTAL KNEE    . SLEEP STUDY  10/29/2009   AHI-1.8/hr, during REM 6.4/hr; RDI-8.0/hr, during REM 15.0/hr; avg oxygen during REM and NREM was 94%  . TOTAL KNEE ARTHROPLASTY Bilateral   . TUBAL LIGATION     Social History   Tobacco Use  . Smoking status: Former Smoker    Packs/day: 2.00    Years: 20.00    Pack years: 40.00    Types: Cigarettes  Start date: 08/03/1972    Quit date: 08/03/1996    Years since quitting: 24.6  . Smokeless tobacco: Former Systems developer    Quit date: 12/21/1994  Substance Use Topics  . Alcohol use: No  . Drug use: No   Family History  Problem Relation Age of Onset  . Diabetes Mother   . Hypertension Mother   . Diabetes Brother   . Hypertension Brother   . Hypertension Father     Allergies:  Allergies  Allergen Reactions  . Metoprolol Other (See Comments)    Fatigue and malaise; wheezing  . Sulfa Antibiotics     Current antibiotics: Antibiotics Given (last 72 hours)    None      MEDICATIONS: . vitamin C  500 mg Oral Daily  . aspirin  81 mg Oral  Daily  . chlorhexidine  15 mL Mouth Rinse BID  . Chlorhexidine Gluconate Cloth  6 each Topical Daily  . [START ON 04/01/2021] cholecalciferol  1,000 Units Oral Daily  . divalproex  250 mg Oral Q12H  . enoxaparin (LOVENOX) injection  0.5 mg/kg Subcutaneous Q24H  . famotidine  20 mg Oral QHS  . feeding supplement  237 mL Oral TID BM  . [START ON 04/01/2021] ferrous sulfate  325 mg Oral Q breakfast  . insulin aspart  0-15 Units Subcutaneous Q4H  . lidocaine (PF)  5 mL Other Once  . losartan  100 mg Oral Daily  . mouth rinse  15 mL Mouth Rinse q12n4p  . [START ON 04/01/2021] multivitamin with minerals  1 tablet Oral Daily  . risperiDONE  2 mg Oral QHS  . rosuvastatin  5 mg Oral Daily    Review of Systems - unable to obtain  OBJECTIVE: Temp:  [98.2 F (36.8 C)-99.6 F (37.6 C)] 98.7 F (37.1 C) (04/11 1200) Pulse Rate:  [51-121] 114 (04/11 1400) Resp:  [16-26] 16 (04/11 1400) BP: (93-171)/(41-95) 130/57 (04/11 1400) SpO2:  [94 %-98 %] 96 % (04/11 1400) Weight:  [140.3 kg] 140.3 kg (04/11 0417) Physical Exam  Constitutional:  Awake, interactive, mild slowed menatation, .  HENT: [place]/AT, PERRLA, no scleral icterus Mouth/Throat: OP clear Cardiovascular: Normal rate, regular rhythm and normal heart sounds. Exam reveals no gallop and no friction rub.  No murmur heard.  Pulmonary/Chest: Effort normal and breath sounds normal. No respiratory distress.  has no wheezes.  Neck = supple, no nuchal rigidity Abdominal: Soft. Bowel sounds are normal.  exhibits no distension. There is no tenderness.  Lymphadenopathy: no cervical adenopathy. No axillary adenopathy Neurological: alet and interactvie  Skin: Skin is warm and dry. No rash noted. No erythema.  Marland Kitchen   LABS: Results for orders placed or performed during the hospital encounter of 03/26/21 (from the past 48 hour(s))  Glucose, capillary     Status: Abnormal   Collection Time: 03/29/21  3:33 PM  Result Value Ref Range   Glucose-Capillary  100 (H) 70 - 99 mg/dL    Comment: Glucose reference range applies only to samples taken after fasting for at least 8 hours.  Glucose, capillary     Status: Abnormal   Collection Time: 03/29/21  7:47 PM  Result Value Ref Range   Glucose-Capillary 111 (H) 70 - 99 mg/dL    Comment: Glucose reference range applies only to samples taken after fasting for at least 8 hours.  Glucose, capillary     Status: Abnormal   Collection Time: 03/29/21 11:59 PM  Result Value Ref Range   Glucose-Capillary 107 (H) 70 -  99 mg/dL    Comment: Glucose reference range applies only to samples taken after fasting for at least 8 hours.  Glucose, capillary     Status: None   Collection Time: 03/30/21  3:45 AM  Result Value Ref Range   Glucose-Capillary 99 70 - 99 mg/dL    Comment: Glucose reference range applies only to samples taken after fasting for at least 8 hours.  Basic metabolic panel     Status: Abnormal   Collection Time: 03/30/21  4:24 AM  Result Value Ref Range   Sodium 145 135 - 145 mmol/L   Potassium 3.1 (L) 3.5 - 5.1 mmol/L   Chloride 111 98 - 111 mmol/L   CO2 28 22 - 32 mmol/L   Glucose, Bld 109 (H) 70 - 99 mg/dL    Comment: Glucose reference range applies only to samples taken after fasting for at least 8 hours.   BUN 19 8 - 23 mg/dL   Creatinine, Ser 0.81 0.44 - 1.00 mg/dL   Calcium 8.6 (L) 8.9 - 10.3 mg/dL   GFR, Estimated >60 >60 mL/min    Comment: (NOTE) Calculated using the CKD-EPI Creatinine Equation (2021)    Anion gap 6 5 - 15    Comment: Performed at [facility], [address] [place], [place] [address]  CBC     Status: Abnormal   Collection Time: 03/30/21  4:24 AM  Result Value Ref Range   WBC 14.1 (H) 4.0 - 10.5 K/uL   RBC 3.85 (L) 3.87 - 5.11 MIL/uL   Hemoglobin 10.7 (L) 12.0 - 15.0 g/dL   HCT 33.0 (L) 36.0 - 46.0 %   MCV 85.7 80.0 - 100.0 fL   MCH 27.8 26.0 - 34.0 pg   MCHC 32.4 30.0 - 36.0 g/dL   RDW 14.6 11.5 - 15.5 %   Platelets 271 150 - 400 K/uL   nRBC  0.0 0.0 - 0.2 %    Comment: Performed at [facility], [address], [place], [place] [address]  Magnesium     Status: None   Collection Time: 03/30/21  4:24 AM  Result Value Ref Range   Magnesium 2.1 1.7 - 2.4 mg/dL    Comment: Performed at [facility], [address], [place], [place] [address]  Phosphorus     Status: Abnormal   Collection Time: 03/30/21  4:24 AM  Result Value Ref Range   Phosphorus 2.2 (L) 2.5 - 4.6 mg/dL    Comment: Performed at [facility], [address] [place],  [address]  Glucose, capillary     Status: Abnormal   Collection Time: 03/30/21  7:44 AM  Result Value Ref Range   Glucose-Capillary 113 (H) 70 - 99 mg/dL    Comment: Glucose reference range applies only to samples taken after fasting for at least 8 hours.  Glucose, capillary     Status: Abnormal   Collection Time: 03/30/21 11:25 AM  Result Value Ref Range   Glucose-Capillary 130 (H) 70 - 99 mg/dL    Comment: Glucose reference range applies only to samples taken after fasting for at least 8 hours.  Glucose, capillary     Status: Abnormal   Collection Time: 03/30/21  4:05 PM  Result Value Ref Range   Glucose-Capillary 111 (H) 70 - 99 mg/dL    Comment: Glucose reference range applies only to samples taken after fasting for at least 8 hours.  Glucose, capillary     Status: None   Collection Time:  03/30/21  7:58 PM  Result Value Ref Range   Glucose-Capillary 98 70 - 99 mg/dL    Comment: Glucose reference range applies only to samples taken after fasting for at least 8 hours.  Glucose, capillary     Status: Abnormal   Collection Time: 03/30/21 11:16 PM  Result Value Ref Range   Glucose-Capillary 107 (H) 70 - 99 mg/dL    Comment: Glucose reference range applies only to samples taken after fasting for at least 8 hours.  Glucose, capillary     Status: None   Collection Time: 03/31/21  3:32 AM  Result Value Ref Range   Glucose-Capillary 93 70 - 99 mg/dL     Comment: Glucose reference range applies only to samples taken after fasting for at least 8 hours.  Basic metabolic panel     Status: Abnormal   Collection Time: 03/31/21  4:57 AM  Result Value Ref Range   Sodium 142 135 - 145 mmol/L   Potassium 3.0 (L) 3.5 - 5.1 mmol/L   Chloride 107 98 - 111 mmol/L   CO2 26 22 - 32 mmol/L   Glucose, Bld 99 70 - 99 mg/dL    Comment: Glucose reference range applies only to samples taken after fasting for at least 8 hours.   BUN 15 8 - 23 mg/dL   Creatinine, Ser 0.85 0.44 - 1.00 mg/dL   Calcium 8.4 (L) 8.9 - 10.3 mg/dL   GFR, Estimated >60 >60 mL/min    Comment: (NOTE) Calculated using the CKD-EPI Creatinine Equation (2021)    Anion gap 9 5 - 15    Comment: Performed at [facility], [address] [place], [place] [address]  Phosphorus     Status: None   Collection Time: 03/31/21  4:57 AM  Result Value Ref Range   Phosphorus 2.6 2.5 - 4.6 mg/dL    Comment: Performed at [facility], [address] [place], [place] [address]  Magnesium     Status: None   Collection Time: 03/31/21  4:57 AM  Result Value Ref Range   Magnesium 2.0 1.7 - 2.4 mg/dL    Comment: Performed at [facility], [address] [place], [place] [address]  CBC     Status: Abnormal   Collection Time: 03/31/21  4:57 AM  Result Value Ref Range   WBC 21.5 (H) 4.0 - 10.5 K/uL   RBC 4.16 3.87 - 5.11 MIL/uL   Hemoglobin 11.4 (L) 12.0 - 15.0 g/dL   HCT 35.4 (L) 36.0 - 46.0 %   MCV 85.1 80.0 - 100.0 fL   MCH 27.4 26.0 - 34.0 pg   MCHC 32.2 30.0 - 36.0 g/dL   RDW 14.5 11.5 - 15.5 %   Platelets 233 150 - 400 K/uL   nRBC 0.1 0.0 - 0.2 %    Comment: Performed at [facility], [address] [place], [place] [address]  Glucose, capillary     Status: None   Collection Time: 03/31/21  7:37 AM  Result Value Ref Range   Glucose-Capillary 95 70 - 99 mg/dL    Comment: Glucose reference range applies only to samples taken after fasting for at  least 8 hours.  Glucose, capillary     Status: None   Collection Time: 03/31/21 11:35 AM  Result Value Ref Range   Glucose-Capillary 98 70 - 99 mg/dL    Comment: Glucose reference range applies only to samples taken after fasting for at least 8 hours.   No  components found for: ESR, C REACTIVE PROTEIN MICRO: Recent Results (from the past 720 hour(s))  Resp Panel by RT-PCR (Flu A&B, Covid) Nasopharyngeal Swab     Status: None   Collection Time: 03/23/21 12:42 AM   Specimen: Nasopharyngeal Swab; Nasopharyngeal(NP) swabs in vial transport medium  Result Value Ref Range Status   SARS Coronavirus 2 by RT PCR NEGATIVE NEGATIVE Final    Comment: (NOTE) SARS-CoV-2 target nucleic acids are NOT DETECTED.  The SARS-CoV-2 RNA is generally detectable in upper respiratory specimens during the acute phase of infection. The lowest concentration of SARS-CoV-2 viral copies this assay can detect is 138 copies/mL. A negative result does not preclude SARS-Cov-2 infection and should not be used as the sole basis for treatment or other patient management decisions. A negative result may occur with  improper specimen collection/handling, submission of specimen other than nasopharyngeal swab, presence of viral mutation(s) within the areas targeted by this assay, and inadequate number of viral copies(<138 copies/mL). A negative result must be combined with clinical observations, patient history, and epidemiological information. The expected result is Negative.  Fact Sheet for Patients:  EntrepreneurPulse.com.au  Fact Sheet for Healthcare Providers:  IncredibleEmployment.be  This test is no t yet approved or cleared by the [place] FDA and  has been authorized for detection and/or diagnosis of SARS-CoV-2 by FDA under an Emergency Use Authorization (EUA). This EUA will remain  in effect (meaning this test can be used) for the duration of the COVID-19 declaration  under Section 564(b)(1) of the Act, 21 U.S.C.section 360bbb-3(b)(1), unless the authorization is terminated  or revoked sooner.       Influenza A by PCR NEGATIVE NEGATIVE Final   Influenza B by PCR NEGATIVE NEGATIVE Final    Comment: (NOTE) The Xpert Xpress SARS-CoV-2/FLU/RSV plus assay is intended as an aid in the diagnosis of influenza from Nasopharyngeal swab specimens and should not be used as a sole basis for treatment. Nasal washings and aspirates are unacceptable for Xpert Xpress SARS-CoV-2/FLU/RSV testing.  Fact Sheet for Patients: EntrepreneurPulse.com.au  Fact Sheet for Healthcare Providers: IncredibleEmployment.be  This test is not yet approved or cleared by the [place] FDA and has been authorized for detection and/or diagnosis of SARS-CoV-2 by FDA under an Emergency Use Authorization (EUA). This EUA will remain in effect (meaning this test can be used) for the duration of the COVID-19 declaration under Section 564(b)(1) of the Act, 21 U.S.C. section 360bbb-3(b)(1), unless the authorization is terminated or revoked.  Performed at [facility], [address] [place], [place] [address]   Resp Panel by RT-PCR (Flu A&B, Covid) Nasopharyngeal Swab     Status: None   Collection Time: 03/26/21  5:26 PM   Specimen: Nasopharyngeal Swab; Nasopharyngeal(NP) swabs in vial transport medium  Result Value Ref Range Status   SARS Coronavirus 2 by RT PCR NEGATIVE NEGATIVE Final    Comment: (NOTE) SARS-CoV-2 target nucleic acids are NOT DETECTED.  The SARS-CoV-2 RNA is generally detectable in upper respiratory specimens during the acute phase of infection. The lowest concentration of SARS-CoV-2 viral copies this assay can detect is 138 copies/mL. A negative result does not preclude SARS-Cov-2 infection and should not be used as the sole basis for treatment or other patient management decisions. A negative result may occur with   improper specimen collection/handling, submission of specimen other than nasopharyngeal swab, presence of viral mutation(s) within the areas targeted by this assay, and inadequate number of viral copies(<138 copies/mL). A negative result must be combined  with clinical observations, patient history, and epidemiological information. The expected result is Negative.  Fact Sheet for Patients:  EntrepreneurPulse.com.au  Fact Sheet for Healthcare Providers:  IncredibleEmployment.be  This test is no t yet approved or cleared by the [place] FDA and  has been authorized for detection and/or diagnosis of SARS-CoV-2 by FDA under an Emergency Use Authorization (EUA). This EUA will remain  in effect (meaning this test can be used) for the duration of the COVID-19 declaration under Section 564(b)(1) of the Act, 21 U.S.C.section 360bbb-3(b)(1), unless the authorization is terminated  or revoked sooner.       Influenza A by PCR NEGATIVE NEGATIVE Final   Influenza B by PCR NEGATIVE NEGATIVE Final    Comment: (NOTE) The Xpert Xpress SARS-CoV-2/FLU/RSV plus assay is intended as an aid in the diagnosis of influenza from Nasopharyngeal swab specimens and should not be used as a sole basis for treatment. Nasal washings and aspirates are unacceptable for Xpert Xpress SARS-CoV-2/FLU/RSV testing.  Fact Sheet for Patients: EntrepreneurPulse.com.au  Fact Sheet for Healthcare Providers: IncredibleEmployment.be  This test is not yet approved or cleared by the [place] FDA and has been authorized for detection and/or diagnosis of SARS-CoV-2 by FDA under an Emergency Use Authorization (EUA). This EUA will remain in effect (meaning this test can be used) for the duration of the COVID-19 declaration under Section 564(b)(1) of the Act, 21 U.S.C. section 360bbb-3(b)(1), unless the authorization is terminated  or revoked.  Performed at [facility], [address] [place], [place] [address]   Blood culture (routine x 2)     Status: None   Collection Time: 03/26/21  9:09 PM   Specimen: BLOOD  Result Value Ref Range Status   Specimen Description BLOOD RIGHT ANTECUBITAL  Final   Special Requests   Final    BOTTLES DRAWN AEROBIC AND ANAEROBIC Blood Culture adequate volume   Culture   Final    NO GROWTH 5 DAYS Performed at [facility], [address] [place], [place] [address]    Report Status 03/31/2021 FINAL  Final  Blood culture (routine x 2)     Status: None   Collection Time: 03/26/21 10:18 PM   Specimen: BLOOD  Result Value Ref Range Status   Specimen Description BLOOD BLOOD RIGHT HAND  Final   Special Requests   Final    BOTTLES DRAWN AEROBIC AND ANAEROBIC Blood Culture results may not be optimal due to an inadequate volume of blood received in culture bottles   Culture   Final    NO GROWTH 5 DAYS Performed at [facility], [address] [place], [place] [address]    Report Status 03/31/2021 FINAL  Final  MRSA PCR Screening     Status: None   Collection Time: 03/26/21 10:56 PM   Specimen: Nasal Mucosa; Nasopharyngeal  Result Value Ref Range Status   MRSA by PCR NEGATIVE NEGATIVE Final    Comment:        The GeneXpert MRSA Assay (FDA approved for NASAL specimens only), is one component of a comprehensive MRSA colonization surveillance program. It is not intended to diagnose MRSA infection nor to guide or monitor treatment for MRSA infections. Performed at [facility], [address] [place], [place] [address]   Culture, fungus without smear     Status: None (Preliminary result)   Collection Time: 03/27/21  2:43 PM   Specimen: CSF; Cerebrospinal Fluid  Result Value Ref Range Status   Specimen Description  Final    CSF Performed at [facility], [address], [place], [place] [address]    Special Requests    Final    Immunocompromised Performed at [facility], [address], [place], [place] [address]    Culture   Final    NO FUNGUS ISOLATED AFTER 3 DAYS Performed at [facility], [place] [address] [place], [place] [address]    Report Status PENDING  Incomplete  CSF culture w Gram Stain     Status: None   Collection Time: 03/27/21  2:43 PM   Specimen: PATH Cytology CSF; Cerebrospinal Fluid  Result Value Ref Range Status   Specimen Description   Final    CSF Performed at [facility], [address], [place], [place] [address]    Special Requests   Final    NONE Performed at [facility] [address] [place], [place] [address]    Gram Stain   Final    WBC SEEN RBC SEEN NO ORGANISMS SEEN Performed at [facility], [address] [place], [place] [address]    Culture   Final    NO GROWTH 3 DAYS Performed at [facility], [place] [address], [place], [place] [address]    Report Status 03/30/2021 FINAL  Final  Anaerobic culture w Gram Stain     Status: None (Preliminary result)   Collection Time: 03/27/21  2:43 PM   Specimen: PATH Cytology CSF; Cerebrospinal Fluid  Result Value Ref Range Status   Specimen Description   Final    CSF Performed at [facility], [address], [place], [place] [address]    Special Requests   Final    NONE Performed at [facility], [address] [place], [place] [address]    Gram Stain   Final    NO WBC SEEN NO ORGANISMS SEEN Performed at [facility], [place] [address] [place], [place] [address]    Culture   Final    NO ANAEROBES ISOLATED; CULTURE IN PROGRESS FOR 5 DAYS   Report Status PENDING  Incomplete    IMAGING: DG Chest 1 View  Result Date: 03/28/2021 CLINICAL DATA:  Hypoxia.  Central catheter placement EXAM: CHEST  1 VIEW COMPARISON:  March 28, 2021 study obtained earlier in the day FINDINGS: Central catheter tip is at the cavoatrial junction.  Endotracheal tube tip is 3.1 cm above the carina. Nasogastric tube tip and side port in stomach. No pneumothorax. There is mild left base atelectasis. The lungs elsewhere are clear. Heart is enlarged with pulmonary vascularity within normal limits. No adenopathy. No bone lesions. IMPRESSION: Tube and catheter positions as described without pneumothorax. No edema or consolidation. Left base atelectasis. There is cardiomegaly. No adenopathy. Electronically Signed   By: Lowella Grip III M.D.   On: 03/28/2021 13:11   CT Head Wo Contrast  Result Date: 03/26/2021 CLINICAL DATA:  Change in mental status.  Unknown cause. EXAM: CT HEAD WITHOUT CONTRAST TECHNIQUE: Contiguous axial images were obtained from the base of the skull through the vertex without intravenous contrast. COMPARISON:  MRI brain 03/21/2021.  CT head 03/21/2021 FINDINGS: Brain: No evidence of acute infarction, hemorrhage, hydrocephalus, extra-axial collection or mass lesion/mass effect. Vascular: No hyperdense vessel or unexpected calcification. Skull: Calvarium appears intact. Sinuses/Orbits: Paranasal sinuses and mastoid air cells are clear. Other: Congenital nonunion of the posterior arch of C1. IMPRESSION: No acute intracranial abnormalities. Electronically Signed   By: Lucienne Capers  M.D.   On: 03/26/2021 19:08   CT Head Wo Contrast  Result Date: 03/21/2021 CLINICAL DATA:  Neuro deficit, acute, stroke suspected. Additional history provided: Patient reports dizziness EXAM: CT HEAD WITHOUT CONTRAST TECHNIQUE: Contiguous axial images were obtained from the base of the skull through the vertex without intravenous contrast. COMPARISON:  No pertinent prior exams available for comparison. FINDINGS: Brain: Cerebral volume is normal. There is no acute intracranial hemorrhage. No demarcated cortical infarct. No extra-axial fluid collection. No evidence of intracranial mass. No midline shift. Vascular: No hyperdense vessel.  Atherosclerotic  calcifications. Skull: Normal. Negative for fracture or focal lesion. Sinuses/Orbits: Visualized orbits show no acute finding. No significant paranasal sinus disease at the imaged levels. Other: Congenital nonunion of the posterior arch of C1. IMPRESSION: Unremarkable non-contrast CT appearance of the brain. No evidence of acute intracranial abnormality. Electronically Signed   By: Kellie Simmering DO   On: 03/21/2021 12:38   MR BRAIN WO CONTRAST  Result Date: 03/27/2021 CLINICAL DATA:  Mental status change. EXAM: MRI HEAD WITHOUT CONTRAST TECHNIQUE: Multiplanar, multiecho pulse sequences of the brain and surrounding structures were obtained without intravenous contrast. COMPARISON:  Head CT March 26, 2021 FINDINGS: Brain: No acute infarction, hemorrhage, hydrocephalus, extra-axial collection or mass lesion. A few small foci of T2 hyperintensity are seen within the white matter of the cerebral hemispheres, nonspecific. Vascular: Normal flow voids. Skull and upper cervical spine: Normal marrow signal. Sinuses/Orbits: Negative. IMPRESSION: 1. No acute intracranial abnormality. 2. A few small foci of T2 hyperintensity within the white matter of the cerebral hemispheres, nonspecific but may be seen with chronic microvascular ischemic changes. Electronically Signed   By: Pedro Earls M.D.   On: 03/27/2021 14:43   MR BRAIN WO CONTRAST  Result Date: 03/21/2021 CLINICAL DATA:  Dizzy EXAM: MRI HEAD WITHOUT CONTRAST TECHNIQUE: Multiplanar, multiecho pulse sequences of the brain and surrounding structures were obtained without intravenous contrast. COMPARISON:  None. FINDINGS: Brain: There is no acute infarction or intracranial hemorrhage. There is no intracranial mass, mass effect, or edema. There is no hydrocephalus or extra-axial fluid collection. Ventricles and sulci are normal in size and configuration. Few punctate foci of T2 hyperintensity are present in the supratentorial white matter and may reflect  minor chronic microvascular ischemic changes or gliosis/demyelination of other etiologies. Vascular: Major vessel flow voids at the skull base are preserved. Skull and upper cervical spine: Normal marrow signal is preserved. Sinuses/Orbits: Trace mucosal thickening.  Orbits are unremarkable. Other: Sella is unremarkable.  Mastoid air cells are clear. IMPRESSION: No evidence of recent infarction, hemorrhage, or mass. Probable minor chronic microvascular ischemic changes. Electronically Signed   By: Macy Mis M.D.   On: 03/21/2021 14:53   DG Chest Port 1 View  Result Date: 03/28/2021 CLINICAL DATA:  Acute respiratory failure with hypoxia. EXAM: PORTABLE CHEST 1 VIEW COMPARISON:  03/26/2021 FINDINGS: Endotracheal and enteric tubes are unchanged in position. Shallow inspiration. Mild cardiac enlargement. Prominent pulmonary vascularity with bilateral basilar atelectasis. No definite pleural effusion or pneumothorax. Degenerative changes in the spine. IMPRESSION: Shallow inspiration with bilateral basilar atelectasis. Cardiac enlargement with pulmonary vascular congestion, similar to prior study. Electronically Signed   By: Lucienne Capers M.D.   On: 03/28/2021 01:21   DG Chest Portable 1 View  Result Date: 03/26/2021 CLINICAL DATA:  Intubation, placement of or of gastric tube, altered mental status EXAM: PORTABLE CHEST 1 VIEW COMPARISON:  Portable exam 1948 hours compared to 1803 hours FINDINGS: Tip of endotracheal tube projects 2.2  cm above carina. Orogastric tube extends into stomach. Enlargement of cardiac silhouette with vascular congestion. Mild RIGHT basilar atelectasis. Remaining lungs clear. No pleural effusion or pneumothorax. IMPRESSION: Mild RIGHT basilar atelectasis. Tube positions as above. Electronically Signed   By: Lavonia Dana M.D.   On: 03/26/2021 20:17   DG Chest Portable 1 View  Result Date: 03/26/2021 CLINICAL DATA:  Altered mental status, hypertension, diabetes mellitus EXAM: PORTABLE  CHEST 1 VIEW COMPARISON:  Portable exam 1812 hours compared to 03/22/2021 FINDINGS: Enlargement of cardiac silhouette with pulmonary vascular congestion. Mediastinal contour stable. Decreased lung volumes with minimal bibasilar atelectasis. No acute infiltrate, pleural effusion, or pneumothorax. IMPRESSION: Enlargement of cardiac silhouette. Low lung volumes with bibasilar atelectasis. Electronically Signed   By: Lavonia Dana M.D.   On: 03/26/2021 18:41   DG Chest Portable 1 View  Result Date: 03/22/2021 CLINICAL DATA:  Chest tightness. EXAM: PORTABLE CHEST 1 VIEW COMPARISON:  July 23, 2008 FINDINGS: Cardiomegaly. The hila and mediastinum are unremarkable. No pneumothorax. Mild interstitial prominence without overt edema. No suspicious infiltrates. IMPRESSION: Cardiomegaly. Suspected pulmonary venous congestion. No overt edema. Electronically Signed   By: Dorise Bullion III M.D   On: 03/22/2021 18:31   DG Abd Portable 1 View  Result Date: 03/26/2021 CLINICAL DATA:  Orogastric tube placement EXAM: PORTABLE ABDOMEN - 1 VIEW COMPARISON:  Portable exam 1949 hours without priors for comparison FINDINGS: Orogastric tube coiled in proximal stomach. Visualized bowel gas pattern normal. Prior lumbar fusion. Borderline enlargement of cardiac silhouette. Surgical clips RIGHT upper quadrant likely reflect prior cholecystectomy. IMPRESSION: Nasogastric tube coiled in proximal stomach. Electronically Signed   By: Lavonia Dana M.D.   On: 03/26/2021 20:16   DG FL GUIDED LUMBAR PUNCTURE  Result Date: 03/27/2021 CLINICAL DATA:  Acute encephalopathy. EXAM: DIAGNOSTIC LUMBAR PUNCTURE UNDER FLUOROSCOPIC GUIDANCE COMPARISON:  MRI lumbar spine 04/25/2008. FLUOROSCOPY TIME:  Fluoroscopy Time:  0 minutes 54 seconds Radiation Exposure Index (if provided by the fluoroscopic device): 42.9 mGy Number of Acquired Spot Images: 1 PROCEDURE: After discussing the risks and benefits of this procedure with the patient's sister informed  consent was obtained. Risk of bleeding, infection, allergy, and need for blood patch discussed with patient's sister. With the patient prone, the lower back was prepped with Betadine. 1% Lidocaine was used for local anesthesia. Lumbar puncture was performed at the L5-S1 level using a 22 gauge needle with return of clear CSF. Opening pressure not obtained due to patient's clinical condition. 8 ml of CSF were obtained for laboratory studies. The patient tolerated the procedure well and there were no apparent complications. IMPRESSION: Successful fluoroscopically directed lumbar puncture. 8 cc of clear CSF obtained and sent to the laboratory for ordered studies. Electronically Signed   By: Marcello Moores  Register   On: 03/27/2021 14:59   Assessment:   Megan Oneal is a 65 y.o. female With a history of multiple medical problems including depression anxiety, hypertension, type 2 diabetes who was admitted after period of unresponsiveness of unclear duration with a bottle of Flexeril next to her.  She had recently had an ER visit where she was noting auditory hallucinations ongoing for several months and was started on Risperdal at that visit.  On this admission she was intubated and has been unresponsive.  Work-up including chest x-ray MRI of the brain abdominal x-ray and labs have all been negative.  HIV is negative.  Procalcitonin is normal.  Is unclear etiology but I suspect potential drug overdose since her tox screen was positive for  tricyclics which Flexeril is.   4/11- extubated and much improved. Off abx and no fevers but wbc up some. All cultures neg and HSV PCR neg  Recommendations Cont off abx and monitor WBC is up some but she received 22 mg decadron several times a day for 7 total doses.  Will likely remain elevated for next few days due to this high level of steroids. I will sign off but please call with questions.  Cheral Marker. Ola Spurr, MD

## 2021-03-31 NOTE — Progress Notes (Signed)
Nutrition Follow Up Note   DOCUMENTATION CODES:   Morbid obesity  INTERVENTION:   Ensure Enlive po TID, each supplement provides 350 kcal and 20 grams of protein  MVI po daily   NUTRITION DIAGNOSIS:   Inadequate oral intake related to inability to eat (pt sedated and ventilated) as evidenced by NPO status.  GOAL:   Patient will meet greater than or equal to 90% of their needs  -previously met with tube feeds   MONITOR:   PO intake,Supplement acceptance,Labs,Weight trends,Skin,I & O's  ASSESSMENT:   65 y/o female with h/o anxiety, depression, endometriosis, HTN, DJD and DM who is admitted with unresponsiveness   Pt extubated 4/9. Pt initiated on a regular diet yesterday; pt documented to be eating 25% of meals in hospital. RD will add supplements and MVI to help pt meet her estimated needs.  Per chart, pt down ~9lbs(3%) since admit.   Medications reviewed and include: vitamin C, aspirin, D3, lovenox, pepcid, ferrous sulfate, MVI,  Insulin, LRS @75ml/hr, KCl  Labs reviewed: K 3.0(L), P 2.6 wnl, Mg 2.0 wnl Wbc- 21.5(H)  Diet Order:    Diet Order            Diet heart healthy/carb modified Room service appropriate? Yes; Fluid consistency: Thin  Diet effective now                EDUCATION NEEDS:   No education needs have been identified at this time  Skin:  Skin Assessment: Reviewed RN Assessment  Last BM:  4/10 -type 6  Height:   Ht Readings from Last 1 Encounters:  03/26/21 5' 5.98" (1.676 m)    Weight:   Wt Readings from Last 1 Encounters:  03/31/21 (!) 140.3 kg    Ideal Body Weight:  59 kg  BMI:  Body mass index is 49.95 kg/m.  Estimated Nutritional Needs:   Kcal:  2400-2700kcal/day  Protein:  >120g/day  Fluid:  1.8-2.1L/day    MS, RD, LDN Please refer to [facility] for RD and/or RD on-call/weekend/after hours pager 

## 2021-04-01 DIAGNOSIS — F29 Unspecified psychosis not due to a substance or known physiological condition: Secondary | ICD-10-CM | POA: Diagnosis not present

## 2021-04-01 LAB — GLUCOSE, CAPILLARY
Glucose-Capillary: 114 mg/dL — ABNORMAL HIGH (ref 70–99)
Glucose-Capillary: 114 mg/dL — ABNORMAL HIGH (ref 70–99)
Glucose-Capillary: 132 mg/dL — ABNORMAL HIGH (ref 70–99)
Glucose-Capillary: 128 mg/dL — ABNORMAL HIGH (ref 70–99)
Glucose-Capillary: 105 mg/dL — ABNORMAL HIGH (ref 70–99)

## 2021-04-01 NOTE — Progress Notes (Signed)
Physical Therapy Treatment Patient Details Name: Megan Oneal MRN: [id number] DOB: 10-07-1956 Today's Date: 04/01/2021    History of Present Illness Pt is a 65 y/o female presenting with severe encephalopathy in the context of severe hypoxic respiratory failure requiring intubation. Underlying etiology most likely secondary to medication overdose at home; suspect Flexeril. PMH: HTN, DM2, DDD, anxiety, depression    PT Comments    Pt was long sitting in bed on bed pan upon arriving. Supportive daughter at bedside and very encouraging. Pt unsuccessful BM. She did agree to PT session and OOB activity. Was able to exit R side of bed, stand 4 x to bariatric RW. Was able to march in place 2 x 10 prior to side stepping from FOB to [facility]. Sao2 > 93% throughout session with HR between 65 and 82 bpm. Pt does need frequent rest but overall tolerated session well. RN was in/out of room during session. Overall pt demonstrated much improved abilities today. Still recommend DC to SNF to address deficits while assisting pt to PLOF.    Follow Up Recommendations  SNF     Equipment Recommendations  Other (comment) (defer to next level of care)       Precautions / Restrictions Precautions Precautions: Fall Restrictions Weight Bearing Restrictions: No    Mobility  Bed Mobility Overal bed mobility: Needs Assistance Bed Mobility: Supine to Sit;Sit to Supine     Supine to sit: Min assist;Mod assist (+1) Sit to supine: Min assist   General bed mobility comments: Pt required extra time however was able to exit R sid eof bed with min-mod assist of one. Vcs tghroughout for technique and safety. min assist progressing BLEs into bed after OOB activity    Transfers Overall transfer level: Needs assistance Equipment used: Rolling walker (2 wheeled) (Bariatric) Transfers: Sit to/from Stand Sit to Stand: Min assist;From elevated surface         General transfer comment: Pt performed STS 4 x EOB with min  assist only.  Ambulation/Gait Ambulation/Gait assistance: Min assist Gait Distance (Feet): 4 Feet Assistive device: Rolling walker (2 wheeled) (bariatric) Gait Pattern/deviations: Step-to pattern     General Gait Details: Pt was able to alternate march in place 10x for 2 trials prior to taking ~ 4 steps from FOB to [facility]. activity limited by fatigue.       Balance Overall balance assessment: Needs assistance Sitting-balance support: Feet supported Sitting balance-Leahy Scale: Good Sitting balance - Comments: sat EOB with supervision x > 12 minutes throughout session   Standing balance support: Bilateral upper extremity supported;During functional activity Standing balance-Leahy Scale: Fair Standing balance comment: Is reliant on UE support during dynamic standing activity         Cognition Arousal/Alertness: Awake/alert Behavior During Therapy: WFL for tasks assessed/performed Overall Cognitive Status: Impaired/Different from baseline Area of Impairment: Orientation;Memory;Attention;Safety/judgement;Following commands;Problem solving;Awareness      Orientation Level: Disoriented to;Time;Situation Current Attention Level: Alternating Memory: Decreased recall of precautions;Decreased short-term memory Following Commands: Follows one step commands inconsistently;Follows one step commands with increased time Safety/Judgement: Decreased awareness of safety;Decreased awareness of deficits Awareness: Intellectual Problem Solving: Slow processing;Decreased initiation;Difficulty sequencing;Requires verbal cues;Requires tactile cues General Comments: Pt was much more alert today versus on evaluation. Agreeable to session and attempting OOB activity             Pertinent Vitals/Pain Pain Assessment: No/denies pain           PT Goals (current goals can now be found in the care plan section)  Acute Rehab PT Goals Patient Stated Goal: none stated Progress towards PT goals:  Progressing toward goals    Frequency    Min 2X/week      PT Plan Current plan remains appropriate    Co-evaluation     PT goals addressed during session: Mobility/safety with mobility;Balance;Proper use of DME;Strengthening/ROM        AM-PAC PT "6 Clicks" Mobility   Outcome Measure  Help needed turning from your back to your side while in a flat bed without using bedrails?: A Little Help needed moving from lying on your back to sitting on the side of a flat bed without using bedrails?: A Little Help needed moving to and from a bed to a chair (including a wheelchair)?: A Lot Help needed standing up from a chair using your arms (e.g., wheelchair or bedside chair)?: A Lot Help needed to walk in hospital room?: A Lot Help needed climbing 3-5 steps with a railing? : A Lot 6 Click Score: 14    End of Session   Activity Tolerance: Patient tolerated treatment well;Patient limited by fatigue Patient left: in bed;with call bell/phone within reach;with bed alarm set;with family/visitor present Nurse Communication: Mobility status PT Visit Diagnosis: Unsteadiness on feet (R26.81);Muscle weakness (generalized) (M62.81);Difficulty in walking, not elsewhere classified (R26.2)     Time: 3428-7681 PT Time Calculation (min) (ACUTE ONLY): 26 min  Charges:  $Therapeutic Activity: 23-37 mins                    Julaine Fusi PTA 04/01/21, 4:28 PM

## 2021-04-01 NOTE — Progress Notes (Signed)
PROGRESS NOTE    Megan Oneal  ACZ:[id number] DOB: 1956/09/11 DOA: 03/26/2021 PCP: Sharilyn Sites, MD   Brief Narrative: 65 yo female who recently presented to [facility] ER on 04/4 with auditory hallucinations that started a couple of months prior to that ER presentation.  At that time she was evaluated by psychiatry and denied suicidal or homicidal ideations  She did not require hospital admission but was instructed to start Risperdal 0.5 mg bid and to follow-up with her PCP and Otolaryngologist. She presented again to [facility] ER on 04/6 via EMS with unresponsiveness with reported last well known time at 06:30 am on 04/6.  Family also reported finding a flexiril bottle near the pt. She was admitted with Acute Metabolic Encephalopathy in setting of suspected CNS infection possible Meninginitis vs. Drug overdose (? Flexeril), along with Acute Hypoxic Respiratory Failure requiring intubation.  Patient was successfully extubated on 4/9.  Transfer to [facility] hospitalist service.  Remains hemodynamically stable.  Suspected Flexeril overdose as cause.  Repeat EEG ordered and pending.  Case discussed with neurology.  They will sign off if EEG negative.  Appreciate psychiatry recommendations.  Encephalopathy persistent.  Continues to endorse auditory hallucinations.   Assessment & Plan:   Active Problems:   Acute encephalopathy  Acute toxic encephalopathy secondary to suspected drug overdose No clinical evidence of CNS infection Discussed with neurology, no intervention from their standpoint EEG pending Plan: Follow psychiatry recommendations Depakote and Risperdal Follow-up EEG No antibiotics, ID signed off  Acute hypoxic respiratory failure Status post extubation 4/9 On room air  Hypertension CAD Resume home aspirin Resume home losartan and rosuvastatin As needed hydralazine  Auditory hallucinations Remote history of otosclerosis Suspected overdose on Flexeril UDS positive for  TCA Plan: Continue Depakote and Restoril Appreciate psychiatry follow-up  Morbid obesity BMI 10.9 This complicates overall care and prognosis   DVT prophylaxis: SQ Lovenox Code Status: Full Family Communication: Daughter at bedside 4/12 Disposition Plan: Status is: Inpatient  Remains inpatient appropriate because:Inpatient level of care appropriate due to severity of illness   Dispo: The patient is from: Home              Anticipated d/c is to: Home              Patient currently is not medically stable to d/c.   Difficult to place patient No  MedSurg status.  Encephalopathy persistent.  Anticipate patient will need placement at time of discharge.  Would benefit from further psychiatry follow-up during this admission.     Level of care: Med-Surg  Consultants:   Neurology  Psychiatry   Procedures:None  Antimicrobials:   None   Subjective: Patient seen and examined.  History somewhat limited by tangential thought.  Daughter at bedside.  Continues to endorse hallucinations.  Objective: Vitals:   04/01/21 1105 04/01/21 1200 04/01/21 1300 04/01/21 1400  BP: (!) 124/57 112/70 (!) 116/42 117/66  Pulse: (!) 111 (!) 111 (!) 58 (!) 119  Resp: (!) 21 17 (!) 21 (!) 32  Temp:  98.7 F (37.1 C)    TempSrc:  Oral    SpO2: 98% 98% 98% 97%  Weight:      Height:        Intake/Output Summary (Last 24 hours) at 04/01/2021 1502 Last data filed at 04/01/2021 1400 Gross per 24 hour  Intake 2878.67 ml  Output 1800 ml  Net 1078.67 ml   Filed Weights   03/30/21 0429 03/31/21 0417 04/01/21 0441  Weight: (!) 140.1 kg Marland Kitchen)  140.3 kg (!) 140.1 kg    Examination:  General exam: No acute distress Respiratory system: Bibasilar crackles.  Normal work of breathing.  Room air Cardiovascular system: S1 & S2 heard, RRR. No JVD, murmurs, rubs, gallops or clicks. No pedal edema. Gastrointestinal system: Obese, nontender, nondistended, normal bowel sounds Central nervous system:  Lethargic, oriented x2, no focal deficits extremities: Symmetric 5 x 5 power. Skin: No rashes, lesions or ulcers Psychiatry: Judgement and insight appear impaired. Mood & affect flattened.     Data Reviewed: I have personally reviewed following labs and imaging studies  CBC: Recent Labs  Lab 03/26/21 1732 03/27/21 0516 03/28/21 0550 03/29/21 0454 03/30/21 0424 03/31/21 0457  WBC 8.8 15.3* 15.9* 16.6* 14.1* 21.5*  NEUTROABS 5.6  --   --   --   --   --   HGB 12.3 12.8 11.8* 10.5* 10.7* 11.4*  HCT 38.4 40.0 36.2 33.7* 33.0* 35.4*  MCV 85.0 86.2 84.8 86.9 85.7 85.1  PLT 290 304 292 266 271 446   Basic Metabolic Panel: Recent Labs  Lab 03/27/21 0516 03/28/21 0550 03/28/21 2121 03/29/21 0454 03/30/21 0424 03/31/21 0457  NA 141 141  --  144 145 142  K 3.6 3.2* 3.4* 3.8 3.1* 3.0*  CL 104 109  --  113* 111 107  CO2 21* 23  --  25 28 26   GLUCOSE 118* 228*  --  167* 109* 99  BUN 7* 12  --  21 19 15   CREATININE 0.90 0.89  --  0.85 0.81 0.85  CALCIUM 9.6 9.0  --  8.9 8.6* 8.4*  MG 2.2 2.1  --  2.3 2.1 2.0  PHOS 3.4 2.7  --  3.6 2.2* 2.6   GFR: Estimated Creatinine Clearance: 98 mL/min (by C-G formula based on SCr of 0.85 mg/dL). Liver Function Tests: Recent Labs  Lab 03/26/21 1732 03/28/21 0550 03/29/21 0454  AST 28  --   --   ALT 22  --   --   ALKPHOS 71  --   --   BILITOT 1.1  --   --   PROT 8.2*  --   --   ALBUMIN 4.0 3.2* 3.0*   No results for input(s): LIPASE, AMYLASE in the last 168 hours. Recent Labs  Lab 03/26/21 1732 03/26/21 2109  AMMONIA 10 17   Coagulation Profile: No results for input(s): INR, PROTIME in the last 168 hours. Cardiac Enzymes: Recent Labs  Lab 03/26/21 2109  CKTOTAL 416*   BNP (last 3 results) No results for input(s): PROBNP in the last 8760 hours. HbA1C: No results for input(s): HGBA1C in the last 72 hours. CBG: Recent Labs  Lab 03/31/21 1933 03/31/21 2307 04/01/21 0409 04/01/21 0739 04/01/21 1128  GLUCAP 99 106*  114* 114* 132*   Lipid Profile: No results for input(s): CHOL, HDL, LDLCALC, TRIG, CHOLHDL, LDLDIRECT in the last 72 hours. Thyroid Function Tests: No results for input(s): TSH, T4TOTAL, FREET4, T3FREE, THYROIDAB in the last 72 hours. Anemia Panel: No results for input(s): VITAMINB12, FOLATE, FERRITIN, TIBC, IRON, RETICCTPCT in the last 72 hours. Sepsis Labs: Recent Labs  Lab 03/26/21 2219  PROCALCITON <0.10    Recent Results (from the past 240 hour(s))  Resp Panel by RT-PCR (Flu A&B, Covid) Nasopharyngeal Swab     Status: None   Collection Time: 03/23/21 12:42 AM   Specimen: Nasopharyngeal Swab; Nasopharyngeal(NP) swabs in vial transport medium  Result Value Ref Range Status   SARS Coronavirus 2 by RT PCR NEGATIVE NEGATIVE Final  Comment: (NOTE) SARS-CoV-2 target nucleic acids are NOT DETECTED.  The SARS-CoV-2 RNA is generally detectable in upper respiratory specimens during the acute phase of infection. The lowest concentration of SARS-CoV-2 viral copies this assay can detect is 138 copies/mL. A negative result does not preclude SARS-Cov-2 infection and should not be used as the sole basis for treatment or other patient management decisions. A negative result may occur with  improper specimen collection/handling, submission of specimen other than nasopharyngeal swab, presence of viral mutation(s) within the areas targeted by this assay, and inadequate number of viral copies(<138 copies/mL). A negative result must be combined with clinical observations, patient history, and epidemiological information. The expected result is Negative.  Fact Sheet for Patients:  EntrepreneurPulse.com.au  Fact Sheet for Healthcare Providers:  IncredibleEmployment.be  This test is no t yet approved or cleared by the [place] FDA and  has been authorized for detection and/or diagnosis of SARS-CoV-2 by FDA under an Emergency Use Authorization (EUA).  This EUA will remain  in effect (meaning this test can be used) for the duration of the COVID-19 declaration under Section 564(b)(1) of the Act, 21 U.S.C.section 360bbb-3(b)(1), unless the authorization is terminated  or revoked sooner.       Influenza A by PCR NEGATIVE NEGATIVE Final   Influenza B by PCR NEGATIVE NEGATIVE Final    Comment: (NOTE) The Xpert Xpress SARS-CoV-2/FLU/RSV plus assay is intended as an aid in the diagnosis of influenza from Nasopharyngeal swab specimens and should not be used as a sole basis for treatment. Nasal washings and aspirates are unacceptable for Xpert Xpress SARS-CoV-2/FLU/RSV testing.  Fact Sheet for Patients: EntrepreneurPulse.com.au  Fact Sheet for Healthcare Providers: IncredibleEmployment.be  This test is not yet approved or cleared by the [place] FDA and has been authorized for detection and/or diagnosis of SARS-CoV-2 by FDA under an Emergency Use Authorization (EUA). This EUA will remain in effect (meaning this test can be used) for the duration of the COVID-19 declaration under Section 564(b)(1) of the Act, 21 U.S.C. section 360bbb-3(b)(1), unless the authorization is terminated or revoked.  Performed at [facility], [address] [place], [place] [address]   Resp Panel by RT-PCR (Flu A&B, Covid) Nasopharyngeal Swab     Status: None   Collection Time: 03/26/21  5:26 PM   Specimen: Nasopharyngeal Swab; Nasopharyngeal(NP) swabs in vial transport medium  Result Value Ref Range Status   SARS Coronavirus 2 by RT PCR NEGATIVE NEGATIVE Final    Comment: (NOTE) SARS-CoV-2 target nucleic acids are NOT DETECTED.  The SARS-CoV-2 RNA is generally detectable in upper respiratory specimens during the acute phase of infection. The lowest concentration of SARS-CoV-2 viral copies this assay can detect is 138 copies/mL. A negative result does not preclude SARS-Cov-2 infection and should not  be used as the sole basis for treatment or other patient management decisions. A negative result may occur with  improper specimen collection/handling, submission of specimen other than nasopharyngeal swab, presence of viral mutation(s) within the areas targeted by this assay, and inadequate number of viral copies(<138 copies/mL). A negative result must be combined with clinical observations, patient history, and epidemiological information. The expected result is Negative.  Fact Sheet for Patients:  EntrepreneurPulse.com.au  Fact Sheet for Healthcare Providers:  IncredibleEmployment.be  This test is no t yet approved or cleared by the [place] FDA and  has been authorized for detection and/or diagnosis of SARS-CoV-2 by FDA under an Emergency Use Authorization (EUA). This EUA will remain  in effect (  meaning this test can be used) for the duration of the COVID-19 declaration under Section 564(b)(1) of the Act, 21 U.S.C.section 360bbb-3(b)(1), unless the authorization is terminated  or revoked sooner.       Influenza A by PCR NEGATIVE NEGATIVE Final   Influenza B by PCR NEGATIVE NEGATIVE Final    Comment: (NOTE) The Xpert Xpress SARS-CoV-2/FLU/RSV plus assay is intended as an aid in the diagnosis of influenza from Nasopharyngeal swab specimens and should not be used as a sole basis for treatment. Nasal washings and aspirates are unacceptable for Xpert Xpress SARS-CoV-2/FLU/RSV testing.  Fact Sheet for Patients: EntrepreneurPulse.com.au  Fact Sheet for Healthcare Providers: IncredibleEmployment.be  This test is not yet approved or cleared by the [place] FDA and has been authorized for detection and/or diagnosis of SARS-CoV-2 by FDA under an Emergency Use Authorization (EUA). This EUA will remain in effect (meaning this test can be used) for the duration of the COVID-19 declaration under Section  564(b)(1) of the Act, 21 U.S.C. section 360bbb-3(b)(1), unless the authorization is terminated or revoked.  Performed at [facility], [address] [place], [place] [address]   Blood culture (routine x 2)     Status: None   Collection Time: 03/26/21  9:09 PM   Specimen: BLOOD  Result Value Ref Range Status   Specimen Description BLOOD RIGHT ANTECUBITAL  Final   Special Requests   Final    BOTTLES DRAWN AEROBIC AND ANAEROBIC Blood Culture adequate volume   Culture   Final    NO GROWTH 5 DAYS Performed at [facility], [address] [place], [place] [address]    Report Status 03/31/2021 FINAL  Final  Blood culture (routine x 2)     Status: None   Collection Time: 03/26/21 10:18 PM   Specimen: BLOOD  Result Value Ref Range Status   Specimen Description BLOOD BLOOD RIGHT HAND  Final   Special Requests   Final    BOTTLES DRAWN AEROBIC AND ANAEROBIC Blood Culture results may not be optimal due to an inadequate volume of blood received in culture bottles   Culture   Final    NO GROWTH 5 DAYS Performed at [facility], [address] [place], [place] [address]    Report Status 03/31/2021 FINAL  Final  MRSA PCR Screening     Status: None   Collection Time: 03/26/21 10:56 PM   Specimen: Nasal Mucosa; Nasopharyngeal  Result Value Ref Range Status   MRSA by PCR NEGATIVE NEGATIVE Final    Comment:        The GeneXpert MRSA Assay (FDA approved for NASAL specimens only), is one component of a comprehensive MRSA colonization surveillance program. It is not intended to diagnose MRSA infection nor to guide or monitor treatment for MRSA infections. Performed at [facility], [address] [place], [place] [address]   Culture, fungus without smear     Status: None (Preliminary result)   Collection Time: 03/27/21  2:43 PM   Specimen: CSF; Cerebrospinal Fluid  Result Value Ref Range Status   Specimen Description   Final    CSF Performed  at [facility], [address], [place], [place] [address]    Special Requests   Final    Immunocompromised Performed at [facility], [address] [place],  [address]    Culture   Final    NO FUNGUS ISOLATED AFTER 3 DAYS Performed at [facility], [address] [place],  [place] [address]    Report Status PENDING  Incomplete  CSF culture w Gram Stain     Status: None   Collection Time: 03/27/21  2:43 PM   Specimen: PATH Cytology CSF; Cerebrospinal Fluid  Result Value Ref Range Status   Specimen Description   Final    CSF Performed at [facility], [address] [place], [place] [address]    Special Requests   Final    NONE Performed at [facility], [address] [place], [place] [address]    Gram Stain   Final    WBC SEEN RBC SEEN NO ORGANISMS SEEN Performed at [facility], [address], [place], [place] [address]    Culture   Final    NO GROWTH 3 DAYS Performed at [facility], [place] [address] [place], [place] [address]    Report Status 03/30/2021 FINAL  Final  Anaerobic culture w Gram Stain     Status: None (Preliminary result)   Collection Time: 03/27/21  2:43 PM   Specimen: PATH Cytology CSF; Cerebrospinal Fluid  Result Value Ref Range Status   Specimen Description   Final    CSF Performed at [facility], [address], [place], [place] [address]    Special Requests   Final    NONE Performed at [facility], [address] [place], [place] [address]    Gram Stain   Final    NO WBC SEEN NO ORGANISMS SEEN Performed at [facility], [place] [address], [place], [place] [address]    Culture   Final    NO ANAEROBES ISOLATED; CULTURE IN PROGRESS FOR 5 DAYS   Report Status PENDING  Incomplete         Radiology Studies: No results found.      Scheduled Meds: . vitamin C  500 mg Oral Daily  . aspirin  81 mg Oral Daily  . chlorhexidine  15 mL  Mouth Rinse BID  . Chlorhexidine Gluconate Cloth  6 each Topical Daily  . cholecalciferol  1,000 Units Oral Daily  . divalproex  250 mg Oral Q12H  . enoxaparin (LOVENOX) injection  0.5 mg/kg Subcutaneous Q24H  . famotidine  20 mg Oral QHS  . feeding supplement  237 mL Oral TID BM  . ferrous sulfate  325 mg Oral Q breakfast  . insulin aspart  0-15 Units Subcutaneous Q4H  . lidocaine (PF)  5 mL Other Once  . losartan  100 mg Oral Daily  . mouth rinse  15 mL Mouth Rinse q12n4p  . multivitamin with minerals  1 tablet Oral Daily  . risperiDONE  2 mg Oral QHS  . rosuvastatin  5 mg Oral Daily   Continuous Infusions:    LOS: 6 days    Time spent: 15 minutes    Sidney Ace, MD [facility] Pager [phone]  If 7PM-7AM, please contact night-coverage 04/01/2021, 3:02 PM

## 2021-04-02 DIAGNOSIS — F29 Unspecified psychosis not due to a substance or known physiological condition: Secondary | ICD-10-CM | POA: Diagnosis not present

## 2021-04-02 LAB — CBC WITH DIFFERENTIAL/PLATELET
Abs Immature Granulocytes: 0.56 10*3/uL — ABNORMAL HIGH (ref 0.00–0.07)
Basophils Absolute: 0.1 10*3/uL (ref 0.0–0.1)
Basophils Relative: 0 %
Eosinophils Absolute: 0.6 10*3/uL — ABNORMAL HIGH (ref 0.0–0.5)
Eosinophils Relative: 3 %
HCT: 36.6 % (ref 36.0–46.0)
Hemoglobin: 11.7 g/dL — ABNORMAL LOW (ref 12.0–15.0)
Immature Granulocytes: 3 %
Lymphocytes Relative: 17 %
Lymphs Abs: 3.6 10*3/uL (ref 0.7–4.0)
MCH: 27.7 pg (ref 26.0–34.0)
MCHC: 32 g/dL (ref 30.0–36.0)
MCV: 86.5 fL (ref 80.0–100.0)
Monocytes Absolute: 1.3 10*3/uL — ABNORMAL HIGH (ref 0.1–1.0)
Monocytes Relative: 6 %
Neutro Abs: 15.3 10*3/uL — ABNORMAL HIGH (ref 1.7–7.7)
Neutrophils Relative %: 71 %
Platelets: 239 10*3/uL (ref 150–400)
RBC: 4.23 MIL/uL (ref 3.87–5.11)
RDW: 15.6 % — ABNORMAL HIGH (ref 11.5–15.5)
Smear Review: NORMAL
WBC: 21.4 10*3/uL — ABNORMAL HIGH (ref 4.0–10.5)
nRBC: 0 % (ref 0.0–0.2)

## 2021-04-02 LAB — BASIC METABOLIC PANEL
Anion gap: 10 (ref 5–15)
BUN: 14 mg/dL (ref 8–23)
CO2: 26 mmol/L (ref 22–32)
Calcium: 8.9 mg/dL (ref 8.9–10.3)
Chloride: 106 mmol/L (ref 98–111)
Creatinine, Ser: 0.89 mg/dL (ref 0.44–1.00)
GFR, Estimated: >60 mL/min (ref >60)
Glucose, Bld: 107 mg/dL — ABNORMAL HIGH (ref 70–99)
Potassium: 3.4 mmol/L — ABNORMAL LOW (ref 3.5–5.1)
Sodium: 142 mmol/L (ref 135–145)

## 2021-04-02 LAB — GLUCOSE, CAPILLARY
Glucose-Capillary: 102 mg/dL — ABNORMAL HIGH (ref 70–99)
Glucose-Capillary: 120 mg/dL — ABNORMAL HIGH (ref 70–99)
Glucose-Capillary: 129 mg/dL — ABNORMAL HIGH (ref 70–99)
Glucose-Capillary: 60 mg/dL — ABNORMAL LOW (ref 70–99)
Glucose-Capillary: 76 mg/dL (ref 70–99)
Glucose-Capillary: 91 mg/dL (ref 70–99)
Glucose-Capillary: 96 mg/dL (ref 70–99)

## 2021-04-02 LAB — MAGNESIUM: Magnesium: 2.1 mg/dL (ref 1.7–2.4)

## 2021-04-02 LAB — ANAEROBIC CULTURE W GRAM STAIN: Gram Stain: NONE SEEN

## 2021-04-02 MED ORDER — HALOPERIDOL LACTATE 5 MG/ML IJ SOLN
1.0000 mg | Freq: Four times a day (QID) | INTRAMUSCULAR | Status: DC | PRN
  Administered 2021-04-02 – 2021-04-03 (×3): 1 mg via INTRAVENOUS
  Filled 2021-04-02 (×3): qty 1

## 2021-04-02 MED ORDER — INSULIN ASPART 100 UNIT/ML ~~LOC~~ SOLN: 0.0000 [IU] | Freq: Three times a day (TID) | SUBCUTANEOUS | Status: DC

## 2021-04-02 MED ORDER — HALOPERIDOL LACTATE 5 MG/ML IJ SOLN
2.5000 mg | Freq: Once | INTRAMUSCULAR | Status: AC
  Administered 2021-04-02: 03:00:00 2.5 mg via INTRAVENOUS
  Filled 2021-04-02: qty 1

## 2021-04-02 NOTE — Progress Notes (Signed)
   04/02/21 0346  Assess: MEWS Score  Temp 98.4 F (36.9 C)  BP 105/63  Pulse Rate (!) 116  Resp 18  SpO2 98 %  O2 Device Room Air  Assess: MEWS Score  MEWS Temp 0  MEWS Systolic 0  MEWS Pulse 2  MEWS RR 0  MEWS LOC 0  MEWS Score 2  MEWS Score Color Yellow  Assess: if the MEWS score is Yellow or Red  Were vital signs taken at a resting state? Yes  Focused Assessment No change from prior assessment  Early Detection of Sepsis Score *See Row Information* Low  MEWS guidelines implemented *See Row Information* Yes  Treat  MEWS Interventions Escalated (See documentation below)  Take Vital Signs  Increase Vital Sign Frequency  Yellow: Q 2hr X 2 then Q 4hr X 2, if remains yellow, continue Q 4hrs  Escalate  MEWS: Escalate Yellow: discuss with charge nurse/RN and consider discussing with provider and RRT  Notify: Charge Nurse/RN  Name of Charge Nurse/RN Notified [place], RN  Date Charge Nurse/RN Notified 04/02/21  Time Charge Nurse/RN Notified 0400  Notify: Provider  Provider Name/Title Sharion Settler, NP  Date Provider Notified 04/02/21  Time Provider Notified [phone]  Notification Type Page  Notification Reason Other (Comment) (no PRNs)  Provider response No new orders  Date of Provider Response 04/02/21  Time of Provider Response 0500  Document  Progress note created (see row info) Yes

## 2021-04-02 NOTE — Progress Notes (Signed)
patient fall this morning about 0830. NT heard the bed alarm and went to room but patient was already standing by the bed, then she watched patient drop to one knee (right) then tumbled slowly to floor onto her right side. Patient had a bowel movement during the process. Post Fall Assessment done, patient is alert but with confusion and paranoia which is what night shift reported to me. VS are: BP-132/75, HR-100, RR-16, O2-100 rm air. Patient did not hit her head. am in the process of doing post fall huddle. Plan is to move patient to room closer to nurses station. I spoke with sister and son concerning fall and plan. MD notified. Post fall assessment done, post fall huddle done. Safety report done

## 2021-04-02 NOTE — Procedures (Addendum)
[facility] EEG  HISTORY:  65 year old female presents with altered mental status.  INTRODUCTION:  A digital EEG was performed in the laboratory using the standard international 10/20 system of electrode placement in addition to one channel of EKG monitoring.  Hyperventilation was not performed. Photic Stimulation was performed.  This tracing captures wakefulness through drowsiness. This EEG was recording for about 20 minutes.  DESCRIPTION OF RECORD:  In the most alert state the alpha rhythm is 9 Hz in frequency, which is seen in the occipital region and attenuates with eye opening and is bilaterally synchronous and symmetrical.  No focal slowing, sharp waves, or epileptiform discharges are seen.  Photic stimulation was performed and did not produce any abnormalities.  Heart rate was irregular at a rate of 120 bpm.  IMPRESSION:  This is a normal adult EEG in the awake and drowsy states. No focal slowing, focal abnormalities, epileptiform discharges, or electrographic seizures are seen.  Of note, a normal EEG does not exclude the diagnosis of seizure disorder.  A repeat sleep deprived EEG or 24 hour EEG could be considered.  The abnormality on single lead EKG could be further evaluated by 12 lead EKG.  Clinical correlation is recommended.

## 2021-04-02 NOTE — Clinical Social Work Note (Signed)
This CSW working remote today. Tried calling patient in room but no answer. Left voicemail for sister. Will discuss SNF recommendation when she calls back.  Dayton Scrape, [place]

## 2021-04-02 NOTE — TOC Initial Note (Signed)
Transition of Care [facility]) - Initial/Assessment Note    Patient Details  Name: Megan Oneal MRN: [id number] Date of Birth: 29-May-1956  Transition of Care [facility]) CM/SW Contact:    Candie Chroman, LCSW Phone Number: 04/02/2021, 3:05 PM  Clinical Narrative:  Received call back from patient's sister. CSW introduced role and explained that PT recommendations would be discussed. Patient's sister is agreeable to SNF placement. Will follow up when bed offers are available. No further concerns. CSW encouraged patient's sister to contact CSW as needed. CSW will continue to follow patient and her sister for support and facilitate discharge to SNF once medically stable.                Expected Discharge Plan: Skilled Nursing Facility Barriers to Discharge: Insurance Authorization,Continued Medical Work up,Requiring sitter/restraints   Patient Goals and CMS Choice   CMS Medicare.gov Compare Post Acute Care list provided to:: Patient Represenative (must comment) (Told sister how to access online.)    Expected Discharge Plan and Services Expected Discharge Plan: [place] Acute Care Choice: [place] Living arrangements for the past 2 months: Single Family Home                                      Prior Living Arrangements/Services Living arrangements for the past 2 months: Single Family Home Lives with:: Self Patient language and need for interpreter reviewed:: Yes Do you feel safe going back to the place where you live?: Yes      Need for Family Participation in Patient Care: Yes (Comment)     Criminal Activity/Legal Involvement Pertinent to Current Situation/Hospitalization: No - Comment as needed  Activities of Daily Living Home Assistive Devices/Equipment: None ADL Screening (condition at time of admission) Patient's cognitive ability adequate to safely complete daily activities?: Yes Is the patient deaf or have difficulty hearing?:  No Does the patient have difficulty seeing, even when wearing glasses/contacts?: No Does the patient have difficulty concentrating, remembering, or making decisions?: No Patient able to express need for assistance with ADLs?: Yes Does the patient have difficulty dressing or bathing?: No Independently performs ADLs?: Yes (appropriate for developmental age) Does the patient have difficulty walking or climbing stairs?: No Weakness of Legs: None Weakness of Arms/Hands: None  Permission Sought/Granted Permission sought to share information with : Facility Contact Representative,Family Supports    Share Information with NAME: Charlann Noss  Permission granted to share info w AGENCY: SNF's  Permission granted to share info w Relationship: Sister  Permission granted to share info w Contact Information: [phone]  Emotional Assessment Appearance:: Appears stated age Attitude/Demeanor/Rapport: Unable to Assess Affect (typically observed): Unable to Assess Orientation: : Fluctuating Orientation (Suspected and/or reported Sundowners) Alcohol / Substance Use: Not Applicable Psych Involvement: No (comment)  Admission diagnosis:  Acute encephalopathy [G93.40] Altered mental status, unspecified altered mental status type [R41.82] Patient Active Problem List   Diagnosis Date Noted  . Psychosis ([place]) 04/02/2021  . Acute encephalopathy 03/26/2021  . Auditory hallucinations 03/24/2021  . Otosclerosis 03/24/2021  . Encounter for screening colonoscopy 02/15/2019  . Obesity 03/12/2013  . Anxiety 03/12/2013  . Laboratory test 03/12/2013  . Frequent PVCs   . Degenerative disc disease, lumbar   . Hypertension   . Endometriosis   . Vascular bruit 01/21/2013  . Palpitations 10/04/2010   PCP:  Sharilyn Sites, MD Pharmacy:   [id number] -  [place], [place] [address] [place] [address] Phone: [phone] Fax: [phone]     Social  Determinants of Health (SDOH) Interventions    Readmission Risk Interventions No flowsheet data found.

## 2021-04-02 NOTE — NC FL2 (Signed)
[place] LEVEL OF CARE SCREENING TOOL     IDENTIFICATION  Patient Name: Megan Oneal Birthdate: 1956-05-27 Sex: female Admission Date (Current Location): 03/26/2021  [place] and [place] Number:  Engineering geologist and Address:  [facility], [address] [place], [place] [address]      Provider Number: [id number]  Attending Physician Name and Address:  Nita Sells, MD  Relative Name and Phone Number:       Current Level of Care: Hospital Recommended Level of Care: [place] Prior Approval Number:    Date Approved/Denied:   PASRR Number: [id number]  Discharge Plan: SNF    Current Diagnoses: Patient Active Problem List   Diagnosis Date Noted  . Acute encephalopathy 03/26/2021  . Auditory hallucinations 03/24/2021  . Otosclerosis 03/24/2021  . Encounter for screening colonoscopy 02/15/2019  . Obesity 03/12/2013  . Anxiety 03/12/2013  . Laboratory test 03/12/2013  . Frequent PVCs   . Degenerative disc disease, lumbar   . Hypertension   . Endometriosis   . Vascular bruit 01/21/2013  . Palpitations 10/04/2010    Orientation RESPIRATION BLADDER Height & Weight     Self,Time,Situation,Place (Notes document some confusion.)  Normal Continent,Indwelling catheter Weight: (!) 308 lb 13.8 oz (140.1 kg) Height:  5' 5.98" (167.6 cm)  BEHAVIORAL SYMPTOMS/MOOD NEUROLOGICAL BOWEL NUTRITION STATUS  Other (Comment) (Confusion, agitation, tries to get out of bed, pulls lines, etc. Not combative per RN.)  (None) Continent Diet (Heart healthy/carb modified)  AMBULATORY STATUS COMMUNICATION OF NEEDS Skin   Limited Assist Verbally Normal                       Personal Care Assistance Level of Assistance  Bathing,Feeding,Dressing Bathing Assistance: Limited assistance Feeding assistance: Limited assistance Dressing Assistance: Limited assistance     Functional Limitations Info  Sight,Hearing,Speech Sight  Info: Adequate Hearing Info: Adequate Speech Info: Adequate    SPECIAL CARE FACTORS FREQUENCY  PT (By licensed PT),OT (By licensed OT)     PT Frequency: 5 x week OT Frequency: 5 x week            Contractures Contractures Info: Not present    Additional Factors Info  Code Status,Allergies Code Status Info: Full code Allergies Info: Metoprolol, Sulfa Antibiotics           Current Medications (04/02/2021):  This is the current hospital active medication list Current Facility-Administered Medications  Medication Dose Route Frequency Provider Last Rate Last Admin  . aspirin chewable tablet 81 mg  81 mg Oral Daily Tyler Pita, MD   81 mg at 04/02/21 1236  . chlorhexidine (PERIDEX) 0.12 % solution 15 mL  15 mL Mouth Rinse BID Tyler Pita, MD   15 mL at 04/02/21 1236  . Chlorhexidine Gluconate Cloth 2 % PADS 6 each  6 each Topical Daily Flora Lipps, MD   6 each at 04/02/21 1237  . divalproex (DEPAKOTE) DR tablet 250 mg  250 mg Oral Q12H Lang Snow, NP   250 mg at 04/02/21 1236  . enoxaparin (LOVENOX) injection 72.5 mg  0.5 mg/kg Subcutaneous Q24H Flora Lipps, MD   72.5 mg at 04/02/21 1237  . famotidine (PEPCID) tablet 20 mg  20 mg Oral QHS Dallie Piles, RPH   20 mg at 04/01/21 2147  . feeding supplement (ENSURE ENLIVE / ENSURE PLUS) liquid 237 mL  237 mL Oral TID BM Sreenath, Sudheer B, MD   237 mL at  04/02/21 1000  . hydrALAZINE (APRESOLINE) injection 10-20 mg  10-20 mg Intravenous Q4H PRN Awilda Bill, NP   10 mg at 03/29/21 1626  . ibuprofen (ADVIL) tablet 600 mg  600 mg Oral Q6H PRN Rust-Chester, Britton L, NP   600 mg at 04/01/21 2331  . insulin aspart (novoLOG) injection 0-6 Units  0-6 Units Subcutaneous TID WC Samtani, Jai-Gurmukh, MD      . lidocaine (PF) (XYLOCAINE) 1 % injection 5 mL  5 mL Other Once Awilda Bill, NP      . losartan (COZAAR) tablet 100 mg  100 mg Oral Daily Tyler Pita, MD   100 mg at 04/02/21 1236  . MEDLINE  mouth rinse  15 mL Mouth Rinse q12n4p Tyler Pita, MD   15 mL at 04/02/21 1237  . ondansetron (ZOFRAN) injection 4 mg  4 mg Intravenous Q6H PRN Awilda Bill, NP      . polyethylene glycol (MIRALAX / GLYCOLAX) packet 17 g  17 g Oral Daily PRN Tyler Pita, MD      . risperiDONE (RISPERDAL) tablet 2 mg  2 mg Oral QHS Salley Scarlet, MD   2 mg at 04/01/21 2146     Discharge Medications: Please see discharge summary for a list of discharge medications.  Relevant Imaging Results:  Relevant Lab Results:   Additional Information SS#: [id number]. Per sister, fully vaccinated with booster. Currently with sitter due to confusion. Not combative. Will discontinue prior to discharge.  Candie Chroman, LCSW

## 2021-04-02 NOTE — Consult Note (Signed)
Megan Psychiatry Consult   Reason for Consult: Consult for 65 year old woman who is now recovering from an ICU stay after being brought in unresponsive.  Reestablish psychiatric evaluation Referring Physician:  Samtani Patient Identification: Megan Oneal MRN:  [id number] Principal Diagnosis: Psychosis [facility]) Diagnosis:  Principal Problem:   Psychosis ([place]) Active Problems:   Acute encephalopathy   Total Time spent with patient: 1 hour  Subjective:   Megan Oneal is a 65 y.o. female patient admitted with "I want to get these voices off of me".  HPI: Patient seen chart reviewed.  Also spoke with the patient's consent with her son who was present.  Also had previously interacted with the patient in the emergency room.  65 year old woman presented to the emergency room on April 4 with complaints of auditory hallucinations accompanied by some anxiety.  Diagnosis was unclear.  Patient was started on low-dose Risperdal.  Within a couple days she came into the hospital after being found unresponsive at home.  Full work-up was done and at this point still the most likely cause appears to be overdose of cyclobenzaprine.  Patient has had extended stay including time in the intensive care unit during which she was intubated for a time and also given doses of high potency steroids.  On interview today I found the patient awake and cooperative.  She did require frequent redirection as she would go off on tangents about the voices or about aspects of her life that have upset her such as the death of her mother 3 years ago.  With some redirection I was able to ascertain that the patient feels that the hallucinations have improved greatly over the last few days.  They are still present but are only a little whispering and not constantly there.  Her mood remains anxious.  She still has spells of anxiety especially at night.  She denies having any suicidal thoughts at all.  She admits to me that she had  taken the Flexeril and claims that she did it to try to treat the voices.  Outside of the room the son tells me that he had told her that the voices told her to take the medicine.  Medically the patient seems to be improving.  Eating a little more.  Getting up and walking some.  Still running high white counts.  Reviewed work-up.  Full neurology work-up did not determine any specific cause.  So far no evidence of seizures and no definite infection.  Patient was oriented insofar as being able to recognize her son and tell me the date accurately but was not able to really explain why she was in the hospital.  Also worth noting that while the patient has been told that she had stenosis in her ear canals and may be gradually losing her hearing it was actually quite easy to communicate with her.  I kept a mask on the whole time illuminating any lipreading and yet she was able to understand me fine without my shouting at her.  Past Psychiatric History: No clear past psychiatric history although it seems like the symptoms of the hallucinations have probably been building up over several months.  The son has some thoughts that the patient suffered a severe emotional blow when her mother passed away about 3 years ago and then was badly isolated by the pandemic which probably contributed to a worsening mental state.  Risk to Self:   Risk to Others:   Prior Inpatient Therapy:   Prior  Outpatient Therapy:    Past Medical History:  Past Medical History:  Diagnosis Date  . Anxiety 03/12/2013  . Degenerative disc disease, lumbar    Epidural injections in 2007  . Diabetes mellitus without complication ([place])   . Endometriosis   . Frequent PVCs    12,000/24 hours  . Hypertension   . Otalgia, unspecified   . Palpitations 10/04/2010  . Vascular bruit 01/2013   Right neck    Past Surgical History:  Procedure Laterality Date  . BREAST SURGERY     benign tumor excised-age 72  . CARDIOVASCULAR STRESS TEST   03/13/2008   No scintigraphic evidence of inducible myocardial ischemia, EKG negative for ischemia, no ECG changes.  . COLONOSCOPY  2009  . COLONOSCOPY N/A 09/21/2019   Procedure: COLONOSCOPY;  Surgeon: Rogene Houston, MD;  Location: [facility];  Service: Endoscopy;  Laterality: N/A;  2:00-10:30  . CYST EXCISION  2007   Scalp  . DOPPLER ECHOCARDIOGRAPHY  03/13/2008   EF >84%, LV systolic function is normal, LV wall function is normal  . LAPAROSCOPIC CHOLECYSTECTOMY  2010  . LUMBAR LAMINECTOMY  2009    L4-L5 and L5-S1 laminectomy with diskectomy; transforaminal  . POLYPECTOMY  09/21/2019   Procedure: POLYPECTOMY;  Surgeon: Rogene Houston, MD;  Location: [facility];  Service: Endoscopy;;  . REPLACEMENT TOTAL KNEE    . SLEEP STUDY  10/29/2009   AHI-1.8/hr, during REM 6.4/hr; RDI-8.0/hr, during REM 15.0/hr; avg oxygen during REM and NREM was 94%  . TOTAL KNEE ARTHROPLASTY Bilateral   . TUBAL LIGATION     Family History:  Family History  Problem Relation Age of Onset  . Diabetes Mother   . Hypertension Mother   . Diabetes Brother   . Hypertension Brother   . Hypertension Father    Family Psychiatric  History: Oneal reported Social History:  Social History   Substance and Sexual Activity  Alcohol Use No     Social History   Substance and Sexual Activity  Drug Use No    Social History   Socioeconomic History  . Marital status: Divorced    Spouse name: Not on file  . Number of children: 3  . Years of education: Not on file  . Highest education level: Not on file  Occupational History  . Not on file  Tobacco Use  . Smoking status: Former Smoker    Packs/day: 2.00    Years: 20.00    Pack years: 40.00    Types: Cigarettes    Start date: 08/03/1972    Quit date: 08/03/1996    Years since quitting: 24.6  . Smokeless tobacco: Former Systems developer    Quit date: 12/21/1994  Substance and Sexual Activity  . Alcohol use: No  . Drug use: No  . Sexual activity: Not Currently     Birth control/protection: Post-menopausal  Other Topics Concern  . Not on file  Social History Narrative  . Not on file   Social Determinants of Health   Financial Resource Strain: Not on file  Food Insecurity: Not on file  Transportation Needs: Not on file  Physical Activity: Not on file  Stress: Not on file  Social Connections: Not on file   Additional Social History:    Allergies:   Allergies  Allergen Reactions  . Metoprolol Other (See Comments)    Fatigue and malaise; wheezing  . Sulfa Antibiotics     Labs:  Results for orders placed or performed during the hospital encounter of  03/26/21 (from the past 48 hour(s))  Glucose, capillary     Status: Abnormal   Collection Time: 03/31/21  4:24 PM  Result Value Ref Range   Glucose-Capillary 115 (H) 70 - 99 mg/dL    Comment: Glucose reference range applies only to samples taken after fasting for at least 8 hours.  Glucose, capillary     Status: Oneal   Collection Time: 03/31/21  7:33 PM  Result Value Ref Range   Glucose-Capillary 99 70 - 99 mg/dL    Comment: Glucose reference range applies only to samples taken after fasting for at least 8 hours.  Glucose, capillary     Status: Abnormal   Collection Time: 03/31/21 11:07 PM  Result Value Ref Range   Glucose-Capillary 106 (H) 70 - 99 mg/dL    Comment: Glucose reference range applies only to samples taken after fasting for at least 8 hours.  Glucose, capillary     Status: Abnormal   Collection Time: 04/01/21  4:09 AM  Result Value Ref Range   Glucose-Capillary 114 (H) 70 - 99 mg/dL    Comment: Glucose reference range applies only to samples taken after fasting for at least 8 hours.  Glucose, capillary     Status: Abnormal   Collection Time: 04/01/21  7:39 AM  Result Value Ref Range   Glucose-Capillary 114 (H) 70 - 99 mg/dL    Comment: Glucose reference range applies only to samples taken after fasting for at least 8 hours.  Glucose, capillary     Status: Abnormal    Collection Time: 04/01/21 11:28 AM  Result Value Ref Range   Glucose-Capillary 132 (H) 70 - 99 mg/dL    Comment: Glucose reference range applies only to samples taken after fasting for at least 8 hours.  Glucose, capillary     Status: Abnormal   Collection Time: 04/01/21  4:24 PM  Result Value Ref Range   Glucose-Capillary 128 (H) 70 - 99 mg/dL    Comment: Glucose reference range applies only to samples taken after fasting for at least 8 hours.  Glucose, capillary     Status: Abnormal   Collection Time: 04/01/21  8:40 PM  Result Value Ref Range   Glucose-Capillary 105 (H) 70 - 99 mg/dL    Comment: Glucose reference range applies only to samples taken after fasting for at least 8 hours.  Glucose, capillary     Status: Oneal   Collection Time: 04/02/21 12:13 AM  Result Value Ref Range   Glucose-Capillary 96 70 - 99 mg/dL    Comment: Glucose reference range applies only to samples taken after fasting for at least 8 hours.  Glucose, capillary     Status: Abnormal   Collection Time: 04/02/21  3:49 AM  Result Value Ref Range   Glucose-Capillary 60 (L) 70 - 99 mg/dL    Comment: Glucose reference range applies only to samples taken after fasting for at least 8 hours.  CBC with Differential/Platelet     Status: Abnormal   Collection Time: 04/02/21  4:25 AM  Result Value Ref Range   WBC 21.4 (H) 4.0 - 10.5 K/uL   RBC 4.23 3.87 - 5.11 MIL/uL   Hemoglobin 11.7 (L) 12.0 - 15.0 g/dL   HCT 36.6 36.0 - 46.0 %   MCV 86.5 80.0 - 100.0 fL   MCH 27.7 26.0 - 34.0 pg   MCHC 32.0 30.0 - 36.0 g/dL   RDW 15.6 (H) 11.5 - 15.5 %   Platelets 239 150 - 400 K/uL  nRBC 0.0 0.0 - 0.2 %   Neutrophils Relative % 71 %   Neutro Abs 15.3 (H) 1.7 - 7.7 K/uL   Lymphocytes Relative 17 %   Lymphs Abs 3.6 0.7 - 4.0 K/uL   Monocytes Relative 6 %   Monocytes Absolute 1.3 (H) 0.1 - 1.0 K/uL   Eosinophils Relative 3 %   Eosinophils Absolute 0.6 (H) 0.0 - 0.5 K/uL   Basophils Relative 0 %   Basophils Absolute 0.1 0.0  - 0.1 K/uL   WBC Morphology MILD LEFT SHIFT (1-5% METAS, OCC MYELO, OCC BANDS)    RBC Morphology MORPHOLOGY UNREMARKABLE    Smear Review Normal platelet morphology    Immature Granulocytes 3 %   Abs Immature Granulocytes 0.56 (H) 0.00 - 0.07 K/uL    Comment: Performed at [facility], [address], [place], [place] [address]  Basic metabolic panel     Status: Abnormal   Collection Time: 04/02/21  4:25 AM  Result Value Ref Range   Sodium 142 135 - 145 mmol/L   Potassium 3.4 (L) 3.5 - 5.1 mmol/L   Chloride 106 98 - 111 mmol/L   CO2 26 22 - 32 mmol/L   Glucose, Bld 107 (H) 70 - 99 mg/dL    Comment: Glucose reference range applies only to samples taken after fasting for at least 8 hours.   BUN 14 8 - 23 mg/dL   Creatinine, Ser 0.89 0.44 - 1.00 mg/dL   Calcium 8.9 8.9 - 10.3 mg/dL   GFR, Estimated >60 >60 mL/min    Comment: (NOTE) Calculated using the CKD-EPI Creatinine Equation (2021)    Anion gap 10 5 - 15    Comment: Performed at [facility], [address] [place], [place] [address]  Magnesium     Status: Oneal   Collection Time: 04/02/21  4:25 AM  Result Value Ref Range   Magnesium 2.1 1.7 - 2.4 mg/dL    Comment: Performed at [facility], [address] [place], [place] [address]  Glucose, capillary     Status: Abnormal   Collection Time: 04/02/21  5:25 AM  Result Value Ref Range   Glucose-Capillary 129 (H) 70 - 99 mg/dL    Comment: Glucose reference range applies only to samples taken after fasting for at least 8 hours.  Glucose, capillary     Status: Abnormal   Collection Time: 04/02/21  8:24 AM  Result Value Ref Range   Glucose-Capillary 102 (H) 70 - 99 mg/dL    Comment: Glucose reference range applies only to samples taken after fasting for at least 8 hours.  Glucose, capillary     Status: Abnormal   Collection Time: 04/02/21 12:40 PM  Result Value Ref Range   Glucose-Capillary 120 (H) 70 - 99 mg/dL    Comment: Glucose reference range  applies only to samples taken after fasting for at least 8 hours.    Current Facility-Administered Medications  Medication Dose Route Frequency Provider Last Rate Last Admin  . aspirin chewable tablet 81 mg  81 mg Oral Daily Tyler Pita, MD   81 mg at 04/02/21 1236  . chlorhexidine (PERIDEX) 0.12 % solution 15 mL  15 mL Mouth Rinse BID Tyler Pita, MD   15 mL at 04/02/21 1236  . Chlorhexidine Gluconate Cloth 2 % PADS 6 each  6 each Topical Daily Flora Lipps, MD   6 each at 04/02/21 1237  . enoxaparin (LOVENOX) injection 72.5 mg  0.5 mg/kg Subcutaneous Q24H Kasa,  Maretta Bees, MD   72.5 mg at 04/02/21 1237  . famotidine (PEPCID) tablet 20 mg  20 mg Oral QHS Dallie Piles, RPH   20 mg at 04/01/21 2147  . feeding supplement (ENSURE ENLIVE / ENSURE PLUS) liquid 237 mL  237 mL Oral TID BM Sreenath, Sudheer B, MD   237 mL at 04/02/21 1000  . haloperidol lactate (HALDOL) injection 1 mg  1 mg Intravenous Q6H PRN Harden Bramer T, MD      . hydrALAZINE (APRESOLINE) injection 10-20 mg  10-20 mg Intravenous Q4H PRN Awilda Bill, NP   10 mg at 03/29/21 1626  . ibuprofen (ADVIL) tablet 600 mg  600 mg Oral Q6H PRN Rust-Chester, Britton L, NP   600 mg at 04/01/21 2331  . insulin aspart (novoLOG) injection 0-6 Units  0-6 Units Subcutaneous TID WC Megan Oneal, Jai-Gurmukh, MD      . lidocaine (PF) (XYLOCAINE) 1 % injection 5 mL  5 mL Other Once Awilda Bill, NP      . losartan (COZAAR) tablet 100 mg  100 mg Oral Daily Tyler Pita, MD   100 mg at 04/02/21 1236  . MEDLINE mouth rinse  15 mL Mouth Rinse q12n4p Tyler Pita, MD   15 mL at 04/02/21 1237  . ondansetron (ZOFRAN) injection 4 mg  4 mg Intravenous Q6H PRN Awilda Bill, NP      . polyethylene glycol (MIRALAX / GLYCOLAX) packet 17 g  17 g Oral Daily PRN Tyler Pita, MD      . risperiDONE (RISPERDAL) tablet 2 mg  2 mg Oral QHS Salley Scarlet, MD   2 mg at 04/01/21 2146    Musculoskeletal: Strength & Muscle Tone:  decreased Gait & Station: unsteady Patient leans: N/A            Psychiatric Specialty Exam:  Presentation  General Appearance: Casual  Eye Contact:Good  Speech:Clear and Coherent; Normal Rate  Speech Volume:Normal  Handedness:Right   Mood and Affect  Mood:Anxious  Affect:Congruent   Thought Process  Thought Processes:Coherent  Descriptions of Associations:Intact  Orientation:Full (Time, Place and Person)  Thought Content:Rumination  History of Schizophrenia/Schizoaffective disorder:No  Duration of Psychotic Symptoms:Less than six months  Hallucinations:No data recorded Ideas of Reference:Paranoia  Suicidal Thoughts:No data recorded Homicidal Thoughts:No data recorded  Sensorium  Memory:Immediate Good; Remote Good; Recent Good  Judgment:Intact  Insight:Fair   Executive Functions  Concentration:Fair  Attention Span:Fair  [place]   Psychomotor Activity  Psychomotor Activity:No data recorded  Assets  Assets:Communication Skills; Desire for Improvement; Financial Resources/Insurance; Housing; Social Support; Resilience   Sleep  Sleep:No data recorded  Physical Exam: Physical Exam Vitals and nursing note reviewed.  Constitutional:      Appearance: Normal appearance.  HENT:     Head: Normocephalic and atraumatic.     Mouth/Throat:     Pharynx: Oropharynx is clear.  Eyes:     Pupils: Pupils are equal, round, and reactive to light.  Cardiovascular:     Rate and Rhythm: Normal rate and regular rhythm.  Pulmonary:     Effort: Pulmonary effort is normal.     Breath sounds: Normal breath sounds.  Abdominal:     General: Abdomen is flat.     Palpations: Abdomen is soft.  Musculoskeletal:        General: Normal range of motion.  Skin:    General: Skin is warm and dry.  Neurological:     General: No  focal deficit present.     Mental Status: She is alert. Mental status is at  baseline.  Psychiatric:        Attention and Perception: She is inattentive. She perceives auditory hallucinations.        Mood and Affect: Mood is anxious.        Speech: Speech is tangential.        Behavior: Behavior is agitated. Behavior is not aggressive or hyperactive.        Thought Content: Thought content is paranoid. Thought content does not include homicidal or suicidal ideation.        Cognition and Memory: Cognition is impaired. Memory is impaired.        Judgment: Judgment is impulsive.    Review of Systems  Constitutional: Negative.   HENT: Negative.   Eyes: Negative.   Respiratory: Negative.   Cardiovascular: Negative.   Gastrointestinal: Negative.   Musculoskeletal: Negative.   Skin: Negative.   Neurological: Negative.   Psychiatric/Behavioral: Positive for depression, hallucinations and memory loss. Negative for substance abuse and suicidal ideas. The patient is nervous/anxious and has insomnia.    Blood pressure (!) 156/81, pulse (!) 57, temperature 98.4 F (36.9 C), resp. rate 18, height 5' 5.98" (1.676 m), weight (!) 140.1 kg, SpO2 98 %. Body mass index is 49.88 kg/m.  Treatment Plan Summary: Medication management and Plan As far as her current acute presentation with elements of confusion and intermittent agitation I think that some of that is delirium from the hospitalization probably some contribution of delirium from the steroids.  Patient has no memory of having fallen in the nighttime.  Looking back at that event it appears to have occurred somewhere after 5 AM and the patient had been given 2.5 mg of intravenous Haldol a few hours previously.  This is a rather large dose especially for someone her age.  I have changed the as needed to haloperidol 1 mg IV every 6 for agitation which hopefully will not be needed very often.  I see that the Risperdal dose is now 2 mg at night.  This is a reasonable dose for her and does not need to be changed.  This is not likely  to be contributing to any problematic side effects for her.  Patient was reminded that she is at falls risk in the hospital and should call for a nurse if she wants to get out of bed.  I suspect that the confusion and delirium presentation will gradually improve with conservative management as it has been doing.  Insofar as the original complaint of the auditory hallucinations I think that this is a psychiatric condition and probably would best fit under the category of psychotic depression.  She has no previous history of bipolar disorder or schizophrenia making those diagnoses extremely unlikely.  As far as treating that for the moment I recommend leaving the risperadone in place.  I am going to defer adding any additional medicine as we watch for her mental status in general to improve.  If she continues with gradual improvement might suggest adding antidepressant or readjusting the antipsychotic dose prior to discharge.  Whether she would need inpatient psychiatry is still up in the air.  In part that will depend on whether there is an alternative where she can go someplace to have supervision rather than just going home by herself.  Finally, I am discontinuing the Depakote for which there is no indication.  I will continue to follow-up with her in  this hospital stay.  Disposition: No evidence of imminent risk to self or others at present.   Supportive therapy provided about ongoing stressors. See note for medication management and need for continued reassessment prior to planning for disposition  Alethia Berthold, MD 04/02/2021 3:01 PM

## 2021-04-02 NOTE — [place] Notes (Signed)
eeg done °

## 2021-04-02 NOTE — Progress Notes (Signed)
Occupational Therapy Treatment Patient Details Name: Megan Oneal MRN: [id number] DOB: Aug 01, 1956 Today's Date: 04/02/2021    History of present illness Pt is a 65 y/o female presenting with severe encephalopathy in the context of severe hypoxic respiratory failure requiring intubation. Underlying etiology most likely secondary to medication overdose at home; suspect Flexeril. PMH: HTN, DM2, DDD, anxiety, depression   OT comments  Megan Oneal was seen for OT treatment on this date. Upon arrival to room pt reclined in chair, son at bedside, agreeable to tx. Pt continues to be easily distracted and difficult to engage in logical conversation. CGA + RW for toilet t/f and perihygiene with lateral leans. CGA + RW hand washing standing sinkside. MOD A for LB access seated EOC. Pt left up in chair with son in room, chair alarm on. Pt making good progress toward goals. Pt continues to benefit from skilled OT services to maximize return to PLOF and minimize risk of future falls, injury, caregiver burden, and readmission. Will continue to follow POC. Discharge recommendation remains appropriate.    Follow Up Recommendations  SNF    Equipment Recommendations  3 in 1 bedside commode    Recommendations for Other Services      Precautions / Restrictions Precautions Precautions: Fall Restrictions Weight Bearing Restrictions: No       Mobility Bed Mobility               General bed mobility comments: pt received and left up in chair    Transfers Overall transfer level: Needs assistance Equipment used: Rolling walker (2 wheeled) Transfers: Sit to/from Stand Sit to Stand: Min guard         General transfer comment: CGA for STS from chair and toilet height    Balance Overall balance assessment: Needs assistance Sitting-balance support: Feet supported Sitting balance-Leahy Scale: Good     Standing balance support: No upper extremity supported;During functional activity Standing  balance-Leahy Scale: Fair                             ADL either performed or assessed with clinical judgement   ADL Overall ADL's : Needs assistance/impaired                                       General ADL Comments: CGA + RW for toilet t/f and perihygiene with lateral leans. CGA + RW hand washing standing sinkside. MOD A for LB access seated EOC.               Cognition Arousal/Alertness: Awake/alert Behavior During Therapy: WFL for tasks assessed/performed Overall Cognitive Status: Impaired/Different from baseline Area of Impairment: Orientation;Memory;Attention;Safety/judgement;Following commands;Problem solving;Awareness                 Orientation Level: Disoriented to;Time;Situation Current Attention Level: Alternating Memory: Decreased recall of precautions;Decreased short-term memory Following Commands: Follows one step commands inconsistently;Follows one step commands with increased time Safety/Judgement: Decreased awareness of safety;Decreased awareness of deficits Awareness: Intellectual Problem Solving: Slow processing;Decreased initiation;Difficulty sequencing;Requires verbal cues;Requires tactile cues General Comments: Pt was much more alert today versus on evaluation. Agreeable to session and attempting OOB activity        Exercises Exercises: Other exercises Other Exercises Other Exercises: Pt and family educated re: OT role, DME recs, d/c recs, falls prevention, ECS Other Exercises: Toileting, grooming, sit<>stand x2, !40 ft mobility, sitting/standing  balance/tolerance           Pertinent Vitals/ Pain       Pain Assessment: No/denies pain         Frequency  Min 1X/week        Progress Toward Goals  OT Goals(current goals can now be found in the care plan section)  Progress towards OT goals: Progressing toward goals  Acute Rehab OT Goals Patient Stated Goal: none stated OT Goal Formulation: With  patient Time For Goal Achievement: 04/14/21 Potential to Achieve Goals: Good ADL Goals Pt Will Perform Grooming: with modified independence;sitting Pt Will Perform Lower Body Dressing: with min assist;sitting/lateral leans Pt Will Transfer to Toilet: with min guard assist;ambulating;bedside commode  Plan Discharge plan remains appropriate;Frequency remains appropriate       AM-PAC OT "6 Clicks" Daily Activity     Outcome Measure   Help from another person eating meals?: A Little Help from another person taking care of personal grooming?: A Little Help from another person toileting, which includes using toliet, bedpan, or urinal?: A Little Help from another person bathing (including washing, rinsing, drying)?: A Lot Help from another person to put on and taking off regular upper body clothing?: A Little Help from another person to put on and taking off regular lower body clothing?: A Lot 6 Click Score: 16    End of Session Equipment Utilized During Treatment: Rolling walker  OT Visit Diagnosis: Unsteadiness on feet (R26.81);Other abnormalities of gait and mobility (R26.89);Muscle weakness (generalized) (M62.81)   Activity Tolerance Patient tolerated treatment well   Patient Left in chair;with call bell/phone within reach;with chair alarm set;with family/visitor present   Nurse Communication          Time: 7096-4383 OT Time Calculation (min): 25 min  Charges: OT General Charges $OT Visit: 1 Visit OT Treatments $Self Care/Home Management : 23-37 mins  Dessie Coma, M.S. OTR/L  04/02/21, 1:10 PM  ascom [phone]

## 2021-04-02 NOTE — Progress Notes (Signed)
Inpatient Diabetes Program Recommendations  AACE/ADA: New Consensus Statement on Inpatient Glycemic Control (2015)  Target Ranges:  Prepandial:   less than 140 mg/dL      Peak postprandial:   less than 180 mg/dL (1-2 hours)      Critically ill patients:  140 - 180 mg/dL   Results for MARCHEL, FOOTE (MRN [id number]) as of 04/02/2021 08:29  Ref. Range 04/02/2021 00:13 04/02/2021 03:49 04/02/2021 05:25 04/02/2021 08:24  Glucose-Capillary Latest Ref Range: 70 - 99 mg/dL 96 60 (L) 129 (H) 102 (H)    Admit with: Acute toxic encephalopathy secondary to suspected drug overdose  History: Type 2 Diabetes  Home DM Meds: None listed  Current Orders: Novolog Moderate Correction Scale/ SSI (0-15 units) Q4 hours     MD- Note Mild Hypoglycemic event this AM  Please consider reduction of Novolog SSi to the Sensitive 0-9 unit scale      --Will follow patient during hospitalization--  Wyn Quaker RN, MSN, CDE Diabetes Coordinator Inpatient Glycemic Control Team Team Pager: [phone] (8a-5p)

## 2021-04-02 NOTE — Plan of Care (Signed)
Pt was oriented on initial assessment, but pt has been trying to get out of bed and has expressed she's experiencing anxiety.  Also called 911 b/c she thought somebody was about to kidnap her. Reassured her that we were going to take care of her and we weren't going to let anything happen to her.

## 2021-04-02 NOTE — Plan of Care (Signed)
Continues to be confused and very anxious.  Requested something to help her relax.

## 2021-04-02 NOTE — Hospital Course (Addendum)
65 year old white female admitted [facility] 03/26/2021 unresponsive LK and a 6:30 AM that time-family found a bottle of Flexeril next patient- had presented previously 03/24/2021 To [facility] ER auditory hallucinations-evaluated by psychiatry-Rx Risperdal and told to follow-up with PCP  Tachycardic-bibasilar atelectasis on arrival-CT head no acute abnormalities Tylenol level less than 10, salicylate level less than 7.0 UDS = + tricyclics, VBG 8.06/05/82- Patient intubated/sedated-LP unsuccessful-Rx acyclovir vancomycin Rocephin--CSF ultimately pending Extubated 4/9 with intermittent delusional thoughts MRI brain no acute findings EEG no seizures-generalized dysfunction Eventually psychiatry consulted/neurology consulted     Data Potassium 3.3 WBC 21.4-->16.3 Hemoglobin 11.7-->11.2

## 2021-04-02 NOTE — Progress Notes (Signed)
PROGRESS NOTE   Megan Oneal  OFB:[id number] DOB: 1956/06/17 DOA: 03/26/2021 PCP: Sharilyn Sites, MD  Brief Narrative:  65 year old white female admitted [facility] 03/26/2021 unresponsive LK and a 6:30 AM that time-family found a bottle of Flexeril next patient had presented previously 03/24/2021 To [facility] ER auditory hallucinations-evaluated by psychiatry-Rx Risperdal and told to follow-up with PCP  Tachycardic-bibasilar atelectasis on arrival-CT head no acute abnormalities Tylenol level less than 10, salicylate level less than 7.0 UDS = + tricyclics, VBG 7.08/13/22  Patient intubated/sedated-LP unsuccessful-Rx acyclovir vancomycin Rocephin--CSF cult negative Extubated 4/9 with intermittent delusional thoughts MRI brain no acute findings EEG no seizures-generalized dysfunction Eventually psychiatry consulted/neurology consulted     Hospital-Problem based course  Acute toxic metabolic encephalopathy 2/2?  Flexeril with persistence of encephalopathy Intermittently agitated and confused EEG is still pending to rule out seizures Auditory hallucinations followed previously [employer] of otosclerosis Prior history PTSD Will need outpatient [facility] follow-up Psychiatry inpu appreciated on 4/10--requested follow-up on 4/13 given persistent hallucinations Risperdal 2 at bedtime, Depakote 250 twice daily Can use Haldol if very agitated Acute hypoxic respiratory failure-extubated 4/9\ No oxygen requirement currently CAD/HTN Continue losartan 100 Prior Cardizem, verapamil have been held from admission Lasix every other day on hold Leukocytosis secondary to Decadron Meningitis diagnosis entertained on admission-all cultures however negative Patient seen by infectious disease No further work-up and they have signed off 4/11 Hypokalemia Monitor trends BMI 49.9 Life-threatening-outpatient discussion   DVT prophylaxis: Lovenox Code Status: Full Family Communication: Called patient's  sister Elmyra Ricks [phone] and updated fully Will probably need skilled facility placement and family will coordinate with social worker as one family member lives in [place] and may want to keep her with him Disposition:  Status is: Inpatient  Remains inpatient appropriate because:Unsafe d/c plan   Dispo: The patient is from: Home              Anticipated d/c is to: SNF              Patient currently is not medically stable to d/c.   Difficult to place patient No     Consultants:   Infectious disease  Neurology  Procedures:  Lumbar puncture attempted  Antimicrobials: None   Subjective: Patient coherent but at times is quite hard of hearing Events noted overnight that she had a fall onto her right side She is currently sitting at the nursing station and is coherent does not seem to be in distress She is extremely hard of hearing   Objective: Vitals:   04/02/21 0346 04/02/21 0525 04/02/21 0743 04/02/21 0821  BP: 105/63 124/74 132/75 (!) 156/81  Pulse: (!) 116 (!) 116 100 (!) 57  Resp: 18 18 16 18   Temp: 98.4 F (36.9 C) 98.4 F (36.9 C) 98.4 F (36.9 C) 98.4 F (36.9 C)  TempSrc: Oral  Oral   SpO2: 98% 97% 100% 98%  Weight:      Height:        Intake/Output Summary (Last 24 hours) at 04/02/2021 1059 Last data filed at 04/01/2021 1900 Gross per 24 hour  Intake 660 ml  Output 1150 ml  Net -490 ml   Filed Weights   03/30/21 0429 03/31/21 0417 04/01/21 0441  Weight: (!) 140.1 kg (!) 140.3 kg (!) 140.1 kg    Examination:  EOMI NCAT Mallampati 4 S1-S2 no murmur no rub no gallop CTA B no added sound rales rhonchi Abdomen obese nontender nondistended ROM intact to the right and left knee-both knees have postop  scars-toe pointing as well as dorsiflexion are intact Sensory is intact   Data Reviewed: personally reviewed   CBC    Component Value Date/Time   WBC 21.4 (H) 04/02/2021 0425   RBC 4.23 04/02/2021 0425   HGB 11.7 (L) 04/02/2021 0425   HGB 10.5 (L)  03/28/2014 0440   HCT 36.6 04/02/2021 0425   HCT 36.4 03/19/2014 1355   PLT 239 04/02/2021 0425   PLT 266 03/28/2014 0440   MCV 86.5 04/02/2021 0425   MCV 82 03/19/2014 1355   MCH 27.7 04/02/2021 0425   MCHC 32.0 04/02/2021 0425   RDW 15.6 (H) 04/02/2021 0425   RDW 15.8 (H) 03/19/2014 1355   LYMPHSABS 3.6 04/02/2021 0425   MONOABS 1.3 (H) 04/02/2021 0425   EOSABS 0.6 (H) 04/02/2021 0425   BASOSABS 0.1 04/02/2021 0425   CMP Latest Ref Rng & Units 04/02/2021 03/31/2021 03/30/2021  Glucose 70 - 99 mg/dL 107(H) 99 109(H)  BUN 8 - 23 mg/dL 14 15 19   Creatinine 0.44 - 1.00 mg/dL 0.89 0.85 0.81  Sodium 135 - 145 mmol/L 142 142 145  Potassium 3.5 - 5.1 mmol/L 3.4(L) 3.0(L) 3.1(L)  Chloride 98 - 111 mmol/L 106 107 111  CO2 22 - 32 mmol/L 26 26 28   Calcium 8.9 - 10.3 mg/dL 8.9 8.4(L) 8.6(L)  Total Protein 6.5 - 8.1 g/dL - - -  Total Bilirubin 0.3 - 1.2 mg/dL - - -  Alkaline Phos 38 - 126 U/L - - -  AST 15 - 41 U/L - - -  ALT 0 - 44 U/L - - -     Radiology Studies: No results found.   Scheduled Meds: . aspirin  81 mg Oral Daily  . chlorhexidine  15 mL Mouth Rinse BID  . Chlorhexidine Gluconate Cloth  6 each Topical Daily  . divalproex  250 mg Oral Q12H  . enoxaparin (LOVENOX) injection  0.5 mg/kg Subcutaneous Q24H  . famotidine  20 mg Oral QHS  . feeding supplement  237 mL Oral TID BM  . insulin aspart  0-6 Units Subcutaneous TID WC  . lidocaine (PF)  5 mL Other Once  . losartan  100 mg Oral Daily  . mouth rinse  15 mL Mouth Rinse q12n4p  . risperiDONE  2 mg Oral QHS   Continuous Infusions:   LOS: 7 days   Time spent: 52 minutes  Nita Sells, MD [facility] To contact the attending provider between 7A-7P or the covering provider during after hours 7P-7A, please log into the web site [email or web address] and access using universal [place] password for that web site. If you do not have the password, please call the hospital operator.  04/02/2021, 10:59 AM

## 2021-04-03 DIAGNOSIS — F29 Unspecified psychosis not due to a substance or known physiological condition: Secondary | ICD-10-CM | POA: Diagnosis not present

## 2021-04-03 LAB — CBC WITH DIFFERENTIAL/PLATELET
Abs Immature Granulocytes: 0.27 10*3/uL — ABNORMAL HIGH (ref 0.00–0.07)
Basophils Absolute: 0 10*3/uL (ref 0.0–0.1)
Basophils Relative: 0 %
Eosinophils Absolute: 0.7 10*3/uL — ABNORMAL HIGH (ref 0.0–0.5)
Eosinophils Relative: 4 %
HCT: 32.7 % — ABNORMAL LOW (ref 36.0–46.0)
Hemoglobin: 10.2 g/dL — ABNORMAL LOW (ref 12.0–15.0)
Immature Granulocytes: 2 %
Lymphocytes Relative: 21 %
Lymphs Abs: 3.5 10*3/uL (ref 0.7–4.0)
MCH: 27.1 pg (ref 26.0–34.0)
MCHC: 31.2 g/dL (ref 30.0–36.0)
MCV: 87 fL (ref 80.0–100.0)
Monocytes Absolute: 1.6 10*3/uL — ABNORMAL HIGH (ref 0.1–1.0)
Monocytes Relative: 10 %
Neutro Abs: 10.3 10*3/uL — ABNORMAL HIGH (ref 1.7–7.7)
Neutrophils Relative %: 63 %
Platelets: 233 10*3/uL (ref 150–400)
RBC: 3.76 MIL/uL — ABNORMAL LOW (ref 3.87–5.11)
RDW: 15.6 % — ABNORMAL HIGH (ref 11.5–15.5)
Smear Review: NORMAL
WBC: 16.3 10*3/uL — ABNORMAL HIGH (ref 4.0–10.5)
nRBC: 0 % (ref 0.0–0.2)

## 2021-04-03 LAB — BASIC METABOLIC PANEL
Anion gap: 9 (ref 5–15)
BUN: 10 mg/dL (ref 8–23)
CO2: 26 mmol/L (ref 22–32)
Calcium: 8.3 mg/dL — ABNORMAL LOW (ref 8.9–10.3)
Chloride: 108 mmol/L (ref 98–111)
Creatinine, Ser: 0.77 mg/dL (ref 0.44–1.00)
GFR, Estimated: >60 mL/min (ref >60)
Glucose, Bld: 93 mg/dL (ref 70–99)
Potassium: 3.3 mmol/L — ABNORMAL LOW (ref 3.5–5.1)
Sodium: 143 mmol/L (ref 135–145)

## 2021-04-03 LAB — GLUCOSE, CAPILLARY
Glucose-Capillary: 105 mg/dL — ABNORMAL HIGH (ref 70–99)
Glucose-Capillary: 92 mg/dL (ref 70–99)
Glucose-Capillary: 97 mg/dL (ref 70–99)
Glucose-Capillary: 84 mg/dL (ref 70–99)
Glucose-Capillary: 104 mg/dL — ABNORMAL HIGH (ref 70–99)

## 2021-04-03 LAB — MAGNESIUM: Magnesium: 2.1 mg/dL (ref 1.7–2.4)

## 2021-04-03 MED ORDER — POTASSIUM CHLORIDE CRYS ER 20 MEQ PO TBCR
40.0000 meq | EXTENDED_RELEASE_TABLET | Freq: Every day | ORAL | Status: DC
  Administered 2021-04-03 – 2021-04-04 (×2): 40 meq via ORAL
  Filled 2021-04-03 (×2): qty 2

## 2021-04-03 MED ORDER — SODIUM CHLORIDE 0.9 % IV BOLUS
250.0000 mL | Freq: Once | INTRAVENOUS | Status: AC
  Administered 2021-04-03: 21:00:00 250 mL via INTRAVENOUS

## 2021-04-03 MED ORDER — HALOPERIDOL LACTATE 5 MG/ML IJ SOLN: 1.0000 mg | Freq: Four times a day (QID) | INTRAMUSCULAR | Status: DC | PRN

## 2021-04-03 MED ORDER — LORAZEPAM 2 MG/ML IJ SOLN
1.0000 mg | Freq: Once | INTRAMUSCULAR | Status: AC
  Administered 2021-04-04: 1 mg via INTRAVENOUS
  Filled 2021-04-03: qty 1

## 2021-04-03 MED ORDER — LORAZEPAM 2 MG/ML IJ SOLN: 0.5000 mg | INTRAMUSCULAR | Status: DC | PRN

## 2021-04-03 MED ORDER — DILTIAZEM HCL 25 MG/5ML IV SOLN
5.0000 mg | Freq: Once | INTRAVENOUS | Status: AC
  Administered 2021-04-03: 21:00:00 5 mg via INTRAVENOUS
  Filled 2021-04-03: qty 5

## 2021-04-03 MED ORDER — DILTIAZEM HCL ER COATED BEADS 120 MG PO CP24
120.0000 mg | ORAL_CAPSULE | Freq: Every day | ORAL | Status: DC
  Filled 2021-04-03 (×2): qty 1

## 2021-04-03 MED ORDER — DILTIAZEM HCL 30 MG PO TABS
30.0000 mg | ORAL_TABLET | Freq: Four times a day (QID) | ORAL | Status: DC
  Administered 2021-04-03: 30 mg via ORAL
  Filled 2021-04-03: qty 1

## 2021-04-03 NOTE — TOC Progression Note (Signed)
Transition of Care [facility]) - Progression Note    Patient Details  Name: Megan Oneal MRN: [id number] Date of Birth: Jan 22, 1956  Transition of Care [facility]) CM/SW [place], LCSW Phone Number: 04/03/2021, 2:44 PM  Clinical Narrative: CSW spoke with patient's children at bedside. She has one bed offer from [facility] in [place]. They are agreeable. Admissions coordinator will start insurance authorization and said she would have to be without a sitter for 24-hours prior to admission. CSW made her children aware of frequent denials from [facility]. Discussed home health in event they deny SNF admission. Plan is for patient to return home with son to [place] after rehab.   Expected Discharge Plan: Skilled Nursing Facility Barriers to Discharge: [place] Work up,Requiring sitter/restraints  Expected Discharge Plan and Services Expected Discharge Plan: [place] Choice: [place] arrangements for the past 2 months: Single Family Home                                       Social Determinants of Health (SDOH) Interventions    Readmission Risk Interventions No flowsheet data found.

## 2021-04-03 NOTE — Consult Note (Signed)
[facility] Consult   Reason for Consult: Follow-up for this 65 year old woman who has been in the hospital with altered mental status Referring Physician: Verlon Au Patient Identification: Megan Oneal MRN:  [id number] Principal Diagnosis: Psychosis [facility]) Diagnosis:  Principal Problem:   Psychosis ([place]) Active Problems:   Acute encephalopathy   Total Time spent with patient: 30 minutes  Subjective:   Megan Oneal is a 65 y.o. female patient admitted with "I am doing better".  HPI: Patient seen and chart reviewed.  Today I found her in the afternoon sitting up on the side of the bed.  She made good eye contact and engaged readily in conversation.  She was able to spontaneously identify that she was in the hospital and the name of the hospital.  She remembered both of her children visiting from earlier today.  Patient says she is feeling much stronger and more like herself.  She did say that she is still hearing voices but that they are so much improved that they are just like a slight background noise right now and not really bothersome to her.  Affect is still a little blunted but mood is stated as being better.  Does not report any suicidal ideation.  Patient says she is ready to get out of the hospital.  Past Psychiatric History: See previous  Risk to Self:   Risk to Others:   Prior Inpatient Therapy:   Prior Outpatient Therapy:    Past Medical History:  Past Medical History:  Diagnosis Date  . Anxiety 03/12/2013  . Degenerative disc disease, lumbar    Epidural injections in 2007  . Diabetes mellitus without complication ([place])   . Endometriosis   . Frequent PVCs    12,000/24 hours  . Hypertension   . Otalgia, unspecified   . Palpitations 10/04/2010  . Vascular bruit 01/2013   Right neck    Past Surgical History:  Procedure Laterality Date  . BREAST SURGERY     benign tumor excised-age 89  . CARDIOVASCULAR STRESS TEST  03/13/2008   No scintigraphic evidence  of inducible myocardial ischemia, EKG negative for ischemia, no ECG changes.  . COLONOSCOPY  2009  . COLONOSCOPY N/A 09/21/2019   Procedure: COLONOSCOPY;  Surgeon: Rogene Houston, MD;  [facility];  Service: Endoscopy;  Laterality: N/A;  2:00-10:30  . CYST EXCISION  2007   Scalp  . DOPPLER ECHOCARDIOGRAPHY  03/13/2008   EF >32%, LV systolic function is normal, LV wall function is normal  . LAPAROSCOPIC CHOLECYSTECTOMY  2010  . LUMBAR LAMINECTOMY  2009    L4-L5 and L5-S1 laminectomy with diskectomy; transforaminal  . POLYPECTOMY  09/21/2019   Procedure: POLYPECTOMY;  Surgeon: Rogene Houston, MD;  Location: [facility];  Service: Endoscopy;;  . REPLACEMENT TOTAL KNEE    . SLEEP STUDY  10/29/2009   AHI-1.8/hr, during REM 6.4/hr; RDI-8.0/hr, during REM 15.0/hr; avg oxygen during REM and NREM was 94%  . TOTAL KNEE ARTHROPLASTY Bilateral   . TUBAL LIGATION     Family History:  Family History  Problem Relation Age of Onset  . Diabetes Mother   . Hypertension Mother   . Diabetes Brother   . Hypertension Brother   . Hypertension Father    Family Psychiatric  History: See previous Social History:  Social History   Substance and Sexual Activity  Alcohol Use No     Social History   Substance and Sexual Activity  Drug Use No    Social  History   Socioeconomic History  . Marital status: Divorced    Spouse name: Not on file  . Number of children: 3  . Years of education: Not on file  . Highest education level: Not on file  Occupational History  . Not on file  Tobacco Use  . Smoking status: Former Smoker    Packs/day: 2.00    Years: 20.00    Pack years: 40.00    Types: Cigarettes    Start date: 08/03/1972    Quit date: 08/03/1996    Years since quitting: 24.6  . Smokeless tobacco: Former Systems developer    Quit date: 12/21/1994  Substance and Sexual Activity  . Alcohol use: No  . Drug use: No  . Sexual activity: Not Currently    Birth control/protection: Post-menopausal   Other Topics Concern  . Not on file  Social History Narrative  . Not on file   Social Determinants of Health   Financial Resource Strain: Not on file  Food Insecurity: Not on file  Transportation Needs: Not on file  Physical Activity: Not on file  Stress: Not on file  Social Connections: Not on file   Additional Social History:    Allergies:   Allergies  Allergen Reactions  . Metoprolol Other (See Comments)    Fatigue and malaise; wheezing  . Sulfa Antibiotics     Labs:  Results for orders placed or performed during the hospital encounter of 03/26/21 (from the past 48 hour(s))  Glucose, capillary     Status: Abnormal   Collection Time: 04/01/21  8:40 PM  Result Value Ref Range   Glucose-Capillary 105 (H) 70 - 99 mg/dL    Comment: Glucose reference range applies only to samples taken after fasting for at least 8 hours.  Glucose, capillary     Status: None   Collection Time: 04/02/21 12:13 AM  Result Value Ref Range   Glucose-Capillary 96 70 - 99 mg/dL    Comment: Glucose reference range applies only to samples taken after fasting for at least 8 hours.  Glucose, capillary     Status: Abnormal   Collection Time: 04/02/21  3:49 AM  Result Value Ref Range   Glucose-Capillary 60 (L) 70 - 99 mg/dL    Comment: Glucose reference range applies only to samples taken after fasting for at least 8 hours.  CBC with Differential/Platelet     Status: Abnormal   Collection Time: 04/02/21  4:25 AM  Result Value Ref Range   WBC 21.4 (H) 4.0 - 10.5 K/uL   RBC 4.23 3.87 - 5.11 MIL/uL   Hemoglobin 11.7 (L) 12.0 - 15.0 g/dL   HCT 36.6 36.0 - 46.0 %   MCV 86.5 80.0 - 100.0 fL   MCH 27.7 26.0 - 34.0 pg   MCHC 32.0 30.0 - 36.0 g/dL   RDW 15.6 (H) 11.5 - 15.5 %   Platelets 239 150 - 400 K/uL   nRBC 0.0 0.0 - 0.2 %   Neutrophils Relative % 71 %   Neutro Abs 15.3 (H) 1.7 - 7.7 K/uL   Lymphocytes Relative 17 %   Lymphs Abs 3.6 0.7 - 4.0 K/uL   Monocytes Relative 6 %   Monocytes  Absolute 1.3 (H) 0.1 - 1.0 K/uL   Eosinophils Relative 3 %   Eosinophils Absolute 0.6 (H) 0.0 - 0.5 K/uL   Basophils Relative 0 %   Basophils Absolute 0.1 0.0 - 0.1 K/uL   WBC Morphology MILD LEFT SHIFT (1-5% METAS, OCC MYELO, OCC BANDS)  RBC Morphology MORPHOLOGY UNREMARKABLE    Smear Review Normal platelet morphology    Immature Granulocytes 3 %   Abs Immature Granulocytes 0.56 (H) 0.00 - 0.07 K/uL    Comment: Performed at [facility], [address] [place], [place] [address]  Basic metabolic panel     Status: Abnormal   Collection Time: 04/02/21  4:25 AM  Result Value Ref Range   Sodium 142 135 - 145 mmol/L   Potassium 3.4 (L) 3.5 - 5.1 mmol/L   Chloride 106 98 - 111 mmol/L   CO2 26 22 - 32 mmol/L   Glucose, Bld 107 (H) 70 - 99 mg/dL    Comment: Glucose reference range applies only to samples taken after fasting for at least 8 hours.   BUN 14 8 - 23 mg/dL   Creatinine, Ser 0.89 0.44 - 1.00 mg/dL   Calcium 8.9 8.9 - 10.3 mg/dL   GFR, Estimated >60 >60 mL/min    Comment: (NOTE) Calculated using the CKD-EPI Creatinine Equation (2021)    Anion gap 10 5 - 15    Comment: Performed at [facility], [address] [place], [place] [address]  Magnesium     Status: None   Collection Time: 04/02/21  4:25 AM  Result Value Ref Range   Magnesium 2.1 1.7 - 2.4 mg/dL    Comment: Performed at [facility], [address] [place], [place] [address]  Glucose, capillary     Status: Abnormal   Collection Time: 04/02/21  5:25 AM  Result Value Ref Range   Glucose-Capillary 129 (H) 70 - 99 mg/dL    Comment: Glucose reference range applies only to samples taken after fasting for at least 8 hours.  Glucose, capillary     Status: Abnormal   Collection Time: 04/02/21  8:24 AM  Result Value Ref Range   Glucose-Capillary 102 (H) 70 - 99 mg/dL    Comment: Glucose reference range applies only to samples taken after fasting for at least 8 hours.  Glucose, capillary      Status: Abnormal   Collection Time: 04/02/21 12:40 PM  Result Value Ref Range   Glucose-Capillary 120 (H) 70 - 99 mg/dL    Comment: Glucose reference range applies only to samples taken after fasting for at least 8 hours.  Glucose, capillary     Status: None   Collection Time: 04/02/21  3:41 PM  Result Value Ref Range   Glucose-Capillary 91 70 - 99 mg/dL    Comment: Glucose reference range applies only to samples taken after fasting for at least 8 hours.  Glucose, capillary     Status: None   Collection Time: 04/02/21  8:38 PM  Result Value Ref Range   Glucose-Capillary 76 70 - 99 mg/dL    Comment: Glucose reference range applies only to samples taken after fasting for at least 8 hours.   Comment 1 Notify RN   Glucose, capillary     Status: Abnormal   Collection Time: 04/03/21  3:06 AM  Result Value Ref Range   Glucose-Capillary 105 (H) 70 - 99 mg/dL    Comment: Glucose reference range applies only to samples taken after fasting for at least 8 hours.   Comment 1 Notify RN   CBC with Differential/Platelet     Status: Abnormal   Collection Time: 04/03/21  4:09 AM  Result Value Ref Range   WBC 16.3 (H) 4.0 - 10.5 K/uL   RBC 3.76 (L) 3.87 - 5.11 MIL/uL  Hemoglobin 10.2 (L) 12.0 - 15.0 g/dL   HCT 32.7 (L) 36.0 - 46.0 %   MCV 87.0 80.0 - 100.0 fL   MCH 27.1 26.0 - 34.0 pg   MCHC 31.2 30.0 - 36.0 g/dL   RDW 15.6 (H) 11.5 - 15.5 %   Platelets 233 150 - 400 K/uL   nRBC 0.0 0.0 - 0.2 %   Neutrophils Relative % 63 %   Neutro Abs 10.3 (H) 1.7 - 7.7 K/uL   Lymphocytes Relative 21 %   Lymphs Abs 3.5 0.7 - 4.0 K/uL   Monocytes Relative 10 %   Monocytes Absolute 1.6 (H) 0.1 - 1.0 K/uL   Eosinophils Relative 4 %   Eosinophils Absolute 0.7 (H) 0.0 - 0.5 K/uL   Basophils Relative 0 %   Basophils Absolute 0.0 0.0 - 0.1 K/uL   WBC Morphology MORPHOLOGY UNREMARKABLE    RBC Morphology MORPHOLOGY UNREMARKABLE    Smear Review Normal platelet morphology    Immature Granulocytes 2 %   Abs  Immature Granulocytes 0.27 (H) 0.00 - 0.07 K/uL    Comment: Performed at [facility] [address], [place], [place] [address]  Basic metabolic panel     Status: Abnormal   Collection Time: 04/03/21  4:09 AM  Result Value Ref Range   Sodium 143 135 - 145 mmol/L   Potassium 3.3 (L) 3.5 - 5.1 mmol/L   Chloride 108 98 - 111 mmol/L   CO2 26 22 - 32 mmol/L   Glucose, Bld 93 70 - 99 mg/dL    Comment: Glucose reference range applies only to samples taken after fasting for at least 8 hours.   BUN 10 8 - 23 mg/dL   Creatinine, Ser 0.77 0.44 - 1.00 mg/dL   Calcium 8.3 (L) 8.9 - 10.3 mg/dL   GFR, Estimated >60 >60 mL/min    Comment: (NOTE) Calculated using the CKD-EPI Creatinine Equation (2021)    Anion gap 9 5 - 15    Comment: Performed at [facility], [address] [place], [place] [address]  Magnesium     Status: None   Collection Time: 04/03/21  4:09 AM  Result Value Ref Range   Magnesium 2.1 1.7 - 2.4 mg/dL    Comment: Performed at [facility], [address] [place], [place] [address]  Glucose, capillary     Status: None   Collection Time: 04/03/21  8:20 AM  Result Value Ref Range   Glucose-Capillary 92 70 - 99 mg/dL    Comment: Glucose reference range applies only to samples taken after fasting for at least 8 hours.  Glucose, capillary     Status: None   Collection Time: 04/03/21 12:08 PM  Result Value Ref Range   Glucose-Capillary 97 70 - 99 mg/dL    Comment: Glucose reference range applies only to samples taken after fasting for at least 8 hours.  Glucose, capillary     Status: None   Collection Time: 04/03/21  4:40 PM  Result Value Ref Range   Glucose-Capillary 84 70 - 99 mg/dL    Comment: Glucose reference range applies only to samples taken after fasting for at least 8 hours.    Current Facility-Administered Medications  Medication Dose Route Frequency Provider Last Rate Last Admin  . aspirin chewable tablet 81 mg  81 mg Oral Daily  Tyler Pita, MD   81 mg at 04/03/21 0854  . chlorhexidine (PERIDEX) 0.12 % solution 15 mL  15 mL Mouth Rinse BID  Tyler Pita, MD   15 mL at 04/03/21 0854  . Chlorhexidine Gluconate Cloth 2 % PADS 6 each  6 each Topical Daily Flora Lipps, MD   6 each at 04/02/21 2129  . enoxaparin (LOVENOX) injection 72.5 mg  0.5 mg/kg Subcutaneous Q24H Flora Lipps, MD   72.5 mg at 04/03/21 0855  . famotidine (PEPCID) tablet 20 mg  20 mg Oral QHS Dallie Piles, RPH   20 mg at 04/02/21 2124  . feeding supplement (ENSURE ENLIVE / ENSURE PLUS) liquid 237 mL  237 mL Oral TID BM Sreenath, Sudheer B, MD   237 mL at 04/02/21 2122  . haloperidol lactate (HALDOL) injection 1 mg  1 mg Intravenous Q6H PRN D'Arcy Abraha, Madie Reno, MD   1 mg at 04/03/21 0304  . hydrALAZINE (APRESOLINE) injection 10-20 mg  10-20 mg Intravenous Q4H PRN Awilda Bill, NP   10 mg at 03/29/21 1626  . ibuprofen (ADVIL) tablet 600 mg  600 mg Oral Q6H PRN Rust-Chester, Britton L, NP   600 mg at 04/01/21 2331  . insulin aspart (novoLOG) injection 0-6 Units  0-6 Units Subcutaneous TID WC Samtani, Jai-Gurmukh, MD      . lidocaine (PF) (XYLOCAINE) 1 % injection 5 mL  5 mL Other Once Awilda Bill, NP      . losartan (COZAAR) tablet 100 mg  100 mg Oral Daily Tyler Pita, MD   100 mg at 04/03/21 0853  . MEDLINE mouth rinse  15 mL Mouth Rinse q12n4p Tyler Pita, MD   15 mL at 04/03/21 1651  . ondansetron (ZOFRAN) injection 4 mg  4 mg Intravenous Q6H PRN Awilda Bill, NP      . polyethylene glycol (MIRALAX / GLYCOLAX) packet 17 g  17 g Oral Daily PRN Tyler Pita, MD      . potassium chloride SA (KLOR-CON) CR tablet 40 mEq  40 mEq Oral Daily Nita Sells, MD   40 mEq at 04/03/21 0853  . risperiDONE (RISPERDAL) tablet 2 mg  2 mg Oral QHS Salley Scarlet, MD   2 mg at 04/02/21 2124    Musculoskeletal: Strength & Muscle Tone: within normal limits Gait & Station: normal Patient leans:  N/A            Psychiatric Specialty Exam:  Presentation  General Appearance: Casual  Eye Contact:Good  Speech:Clear and Coherent; Normal Rate  Speech Volume:Normal  Handedness:Right   Mood and Affect  Mood:Anxious  Affect:Congruent   Thought Process  Thought Processes:Coherent  Descriptions of Associations:Intact  Orientation:Full (Time, Place and Person)  Thought Content:Rumination  History of Schizophrenia/Schizoaffective disorder:No  Duration of Psychotic Symptoms:Less than six months  Hallucinations:No data recorded Ideas of Reference:Paranoia  Suicidal Thoughts:No data recorded Homicidal Thoughts:No data recorded  Sensorium  Memory:Immediate Good; Remote Good; Recent Good  Judgment:Intact  Insight:Fair   Executive Functions  Concentration:Fair  Attention Span:Fair  [place]   Psychomotor Activity  Psychomotor Activity:No data recorded  Assets  Assets:Communication Skills; Desire for Improvement; Financial Resources/Insurance; Housing; Social Support; Resilience   Sleep  Sleep:No data recorded  Physical Exam: Physical Exam Vitals and nursing note reviewed.  Constitutional:      Appearance: Normal appearance.  HENT:     Head: Normocephalic and atraumatic.     Mouth/Throat:     Pharynx: Oropharynx is clear.  Eyes:     Pupils: Pupils are equal, round, and reactive to light.  Cardiovascular:  Rate and Rhythm: Normal rate and regular rhythm.  Pulmonary:     Effort: Pulmonary effort is normal.     Breath sounds: Normal breath sounds.  Abdominal:     General: Abdomen is flat.     Palpations: Abdomen is soft.  Musculoskeletal:        General: Normal range of motion.  Skin:    General: Skin is warm and dry.  Neurological:     General: No focal deficit present.     Mental Status: She is alert. Mental status is at baseline.  Psychiatric:        Attention and Perception:  Attention normal. She perceives auditory hallucinations.        Mood and Affect: Mood normal.        Speech: Speech is tangential.        Behavior: Behavior is cooperative.        Thought Content: Thought content normal. Thought content does not include homicidal or suicidal ideation.        Cognition and Memory: Memory is impaired.        Judgment: Judgment normal.    Review of Systems  Constitutional: Negative.   HENT: Negative.   Eyes: Negative.   Respiratory: Negative.   Cardiovascular: Negative.   Gastrointestinal: Negative.   Musculoskeletal: Negative.   Skin: Negative.   Neurological: Negative.   Psychiatric/Behavioral: Positive for hallucinations and memory loss. Negative for depression, substance abuse and suicidal ideas. The patient is not nervous/anxious and does not have insomnia.    Blood pressure (!) 106/49, pulse (!) 128, temperature 99.1 F (37.3 C), temperature source Oral, resp. rate 18, height 5' 5.98" (1.676 m), weight (!) 140.4 kg, SpO2 98 %. Body mass index is 49.98 kg/m.  Treatment Plan Summary: Medication management and Plan Patient does seem to have improved today which is a relief.  Some of the delirium like symptoms she was showing yesterday seemed to have cleared up.  Her mood and affect still seem a little blunted and she is still having the voices but all of that is improved as well.  I understand that she is likely to be discharged within the next day if arrangements can be made for an accepting facility.  At this point I am not making any changes to medicine.  Would not add any medicine and also would not stop the Risperdal yet.  She will certainly need follow-up out into the next month or 2 as an outpatient but is stable enough for discharge.  Not acutely dangerous.  Congratulated patient on her recovery and encouraged her to continue with medication management with eating well staying hydrated and getting up and moving around to improve her  strength.  Disposition: No evidence of imminent risk to self or others at present.   Patient does not meet criteria for psychiatric inpatient admission. Supportive therapy provided about ongoing stressors. Discussed crisis plan, support from social network, calling 911, coming to the Emergency Department, and calling Suicide Hotline.  Alethia Berthold, MD 04/03/2021 5:16 PM

## 2021-04-03 NOTE — Progress Notes (Signed)
   04/03/21 0854  Assess: MEWS Score  Temp 98.3 F (36.8 C)  Pulse Rate (!) 117  Resp 17  SpO2 97 %  O2 Device Room Air  Patient Activity (if Appropriate) In bed  Assess: MEWS Score  MEWS Temp 0  MEWS Systolic 0  MEWS Pulse 2  MEWS RR 0  MEWS LOC 0  MEWS Score 2  MEWS Score Color Yellow  Document  Progress note created (see row info) Yes  See MAR gave scheduled medication; will continue to monitor pt throughout this writer's shift

## 2021-04-03 NOTE — Plan of Care (Signed)
Pt got up to go to BR w/sitter.  When she was going  back to the bed she became combative with the sitter. Closed the bathroom door on the sitter and almost fell. Alerted the on call dr and he placed new orders.

## 2021-04-03 NOTE — Care Management Important Message (Signed)
Important Message  Patient Details  Name: Megan Oneal MRN: [id number] Date of Birth: [birth year]   Medicare Important Message Given:  Yes     Juliann Pulse A Jennica Tagliaferri 04/03/2021, 10:13 AM

## 2021-04-03 NOTE — Progress Notes (Signed)
Physical Therapy Treatment Patient Details Name: Megan Oneal MRN: [id number] DOB: 13-Oct-1956 Today's Date: 04/03/2021    History of Present Illness Pt is a 65 y/o female presenting with severe encephalopathy in the context of severe hypoxic respiratory failure requiring intubation. Underlying etiology most likely secondary to medication overdose at home; suspect Flexeril. PMH: HTN, DM2, DDD, anxiety, depression    PT Comments    Pt in bathroom with tech upon arrival for ADL tasks.  She is walking well with walker and able to progress gait 90' in hallway with RW and min guard.  Cues for safety.  Participated in exercises as described below.  Discussed with son and daughter in room.  Pt lives alone with family out of town.  Pt was independent at home with no AD needed.  They feel SNF is still appropriate despite improved mobility.  Agree as pt is not at baseline and would continue to struggle at current level.   Follow Up Recommendations  SNF     Equipment Recommendations       Recommendations for Other Services       Precautions / Restrictions Precautions Precautions: Fall Restrictions Weight Bearing Restrictions: No    Mobility  Bed Mobility               General bed mobility comments: pt received and left up in chair    Transfers Overall transfer level: Needs assistance Equipment used: Rolling walker (2 wheeled) Transfers: Sit to/from Stand Sit to Stand: Min guard            Ambulation/Gait Ambulation/Gait assistance: Min guard;Min assist Gait Distance (Feet): 90 Feet Assistive device: Rolling walker (2 wheeled) Gait Pattern/deviations: Step-through pattern;Decreased step length - right;Decreased step length - left;Trunk flexed Gait velocity: decreased   General Gait Details: generally steady but slow   Chief Strategy Officer    Modified Rankin (Stroke Patients Only)       Balance Overall balance assessment: Needs  assistance Sitting-balance support: Feet supported Sitting balance-Leahy Scale: Good     Standing balance support: No upper extremity supported;During functional activity Standing balance-Leahy Scale: Fair                              Cognition Arousal/Alertness: Awake/alert Behavior During Therapy: WFL for tasks assessed/performed Overall Cognitive Status: Within Functional Limits for tasks assessed                                        Exercises Other Exercises Other Exercises: standing 2 x 10 marching, SLR and heel raises with walker for support    General Comments        Pertinent Vitals/Pain Pain Assessment: No/denies pain    Home Living                      Prior Function            PT Goals (current goals can now be found in the care plan section) Progress towards PT goals: Progressing toward goals    Frequency    Min 2X/week      PT Plan Current plan remains appropriate    Co-evaluation              AM-PAC PT "6 Clicks" Mobility  Outcome Measure  Help needed turning from your back to your side while in a flat bed without using bedrails?: A Little Help needed moving from lying on your back to sitting on the side of a flat bed without using bedrails?: A Little Help needed moving to and from a bed to a chair (including a wheelchair)?: A Little Help needed standing up from a chair using your arms (e.g., wheelchair or bedside chair)?: A Little Help needed to walk in hospital room?: A Little Help needed climbing 3-5 steps with a railing? : A Lot 6 Click Score: 17    End of Session Equipment Utilized During Treatment: Gait belt Activity Tolerance: Patient tolerated treatment well Patient left: in chair;with call bell/phone within reach;with family/visitor present;with nursing/sitter in room Nurse Communication: Mobility status PT Visit Diagnosis: Unsteadiness on feet (R26.81);Muscle weakness (generalized)  (M62.81);Difficulty in walking, not elsewhere classified (R26.2)     Time: 7573-2256 PT Time Calculation (min) (ACUTE ONLY): 13 min  Charges:  $Gait Training: 8-22 mins                    Chesley Noon, PTA 04/03/21, 12:44 PM

## 2021-04-03 NOTE — Progress Notes (Signed)
PROGRESS NOTE   Megan Oneal  OJJ:[id number] DOB: 05-Aug-1956 DOA: 03/26/2021 PCP: Sharilyn Sites, MD  Brief Narrative:  65 year old white female admitted [facility] 03/26/2021 unresponsive LK and a 6:30 AM that time-family found a bottle of Flexeril next patient had presented previously 03/24/2021 To [facility] ER auditory hallucinations-evaluated by psychiatry-Rx Risperdal and told to follow-up with PCP  Tachycardic-bibasilar atelectasis on arrival-CT head no acute abnormalities Tylenol level less than 10, salicylate level less than 7.0 UDS = + tricyclics, VBG 9.3/71/69  Patient intubated/sedated-LP unsuccessful-Rx acyclovir vancomycin Rocephin--CSF cult negative Extubated 4/9 with intermittent delusional thoughts MRI brain no acute findings EEG no seizures-generalized dysfunction Eventually psychiatry consulted/neurology consulted  Psychiatry reconsulted 04/02/2021-they felt potential steroid induced delirium might of contributed to some of her confusion and changed her Haldol to every 6 hours 1 mg only They also recommended continuation of Risperdal 2 mg, auditory hallucinations were probably secondary to psychotic depression and they discontinued her Depakote   Hospital-Problem based course  Acute toxic metabolic encephalopathy 2/2?  Flexeril with persistence of encephalopathy Agitation is much improved she is only slightly confused now EEG negative for focal seizures She possibly was somewhat confused because of steroid psychosis which is resolving well and is redirectable Auditory hallucinations followed previously [employer] of otosclerosis Prior history PTSD Will need outpatient [facility] follow-up Psychiatry input 4/13 noted Risperdal 2 at bedtime, Depakote 250 twice daily Can use Haldol if very agitated but will try to avoid Acute hypoxic respiratory failure-extubated 4/9\ No oxygen requirement currently CAD/HTN Continue losartan 100 Prior Cardizem, verapamil have been held  from admission Lasix every other day on hold Leukocytosis secondary to Decadron Meningitis diagnosis entertained on admission-all cultures however negative Patient seen by infectious disease No further work-up and they have signed off 4/11 Hypokalemia Monitor trends BMI 49.9 Life-threatening-outpatient discussion   DVT prophylaxis: Lovenox Code Status: Full Family Communication: Called patient's sister Elmyra Ricks [phone] and updated fully on 4/13 Discussed with son who lives in [place] and daughter who lives in [place] at the bedside in person and explained plan of care- barriers to discharge include authorizations from insurances per discussion with social work Disposition:  Status is: Inpatient  Remains inpatient appropriate because:Unsafe d/c plan   Dispo: The patient is from: Home              Anticipated d/c is to: SNF              Patient currently is medically stable to d/c.   Difficult to place patient No     Consultants:   Infectious disease  Neurology  Procedures:  Lumbar puncture attempted  Antimicrobials: None   Subjective: More coherent although hard of hearing No chest pain no fever Eating and drinking some   Objective: Vitals:   04/03/21 0529 04/03/21 0726 04/03/21 0854 04/03/21 1144  BP:  (!) 161/87  140/71  Pulse:  (!) 125 (!) 117 (!) 108  Resp:  20 17 17   Temp:  98.3 F (36.8 C) 98.3 F (36.8 C) 98.1 F (36.7 C)  TempSrc:  Oral Oral Oral  SpO2:  97% 97% 99%  Weight: (!) 140.4 kg     Height:        Intake/Output Summary (Last 24 hours) at 04/03/2021 1418 Last data filed at 04/03/2021 1056 Gross per 24 hour  Intake 480 ml  Output 1700 ml  Net -1220 ml   Filed Weights   03/31/21 0417 04/01/21 0441 04/03/21 0529  Weight: (!) 140.3 kg (!) 140.1 kg Marland Kitchen)  140.4 kg    Examination:  Mallampati 4 More coherent S1-S2 no murmur Chest clear Abdomen obese nontender nondistended Finger-nose-finger is intact Knees have postop  scars   Data Reviewed: personally reviewed   Data Potassium 3.3 WBC 21.4-->16.3 Hemoglobin 11.7-->11.2  Radiology Studies: EEG adult  Result Date: 04-25-21 Theodosia Blender, MD     04-25-2021  8:02 PM [facility] EEG HISTORY:  65 year old female presents with altered mental status. INTRODUCTION:  A digital EEG was performed in the laboratory using the standard international 10/20 system of electrode placement in addition to one channel of EKG monitoring.  Hyperventilation was not performed. Photic Stimulation was performed.  This tracing captures wakefulness through drowsiness. This EEG was recording for about 20 minutes. DESCRIPTION OF RECORD:  In the most alert state the alpha rhythm is 9 Hz in frequency, which is seen in the occipital region and attenuates with eye opening and is bilaterally synchronous and symmetrical.  No focal slowing, sharp waves, or epileptiform discharges are seen. Photic stimulation was performed and did not produce any abnormalities. Heart rate was irregular at a rate of 120 bpm. IMPRESSION:  This is a normal adult EEG in the awake and drowsy states. No focal slowing, focal abnormalities, epileptiform discharges, or electrographic seizures are seen.  Of note, a normal EEG does not exclude the diagnosis of seizure disorder.  A repeat sleep deprived EEG or 24 hour EEG could be considered.  The abnormality on single lead EKG could be further evaluated by 12 lead EKG.  Clinical correlation is recommended.     Scheduled Meds: . aspirin  81 mg Oral Daily  . chlorhexidine  15 mL Mouth Rinse BID  . Chlorhexidine Gluconate Cloth  6 each Topical Daily  . enoxaparin (LOVENOX) injection  0.5 mg/kg Subcutaneous Q24H  . famotidine  20 mg Oral QHS  . feeding supplement  237 mL Oral TID BM  . insulin aspart  0-6 Units Subcutaneous TID WC  . lidocaine (PF)  5 mL Other Once  . losartan  100 mg Oral Daily  . mouth rinse  15 mL Mouth Rinse q12n4p  .  potassium chloride  40 mEq Oral Daily  . risperiDONE  2 mg Oral QHS   Continuous Infusions:   LOS: 8 days   Time spent: 12 minutes  Nita Sells, MD [facility] To contact the attending provider between 7A-7P or the covering provider during after hours 7P-7A, please log into the web site [email or web address] and access using universal [place] password for that web site. If you do not have the password, please call the hospital operator.  04/03/2021, 2:18 PM

## 2021-04-03 NOTE — Progress Notes (Signed)
   04/03/21 1555  Assess: MEWS Score  Temp 99.1 F (37.3 C)  BP (!) 106/49  Pulse Rate (!) 128  Resp 18  SpO2 98 %  O2 Device Room Air  Patient Activity (if Appropriate) In bed  Assess: MEWS Score  MEWS Temp 0  MEWS Systolic 0  MEWS Pulse 2  MEWS RR 0  MEWS LOC 0  MEWS Score 2  MEWS Score Color Yellow  Assess: if the MEWS score is Yellow or Red  Were vital signs taken at a resting state? Yes  Focused Assessment No change from prior assessment  Early Detection of Sepsis Score *See Row Information* Low  MEWS guidelines implemented *See Row Information* No, previously red, continue vital signs every 4 hours  Notify: Charge Nurse/RN  Name of Charge Nurse/RN Notified Allen Kell RN  Date Charge Nurse/RN Notified 04/03/21  Time Charge Nurse/RN Notified 1555  Document  Progress note created (see row info) Yes  Will continue to monitor pt while this writer is on shift

## 2021-04-03 NOTE — Progress Notes (Signed)
   04/03/21 0726  Assess: MEWS Score  Temp 98.3 F (36.8 C)  BP (!) 161/87  Pulse Rate (!) 125  Resp 20  SpO2 97 %  O2 Device Room Air  Patient Activity (if Appropriate) In bed  Assess: MEWS Score  MEWS Temp 0  MEWS Systolic 0  MEWS Pulse 2  MEWS RR 0  MEWS LOC 0  MEWS Score 2  MEWS Score Color Yellow  Assess: if the MEWS score is Yellow or Red  Were vital signs taken at a resting state? Yes  Focused Assessment No change from prior assessment  Early Detection of Sepsis Score *See Row Information* Low  MEWS guidelines implemented *See Row Information* No, previously yellow, continue vital signs every 4 hours  Treat  MEWS Interventions Administered scheduled meds/treatments  Notify: Charge Nurse/RN  Name of Charge Nurse/RN Notified Allen Kell RN  Date Charge Nurse/RN Notified 04/03/21  Time Charge Nurse/RN Notified 0726  Document  Patient Outcome Stabilized after interventions  Progress note created (see row info) Yes  Bedside report indicated pt has had a history of Yellow MEWS for increased BP and heart rate on scheduled medication; to give scheduled medication per [facility]; will continue to monitor pt throughout this writer's shift today.

## 2021-04-04 DIAGNOSIS — F29 Unspecified psychosis not due to a substance or known physiological condition: Secondary | ICD-10-CM | POA: Diagnosis not present

## 2021-04-04 LAB — CBC WITH DIFFERENTIAL/PLATELET
Abs Immature Granulocytes: 0.55 10*3/uL — ABNORMAL HIGH (ref 0.00–0.07)
Basophils Absolute: 0.1 10*3/uL (ref 0.0–0.1)
Basophils Relative: 0 %
Eosinophils Absolute: 0.5 10*3/uL (ref 0.0–0.5)
Eosinophils Relative: 3 %
HCT: 33 % — ABNORMAL LOW (ref 36.0–46.0)
Hemoglobin: 10.7 g/dL — ABNORMAL LOW (ref 12.0–15.0)
Immature Granulocytes: 3 %
Lymphocytes Relative: 16 %
Lymphs Abs: 2.7 10*3/uL (ref 0.7–4.0)
MCH: 27.2 pg (ref 26.0–34.0)
MCHC: 32.4 g/dL (ref 30.0–36.0)
MCV: 84 fL (ref 80.0–100.0)
Monocytes Absolute: 2.2 10*3/uL — ABNORMAL HIGH (ref 0.1–1.0)
Monocytes Relative: 13 %
Neutro Abs: 10.9 10*3/uL — ABNORMAL HIGH (ref 1.7–7.7)
Neutrophils Relative %: 65 %
Platelets: 252 10*3/uL (ref 150–400)
RBC: 3.93 MIL/uL (ref 3.87–5.11)
RDW: 15.3 % (ref 11.5–15.5)
Smear Review: NORMAL
WBC: 16.9 10*3/uL — ABNORMAL HIGH (ref 4.0–10.5)
nRBC: 0 % (ref 0.0–0.2)

## 2021-04-04 LAB — BASIC METABOLIC PANEL
Anion gap: 10 (ref 5–15)
BUN: 9 mg/dL (ref 8–23)
CO2: 25 mmol/L (ref 22–32)
Calcium: 8.4 mg/dL — ABNORMAL LOW (ref 8.9–10.3)
Chloride: 106 mmol/L (ref 98–111)
Creatinine, Ser: 0.78 mg/dL (ref 0.44–1.00)
GFR, Estimated: >60 mL/min (ref >60)
Glucose, Bld: 102 mg/dL — ABNORMAL HIGH (ref 70–99)
Potassium: 3.8 mmol/L (ref 3.5–5.1)
Sodium: 141 mmol/L (ref 135–145)

## 2021-04-04 LAB — MAGNESIUM: Magnesium: 2.2 mg/dL (ref 1.7–2.4)

## 2021-04-04 LAB — GLUCOSE, CAPILLARY
Glucose-Capillary: 120 mg/dL — ABNORMAL HIGH (ref 70–99)
Glucose-Capillary: 102 mg/dL — ABNORMAL HIGH (ref 70–99)

## 2021-04-04 MED ORDER — LOSARTAN POTASSIUM 50 MG PO TABS
50.0000 mg | ORAL_TABLET | Freq: Every day | ORAL | Status: DC
  Administered 2021-04-05: 50 mg via ORAL
  Filled 2021-04-04: qty 1

## 2021-04-04 MED ORDER — METOPROLOL TARTRATE 5 MG/5ML IV SOLN
5.0000 mg | Freq: Four times a day (QID) | INTRAVENOUS | Status: DC | PRN
  Administered 2021-04-04: 5 mg via INTRAVENOUS
  Filled 2021-04-04: qty 5

## 2021-04-04 MED ORDER — METOPROLOL TARTRATE 5 MG/5ML IV SOLN
2.5000 mg | Freq: Once | INTRAVENOUS | Status: AC
  Administered 2021-04-04: 2.5 mg via INTRAVENOUS
  Filled 2021-04-04: qty 5

## 2021-04-04 MED ORDER — VERAPAMIL HCL 40 MG PO TABS
40.0000 mg | ORAL_TABLET | Freq: Two times a day (BID) | ORAL | Status: DC
  Administered 2021-04-05 (×2): 40 mg via ORAL
  Filled 2021-04-04 (×5): qty 1

## 2021-04-04 MED ORDER — DILTIAZEM HCL ER COATED BEADS 180 MG PO CP24
180.0000 mg | ORAL_CAPSULE | Freq: Every day | ORAL | Status: DC
  Administered 2021-04-04 – 2021-04-05 (×2): 180 mg via ORAL
  Filled 2021-04-04 (×2): qty 1

## 2021-04-04 MED ORDER — METOPROLOL TARTRATE 5 MG/5ML IV SOLN: 5.0000 mg | Freq: Once | INTRAVENOUS | Status: DC

## 2021-04-04 MED ORDER — RISPERIDONE 3 MG PO TABS
3.0000 mg | ORAL_TABLET | Freq: Every day | ORAL | Status: DC
  Administered 2021-04-04: 3 mg via ORAL
  Filled 2021-04-04 (×2): qty 1

## 2021-04-04 NOTE — Progress Notes (Signed)
Patient became agitated and is refusing the heart monitor, she states that the monitor is shocking her. I will reassess and attempt to replace the monitor.

## 2021-04-04 NOTE — Progress Notes (Signed)
PROGRESS NOTE   Megan Oneal  IRW:[id number] DOB: May 18, 1956 DOA: 03/26/2021 PCP: Sharilyn Sites, MD  Brief Narrative:  65 year old white female admitted [facility] 03/26/2021 unresponsive LK and a 6:30 AM that time-family found a bottle of Flexeril next patient had presented previously 03/24/2021 To [facility] ER auditory hallucinations-evaluated by psychiatry-Rx Risperdal and told to follow-up with PCP  Tachycardic-bibasilar atelectasis on arrival-CT head no acute abnormalities Tylenol level less than 10, salicylate level less than 7.0 UDS = + tricyclics, VBG 7.06/08/49  Patient intubated/sedated-LP unsuccessful-Rx acyclovir vancomycin Rocephin--CSF cult negative Extubated 4/9 with intermittent delusional thoughts MRI brain no acute findings EEG no seizures-generalized dysfunction Eventually psychiatry consulted/neurology consulted  Psychiatry reconsulted 04/02/2021-they felt potential steroid induced delirium might of contributed to some of her confusion and changed her Haldol to every 6 hours 1 mg only They also recommended continuation of Risperdal 2 mg, auditory hallucinations were probably secondary to psychotic depression and they discontinued her Depakote   Hospital-Problem based course  Acute toxic metabolic encephalopathy 2/2?  Flexeril with persistence of encephalopathy Intermittent bouts of agitation and aggression 4/14-psychiatry reconsulted--I am increasing her Risperdal at night to 3 mg and appreciate Dr. Weber Cooks discussing with family EEG negative for focal seizures Auditory hallucinations followed previously [facility] of otosclerosis Prior history PTSD Will need outpatient [facility] follow-up Psychiatry input 4/13 noted Increased under their direction on 4/15 Risperdal 2--->3 at bedtime, Depakote 250 discontinued by psychiatry As needed Haldol/Ativan if very agitated Has safety sitter Acute hypoxic respiratory failure-extubated 4/9 No oxygen requirement  currently CAD/HTN Cut back losartan from 150 Start Cardizem 180 CD discontinue 3 times daily Cardizem Verapamil from prior to admission held Lasix every other day on hold Leukocytosis secondary to Decadron Meningitis diagnosis entertained on admission-all cultures however negative Patient seen by infectious disease No further work-up and they have signed off 4/11 Hypokalemia Monitor trends BMI 49.9 Life-threatening-outpatient discussion   DVT prophylaxis: Lovenox Code Status: Full Family Communication:  Discussed with son who lives in [place] and daughter who lives in [place] at the bedside in person and explained plan of care- barriers to discharge include authorizations from insurances per discussion with social work Disposition:  Status is: Inpatient  Remains inpatient appropriate because:Unsafe d/c plan   Dispo: The patient is from: Home              Anticipated d/c is to: SNF              Patient currently is medically stable to d/c.   Difficult to place patient No     Consultants:   Infectious disease  Neurology  Procedures:  Lumbar puncture attempted  Antimicrobials: None   Subjective:  Overnight events noted Patient transferred to telemetry unit because of tachycardia She is quite clear and wishes to leave the hospital and tells me she cannot afford to stay here indefinitely Intermittently however she becomes tangential and confused with her son I had a long discussion with both daughter [name] and her son today  Objective: Vitals:   04/03/21 2040 04/03/21 2349 04/04/21 0430 04/04/21 0610  BP: (!) 148/48 136/61  137/72  Pulse: (!) 120 (!) 130 97 78  Resp: 20 20  17   Temp: 99.1 F (37.3 C) 99 F (37.2 C)  99.2 F (37.3 C)  TempSrc: Oral Oral  Oral  SpO2: 98% 97%  100%  Weight:    (!) 138.7 kg  Height:        Intake/Output Summary (Last 24 hours) at 04/04/2021 9326 Last data  filed at 04/04/2021 0522 Gross per 24 hour  Intake 480 ml  Output  1090 ml  Net -610 ml   Filed Weights   04/01/21 0441 04/03/21 0529 04/04/21 0610  Weight: (!) 140.1 kg (!) 140.4 kg (!) 138.7 kg    Examination:  Mallampati 4 no icterus no pallor More coherent S1-S2 no murmur sinus tachycardia Chest clear Abdomen obese nontender nondistended   Data Reviewed: personally reviewed   Data Potassium 3.3-->3.8  white count down to 16 Hemoglobin 10.7 platelet 252  Radiology Studies: EEG adult  Result Date: April 06, 2021 Theodosia Blender, MD     04-06-2021  8:02 PM [facility] EEG HISTORY:  65 year old female presents with altered mental status. INTRODUCTION:  A digital EEG was performed in the laboratory using the standard international 10/20 system of electrode placement in addition to one channel of EKG monitoring.  Hyperventilation was not performed. Photic Stimulation was performed.  This tracing captures wakefulness through drowsiness. This EEG was recording for about 20 minutes. DESCRIPTION OF RECORD:  In the most alert state the alpha rhythm is 9 Hz in frequency, which is seen in the occipital region and attenuates with eye opening and is bilaterally synchronous and symmetrical.  No focal slowing, sharp waves, or epileptiform discharges are seen. Photic stimulation was performed and did not produce any abnormalities. Heart rate was irregular at a rate of 120 bpm. IMPRESSION:  This is a normal adult EEG in the awake and drowsy states. No focal slowing, focal abnormalities, epileptiform discharges, or electrographic seizures are seen.  Of note, a normal EEG does not exclude the diagnosis of seizure disorder.  A repeat sleep deprived EEG or 24 hour EEG could be considered.  The abnormality on single lead EKG could be further evaluated by 12 lead EKG.  Clinical correlation is recommended.     Scheduled Meds: . aspirin  81 mg Oral Daily  . chlorhexidine  15 mL Mouth Rinse BID  . Chlorhexidine Gluconate Cloth  6 each Topical Daily  .  diltiazem  180 mg Oral Daily  . enoxaparin (LOVENOX) injection  0.5 mg/kg Subcutaneous Q24H  . famotidine  20 mg Oral QHS  . feeding supplement  237 mL Oral TID BM  . insulin aspart  0-6 Units Subcutaneous TID WC  . lidocaine (PF)  5 mL Other Once  . [START ON 04/05/2021] losartan  50 mg Oral Daily  . mouth rinse  15 mL Mouth Rinse q12n4p  . potassium chloride  40 mEq Oral Daily  . risperiDONE  3 mg Oral QHS   Continuous Infusions:   LOS: 9 days   Time spent: 35 minutes  Nita Sells, MD [facility] To contact the attending provider between 7A-7P or the covering provider during after hours 7P-7A, please log into the web site [email or web address] and access using universal [place] password for that web site. If you do not have the password, please call the hospital operator.  04/04/2021, 9:38 AM

## 2021-04-04 NOTE — Progress Notes (Signed)
   04/04/21 2030  Assess: MEWS Score  Temp 98.5 F (36.9 C)  BP 111/66  Pulse Rate (!) 114  Resp 19  Level of Consciousness Alert  SpO2 100 %  O2 Device Room Air  Patient Activity (if Appropriate) In bed  Assess: MEWS Score  MEWS Temp 0  MEWS Systolic 0  MEWS Pulse 2  MEWS RR 0  MEWS LOC 0  MEWS Score 2  MEWS Score Color Yellow  Assess: if the MEWS score is Yellow or Red  Were vital signs taken at a resting state? Yes  Focused Assessment No change from prior assessment  Early Detection of Sepsis Score *See Row Information* Low  MEWS guidelines implemented *See Row Information* No, other (Comment) (Yellow - baseline for this patient)  Treat  MEWS Interventions Escalated (See documentation below)  Pain Scale 0-10  Pain Score 0  Escalate  MEWS: Escalate Yellow: discuss with charge nurse/RN and consider discussing with provider and RRT  Notify: Charge Nurse/RN  Name of Charge Nurse/RN Notified [place], RN  Date Charge Nurse/RN Notified 04/04/21  Time Charge Nurse/RN Notified 2047

## 2021-04-04 NOTE — Plan of Care (Addendum)
Called report - pt transferring to 234.  Informed sister Charlann Noss and daughter, [name] of the transfer.

## 2021-04-04 NOTE — Progress Notes (Signed)
Pt arrived to unit in bed.  Telemetry box changed but patient not admitted to central monitoring.    Pt admitted to telemetry.  Pt sluggish with delayed non-sensecal responses.  Pt oriented to self only.  Pt denies pain.  MEWS score Green, transferring RN reports patient MEWS is normally yellow.    Foley catheter - rationale unclear for continuation of catheter.      Sitter at bedside.

## 2021-04-04 NOTE — Progress Notes (Signed)
   04/03/21 2349  Assess: MEWS Score  Temp 99 F (37.2 C)  BP 136/61  Pulse Rate (!) 130  Resp 20  SpO2 97 %  O2 Device Room Air  Assess: MEWS Score  MEWS Temp 0  MEWS Systolic 0  MEWS Pulse 3  MEWS RR 0  MEWS LOC 0  MEWS Score 3  MEWS Score Color Yellow  Assess: if the MEWS score is Yellow or Red  Were vital signs taken at a resting state? No  Focused Assessment No change from prior assessment  Early Detection of Sepsis Score *See Row Information* Medium  MEWS guidelines implemented *See Row Information* No, previously yellow, continue vital signs every 4 hours  Treat  MEWS Interventions Escalated (See documentation below)  Escalate  MEWS: Escalate Yellow: discuss with charge nurse/RN and consider discussing with provider and RRT  Notify: Charge Nurse/RN  Name of Charge Nurse/RN Notified Location manager, RN  Date Charge Nurse/RN Notified 04/03/21  Time Charge Nurse/RN Notified 2355  Notify: Provider  Provider Name/Title Eugenie Norrie, RN  Date Provider Notified 04/04/21  Time Provider Notified [phone]  Notification Type Page  Notification Reason Other (Comment) (pt was combative and earlier interventions unsuccessful)  Provider response See new orders  Date of Provider Response 04/04/21  Time of Provider Response [phone]  Document  Progress note created (see row info) Yes   Pt not only has elevated HR which she has had past 3 nights, but much more agitated and combative. Reached out to [facility] and she rounded with rapid response nurse.

## 2021-04-04 NOTE — Consult Note (Signed)
[facility] Consult   Reason for Consult: Follow-up for 65 year old woman with altered mental status compounded by medical problems Referring Physician: Samtani Patient Identification: Megan Oneal MRN:  [id number] Principal Diagnosis: Psychosis [facility]) Diagnosis:  Principal Problem:   Psychosis ([place]) Active Problems:   Acute encephalopathy   Total Time spent with patient: 30 minutes  Subjective:   Megan Oneal is a 65 y.o. female patient admitted with "I guess I am alright".  HPI: Follow-up on this patient with auditory hallucinations.  Yesterday afternoon when I saw her she had seemed to be doing much better and was probably preparing for discharge soon.  Last night she had a bad episode of agitation and delirium in which she became agitated and aggressive with staff.  Rapid response was called and she was moved to a different unit.  Patient today says she has no memory of that at all.  She denies any mood symptoms.  She says the hallucinations are still present but are much better which is the same thing she said the last couple days.  Looks like she has been up today and moving around.  Patient does not have any new specific complaints.  Looks a little more down and possibly irritated today.  I was asked to give her daughter a call and I did return the call to the number I was given but there was no answer.  I left a message.  Past Psychiatric History: See previous notes.  Minimal past history  Risk to Self:   Risk to Others:   Prior Inpatient Therapy:   Prior Outpatient Therapy:    Past Medical History:  Past Medical History:  Diagnosis Date  . Anxiety 03/12/2013  . Degenerative disc disease, lumbar    Epidural injections in 2007  . Diabetes mellitus without complication ([place])   . Endometriosis   . Frequent PVCs    12,000/24 hours  . Hypertension   . Otalgia, unspecified   . Palpitations 10/04/2010  . Vascular bruit 01/2013   Right neck    Past Surgical  History:  Procedure Laterality Date  . BREAST SURGERY     benign tumor excised-age 70  . CARDIOVASCULAR STRESS TEST  03/13/2008   No scintigraphic evidence of inducible myocardial ischemia, EKG negative for ischemia, no ECG changes.  . COLONOSCOPY  2009  . COLONOSCOPY N/A 09/21/2019   Procedure: COLONOSCOPY;  Surgeon: Rogene Houston, MD;  Location: [facility];  Service: Endoscopy;  Laterality: N/A;  2:00-10:30  . CYST EXCISION  2007   Scalp  . DOPPLER ECHOCARDIOGRAPHY  03/13/2008   EF >27%, LV systolic function is normal, LV wall function is normal  . LAPAROSCOPIC CHOLECYSTECTOMY  2010  . LUMBAR LAMINECTOMY  2009    L4-L5 and L5-S1 laminectomy with diskectomy; transforaminal  . POLYPECTOMY  09/21/2019   Procedure: POLYPECTOMY;  Surgeon: Rogene Houston, MD;  Location: [facility];  Service: Endoscopy;;  . REPLACEMENT TOTAL KNEE    . SLEEP STUDY  10/29/2009   AHI-1.8/hr, during REM 6.4/hr; RDI-8.0/hr, during REM 15.0/hr; avg oxygen during REM and NREM was 94%  . TOTAL KNEE ARTHROPLASTY Bilateral   . TUBAL LIGATION     Family History:  Family History  Problem Relation Age of Onset  . Diabetes Mother   . Hypertension Mother   . Diabetes Brother   . Hypertension Brother   . Hypertension Father    Family Psychiatric  History: See previous Social History:  Social History  Substance and Sexual Activity  Alcohol Use No     Social History   Substance and Sexual Activity  Drug Use No    Social History   Socioeconomic History  . Marital status: Divorced    Spouse name: Not on file  . Number of children: 3  . Years of education: Not on file  . Highest education level: Not on file  Occupational History  . Not on file  Tobacco Use  . Smoking status: Former Smoker    Packs/day: 2.00    Years: 20.00    Pack years: 40.00    Types: Cigarettes    Start date: 08/03/1972    Quit date: 08/03/1996    Years since quitting: 24.6  . Smokeless tobacco: Former Systems developer    Quit date:  12/21/1994  Substance and Sexual Activity  . Alcohol use: No  . Drug use: No  . Sexual activity: Not Currently    Birth control/protection: Post-menopausal  Other Topics Concern  . Not on file  Social History Narrative  . Not on file   Social Determinants of Health   Financial Resource Strain: Not on file  Food Insecurity: Not on file  Transportation Needs: Not on file  Physical Activity: Not on file  Stress: Not on file  Social Connections: Not on file   Additional Social History:    Allergies:   Allergies  Allergen Reactions  . Metoprolol Other (See Comments)    Fatigue and malaise; wheezing  . Sulfa Antibiotics     Labs:  Results for orders placed or performed during the hospital encounter of 03/26/21 (from the past 48 hour(s))  Glucose, capillary     Status: None   Collection Time: 04/02/21  8:38 PM  Result Value Ref Range   Glucose-Capillary 76 70 - 99 mg/dL    Comment: Glucose reference range applies only to samples taken after fasting for at least 8 hours.   Comment 1 Notify RN   Glucose, capillary     Status: Abnormal   Collection Time: 04/03/21  3:06 AM  Result Value Ref Range   Glucose-Capillary 105 (H) 70 - 99 mg/dL    Comment: Glucose reference range applies only to samples taken after fasting for at least 8 hours.   Comment 1 Notify RN   CBC with Differential/Platelet     Status: Abnormal   Collection Time: 04/03/21  4:09 AM  Result Value Ref Range   WBC 16.3 (H) 4.0 - 10.5 K/uL   RBC 3.76 (L) 3.87 - 5.11 MIL/uL   Hemoglobin 10.2 (L) 12.0 - 15.0 g/dL   HCT 32.7 (L) 36.0 - 46.0 %   MCV 87.0 80.0 - 100.0 fL   MCH 27.1 26.0 - 34.0 pg   MCHC 31.2 30.0 - 36.0 g/dL   RDW 15.6 (H) 11.5 - 15.5 %   Platelets 233 150 - 400 K/uL   nRBC 0.0 0.0 - 0.2 %   Neutrophils Relative % 63 %   Neutro Abs 10.3 (H) 1.7 - 7.7 K/uL   Lymphocytes Relative 21 %   Lymphs Abs 3.5 0.7 - 4.0 K/uL   Monocytes Relative 10 %   Monocytes Absolute 1.6 (H) 0.1 - 1.0 K/uL    Eosinophils Relative 4 %   Eosinophils Absolute 0.7 (H) 0.0 - 0.5 K/uL   Basophils Relative 0 %   Basophils Absolute 0.0 0.0 - 0.1 K/uL   WBC Morphology MORPHOLOGY UNREMARKABLE    RBC Morphology MORPHOLOGY UNREMARKABLE    Smear Review  Normal platelet morphology    Immature Granulocytes 2 %   Abs Immature Granulocytes 0.27 (H) 0.00 - 0.07 K/uL    Comment: Performed at [facility], [address] [place], [place] [address]  Basic metabolic panel     Status: Abnormal   Collection Time: 04/03/21  4:09 AM  Result Value Ref Range   Sodium 143 135 - 145 mmol/L   Potassium 3.3 (L) 3.5 - 5.1 mmol/L   Chloride 108 98 - 111 mmol/L   CO2 26 22 - 32 mmol/L   Glucose, Bld 93 70 - 99 mg/dL    Comment: Glucose reference range applies only to samples taken after fasting for at least 8 hours.   BUN 10 8 - 23 mg/dL   Creatinine, Ser 0.77 0.44 - 1.00 mg/dL   Calcium 8.3 (L) 8.9 - 10.3 mg/dL   GFR, Estimated >60 >60 mL/min    Comment: (NOTE) Calculated using the CKD-EPI Creatinine Equation (2021)    Anion gap 9 5 - 15    Comment: Performed at [facility], [address] [place], [place] [address]  Magnesium     Status: None   Collection Time: 04/03/21  4:09 AM  Result Value Ref Range   Magnesium 2.1 1.7 - 2.4 mg/dL    Comment: Performed at [facility], [address] [place], [place] [address]  Glucose, capillary     Status: None   Collection Time: 04/03/21  8:20 AM  Result Value Ref Range   Glucose-Capillary 92 70 - 99 mg/dL    Comment: Glucose reference range applies only to samples taken after fasting for at least 8 hours.  Glucose, capillary     Status: None   Collection Time: 04/03/21 12:08 PM  Result Value Ref Range   Glucose-Capillary 97 70 - 99 mg/dL    Comment: Glucose reference range applies only to samples taken after fasting for at least 8 hours.  Glucose, capillary     Status: None   Collection Time: 04/03/21  4:40 PM  Result Value Ref Range    Glucose-Capillary 84 70 - 99 mg/dL    Comment: Glucose reference range applies only to samples taken after fasting for at least 8 hours.  Glucose, capillary     Status: Abnormal   Collection Time: 04/03/21  8:36 PM  Result Value Ref Range   Glucose-Capillary 104 (H) 70 - 99 mg/dL    Comment: Glucose reference range applies only to samples taken after fasting for at least 8 hours.  CBC with Differential/Platelet     Status: Abnormal   Collection Time: 04/04/21  6:21 AM  Result Value Ref Range   WBC 16.9 (H) 4.0 - 10.5 K/uL   RBC 3.93 3.87 - 5.11 MIL/uL   Hemoglobin 10.7 (L) 12.0 - 15.0 g/dL   HCT 33.0 (L) 36.0 - 46.0 %   MCV 84.0 80.0 - 100.0 fL   MCH 27.2 26.0 - 34.0 pg   MCHC 32.4 30.0 - 36.0 g/dL   RDW 15.3 11.5 - 15.5 %   Platelets 252 150 - 400 K/uL    Comment: Immature Platelet Fraction may be clinically indicated, consider ordering this additional test [id number] PLATELET COUNT CONFIRMED BY SMEAR    nRBC 0.0 0.0 - 0.2 %   Neutrophils Relative % 65 %   Neutro Abs 10.9 (H) 1.7 - 7.7 K/uL   Lymphocytes Relative 16 %   Lymphs Abs 2.7 0.7 - 4.0 K/uL   Monocytes Relative 13 %  Monocytes Absolute 2.2 (H) 0.1 - 1.0 K/uL   Eosinophils Relative 3 %   Eosinophils Absolute 0.5 0.0 - 0.5 K/uL   Basophils Relative 0 %   Basophils Absolute 0.1 0.0 - 0.1 K/uL   WBC Morphology MILD LEFT SHIFT (1-5% METAS, OCC MYELO, OCC BANDS)    RBC Morphology MORPHOLOGY UNREMARKABLE    Smear Review Normal platelet morphology    Immature Granulocytes 3 %   Abs Immature Granulocytes 0.55 (H) 0.00 - 0.07 K/uL    Comment: Performed at [facility], [address], [place], [place] [address]  Basic metabolic panel     Status: Abnormal   Collection Time: 04/04/21  6:21 AM  Result Value Ref Range   Sodium 141 135 - 145 mmol/L   Potassium 3.8 3.5 - 5.1 mmol/L   Chloride 106 98 - 111 mmol/L   CO2 25 22 - 32 mmol/L   Glucose, Bld 102 (H) 70 - 99 mg/dL    Comment: Glucose reference range  applies only to samples taken after fasting for at least 8 hours.   BUN 9 8 - 23 mg/dL   Creatinine, Ser 0.78 0.44 - 1.00 mg/dL   Calcium 8.4 (L) 8.9 - 10.3 mg/dL   GFR, Estimated >60 >60 mL/min    Comment: (NOTE) Calculated using the CKD-EPI Creatinine Equation (2021)    Anion gap 10 5 - 15    Comment: Performed at [facility], [address] [place], [place] [address]  Magnesium     Status: None   Collection Time: 04/04/21  6:21 AM  Result Value Ref Range   Magnesium 2.2 1.7 - 2.4 mg/dL    Comment: Performed at [facility], [address] [place], [place] [address]  Glucose, capillary     Status: Abnormal   Collection Time: 04/04/21 12:16 PM  Result Value Ref Range   Glucose-Capillary 120 (H) 70 - 99 mg/dL    Comment: Glucose reference range applies only to samples taken after fasting for at least 8 hours.  Glucose, capillary     Status: Abnormal   Collection Time: 04/04/21  4:57 PM  Result Value Ref Range   Glucose-Capillary 102 (H) 70 - 99 mg/dL    Comment: Glucose reference range applies only to samples taken after fasting for at least 8 hours.    Current Facility-Administered Medications  Medication Dose Route Frequency Provider Last Rate Last Admin  . aspirin chewable tablet 81 mg  81 mg Oral Daily Tyler Pita, MD   81 mg at 04/04/21 6270  . chlorhexidine (PERIDEX) 0.12 % solution 15 mL  15 mL Mouth Rinse BID Tyler Pita, MD   15 mL at 04/04/21 0904  . Chlorhexidine Gluconate Cloth 2 % PADS 6 each  6 each Topical Daily Flora Lipps, MD   6 each at 04/03/21 2050  . diltiazem (CARDIZEM CD) 24 hr capsule 180 mg  180 mg Oral Daily Nita Sells, MD   180 mg at 04/04/21 0953  . enoxaparin (LOVENOX) injection 72.5 mg  0.5 mg/kg Subcutaneous Q24H Flora Lipps, MD   72.5 mg at 04/04/21 0904  . famotidine (PEPCID) tablet 20 mg  20 mg Oral QHS Dallie Piles, RPH   20 mg at 04/03/21 2049  . feeding supplement (ENSURE ENLIVE / ENSURE PLUS)  liquid 237 mL  237 mL Oral TID BM Sreenath, Sudheer B, MD   237 mL at 04/03/21 2050  . haloperidol lactate (HALDOL) injection 1-2 mg  1-2 mg  Intramuscular Q6H PRN Mansy, Jan A, MD      . hydrALAZINE (APRESOLINE) injection 10-20 mg  10-20 mg Intravenous Q4H PRN Awilda Bill, NP   10 mg at 03/29/21 1626  . ibuprofen (ADVIL) tablet 600 mg  600 mg Oral Q6H PRN Rust-Chester, Britton L, NP   600 mg at 04/01/21 2331  . insulin aspart (novoLOG) injection 0-6 Units  0-6 Units Subcutaneous TID WC Samtani, Jai-Gurmukh, MD      . LORazepam (ATIVAN) injection 0.5 mg  0.5 mg Intravenous Q4H PRN Mansy, Jan A, MD      . Derrill Memo ON 04/05/2021] losartan (COZAAR) tablet 50 mg  50 mg Oral Daily Nita Sells, MD      . MEDLINE mouth rinse  15 mL Mouth Rinse q12n4p Tyler Pita, MD   15 mL at 04/03/21 1651  . metoprolol tartrate (LOPRESSOR) injection 5 mg  5 mg Intravenous Q6H PRN Nita Sells, MD   5 mg at 04/04/21 1355  . ondansetron (ZOFRAN) injection 4 mg  4 mg Intravenous Q6H PRN Awilda Bill, NP      . polyethylene glycol (MIRALAX / GLYCOLAX) packet 17 g  17 g Oral Daily PRN Tyler Pita, MD      . risperiDONE (RISPERDAL) tablet 3 mg  3 mg Oral QHS Samtani, Jai-Gurmukh, MD      . verapamil (CALAN) tablet 40 mg  40 mg Oral Q12H Nita Sells, MD        Musculoskeletal: Strength & Muscle Tone: within normal limits Gait & Station: normal Patient leans: N/A            Psychiatric Specialty Exam:  Presentation  General Appearance: Casual  Eye Contact:Good  Speech:Clear and Coherent; Normal Rate  Speech Volume:Normal  Handedness:Right   Mood and Affect  Mood:Anxious  Affect:Congruent   Thought Process  Thought Processes:Coherent  Descriptions of Associations:Intact  Orientation:Full (Time, Place and Person)  Thought Content:Rumination  History of Schizophrenia/Schizoaffective disorder:No  Duration of Psychotic Symptoms:Less than six  months  Hallucinations:No data recorded Ideas of Reference:Paranoia  Suicidal Thoughts:No data recorded Homicidal Thoughts:No data recorded  Sensorium  Memory:Immediate Good; Remote Good; Recent Good  Judgment:Intact  Insight:Fair   Executive Functions  Concentration:Fair  Attention Span:Fair  [place]   Psychomotor Activity  Psychomotor Activity:No data recorded  Assets  Assets:Communication Skills; Desire for Improvement; Financial Resources/Insurance; Housing; Social Support; Resilience   Sleep  Sleep:No data recorded  Physical Exam: Physical Exam Vitals and nursing note reviewed.  Constitutional:      Appearance: Normal appearance.  HENT:     Head: Normocephalic and atraumatic.     Mouth/Throat:     Pharynx: Oropharynx is clear.  Eyes:     Pupils: Pupils are equal, round, and reactive to light.  Cardiovascular:     Rate and Rhythm: Normal rate and regular rhythm.  Pulmonary:     Effort: Pulmonary effort is normal.     Breath sounds: Normal breath sounds.  Abdominal:     General: Abdomen is flat.     Palpations: Abdomen is soft.  Musculoskeletal:        General: Normal range of motion.  Skin:    General: Skin is warm and dry.  Neurological:     General: No focal deficit present.     Mental Status: She is alert. Mental status is at baseline.  Psychiatric:        Attention and Perception: Attention normal.  Mood and Affect: Mood normal. Affect is blunt.        Speech: Speech is delayed.        Behavior: Behavior is slowed.        Thought Content: Thought content normal. Thought content does not include homicidal or suicidal ideation.        Cognition and Memory: Memory is impaired.        Judgment: Judgment normal.    Review of Systems  Constitutional: Negative.   HENT: Negative.   Eyes: Negative.   Respiratory: Negative.   Cardiovascular: Negative.   Gastrointestinal: Negative.    Musculoskeletal: Negative.   Skin: Negative.   Neurological: Negative.   Psychiatric/Behavioral: Negative.    Blood pressure 135/73, pulse (!) 109, temperature 98.8 F (37.1 C), temperature source Oral, resp. rate 20, height 5' 5.98" (1.676 m), weight (!) 138.7 kg, SpO2 96 %. Body mass index is 49.38 kg/m.  Treatment Plan Summary: Plan Patient got more agitated last night.  Communicated with hospitalist today.  I agree with the suggestion of increasing Risperdal to 3 mg at night.  I would recommend not using Ativan or other benzodiazepines if possible at night for agitation.  As needed haloperidol is in place but again should not be overdone because we do not want oversedation.  We are hoping that she will be able to be discharged within the next day.  I will be happy to talk to the family tomorrow if possible.  Disposition: Patient does not meet criteria for psychiatric inpatient admission. Supportive therapy provided about ongoing stressors.  Alethia Berthold, MD 04/04/2021 5:21 PM

## 2021-04-04 NOTE — Progress Notes (Signed)
Patient continues to refuse the heart monitor. Heart rate is 109 with spot check. Will continue to reassess

## 2021-04-04 NOTE — Plan of Care (Signed)
Rapid response nurse and AC came to evaluate patient.  Feels pt is stable but not appropriate for med-surg floor.  Recommends she be transferred to 2A.

## 2021-04-04 NOTE — Progress Notes (Signed)
Occupational Therapy Treatment Patient Details Name: Megan Oneal MRN: [id number] DOB: Apr 22, 1956 Today's Date: 04/04/2021    History of present illness Pt is a 65 y/o female presenting with severe encephalopathy in the context of severe hypoxic respiratory failure requiring intubation. Underlying etiology most likely secondary to medication overdose at home; suspect Flexeril. PMH: HTN, DM2, DDD, anxiety, depression   OT comments  Pt seen for OT treatment this date to f/u re: safety with ADLs/ADL mobility. Pt agreeable to toileting and grooming task participation. OT engages pt in STS with RW with SUPV for safety cueing as pt demos poor safety awareness and poor awareness of recall for previous safety education with therapy. OT engages pt in SUPV fxl mobility to/from restroom with MIN verbal cues to safely negotiate pathway. Pt requires CGA for commode transfer and MIN A for clothing mgt over hips. She is able to perform standing hygiene tasks with SETUP and SUPV for standing safety. Pt returned to chair and sitter in room throughout. Pt left with all needs met and in reach, will continue to follow.    Follow Up Recommendations  SNF    Equipment Recommendations  3 in 1 bedside commode    Recommendations for Other Services      Precautions / Restrictions Precautions Precautions: Fall Restrictions Weight Bearing Restrictions: No       Mobility Bed Mobility               General bed mobility comments: pt received and left up in chair    Transfers Overall transfer level: Needs assistance Equipment used: Rolling walker (2 wheeled) (bari) Transfers: Sit to/from Stand Sit to Stand: Supervision         General transfer comment: SUPV for safety cues, hand placement    Balance Overall balance assessment: Needs assistance Sitting-balance support: Feet supported Sitting balance-Leahy Scale: Good     Standing balance support: No upper extremity supported;During functional  activity Standing balance-Leahy Scale: Fair                             ADL either performed or assessed with clinical judgement   ADL Overall ADL's : Needs assistance/impaired     Grooming: Wash/dry hands;Wash/dry face;Oral care;Set up;Cueing for sequencing;Standing Grooming Details (indicate cue type and reason): sink-side with RW for support             Lower Body Dressing: Minimal assistance;Sit to/from stand Lower Body Dressing Details (indicate cue type and reason): clothing mgt over hips before/after toileting Toilet Transfer: Min guard;RW;Grab bars;Ambulation;Cueing for safety   Toileting- Clothing Manipulation and Hygiene: Min guard;Sit to/from stand       Functional mobility during ADLs: Supervision/safety;Rolling walker (to from restroom, cues for safety)       Vision Patient Visual Report: No change from baseline     Perception     Praxis      Cognition Arousal/Alertness: Awake/alert Behavior During Therapy: WFL for tasks assessed/performed Overall Cognitive Status: No family/caregiver present to determine baseline cognitive functioning Area of Impairment: Orientation;Memory;Attention;Safety/judgement;Following commands;Problem solving;Awareness                 Orientation Level: Disoriented to;Time;Situation Current Attention Level: Alternating Memory: Decreased recall of precautions;Decreased short-term memory Following Commands: Follows one step commands inconsistently;Follows one step commands with increased time Safety/Judgement: Decreased awareness of safety;Decreased awareness of deficits Awareness: Intellectual Problem Solving: Slow processing;Decreased initiation;Difficulty sequencing;Requires verbal cues;Requires tactile cues General Comments: Pt alert and  participatory. Able to follow simple one step commands, but still disoriented and does require some repetion of safety cues. Noted to have poor awareness.         Exercises Other Exercises Other Exercises: OT facilitates pt participation in standing grooming tasks with SETUP and cues to seuqence as well as SUPV for standing safety.balance. Pt requires SUPV for commode transfers and MIN A for clothing mgt over hips.   Shoulder Instructions       General Comments      Pertinent Vitals/ Pain       Pain Assessment: No/denies pain  Home Living                                          Prior Functioning/Environment              Frequency  Min 1X/week        Progress Toward Goals  OT Goals(current goals can now be found in the care plan section)  Progress towards OT goals: Progressing toward goals  Acute Rehab OT Goals Patient Stated Goal: none stated OT Goal Formulation: With patient Time For Goal Achievement: 04/14/21 Potential to Achieve Goals: Good  Plan Discharge plan remains appropriate;Frequency remains appropriate    Co-evaluation                 AM-PAC OT "6 Clicks" Daily Activity     Outcome Measure   Help from another person eating meals?: A Little Help from another person taking care of personal grooming?: A Little Help from another person toileting, which includes using toliet, bedpan, or urinal?: A Little Help from another person bathing (including washing, rinsing, drying)?: A Lot Help from another person to put on and taking off regular upper body clothing?: A Little Help from another person to put on and taking off regular lower body clothing?: A Lot 6 Click Score: 16    End of Session Equipment Utilized During Treatment: Rolling walker  OT Visit Diagnosis: Unsteadiness on feet (R26.81);Other abnormalities of gait and mobility (R26.89);Muscle weakness (generalized) (M62.81)   Activity Tolerance Patient tolerated treatment well   Patient Left in chair;with call bell/phone within reach;with chair alarm set;with nursing/sitter in room   Nurse Communication          Time:  8309-4076 OT Time Calculation (min): 23 min  Charges: OT General Charges $OT Visit: 1 Visit OT Treatments $Self Care/Home Management : 8-22 mins $Therapeutic Activity: 8-22 mins  Gerrianne Scale, [place], OTR/L ascom [phone] 04/04/21, 5:09 PM

## 2021-04-05 DIAGNOSIS — F29 Unspecified psychosis not due to a substance or known physiological condition: Secondary | ICD-10-CM | POA: Diagnosis not present

## 2021-04-05 LAB — COMPREHENSIVE METABOLIC PANEL
ALT: 32 U/L (ref 0–44)
AST: 25 U/L (ref 15–41)
Albumin: 2.8 g/dL — ABNORMAL LOW (ref 3.5–5.0)
Alkaline Phosphatase: 49 U/L (ref 38–126)
Anion gap: 9 (ref 5–15)
BUN: 8 mg/dL (ref 8–23)
CO2: 24 mmol/L (ref 22–32)
Calcium: 8.1 mg/dL — ABNORMAL LOW (ref 8.9–10.3)
Chloride: 106 mmol/L (ref 98–111)
Creatinine, Ser: 0.71 mg/dL (ref 0.44–1.00)
GFR, Estimated: >60 mL/min (ref >60)
Glucose, Bld: 111 mg/dL — ABNORMAL HIGH (ref 70–99)
Potassium: 3.1 mmol/L — ABNORMAL LOW (ref 3.5–5.1)
Sodium: 139 mmol/L (ref 135–145)
Total Bilirubin: 0.7 mg/dL (ref 0.3–1.2)
Total Protein: 6 g/dL — ABNORMAL LOW (ref 6.5–8.1)

## 2021-04-05 LAB — GLUCOSE, CAPILLARY
Glucose-Capillary: 113 mg/dL — ABNORMAL HIGH (ref 70–99)
Glucose-Capillary: 103 mg/dL — ABNORMAL HIGH (ref 70–99)
Glucose-Capillary: 109 mg/dL — ABNORMAL HIGH (ref 70–99)
Glucose-Capillary: 114 mg/dL — ABNORMAL HIGH (ref 70–99)

## 2021-04-05 LAB — AMMONIA: Ammonia: 10 umol/L (ref 9–35)

## 2021-04-05 MED ORDER — POTASSIUM CHLORIDE CRYS ER 20 MEQ PO TBCR
40.0000 meq | EXTENDED_RELEASE_TABLET | Freq: Two times a day (BID) | ORAL | Status: DC
  Administered 2021-04-05 – 2021-04-10 (×10): 40 meq via ORAL
  Filled 2021-04-05 (×10): qty 2

## 2021-04-05 MED ORDER — RISPERIDONE 1 MG PO TABS
2.0000 mg | ORAL_TABLET | Freq: Every day | ORAL | Status: DC
  Administered 2021-04-05 – 2021-04-09 (×5): 2 mg via ORAL
  Filled 2021-04-05 (×8): qty 2

## 2021-04-05 NOTE — Progress Notes (Signed)
PROGRESS NOTE   Megan Oneal  UYQ:[id number] DOB: [birth year] DOA: 03/26/2021 PCP: Sharilyn Sites, MD  Brief Narrative:  65 year old white female admitted [facility] 03/26/2021 unresponsive LK and a 6:30 AM that time-family found a bottle of Flexeril next patient had presented previously 03/24/2021 To [facility] auditory hallucinations-evaluated by psychiatry-Rx Risperdal and told to follow-up with PCP  Tachycardic-bibasilar atelectasis on arrival-CT head no acute abnormalities Tylenol level less than 10, salicylate level less than 7.0 UDS = + tricyclics, VBG 6.3/87/56  Patient intubated/sedated-LP unsuccessful-Rx acyclovir vancomycin Rocephin--CSF cult negative Extubated 4/9 with intermittent delusional thoughts MRI brain no acute findings EEG no seizures-generalized dysfunction Eventually psychiatry consulted/neurology consulted  Psychiatry reconsulted 04/02/2021-they felt potential steroid induced delirium might of contributed to some of her confusion and changed her Haldol to every 6 hours 1 mg only They also recommended continuation of Risperdal 2 mg, auditory hallucinations were probably secondary to psychotic depression and they discontinued her Depakote   Hospital-Problem based course  Acute toxic metabolic encephalopathy 2/2?  Flexeril with persistence of encephalopathy Patient more somnolent 4/16 Intermittent bouts of agitation and aggression 4/14-psychiatry reconsulted-- Somnolent today 4/16-cut back from 3 mg to risperdal 2 EEG negative for focal seizures I will rule out hypercarbia hyperammonemia and get labs now and follow-up this evening Auditory hallucinations followed previously [employer] of otosclerosis Prior history PTSD Will need outpatient [facility] follow-up risperdal 2mg --no Depakote 250 discontinued by psychiatry As needed Haldol/Ativan if very agitated-try not to give if possible Has safety sitter Acute hypoxic respiratory failure-extubated 4/9 No oxygen  requirement currently CAD/HTN Cut back losartan from 150 Start Cardizem 180 CD discontinue 3 times daily Cardizem Verapamil [this is a PTA med] started at low-dose 4/16 Lasix every other day on hold Leukocytosis secondary to Decadron Meningitis diagnosis entertained on admission-all cultures however negative Patient seen by infectious disease No further work-up and they have signed off 4/11 Hypokalemia Monitor trends BMI 49.9 Life-threatening-outpatient discussion   DVT prophylaxis: Lovenox Code Status: Full Family Communication:  No family present today at the bedside- barriers to discharge include authorizations from insurances per discussion with social work Disposition:  Status is: Inpatient  Remains inpatient appropriate because:Unsafe d/c plan   Dispo: The patient is from: Home              Anticipated d/c is to: SNF              Patient currently is medically stable to d/c.   Difficult to place patient No     Consultants:   Infectious disease  Neurology  Procedures:  Lumbar puncture attempted  Antimicrobials: None   Subjective:  Very somnolent Did not really eat today only had 1 packet of chips for lunch Apparently worked with therapy but was very lethargic  Objective: Vitals:   04/05/21 0500 04/05/21 0609 04/05/21 0818 04/05/21 1136  BP:  (!) 101/54 121/75 (!) 153/78  Pulse:  97 (!) 120 (!) 104  Resp:  18 20 20   Temp:   97.9 F (36.6 C) 98.5 F (36.9 C)  TempSrc:   Oral Oral  SpO2:  96% 100% 99%  Weight: (!) 140 kg     Height:        Intake/Output Summary (Last 24 hours) at 04/05/2021 1320 Last data filed at 04/05/2021 1011 Gross per 24 hour  Intake 480 ml  Output --  Net 480 ml   Filed Weights   04/03/21 0529 04/04/21 0610 04/05/21 0500  Weight: (!) 140.4 kg (!) 138.7 kg Marland Kitchen)  140 kg    Examination:  Sleepy hard to arouse No chest pain no fever S1-S2 no murmur no rub no gallop Abdomen obese nontender no rebound Lower extremity  edema present   Data Reviewed: personally reviewed   No Labs today  Radiology Studies: No results found.   Scheduled Meds: . aspirin  81 mg Oral Daily  . chlorhexidine  15 mL Mouth Rinse BID  . Chlorhexidine Gluconate Cloth  6 each Topical Daily  . diltiazem  180 mg Oral Daily  . enoxaparin (LOVENOX) injection  0.5 mg/kg Subcutaneous Q24H  . famotidine  20 mg Oral QHS  . feeding supplement  237 mL Oral TID BM  . insulin aspart  0-6 Units Subcutaneous TID WC  . losartan  50 mg Oral Daily  . mouth rinse  15 mL Mouth Rinse q12n4p  . risperiDONE  3 mg Oral QHS  . verapamil  40 mg Oral Q12H   Continuous Infusions:   LOS: 10 days   Time spent: 15 minutes  Nita Sells, MD [facility] To contact the attending provider between 7A-7P or the covering provider during after hours 7P-7A, please log into the web site [email or web address] and access using universal [place] password for that web site. If you do not have the password, please call the hospital operator.  04/05/2021, 1:20 PM

## 2021-04-05 NOTE — Progress Notes (Signed)
2005 - received pt asleep, arousable by touch, answered question appropriately, with sitter at bedside. Per  day shift RN, pt refused telemetry equipment use, per pt it gives her "shock". 0500 - pt's vital signs were taken, still drowsy, responds to voice, responds to questions appropriately, verbalized no pain just sleepy. Initiated getting up to use restroom, pt refused, verbalized will do it later.

## 2021-04-05 NOTE — Progress Notes (Signed)
Physical Therapy Treatment Patient Details Name: Megan Oneal MRN: [id number] DOB: [birth year] Today's Date: 04/05/2021    History of Present Illness Pt is a 65 y/o female presenting with severe encephalopathy in the context of severe hypoxic respiratory failure requiring intubation. Underlying etiology most likely secondary to medication overdose at home; suspect Flexeril. PMH: HTN, DM2, DDD, anxiety, depression    PT Comments    Pt transitioned to higher level of care with new PT orders.  Pt seen for PT treatment with pt lethargic but PT able to awaken her with extra time, which is an improvement compared to when MD assessed pt 30 minutes prior, per sitter. Pt is aware she's in the hospital & of the year but unaware of situation with PT orienting her. Pt is able to sit EOB with min assist but reports not feeling well, unable to elaborate, and eyes closing. BP 149/65 mmHg RUE (MAP 84), HR 82 bpm. Pt requesting to lie back down but stands & takes a few steps to [facility] with min assist HHA +1 prior to doing so. Pt left in bed in care of sitter & nurse notified of pt's behaviors during session.  Current goals & plan of care remain appropriate. Will continue to follow pt acutely to progress gait with LRAD, balance, activity tolerance, and safety awareness. Continue to recommend STR upon d/c to maximize independence with functional mobility & reduce fall risk, especially with pt's waxing & waning cognitive & functional abilities.    Follow Up Recommendations  SNF     Equipment Recommendations   (TBD in next venue)    Recommendations for Other Services       Precautions / Restrictions Precautions Precautions: Fall Restrictions Weight Bearing Restrictions: No    Mobility  Bed Mobility Overal bed mobility: Needs Assistance Bed Mobility: Supine to Sit     Supine to sit: Min assist;HOB elevated (pt reports she can't do it but is able to with min assist with pt holding to PT's hands vs bed  rails) Sit to supine: Supervision;HOB elevated        Transfers Overall transfer level: Needs assistance Equipment used: None Transfers: Sit to/from Stand Sit to Stand: Supervision            Ambulation/Gait Ambulation/Gait assistance: Min assist Gait Distance (Feet): 2 Feet Assistive device: 1 person hand held assist   Gait velocity: decreased   General Gait Details: 2 side steps to L at EOB   Stairs             Wheelchair Mobility    Modified Rankin (Stroke Patients Only)       Balance Overall balance assessment: Needs assistance Sitting-balance support: Feet supported Sitting balance-Leahy Scale: Good Sitting balance - Comments: supervision sitting EOB   Standing balance support: Single extremity supported;During functional activity Standing balance-Leahy Scale: Fair                              Cognition Arousal/Alertness: Lethargic Behavior During Therapy: Flat affect Overall Cognitive Status: Difficult to assess Area of Impairment: Orientation;Memory;Attention;Safety/judgement;Following commands;Problem solving;Awareness                 Orientation Level: Disoriented to;Situation (oriented to year & hospital)   Memory: Decreased recall of precautions;Decreased short-term memory Following Commands: Follows one step commands inconsistently;Follows one step commands with increased time Safety/Judgement: Decreased awareness of safety;Decreased awareness of deficits Awareness: Intellectual Problem Solving: Slow processing;Decreased initiation;Difficulty sequencing;Requires verbal  cues;Requires tactile cues General Comments: Requires heavy tactile/verbal stimulation to wake up upon PT arrival, sitter reporting MD recently unable to wake pt even with sternal rub so this is an improvement but MD & nurse are aware      Exercises      General Comments        Pertinent Vitals/Pain Pain Assessment: No/denies pain Faces Pain  Scale: No hurt    Home Living                      Prior Function            PT Goals (current goals can now be found in the care plan section) Acute Rehab PT Goals Patient Stated Goal: none stated PT Goal Formulation: With patient Time For Goal Achievement: 04/19/21 Potential to Achieve Goals: Fair Progress towards PT goals: Progressing toward goals    Frequency    Min 2X/week      PT Plan Current plan remains appropriate    Co-evaluation              AM-PAC PT "6 Clicks" Mobility   Outcome Measure  Help needed turning from your back to your side while in a flat bed without using bedrails?: A Little Help needed moving from lying on your back to sitting on the side of a flat bed without using bedrails?: A Little Help needed moving to and from a bed to a chair (including a wheelchair)?: A Little Help needed standing up from a chair using your arms (e.g., wheelchair or bedside chair)?: A Little Help needed to walk in hospital room?: A Lot Help needed climbing 3-5 steps with a railing? : Total 6 Click Score: 15    End of Session Equipment Utilized During Treatment: Gait belt Activity Tolerance: Patient limited by lethargy Patient left: in bed;with call bell/phone within reach;with bed alarm set;with nursing/sitter in room Nurse Communication:  (lethargy) PT Visit Diagnosis: Unsteadiness on feet (R26.81);Muscle weakness (generalized) (M62.81);Difficulty in walking, not elsewhere classified (R26.2)     Time: 1349-1401 PT Time Calculation (min) (ACUTE ONLY): 12 min  Charges:  $Therapeutic Activity: 8-22 mins                     Lavone Nian, PT, DPT 04/05/21, 2:47 PM    Waunita Schooner 04/05/2021, 2:44 PM

## 2021-04-05 NOTE — Consult Note (Signed)
[facility] Consult   Reason for Consult: Follow-up consult for this 65 year old woman with altered mental status. Referring Physician:  Verlon Au Patient Identification: Megan Oneal MRN:  [id number] Principal Diagnosis: Psychosis [facility]) Diagnosis:  Principal Problem:   Psychosis ([place]) Active Problems:   Acute encephalopathy   Total Time spent with patient: 30 minutes  Subjective:   Megan Oneal is a 65 y.o. female patient admitted with "I do not feel so good".  HPI:   Follow-up on this patient I have been following for hallucinations still of not entirely clear origin.  Yesterday the patient had been sitting up.  Energy seemed a little lower but overall still seems similar to the day before.  Today when I came to see her in the afternoon she was sleeping.  She woke up to her voice but looked very tired and told me that she was feeling bad and tired.  The sitter who is with her tells me that this morning the patient was awake and alert and conversant with her children who were visiting and that it was only around lunchtime that she became much more tired and unresponsive.  She did note that medicine had been provided.  Looking at the chart the only medicine I can see that would have been given anywhere around the lunchtime period Was her Cozaar and not her psychiatric medicines and there is no record of as needed being given.  Patient was able to stay awake and answer a few questions but was sluggish and slow with flat affect.  Says she is still hearing voices but as she has for the last several days tells me they are not as bad as they used to be.  Her memory is poor she did not remember her children having been here earlier today.  Yesterday her Risperdal dose had been increased from 2 mg to 3 mg but she also did not get any as needed medicine since then as far as I can see.  Past Psychiatric History: See previous notes.  Minimal past history.  Some anxiety.  Risk to Self:    Risk to Others:   Prior Inpatient Therapy:   Prior Outpatient Therapy:    Past Medical History:  Past Medical History:  Diagnosis Date  . Anxiety 03/12/2013  . Degenerative disc disease, lumbar    Epidural injections in 2007  . Diabetes mellitus without complication ([place])   . Endometriosis   . Frequent PVCs    12,000/24 hours  . Hypertension   . Otalgia, unspecified   . Palpitations 10/04/2010  . Vascular bruit 01/2013   Right neck    Past Surgical History:  Procedure Laterality Date  . BREAST SURGERY     benign tumor excised-age 53  . CARDIOVASCULAR STRESS TEST  03/13/2008   No scintigraphic evidence of inducible myocardial ischemia, EKG negative for ischemia, no ECG changes.  . COLONOSCOPY  2009  . COLONOSCOPY N/A 09/21/2019   Procedure: COLONOSCOPY;  Surgeon: Rogene Houston, MD;  Location: [facility];  Service: Endoscopy;  Laterality: N/A;  2:00-10:30  . CYST EXCISION  2007   Scalp  . DOPPLER ECHOCARDIOGRAPHY  03/13/2008   EF >63%, LV systolic function is normal, LV wall function is normal  . LAPAROSCOPIC CHOLECYSTECTOMY  2010  . LUMBAR LAMINECTOMY  2009    L4-L5 and L5-S1 laminectomy with diskectomy; transforaminal  . POLYPECTOMY  09/21/2019   Procedure: POLYPECTOMY;  Surgeon: Rogene Houston, MD;  Location: [facility];  Service:  Endoscopy;;  . REPLACEMENT TOTAL KNEE    . SLEEP STUDY  10/29/2009   AHI-1.8/hr, during REM 6.4/hr; RDI-8.0/hr, during REM 15.0/hr; avg oxygen during REM and NREM was 94%  . TOTAL KNEE ARTHROPLASTY Bilateral   . TUBAL LIGATION     Family History:  Family History  Problem Relation Age of Onset  . Diabetes Mother   . Hypertension Mother   . Diabetes Brother   . Hypertension Brother   . Hypertension Father    Family Psychiatric  History: See previous Social History:  Social History   Substance and Sexual Activity  Alcohol Use No     Social History   Substance and Sexual Activity  Drug Use No    Social History    Socioeconomic History  . Marital status: Divorced    Spouse name: Not on file  . Number of children: 3  . Years of education: Not on file  . Highest education level: Not on file  Occupational History  . Not on file  Tobacco Use  . Smoking status: Former Smoker    Packs/day: 2.00    Years: 20.00    Pack years: 40.00    Types: Cigarettes    Start date: 08/03/1972    Quit date: 08/03/1996    Years since quitting: 24.6  . Smokeless tobacco: Former Systems developer    Quit date: 12/21/1994  Substance and Sexual Activity  . Alcohol use: No  . Drug use: No  . Sexual activity: Not Currently    Birth control/protection: Post-menopausal  Other Topics Concern  . Not on file  Social History Narrative  . Not on file   Social Determinants of Health   Financial Resource Strain: Not on file  Food Insecurity: Not on file  Transportation Needs: Not on file  Physical Activity: Not on file  Stress: Not on file  Social Connections: Not on file   Additional Social History:    Allergies:   Allergies  Allergen Reactions  . Metoprolol Other (See Comments)    Fatigue and malaise; wheezing  . Sulfa Antibiotics     Labs:  Results for orders placed or performed during the hospital encounter of 03/26/21 (from the past 48 hour(s))  Glucose, capillary     Status: None   Collection Time: 04/03/21  4:40 PM  Result Value Ref Range   Glucose-Capillary 84 70 - 99 mg/dL    Comment: Glucose reference range applies only to samples taken after fasting for at least 8 hours.  Glucose, capillary     Status: Abnormal   Collection Time: 04/03/21  8:36 PM  Result Value Ref Range   Glucose-Capillary 104 (H) 70 - 99 mg/dL    Comment: Glucose reference range applies only to samples taken after fasting for at least 8 hours.  CBC with Differential/Platelet     Status: Abnormal   Collection Time: 04/04/21  6:21 AM  Result Value Ref Range   WBC 16.9 (H) 4.0 - 10.5 K/uL   RBC 3.93 3.87 - 5.11 MIL/uL   Hemoglobin 10.7  (L) 12.0 - 15.0 g/dL   HCT 33.0 (L) 36.0 - 46.0 %   MCV 84.0 80.0 - 100.0 fL   MCH 27.2 26.0 - 34.0 pg   MCHC 32.4 30.0 - 36.0 g/dL   RDW 15.3 11.5 - 15.5 %   Platelets 252 150 - 400 K/uL    Comment: Immature Platelet Fraction may be clinically indicated, consider ordering this additional test [id number] PLATELET COUNT CONFIRMED BY SMEAR  nRBC 0.0 0.0 - 0.2 %   Neutrophils Relative % 65 %   Neutro Abs 10.9 (H) 1.7 - 7.7 K/uL   Lymphocytes Relative 16 %   Lymphs Abs 2.7 0.7 - 4.0 K/uL   Monocytes Relative 13 %   Monocytes Absolute 2.2 (H) 0.1 - 1.0 K/uL   Eosinophils Relative 3 %   Eosinophils Absolute 0.5 0.0 - 0.5 K/uL   Basophils Relative 0 %   Basophils Absolute 0.1 0.0 - 0.1 K/uL   WBC Morphology MILD LEFT SHIFT (1-5% METAS, OCC MYELO, OCC BANDS)    RBC Morphology MORPHOLOGY UNREMARKABLE    Smear Review Normal platelet morphology    Immature Granulocytes 3 %   Abs Immature Granulocytes 0.55 (H) 0.00 - 0.07 K/uL    Comment: Performed at [facility], [address] [place], [place] [address]  Basic metabolic panel     Status: Abnormal   Collection Time: 04/04/21  6:21 AM  Result Value Ref Range   Sodium 141 135 - 145 mmol/L   Potassium 3.8 3.5 - 5.1 mmol/L   Chloride 106 98 - 111 mmol/L   CO2 25 22 - 32 mmol/L   Glucose, Bld 102 (H) 70 - 99 mg/dL    Comment: Glucose reference range applies only to samples taken after fasting for at least 8 hours.   BUN 9 8 - 23 mg/dL   Creatinine, Ser 0.78 0.44 - 1.00 mg/dL   Calcium 8.4 (L) 8.9 - 10.3 mg/dL   GFR, Estimated >60 >60 mL/min    Comment: (NOTE) Calculated using the CKD-EPI Creatinine Equation (2021)    Anion gap 10 5 - 15    Comment: Performed at [facility], [address] [place], [place] [address]  Magnesium     Status: None   Collection Time: 04/04/21  6:21 AM  Result Value Ref Range   Magnesium 2.2 1.7 - 2.4 mg/dL    Comment: Performed at [facility], [address]  [place], [place] [address]  Glucose, capillary     Status: Abnormal   Collection Time: 04/04/21 12:16 PM  Result Value Ref Range   Glucose-Capillary 120 (H) 70 - 99 mg/dL    Comment: Glucose reference range applies only to samples taken after fasting for at least 8 hours.  Glucose, capillary     Status: Abnormal   Collection Time: 04/04/21  4:57 PM  Result Value Ref Range   Glucose-Capillary 102 (H) 70 - 99 mg/dL    Comment: Glucose reference range applies only to samples taken after fasting for at least 8 hours.  Glucose, capillary     Status: Abnormal   Collection Time: 04/05/21  8:21 AM  Result Value Ref Range   Glucose-Capillary 113 (H) 70 - 99 mg/dL    Comment: Glucose reference range applies only to samples taken after fasting for at least 8 hours.  Glucose, capillary     Status: Abnormal   Collection Time: 04/05/21 11:56 AM  Result Value Ref Range   Glucose-Capillary 103 (H) 70 - 99 mg/dL    Comment: Glucose reference range applies only to samples taken after fasting for at least 8 hours.    Current Facility-Administered Medications  Medication Dose Route Frequency Provider Last Rate Last Admin  . aspirin chewable tablet 81 mg  81 mg Oral Daily Tyler Pita, MD   81 mg at 04/05/21 8469  . chlorhexidine (PERIDEX) 0.12 % solution 15 mL  15 mL Mouth Rinse BID Vernard Gambles  L, MD   15 mL at 04/05/21 0907  . Chlorhexidine Gluconate Cloth 2 % PADS 6 each  6 each Topical Daily Flora Lipps, MD   6 each at 04/03/21 2050  . diltiazem (CARDIZEM CD) 24 hr capsule 180 mg  180 mg Oral Daily Nita Sells, MD   180 mg at 04/05/21 0907  . enoxaparin (LOVENOX) injection 72.5 mg  0.5 mg/kg Subcutaneous Q24H Flora Lipps, MD   72.5 mg at 04/05/21 0907  . famotidine (PEPCID) tablet 20 mg  20 mg Oral QHS Dallie Piles, RPH   20 mg at 04/04/21 2241  . feeding supplement (ENSURE ENLIVE / ENSURE PLUS) liquid 237 mL  237 mL Oral TID BM Sreenath, Sudheer B, MD   237 mL at 04/05/21 0909  .  haloperidol lactate (HALDOL) injection 1-2 mg  1-2 mg Intramuscular Q6H PRN Mansy, Jan A, MD      . hydrALAZINE (APRESOLINE) injection 10-20 mg  10-20 mg Intravenous Q4H PRN Awilda Bill, NP   10 mg at 03/29/21 1626  . ibuprofen (ADVIL) tablet 600 mg  600 mg Oral Q6H PRN Rust-Chester, Britton L, NP   600 mg at 04/01/21 2331  . insulin aspart (novoLOG) injection 0-6 Units  0-6 Units Subcutaneous TID WC Samtani, Jai-Gurmukh, MD      . LORazepam (ATIVAN) injection 0.5 mg  0.5 mg Intravenous Q4H PRN Mansy, Jan A, MD      . losartan (COZAAR) tablet 50 mg  50 mg Oral Daily Nita Sells, MD   50 mg at 04/05/21 1158  . MEDLINE mouth rinse  15 mL Mouth Rinse q12n4p Tyler Pita, MD   15 mL at 04/04/21 1732  . metoprolol tartrate (LOPRESSOR) injection 5 mg  5 mg Intravenous Q6H PRN Nita Sells, MD   5 mg at 04/04/21 1355  . ondansetron (ZOFRAN) injection 4 mg  4 mg Intravenous Q6H PRN Awilda Bill, NP      . polyethylene glycol (MIRALAX / GLYCOLAX) packet 17 g  17 g Oral Daily PRN Tyler Pita, MD      . risperiDONE (RISPERDAL) tablet 2 mg  2 mg Oral QHS Omran Keelin T, MD      . verapamil (CALAN) tablet 40 mg  40 mg Oral Q12H Nita Sells, MD   40 mg at 04/05/21 0907    Musculoskeletal: Strength & Muscle Tone: within normal limits Gait & Station: Unknown Patient leans: N/A            Psychiatric Specialty Exam:  Presentation  General Appearance: Casual  Eye Contact:Good  Speech:Clear and Coherent; Normal Rate  Speech Volume:Normal  Handedness:Right   Mood and Affect  Mood:Anxious  Affect:Congruent   Thought Process  Thought Processes:Coherent  Descriptions of Associations:Intact  Orientation:Full (Time, Place and Person)  Thought Content:Rumination  History of Schizophrenia/Schizoaffective disorder:No  Duration of Psychotic Symptoms:Less than six months  Hallucinations:No data recorded Ideas of  Reference:Paranoia  Suicidal Thoughts:No data recorded Homicidal Thoughts:No data recorded  Sensorium  Memory:Immediate Good; Remote Good; Recent Good  Judgment:Intact  Insight:Fair   Executive Functions  Concentration:Fair  Attention Span:Fair  [place]   Psychomotor Activity  Psychomotor Activity:No data recorded  Assets  Assets:Communication Skills; Desire for Improvement; Financial Resources/Insurance; Housing; Social Support; Resilience   Sleep  Sleep:No data recorded  Physical Exam: Physical Exam Vitals and nursing note reviewed.  Constitutional:      Appearance: Normal appearance. She is ill-appearing.  HENT:  Head: Normocephalic and atraumatic.     Mouth/Throat:     Pharynx: Oropharynx is clear.  Eyes:     Pupils: Pupils are equal, round, and reactive to light.  Cardiovascular:     Rate and Rhythm: Normal rate and regular rhythm.  Pulmonary:     Effort: Pulmonary effort is normal.     Breath sounds: Normal breath sounds.  Abdominal:     General: Abdomen is flat.     Palpations: Abdomen is soft.  Musculoskeletal:        General: Normal range of motion.  Skin:    General: Skin is warm and dry.  Neurological:     General: No focal deficit present.     Mental Status: Mental status is at baseline.  Psychiatric:        Attention and Perception: She is inattentive. She perceives auditory hallucinations.        Mood and Affect: Affect is blunt.        Speech: Speech is delayed.        Behavior: Behavior is slowed.        Thought Content: Thought content normal. Thought content does not include homicidal or suicidal ideation.        Cognition and Memory: Memory is impaired.    Review of Systems  Constitutional: Positive for malaise/fatigue.  HENT: Negative.   Eyes: Negative.   Respiratory: Negative.   Cardiovascular: Negative.   Gastrointestinal: Negative.   Musculoskeletal: Negative.   Skin:  Negative.   Neurological: Negative.   Psychiatric/Behavioral: Positive for hallucinations and memory loss. Negative for depression, substance abuse and suicidal ideas. The patient is nervous/anxious.    Blood pressure (!) 153/78, pulse (!) 104, temperature 98.5 F (36.9 C), temperature source Oral, resp. rate 20, height 5' 5.98" (1.676 m), weight (!) 140 kg, SpO2 99 %. Body mass index is 49.84 kg/m.  Treatment Plan Summary: Medication management and Plan Confusing situation particularly based on the statement that she was awake and alert earlier this morning and only became somnolent around midday.  This would really not be consistent with the theory that it was the Risperdal that was over sedating for her.  She is really not on any other medicine right now and has not received any as needed's that ought to be over sedating.  Not clear than what exactly the causes of her current condition.  Spoke with hospitalist earlier.  We will cut back the Risperdal to the 2 mg that she had been on previously.  That had not appeared to be over sedating for her.  We will reassess tomorrow.  Disposition: Supportive therapy provided about ongoing stressors. Discussed crisis plan, support from social network, calling 911, coming to the Emergency Department, and calling Suicide Hotline.  Alethia Berthold, MD 04/05/2021 2:07 PM

## 2021-04-06 DIAGNOSIS — F29 Unspecified psychosis not due to a substance or known physiological condition: Secondary | ICD-10-CM | POA: Diagnosis not present

## 2021-04-06 LAB — CBC WITH DIFFERENTIAL/PLATELET
Abs Immature Granulocytes: 0.48 10*3/uL — ABNORMAL HIGH (ref 0.00–0.07)
Basophils Absolute: 0.1 10*3/uL (ref 0.0–0.1)
Basophils Relative: 1 %
Eosinophils Absolute: 0.4 10*3/uL (ref 0.0–0.5)
Eosinophils Relative: 3 %
HCT: 32.4 % — ABNORMAL LOW (ref 36.0–46.0)
Hemoglobin: 10.5 g/dL — ABNORMAL LOW (ref 12.0–15.0)
Immature Granulocytes: 4 %
Lymphocytes Relative: 27 %
Lymphs Abs: 3 10*3/uL (ref 0.7–4.0)
MCH: 27.9 pg (ref 26.0–34.0)
MCHC: 32.4 g/dL (ref 30.0–36.0)
MCV: 86.2 fL (ref 80.0–100.0)
Monocytes Absolute: 1.3 10*3/uL — ABNORMAL HIGH (ref 0.1–1.0)
Monocytes Relative: 11 %
Neutro Abs: 6 10*3/uL (ref 1.7–7.7)
Neutrophils Relative %: 54 %
Platelets: 258 10*3/uL (ref 150–400)
RBC: 3.76 MIL/uL — ABNORMAL LOW (ref 3.87–5.11)
RDW: 15.9 % — ABNORMAL HIGH (ref 11.5–15.5)
WBC: 11.1 10*3/uL — ABNORMAL HIGH (ref 4.0–10.5)
nRBC: 0 % (ref 0.0–0.2)

## 2021-04-06 LAB — BASIC METABOLIC PANEL
Anion gap: 11 (ref 5–15)
BUN: 8 mg/dL (ref 8–23)
CO2: 25 mmol/L (ref 22–32)
Calcium: 8.3 mg/dL — ABNORMAL LOW (ref 8.9–10.3)
Chloride: 104 mmol/L (ref 98–111)
Creatinine, Ser: 0.81 mg/dL (ref 0.44–1.00)
GFR, Estimated: >60 mL/min (ref >60)
Glucose, Bld: 99 mg/dL (ref 70–99)
Potassium: 3.7 mmol/L (ref 3.5–5.1)
Sodium: 140 mmol/L (ref 135–145)

## 2021-04-06 LAB — GLUCOSE, CAPILLARY
Glucose-Capillary: 108 mg/dL — ABNORMAL HIGH (ref 70–99)
Glucose-Capillary: 143 mg/dL — ABNORMAL HIGH (ref 70–99)
Glucose-Capillary: 95 mg/dL (ref 70–99)
Glucose-Capillary: 117 mg/dL — ABNORMAL HIGH (ref 70–99)

## 2021-04-06 MED ORDER — DILTIAZEM HCL ER COATED BEADS 240 MG PO CP24
240.0000 mg | ORAL_CAPSULE | Freq: Every day | ORAL | Status: DC
  Administered 2021-04-07 – 2021-04-10 (×4): 240 mg via ORAL
  Filled 2021-04-06: qty 2
  Filled 2021-04-06: qty 1
  Filled 2021-04-06 (×2): qty 2

## 2021-04-06 MED ORDER — ENOXAPARIN SODIUM 80 MG/0.8ML ~~LOC~~ SOLN
0.5000 mg/kg | SUBCUTANEOUS | Status: DC
  Administered 2021-04-07 – 2021-04-10 (×4): 70 mg via SUBCUTANEOUS
  Filled 2021-04-06 (×4): qty 0.8

## 2021-04-06 NOTE — Progress Notes (Addendum)
PROGRESS NOTE   Megan Oneal  TMA:[id number] DOB: [birth year] DOA: 03/26/2021 PCP: Sharilyn Sites, MD  Brief Narrative:  65 year old white female admitted [facility] 03/26/2021 unresponsive LK and a 6:30 AM that time-family found a bottle of Flexeril next patient had presented previously 03/24/2021 To [facility] ER auditory hallucinations-evaluated by psychiatry-Rx Risperdal and told to follow-up with PCP  Tachycardic-bibasilar atelectasis on arrival-CT head no acute abnormalities Tylenol level less than 10, salicylate level less than 7.0 UDS = + tricyclics, VBG 2.5/63/89  Patient intubated/sedated-LP unsuccessful-Rx acyclovir vancomycin Rocephin--CSF cult negative Extubated 4/9 with intermittent delusional thoughts MRI brain no acute findings EEG no seizures-generalized dysfunction Eventually psychiatry consulted/neurology consulted  Psychiatry reconsulted 04/02/2021-they felt potential steroid induced delirium might of contributed to some of her confusion and changed her Haldol to every 6 hours 1 mg only They also recommended continuation of Risperdal 2 mg, auditory hallucinations were probably secondary to psychotic depression and they discontinued her Depakote   Hospital-Problem based course  Acute toxic metabolic encephalopathy 2/2?  Flexeril with persistence of encephalopathy Patient more somnolent 4/16 Intermittent bouts of agitation and aggression 4/14-psychiatry reconsulted-- Somnolent today 4/16-cut back from 3 mg to risperdal 2-much more awake and coherent 4/17-discontinued safety sitter-work-up for other causes of encephalopathy (ammonia, hypercarbia were negative) EEG negative for focal seizures Auditory hallucinations followed previously [employer] of otosclerosis Prior history PTSD Will need outpatient [facility] follow-up risperdal 2mg --no Depakote 250 discontinued by psychiatry As needed Haldol/Ativan if very agitated-try not to give if possible Acute hypoxic respiratory  failure-extubated 4/9 No oxygen requirement currently CAD/HTN Tachycardia, constitutional hypotension Meds adjusted, discontinue verapamil discontinue losartan Consolidate Cardizem to 240 CD Can discontinue monitors as she does not wish to have them on and thinks that she is "being shocked" Leukocytosis secondary to Decadron Meningitis diagnosis entertained on admission-all cultures however negative Patient seen by infectious disease No further work-up and they have signed off 4/11 Hypokalemia Monitor trends BMI 49.9 Life-threatening-outpatient discussion   DVT prophylaxis: Lovenox Code Status: Full Family Communication:  Called and updated sister [phone] 4/17 barriers to discharge include authorizations from insurances per discussion with social work Disposition:  Status is: Inpatient  Remains inpatient appropriate because:Unsafe d/c plan  Dispo: The patient is from: Home              Anticipated d/c is to: SNF              Patient currently is medically stable to d/c.   Difficult to place patient No  Consultants:   Infectious disease  Neurology  Procedures:  Lumbar puncture attempted  Antimicrobials: None  Subjective: Much more coherent alert Does not have a safety sitter today but does not seem agitated Eating and drinking asking for water No chest pain   Objective: Vitals:   04/06/21 0457 04/06/21 0505 04/06/21 0751 04/06/21 1026  BP: 114/63  108/60 119/62  Pulse: 97  100 (!) 102  Resp: 18  20   Temp: 98.7 F (37.1 C)  98.5 F (36.9 C)   TempSrc: Oral  Oral   SpO2: 97%  94%   Weight:  (!) 139.6 kg    Height:        Intake/Output Summary (Last 24 hours) at 04/06/2021 1032 Last data filed at 04/05/2021 2100 Gross per 24 hour  Intake 237 ml  Output --  Net 237 ml   Filed Weights   04/04/21 0610 04/05/21 0500 04/06/21 0505  Weight: (!) 138.7 kg (!) 140 kg (!) 139.6 kg  Examination:  Oriented x2-still has some strange thoughts S1-S2  no murmur Patient not on monitors (she took them off overnight 4/16) Abdomen soft no rebound no guarding Neurologically intact grossly to motor moving all 4 limbs Psych-flat affect-quite suspicious-but calmer than prior   Data Reviewed: personally reviewed   BUNs/creatinine 8/0.8 White count down from 16-11 Hemoglobin 10  Radiology Studies: No results found.   Scheduled Meds: . aspirin  81 mg Oral Daily  . chlorhexidine  15 mL Mouth Rinse BID  . Chlorhexidine Gluconate Cloth  6 each Topical Daily  . [START ON 04/07/2021] diltiazem  240 mg Oral Daily  . enoxaparin (LOVENOX) injection  0.5 mg/kg Subcutaneous Q24H  . famotidine  20 mg Oral QHS  . feeding supplement  237 mL Oral TID BM  . insulin aspart  0-6 Units Subcutaneous TID WC  . mouth rinse  15 mL Mouth Rinse q12n4p  . potassium chloride  40 mEq Oral BID  . risperiDONE  2 mg Oral QHS   Continuous Infusions:   LOS: 11 days   Time spent: 15 minutes  Nita Sells, MD [facility] To contact the attending provider between 7A-7P or the covering provider during after hours 7P-7A, please log into the web site [email or web address] and access using universal [place] password for that web site. If you do not have the password, please call the hospital operator.  04/06/2021, 10:32 AM

## 2021-04-07 DIAGNOSIS — F29 Unspecified psychosis not due to a substance or known physiological condition: Secondary | ICD-10-CM | POA: Diagnosis not present

## 2021-04-07 LAB — GLUCOSE, CAPILLARY
Glucose-Capillary: 93 mg/dL (ref 70–99)
Glucose-Capillary: 108 mg/dL — ABNORMAL HIGH (ref 70–99)
Glucose-Capillary: 121 mg/dL — ABNORMAL HIGH (ref 70–99)
Glucose-Capillary: 112 mg/dL — ABNORMAL HIGH (ref 70–99)

## 2021-04-07 MED ORDER — MUSCLE RUB 10-15 % EX CREA
1.0000 "application " | TOPICAL_CREAM | CUTANEOUS | Status: DC | PRN
  Filled 2021-04-07: qty 85

## 2021-04-07 MED ORDER — IBUPROFEN 600 MG PO TABS: 600.0000 mg | ORAL_TABLET | Freq: Four times a day (QID) | ORAL | 0 refills | Status: DC | PRN

## 2021-04-07 MED ORDER — ALUM & MAG HYDROXIDE-SIMETH 200-200-20 MG/5ML PO SUSP
30.0000 mL | ORAL | Status: DC | PRN
  Administered 2021-04-07 – 2021-04-09 (×3): 30 mL via ORAL
  Filled 2021-04-07 (×3): qty 30

## 2021-04-07 MED ORDER — RISPERIDONE 2 MG PO TABS: 2.0000 mg | ORAL_TABLET | Freq: Every day | ORAL | 0 refills | Status: DC

## 2021-04-07 MED ORDER — GUAIFENESIN-DM 100-10 MG/5ML PO SYRP: 5.0000 mL | ORAL_SOLUTION | ORAL | Status: DC | PRN

## 2021-04-07 MED ORDER — DILTIAZEM HCL ER COATED BEADS 240 MG PO CP24
240.0000 mg | ORAL_CAPSULE | Freq: Every day | ORAL | Status: DC
Start: 2021-04-07 — End: 2021-04-10

## 2021-04-07 MED ORDER — POLYVINYL ALCOHOL 1.4 % OP SOLN
1.0000 [drp] | OPHTHALMIC | Status: DC | PRN
  Filled 2021-04-07: qty 15

## 2021-04-07 NOTE — Progress Notes (Signed)
OT Cancellation Note  Patient Details Name: Megan Oneal MRN: [id number] DOB: 08-07-1956   Cancelled Treatment:    Reason Eval/Treat Not Completed: Fatigue/lethargy limiting ability to participate. Upon arrival pt reclined in bed asleep, breakfast 1/3 eaten at bedside. Pt not arousable to light, voice, or deep touch. Will re-attempt at later date/time as able.   Dessie Coma, M.S. OTR/L  04/07/21, 9:44 AM  ascom [phone]

## 2021-04-07 NOTE — Discharge Summary (Addendum)
Physician Discharge Summary  Megan Oneal:[id number] DOB: 05/15/1956 DOA: 03/26/2021  PCP: Sharilyn Sites, MD  Admit date: 03/26/2021 Discharge date: 04/07/2021  Time spent: 40 minutes  Recommendations for Outpatient Follow-up:  1. Patient will need titration of Risperdal in the outpatient setting per psychiatry 2.  should follow-up at [facility] for her auditory hallucinations in the next week on discharge from hospital 3. Please avoid any narcotics or other sedating agents-came in with accidental overdose of Flexeril this admission 4. Recommend Chem-12 CBC in about 1 week at facility 5. Will be discharging from skilled facility to son's care who will take her eventually to [facility] coordinate with them on discharge from facility and send records to PCP that he will find over the  Discharge Diagnoses:  MAIN problem for hospitalization   toxic metabolic encephalopathy likely secondary to iatrogenic overdose of Flexeril  Please see below for itemized issues addressed in [place]- refer to other progress notes for clarity if needed  Discharge Condition: Guarded  Diet recommendation: Heart healthy  Filed Weights   04/05/21 0500 04/06/21 0505 04/07/21 0419  Weight: (!) 140 kg (!) 139.6 kg (!) 138.6 kg    History of present illness:  65 femadmitted [facility] 03/26/2021 unresponsive LK and a 6:30 AM that time-family found a bottle of Flexeril next patient had presented previously 03/24/2021 To [facility] ER auditory hallucinations-evaluated by psychiatry-Rx Risperdal and told to follow-up with PCP  Tachycardic-bibasilar atelectasis on arrival-CT head no acute abnormalities Tylenol level less than 10, salicylate level less than 7.0 UDS = + tricyclics, VBG 1.5/52/08  Patient intubated/sedated-LP unsuccessful-Rx acyclovir vancomycin Rocephin--CSF cult negative Extubated 4/9 with intermittent delusional thoughts MRI brain no acute findings EEG no seizures-generalized dysfunction Eventually  psychiatry consulted/neurology consulted  Psychiatry reconsulted 04/02/2021-they felt potential steroid induced delirium might of contributed to some of her confusion and changed her Haldol to every 6 hours 1 mg only They also recommended continuation of Risperdal 2 mg, auditory hallucinations were probably secondary to psychotic depression and they discontinued her Depakote  Hospital Course:  Acute toxic metabolic encephalopathy 2/2?  Flexeril with persistence of encephalopathy Patient more somnolent 4/16 Intermittent bouts of agitation and aggression 4/14-psychiatry reconsulted-- Somnolent 4/16-cut back from 3 mg to risperdal 2- much more awake and coherent 4/17-discontinued safety sitter- work-up for other causes of encephalopathy (ammonia, hypercarbia were negative) EEG negative for focal seizures stable for d/c Auditory hallucinations followed previously [employer] of otosclerosis Prior history PTSD Will need outpatient [facility] follow-up-should follow-up with either ENT at [place] 2mg --no Depakote 250 discontinued by psychiatry Acute hypoxic respiratory failure-extubated 4/9 No oxygen requirement currently CAD/HTN Tachycardia, constitutional hypotension Meds adjusted, discontinue verapamil discontinue losartan Consolidate Cardizem to 240 CD Can discontinue monitors as she does not wish to have them on and thinks that she is "being shocked" Leukocytosis secondary to Decadron Meningitis diagnosis entertained on admission-all cultures however negative Patient seen by infectious disease No further work-up and they have signed off 4/11 Hyperglycemia without diagnosis of DM TY 2-probably secondary to steroids given during hospital stay A1c 6.4 Outpatient consideration for med initiation as per PCP etc. No insulin on discharge Hypokalemia Monitor trends in the outpatient setting as per my note BMI 49.9 Life-threatening-outpatient  discussion  Consultations:  Psychiatrist  Discharge Exam: Vitals:   04/07/21 0747 04/07/21 0820  BP: 137/70 114/61  Pulse: (!) 102 (!) 102  Resp: 16 16  Temp: 98.6 F (37 C) 98.6 F (37 C)  SpO2: 98% 97%  Subj on day of d/c  Awake coherent walking around the unit with some support with the handrails No chest pain no fever Seems to be more inclined to going to skilled facility   General Exam on discharge  CTA B no added sound no rales no rhonchi Coherent pleasant no distress No lower extremity edema Ate half of her breakfast   Discharge Instructions   Discharge Instructions    Diet - low sodium heart healthy   Complete by: As directed    Increase activity slowly   Complete by: As directed      Allergies as of 04/07/2021      Reactions   Metoprolol Other (See Comments)   Fatigue and malaise; wheezing   Sulfa Antibiotics       Medication List    STOP taking these medications   cyclobenzaprine 10 MG tablet Commonly known as: FLEXERIL   diltiazem 30 MG tablet Commonly known as: CARDIZEM   escitalopram 10 MG tablet Commonly known as: LEXAPRO   famotidine 20 MG tablet Commonly known as: PEPCID   HYDROcodone-acetaminophen 5-325 MG tablet Commonly known as: NORCO/VICODIN   levocetirizine 5 MG tablet Commonly known as: XYZAL   losartan 100 MG tablet Commonly known as: COZAAR   meclizine 25 MG tablet Commonly known as: ANTIVERT   megestrol 20 MG tablet Commonly known as: MEGACE   meloxicam 15 MG tablet Commonly known as: MOBIC   rosuvastatin 5 MG tablet Commonly known as: CRESTOR   verapamil 240 MG 24 hr capsule Commonly known as: VERELAN PM     TAKE these medications   aspirin 81 MG tablet Take 81 mg by mouth daily.   cholecalciferol 25 MCG (1000 UNIT) tablet Commonly known as: VITAMIN D3 Take 1,000 Units by mouth daily.   dexlansoprazole 60 MG capsule Commonly known as: DEXILANT Take 60 mg by mouth daily.   diltiazem 240 MG  24 hr capsule Commonly known as: CARDIZEM CD Take 1 capsule (240 mg total) by mouth daily.   ferrous sulfate 325 (65 FE) MG tablet Take 325 mg by mouth daily with breakfast.   fluticasone 50 MCG/ACT nasal spray Commonly known as: FLONASE Place 2 sprays into both nostrils daily as needed for allergies.   furosemide 40 MG tablet Commonly known as: LASIX Take 40 mg by mouth every other day.   ibuprofen 600 MG tablet Commonly known as: ADVIL Take 1 tablet (600 mg total) by mouth every 6 (six) hours as needed for moderate pain.   ipratropium 0.03 % nasal spray Commonly known as: ATROVENT Place 2 sprays into both nostrils 3 (three) times daily as needed for rhinitis.   polyethylene glycol powder 17 GM/SCOOP powder Commonly known as: GLYCOLAX/MIRALAX 1 scoop daily or as needed   prednisoLONE acetate 1 % ophthalmic suspension Commonly known as: PRED FORTE 1 drop 4 (four) times daily.   Premarin vaginal cream Generic drug: conjugated estrogens PLACE 1 APPLICATORFUL VAGINALLY DAILY.   risperiDONE 2 MG tablet Commonly known as: RISPERDAL Take 1 tablet (2 mg total) by mouth at bedtime. What changed:   medication strength  how much to take  when to take this   vitamin C 500 MG tablet Commonly known as: ASCORBIC ACID Take 500 mg by mouth daily.      Allergies  Allergen Reactions  . Metoprolol Other (See Comments)    Fatigue and malaise; wheezing  . Sulfa Antibiotics     Contact information for after-discharge care    Destination    [facility] .   Service: Skilled Nursing Contact information: [address] [place] [phone]                   The results of significant diagnostics from this hospitalization (including imaging, microbiology, ancillary and laboratory) are listed below for reference.    Significant Diagnostic Studies: DG Chest 1 View  Result Date: 03/28/2021 CLINICAL DATA:   Hypoxia.  Central catheter placement EXAM: CHEST  1 VIEW COMPARISON:  March 28, 2021 study obtained earlier in the day FINDINGS: Central catheter tip is at the cavoatrial junction. Endotracheal tube tip is 3.1 cm above the carina. Nasogastric tube tip and side port in stomach. No pneumothorax. There is mild left base atelectasis. The lungs elsewhere are clear. Heart is enlarged with pulmonary vascularity within normal limits. No adenopathy. No bone lesions. IMPRESSION: Tube and catheter positions as described without pneumothorax. No edema or consolidation. Left base atelectasis. There is cardiomegaly. No adenopathy. Electronically Signed   By: Lowella Grip III M.D.   On: 03/28/2021 13:11   CT Head Wo Contrast  Result Date: 03/26/2021 CLINICAL DATA:  Change in mental status.  Unknown cause. EXAM: CT HEAD WITHOUT CONTRAST TECHNIQUE: Contiguous axial images were obtained from the base of the skull through the vertex without intravenous contrast. COMPARISON:  MRI brain 03/21/2021.  CT head 03/21/2021 FINDINGS: Brain: No evidence of acute infarction, hemorrhage, hydrocephalus, extra-axial collection or mass lesion/mass effect. Vascular: No hyperdense vessel or unexpected calcification. Skull: Calvarium appears intact. Sinuses/Orbits: Paranasal sinuses and mastoid air cells are clear. Other: Congenital nonunion of the posterior arch of C1. IMPRESSION: No acute intracranial abnormalities. Electronically Signed   By: Lucienne Capers M.D.   On: 03/26/2021 19:08   CT Head Wo Contrast  Result Date: 03/21/2021 CLINICAL DATA:  Neuro deficit, acute, stroke suspected. Additional history provided: Patient reports dizziness EXAM: CT HEAD WITHOUT CONTRAST TECHNIQUE: Contiguous axial images were obtained from the base of the skull through the vertex without intravenous contrast. COMPARISON:  No pertinent prior exams available for comparison. FINDINGS: Brain: Cerebral volume is normal. There is no acute intracranial  hemorrhage. No demarcated cortical infarct. No extra-axial fluid collection. No evidence of intracranial mass. No midline shift. Vascular: No hyperdense vessel.  Atherosclerotic calcifications. Skull: Normal. Negative for fracture or focal lesion. Sinuses/Orbits: Visualized orbits show no acute finding. No significant paranasal sinus disease at the imaged levels. Other: Congenital nonunion of the posterior arch of C1. IMPRESSION: Unremarkable non-contrast CT appearance of the brain. No evidence of acute intracranial abnormality. Electronically Signed   By: Kellie Simmering DO   On: 03/21/2021 12:38   MR BRAIN WO CONTRAST  Result Date: 03/27/2021 CLINICAL DATA:  Mental status change. EXAM: MRI HEAD WITHOUT CONTRAST TECHNIQUE: Multiplanar, multiecho pulse sequences of the brain and surrounding structures were obtained without intravenous contrast. COMPARISON:  Head CT March 26, 2021 FINDINGS: Brain: No acute infarction, hemorrhage, hydrocephalus, extra-axial collection or mass lesion. A few small foci of T2 hyperintensity are seen within the white matter of the cerebral hemispheres, nonspecific. Vascular: Normal flow voids. Skull and upper cervical spine: Normal marrow signal. Sinuses/Orbits: Negative. IMPRESSION: 1. No acute intracranial abnormality. 2. A few small foci of T2 hyperintensity within the white matter of the cerebral hemispheres, nonspecific but may be seen with chronic microvascular ischemic changes. Electronically Signed   By: Pedro Earls M.D.   On: 03/27/2021 14:43   MR BRAIN WO CONTRAST  Result Date: 03/21/2021 CLINICAL  DATA:  Dizzy EXAM: MRI HEAD WITHOUT CONTRAST TECHNIQUE: Multiplanar, multiecho pulse sequences of the brain and surrounding structures were obtained without intravenous contrast. COMPARISON:  None. FINDINGS: Brain: There is no acute infarction or intracranial hemorrhage. There is no intracranial mass, mass effect, or edema. There is no hydrocephalus or extra-axial  fluid collection. Ventricles and sulci are normal in size and configuration. Few punctate foci of T2 hyperintensity are present in the supratentorial white matter and may reflect minor chronic microvascular ischemic changes or gliosis/demyelination of other etiologies. Vascular: Major vessel flow voids at the skull base are preserved. Skull and upper cervical spine: Normal marrow signal is preserved. Sinuses/Orbits: Trace mucosal thickening.  Orbits are unremarkable. Other: Sella is unremarkable.  Mastoid air cells are clear. IMPRESSION: No evidence of recent infarction, hemorrhage, or mass. Probable minor chronic microvascular ischemic changes. Electronically Signed   By: Macy Mis M.D.   On: 03/21/2021 14:53   DG Chest Port 1 View  Result Date: 03/28/2021 CLINICAL DATA:  Acute respiratory failure with hypoxia. EXAM: PORTABLE CHEST 1 VIEW COMPARISON:  03/26/2021 FINDINGS: Endotracheal and enteric tubes are unchanged in position. Shallow inspiration. Mild cardiac enlargement. Prominent pulmonary vascularity with bilateral basilar atelectasis. No definite pleural effusion or pneumothorax. Degenerative changes in the spine. IMPRESSION: Shallow inspiration with bilateral basilar atelectasis. Cardiac enlargement with pulmonary vascular congestion, similar to prior study. Electronically Signed   By: Lucienne Capers M.D.   On: 03/28/2021 01:21   DG Chest Portable 1 View  Result Date: 03/26/2021 CLINICAL DATA:  Intubation, placement of or of gastric tube, altered mental status EXAM: PORTABLE CHEST 1 VIEW COMPARISON:  Portable exam 1948 hours compared to 1803 hours FINDINGS: Tip of endotracheal tube projects 2.2 cm above carina. Orogastric tube extends into stomach. Enlargement of cardiac silhouette with vascular congestion. Mild RIGHT basilar atelectasis. Remaining lungs clear. No pleural effusion or pneumothorax. IMPRESSION: Mild RIGHT basilar atelectasis. Tube positions as above. Electronically Signed   By:  Lavonia Dana M.D.   On: 03/26/2021 20:17   DG Chest Portable 1 View  Result Date: 03/26/2021 CLINICAL DATA:  Altered mental status, hypertension, diabetes mellitus EXAM: PORTABLE CHEST 1 VIEW COMPARISON:  Portable exam 1812 hours compared to 03/22/2021 FINDINGS: Enlargement of cardiac silhouette with pulmonary vascular congestion. Mediastinal contour stable. Decreased lung volumes with minimal bibasilar atelectasis. No acute infiltrate, pleural effusion, or pneumothorax. IMPRESSION: Enlargement of cardiac silhouette. Low lung volumes with bibasilar atelectasis. Electronically Signed   By: Lavonia Dana M.D.   On: 03/26/2021 18:41   DG Chest Portable 1 View  Result Date: 03/22/2021 CLINICAL DATA:  Chest tightness. EXAM: PORTABLE CHEST 1 VIEW COMPARISON:  July 23, 2008 FINDINGS: Cardiomegaly. The hila and mediastinum are unremarkable. No pneumothorax. Mild interstitial prominence without overt edema. No suspicious infiltrates. IMPRESSION: Cardiomegaly. Suspected pulmonary venous congestion. No overt edema. Electronically Signed   By: Dorise Bullion III M.D   On: 03/22/2021 18:31   DG Abd Portable 1 View  Result Date: 03/26/2021 CLINICAL DATA:  Orogastric tube placement EXAM: PORTABLE ABDOMEN - 1 VIEW COMPARISON:  Portable exam 1949 hours without priors for comparison FINDINGS: Orogastric tube coiled in proximal stomach. Visualized bowel gas pattern normal. Prior lumbar fusion. Borderline enlargement of cardiac silhouette. Surgical clips RIGHT upper quadrant likely reflect prior cholecystectomy. IMPRESSION: Nasogastric tube coiled in proximal stomach. Electronically Signed   By: Lavonia Dana M.D.   On: 03/26/2021 20:16   EEG adult  Result Date: 04/02/2021 Theodosia Blender, MD     04/02/2021  8:02 PM [facility] EEG HISTORY:  65 year old female presents with altered mental status. INTRODUCTION:  A digital EEG was performed in the laboratory using the standard international 10/20 system of  electrode placement in addition to one channel of EKG monitoring.  Hyperventilation was not performed. Photic Stimulation was performed.  This tracing captures wakefulness through drowsiness. This EEG was recording for about 20 minutes. DESCRIPTION OF RECORD:  In the most alert state the alpha rhythm is 9 Hz in frequency, which is seen in the occipital region and attenuates with eye opening and is bilaterally synchronous and symmetrical.  No focal slowing, sharp waves, or epileptiform discharges are seen. Photic stimulation was performed and did not produce any abnormalities. Heart rate was irregular at a rate of 120 bpm. IMPRESSION:  This is a normal adult EEG in the awake and drowsy states. No focal slowing, focal abnormalities, epileptiform discharges, or electrographic seizures are seen.  Of note, a normal EEG does not exclude the diagnosis of seizure disorder.  A repeat sleep deprived EEG or 24 hour EEG could be considered.  The abnormality on single lead EKG could be further evaluated by 12 lead EKG.  Clinical correlation is recommended.   DG FL GUIDED LUMBAR PUNCTURE  Result Date: 03/27/2021 CLINICAL DATA:  Acute encephalopathy. EXAM: DIAGNOSTIC LUMBAR PUNCTURE UNDER FLUOROSCOPIC GUIDANCE COMPARISON:  MRI lumbar spine 04/25/2008. FLUOROSCOPY TIME:  Fluoroscopy Time:  0 minutes 54 seconds Radiation Exposure Index (if provided by the fluoroscopic device): 42.9 mGy Number of Acquired Spot Images: 1 PROCEDURE: After discussing the risks and benefits of this procedure with the patient's sister informed consent was obtained. Risk of bleeding, infection, allergy, and need for blood patch discussed with patient's sister. With the patient prone, the lower back was prepped with Betadine. 1% Lidocaine was used for local anesthesia. Lumbar puncture was performed at the L5-S1 level using a 22 gauge needle with return of clear CSF. Opening pressure not obtained due to patient's clinical condition. 8 ml of CSF were  obtained for laboratory studies. The patient tolerated the procedure well and there were no apparent complications. IMPRESSION: Successful fluoroscopically directed lumbar puncture. 8 cc of clear CSF obtained and sent to the laboratory for ordered studies. Electronically Signed   By: Marcello Moores  Register   On: 03/27/2021 14:59    Microbiology: No results found for this or any previous visit (from the past 240 hour(s)).   Labs: Basic Metabolic Panel: Recent Labs  Lab 04/02/21 0425 04/03/21 0409 04/04/21 0621 04/05/21 1625 04/06/21 0525  NA 142 143 141 139 140  K 3.4* 3.3* 3.8 3.1* 3.7  CL 106 108 106 106 104  CO2 26 26 25 24 25   GLUCOSE 107* 93 102* 111* 99  BUN 14 10 9 8 8   CREATININE 0.89 0.77 0.78 0.71 0.81  CALCIUM 8.9 8.3* 8.4* 8.1* 8.3*  MG 2.1 2.1 2.2  --   --    Liver Function Tests: Recent Labs  Lab 04/05/21 1625  AST 25  ALT 32  ALKPHOS 49  BILITOT 0.7  PROT 6.0*  ALBUMIN 2.8*   No results for input(s): LIPASE, AMYLASE in the last 168 hours. Recent Labs  Lab 04/05/21 1625  AMMONIA 10   CBC: Recent Labs  Lab 04/02/21 0425 04/03/21 0409 04/04/21 0621 04/06/21 0525  WBC 21.4* 16.3* 16.9* 11.1*  NEUTROABS 15.3* 10.3* 10.9* 6.0  HGB 11.7* 10.2* 10.7* 10.5*  HCT 36.6 32.7* 33.0* 32.4*  MCV 86.5 87.0 84.0 86.2  PLT 239 233  252 258   Cardiac Enzymes: No results for input(s): CKTOTAL, CKMB, CKMBINDEX, TROPONINI in the last 168 hours. BNP: BNP (last 3 results) Recent Labs    03/26/21 1732  BNP 22.6    ProBNP (last 3 results) No results for input(s): PROBNP in the last 8760 hours.  CBG: Recent Labs  Lab 04/06/21 0751 04/06/21 1139 04/06/21 1631 04/06/21 2049 04/07/21 0821  GLUCAP 108* 143* 95 117* 93       Signed:  Nita Sells MD   [facility] 04/07/2021, 10:47 AM

## 2021-04-07 NOTE — Care Management Important Message (Signed)
Important Message  Patient Details  Name: Megan Oneal MRN: [id number] Date of Birth: 1956-05-24   Medicare Important Message Given:  Yes     Juliann Pulse A Francely Craw 04/07/2021, 11:58 AM

## 2021-04-07 NOTE — Progress Notes (Signed)
Occupational Therapy Treatment Patient Details Name: Megan Oneal MRN: [id number] DOB: 24-Dec-1955 Today's Date: 04/07/2021    History of present illness Pt is a 65 y/o female presenting with severe encephalopathy in the context of severe hypoxic respiratory failure requiring intubation. Underlying etiology most likely secondary to medication overdose at home; suspect Flexeril. PMH: HTN, DM2, DDD, anxiety, depression   OT comments  Ms [name] was seen for OT treatment on this date. Upon arrival to room pt seated EOB with family member at bedside. Per family member report, pt typically is a very deep sleeper and is hard of hearing making her difficult to arouse from sleep. Pt requires CGA for ADL t/f, intermittent rail use. Pt tolerated ~80 ft mobility, HR noted 140 bpm, pt required multiple rest breaks. MIN VCs don pants and B shoes seated EOB - cues for safety. Pt left up in chair with family meber at bedside. Pt making good progress toward goals. Pt continues to benefit from skilled OT services to maximize return to PLOF and minimize risk of future falls, injury, caregiver burden, and readmission. Will continue to follow POC. Discharge recommendation remains appropriate.    Follow Up Recommendations  SNF    Equipment Recommendations  3 in 1 bedside commode    Recommendations for Other Services      Precautions / Restrictions Precautions Precautions: Fall Restrictions Weight Bearing Restrictions: No       Mobility Bed Mobility               General bed mobility comments: pt recevied EOB and left in chair    Transfers Overall transfer level: Needs assistance Equipment used: None Transfers: Sit to/from Stand Sit to Stand: Supervision              Balance Overall balance assessment: Needs assistance Sitting-balance support: Feet supported Sitting balance-Leahy Scale: Good     Standing balance support: Single extremity supported;During functional activity Standing  balance-Leahy Scale: Fair                             ADL either performed or assessed with clinical judgement   ADL Overall ADL's : Needs assistance/impaired                                       General ADL Comments: CGA for ADL t/f, intermittent rail use. MIN VCs don pants and B shoes seated EOB - cues for safety.               Cognition Arousal/Alertness: Awake/alert Behavior During Therapy: Flat affect Overall Cognitive Status: History of cognitive impairments - at baseline                                          Exercises Exercises: Other exercises Other Exercises Other Exercises: Pt and family educated re: OT role, DME recs, d/c recs, falls prevention, ECS Other Exercises: LBD, sit<>stand, sitting/standing balance/tolernace, ~80 ft mobility   Shoulder Instructions       General Comments HR 140 walking in hall    Pertinent Vitals/ Pain       Pain Assessment: No/denies pain         Frequency  Min 1X/week        Progress Toward Goals  OT Goals(current goals can now be found in the care plan section)  Progress towards OT goals: Progressing toward goals  Acute Rehab OT Goals Patient Stated Goal: none stated OT Goal Formulation: With patient Time For Goal Achievement: 04/14/21 Potential to Achieve Goals: Good ADL Goals Pt Will Perform Grooming: with modified independence;sitting Pt Will Perform Lower Body Dressing: with min assist;sitting/lateral leans Pt Will Transfer to Toilet: with min guard assist;ambulating;bedside commode  Plan Discharge plan remains appropriate;Frequency remains appropriate       AM-PAC OT "6 Clicks" Daily Activity     Outcome Measure   Help from another person eating meals?: A Little Help from another person taking care of personal grooming?: A Little Help from another person toileting, which includes using toliet, bedpan, or urinal?: A Little Help from another person bathing  (including washing, rinsing, drying)?: A Lot Help from another person to put on and taking off regular upper body clothing?: A Little Help from another person to put on and taking off regular lower body clothing?: A Little 6 Click Score: 17    End of Session    OT Visit Diagnosis: Unsteadiness on feet (R26.81);Other abnormalities of gait and mobility (R26.89);Muscle weakness (generalized) (M62.81)   Activity Tolerance Patient tolerated treatment well   Patient Left in chair;with call bell/phone within reach;with family/visitor present   Nurse Communication          Time: 3016-0109 OT Time Calculation (min): 13 min  Charges: OT General Charges $OT Visit: 1 Visit OT Treatments $Self Care/Home Management : 8-22 mins  Dessie Coma, M.S. OTR/L  04/07/21, 1:55 PM  ascom [phone]

## 2021-04-07 NOTE — Progress Notes (Signed)
Nutrition Follow Up Note   DOCUMENTATION CODES:   Morbid obesity  INTERVENTION:   Ensure Enlive po TID, each supplement provides 350 kcal and 20 grams of protein  NUTRITION DIAGNOSIS:   Inadequate oral intake related to inability to eat (pt sedated and ventilated) as evidenced by NPO status.  GOAL:   Patient will meet greater than or equal to 90% of their needs  -progressing   MONITOR:   PO intake,Supplement acceptance,Labs,Weight trends,Skin,I & O's  ASSESSMENT:   65 y/o female with h/o anxiety, depression, endometriosis, HTN, DJD and DM who is admitted with unresponsiveness  Pt with improved appetite and oral intake; pt eating 25-100% of meals in hospital and is drinking some Ensure supplements. Per chart, pt is down 12lbs(4%) since admit; this is significant weight loss. Recommend continue supplements after discharge. Plan is for SNF; pending insurance authorization.   Medications reviewed and include: aspirin, lovenox, pepcid, insulin, KCl  Labs reviewed: K 3.7 wnl Wbc- 11.1(H), Hgb 10.5(L), Hct 32.4(L) cbgs- 93, 108 x 24 hrs  Diet Order:   Diet Order            Diet - low sodium heart healthy           Diet heart healthy/carb modified Room service appropriate? Yes; Fluid consistency: Thin  Diet effective now                EDUCATION NEEDS:   No education needs have been identified at this time  Skin:  Skin Assessment: Reviewed RN Assessment  Last BM:  4/18  Height:   Ht Readings from Last 1 Encounters:  03/26/21 5' 5.98" (1.676 m)    Weight:   Wt Readings from Last 1 Encounters:  04/07/21 (!) 138.6 kg    Ideal Body Weight:  59 kg  BMI:  Body mass index is 49.33 kg/m.  Estimated Nutritional Needs:   Kcal:  2400-2700kcal/day  Protein:  >120g/day  Fluid:  1.8-2.1L/day  Koleen Distance MS, RD, LDN Please refer to [facility] for RD and/or RD on-call/weekend/after hours pager

## 2021-04-07 NOTE — TOC Progression Note (Signed)
Transition of Care [facility]) - Progression Note    Patient Details  Name: Megan Oneal MRN: [id number] Date of Birth: Mar 31, 1956  Transition of Care [facility]) CM/SW [place], RN Phone Number: 04/07/2021, 11:47 AM  Clinical Narrative:  [facility] to get bed assignment for discharge. Helene Kelp called back says [employer] Authorization has not been received per Irven Shelling, admissions coordinator. I did ask about LOG, call back pending.     Expected Discharge Plan: Skilled Nursing Facility Barriers to Discharge: [place] Work up,Requiring sitter/restraints  Expected Discharge Plan and Services Expected Discharge Plan: [place] Choice: [place] arrangements for the past 2 months: Single Family Home Expected Discharge Date: 04/07/21                                     Social Determinants of Health (SDOH) Interventions    Readmission Risk Interventions No flowsheet data found.

## 2021-04-08 LAB — BLOOD GAS, ARTERIAL
Acid-Base Excess: 2.2 mmol/L — ABNORMAL HIGH (ref 0.0–2.0)
Allens test (pass/fail): POSITIVE — AB
Bicarbonate: 25 mmol/L (ref 20.0–28.0)
FIO2: 21
O2 Saturation: 97.2 %
Patient temperature: 37
pCO2 arterial: 32 mmHg (ref 32.0–48.0)
pH, Arterial: 7.5 — ABNORMAL HIGH (ref 7.350–7.450)
pO2, Arterial: 84 mmHg (ref 83.0–108.0)

## 2021-04-08 LAB — GLUCOSE, CAPILLARY
Glucose-Capillary: 115 mg/dL — ABNORMAL HIGH (ref 70–99)
Glucose-Capillary: 127 mg/dL — ABNORMAL HIGH (ref 70–99)
Glucose-Capillary: 106 mg/dL — ABNORMAL HIGH (ref 70–99)

## 2021-04-08 LAB — SARS CORONAVIRUS 2 (TAT 6-24 HRS): SARS Coronavirus 2: NEGATIVE

## 2021-04-08 NOTE — TOC Progression Note (Signed)
Transition of Care [facility]) - Progression Note    Patient Details  Name: Megan Oneal MRN: [id number] Date of Birth: 10/06/1956  Transition of Care [facility]) CM/SW [place], LCSW Phone Number: 04/08/2021, 3:32 PM  Clinical Narrative:   [facility] denied SNF admission stating pt does not have a skillable need. Peer to Peer information given to MD.    Expected Discharge Plan: Skilled Nursing Facility Barriers to Discharge: Insurance Authorization,Continued Medical Work up,Requiring sitter/restraints  Expected Discharge Plan and Services Expected Discharge Plan: [place] Choice: [place] arrangements for the past 2 months: Single Family Home Expected Discharge Date: 04/08/21                                     Social Determinants of Health (SDOH) Interventions    Readmission Risk Interventions No flowsheet data found.

## 2021-04-08 NOTE — Progress Notes (Signed)
Physical Therapy Treatment Patient Details Name: Megan Oneal MRN: [id number] DOB: 05-23-1956 Today's Date: 04/08/2021    History of Present Illness Pt is a 65 y/o female presenting with severe encephalopathy in the context of severe hypoxic respiratory failure requiring intubation. Underlying etiology most likely secondary to medication overdose at home; suspect Flexeril. PMH: HTN, DM2, DDD, anxiety, depression    PT Comments    Pt was seated in standard height chair with armrest upon arriving. She is awake and oriented x 2. Disoriented to situation and time. Pt states," I'm ready to go to rehab." She was agreeable to OOB activity. Pt stood from chair with supervision and ambulated 30ft without AD, does have balance deficits without UE support. Occasionally required use of hallway rail to prevent LOB. Pt's cognition and poor safety awareness make pt high fall risk. She lives alone and does not have anyone who can stay with her. Recommend DC to SNF to address balance deficits prior to returning home. She returned to sitting in standard chair with RN staff aware.     Follow Up Recommendations  SNF;Other (comment);Supervision/Assistance - 24 hour;Supervision for mobility/OOB (pt does not have assistance at home)     Equipment Recommendations  Other (comment) (defer to next level of care)    Recommendations for Other Services       Precautions / Restrictions Precautions Precautions: Fall Restrictions Weight Bearing Restrictions: No    Mobility  Bed Mobility      General bed mobility comments: Pt was in standard height chair in room upon arriving. RN staff was aware    Transfers Overall transfer level: Needs assistance Equipment used: None Transfers: Sit to/from Stand Sit to Stand: Supervision         General transfer comment: close supervision due to pt's cognitive status and anxiousness  Ambulation/Gait Ambulation/Gait assistance: Min assist Gait Distance (Feet): 75  Feet Assistive device: None Gait Pattern/deviations: Step-through pattern;Decreased step length - right;Decreased step length - left;Trunk flexed Gait velocity: decreased   General Gait Details: pt was able to ambulate 75 ft with Min assist at times to prevent LOB. Occasionally reaching for hallway rail for support.       Balance Overall balance assessment: Needs assistance Sitting-balance support: Feet supported Sitting balance-Leahy Scale: Good     Standing balance support: No upper extremity supported;During functional activity Standing balance-Leahy Scale: Poor Standing balance comment: pt is high fall risk without UE support       Cognition Arousal/Alertness: Awake/alert Behavior During Therapy: Anxious Overall Cognitive Status: History of cognitive impairments - at baseline Area of Impairment: Orientation;Memory;Attention;Safety/judgement;Following commands;Problem solving;Awareness        Orientation Level: Disoriented to;Situation Current Attention Level: Alternating Memory: Decreased recall of precautions;Decreased short-term memory Following Commands: Follows one step commands inconsistently;Follows one step commands with increased time Safety/Judgement: Decreased awareness of safety;Decreased awareness of deficits Awareness: Intellectual Problem Solving: Slow processing;Decreased initiation;Difficulty sequencing;Requires verbal cues;Requires tactile cues General Comments: pt is alert however has cognitive deficits that impact pt's safety with ADL         General Comments General comments (skin integrity, edema, etc.): HR elevated to 133bpm at peak      Pertinent Vitals/Pain Pain Assessment: No/denies pain           PT Goals (current goals can now be found in the care plan section) Acute Rehab PT Goals Patient Stated Goal: none stated Progress towards PT goals: Progressing toward goals    Frequency    Min 2X/week  PT Plan Current plan  remains appropriate    Co-evaluation     PT goals addressed during session: Mobility/safety with mobility;Balance;Proper use of DME;Strengthening/ROM        AM-PAC PT "6 Clicks" Mobility   Outcome Measure  Help needed turning from your back to your side while in a flat bed without using bedrails?: A Little Help needed moving from lying on your back to sitting on the side of a flat bed without using bedrails?: A Little Help needed moving to and from a bed to a chair (including a wheelchair)?: A Little Help needed standing up from a chair using your arms (e.g., wheelchair or bedside chair)?: A Little Help needed to walk in hospital room?: A Little Help needed climbing 3-5 steps with a railing? : A Little 6 Click Score: 18    End of Session Equipment Utilized During Treatment: Gait belt Activity Tolerance: Patient tolerated treatment well Patient left: in chair;Other (comment) (RN staff aware pt returned to standard height chair with armrest without chair alarm in place.) Nurse Communication: Mobility status PT Visit Diagnosis: Unsteadiness on feet (R26.81);Muscle weakness (generalized) (M62.81);Difficulty in walking, not elsewhere classified (R26.2)     Time: 1416-1430 PT Time Calculation (min) (ACUTE ONLY): 14 min  Charges:  $Gait Training: 8-22 mins                     Julaine Fusi PTA 04/08/21, 3:42 PM

## 2021-04-08 NOTE — Progress Notes (Signed)
Seen examined and agree with my prior assessment and summary that I updated earlier today I received a call from [place] re: Patient being denied for SN-I called the Peer to peer and haven't heard back--dispo unclear at this time  I believe she needs supervision 2/2 her psychosis.  Attending in am to follow up re: plans as I am UNAVAILABLE after today.  Verneita Griffes, MD [facility] 9:12 PM

## 2021-04-08 NOTE — Progress Notes (Signed)
Mobility Specialist - Progress Note   04/08/21 1558  Mobility  Activity Refused mobility  Mobility performed by Mobility specialist    2nd attempt. Pt sitting in recliner, politely declining mobility session this date. Pt states she just wants to relax and "get my mind right". Will attempt session at another date/time as appropriate.    Kathee Delton Mobility Specialist 04/08/21, 3:59 PM

## 2021-04-09 LAB — BASIC METABOLIC PANEL
Anion gap: 13 (ref 5–15)
BUN: 5 mg/dL — ABNORMAL LOW (ref 8–23)
CO2: 21 mmol/L — ABNORMAL LOW (ref 22–32)
Calcium: 8.9 mg/dL (ref 8.9–10.3)
Chloride: 103 mmol/L (ref 98–111)
Creatinine, Ser: 0.94 mg/dL (ref 0.44–1.00)
GFR, Estimated: >60 mL/min (ref >60)
Glucose, Bld: 111 mg/dL — ABNORMAL HIGH (ref 70–99)
Potassium: 4.5 mmol/L (ref 3.5–5.1)
Sodium: 137 mmol/L (ref 135–145)

## 2021-04-09 LAB — CBC
HCT: 36.1 % (ref 36.0–46.0)
Hemoglobin: 11.4 g/dL — ABNORMAL LOW (ref 12.0–15.0)
MCH: 27.1 pg (ref 26.0–34.0)
MCHC: 31.6 g/dL (ref 30.0–36.0)
MCV: 86 fL (ref 80.0–100.0)
Platelets: 271 10*3/uL (ref 150–400)
RBC: 4.2 MIL/uL (ref 3.87–5.11)
RDW: 15.6 % — ABNORMAL HIGH (ref 11.5–15.5)
WBC: 9.1 10*3/uL (ref 4.0–10.5)
nRBC: 0 % (ref 0.0–0.2)

## 2021-04-09 NOTE — Progress Notes (Signed)
PROGRESS NOTE    Megan Oneal  OBS:[id number] DOB: 26-Jan-1956 DOA: 03/26/2021 PCP: Sharilyn Sites, MD  Brief Narrative:  82 femadmitted Megan Oneal 03/26/2021 unresponsive LK and a 6:30 AM that time-family found a bottle of Flexeril next patient had presented previously 03/24/2021 To [facility] ER auditory hallucinations-evaluated by psychiatry-Rx Risperdal and told to follow-up with PCP  Tachycardic-bibasilar atelectasis on arrival-CT head no acute abnormalities Tylenol level less than 10, salicylate level less than 7.0 UDS = + tricyclics, VBG 4.7/65/46  Patient intubated/sedated-LP unsuccessful-Rx acyclovir vancomycin Rocephin--CSF cult negative Extubated 4/9 with intermittent delusional thoughts MRI brain no acute findings EEG no seizures-generalized dysfunction Eventually psychiatry consulted/neurology consulted  Psychiatry reconsulted 04/02/2021-they felt potential steroid induced delirium might of contributed to some of her confusion and changed her Haldol to every 6 hours 1 mg only They also recommended continuation of Risperdal 2 mg, auditory hallucinations were probably secondary to psychotic depression and they discontinued her Depakote  4/20: Received notification from Megan Oneal of skilled nursing facility denial from insurance.  Called peer-to-peer line, requested callback, left cell phone number.  Pending callback as of 1505 on 4/20   Assessment & Plan:   Principal Problem:   Psychosis ([place]) Active Problems:   Acute encephalopathy  Acute toxic metabolic encephalopathy 2/2? Flexeril with persistence of encephalopathy Patient more somnolent 4/16 Intermittent bouts of agitation and aggression 4/14-psychiatry reconsulted-- Somnolent 4/16-cut back from 3 mg to risperdal 2- much more awake and coherent 4/17-discontinued safety sitter- work-up for other causes of encephalopathy (ammonia, hypercarbia were negative) EEG negative for focal seizures stable for d/c Plan: Discharge planning.   Apparently there is a denial in place.  I have called peer-to-peer line and pending a call back.  Auditory hallucinations followed previously [employer] of otosclerosis Prior history PTSD Will need outpatient Megan Oneal follow-up-should follow-up with either ENT at [place] 2mg --no Depakote 250 discontinued by psychiatry  Acute hypoxic respiratory failure-extubated 4/9 No oxygen requirement currently  CAD/HTN Tachycardia, constitutional hypotension Meds adjusted, discontinue verapamil discontinue losartan Consolidate Cardizem to 240 CD Can discontinue monitors as she does not wish to have them on and thinks that she is "being shocked" Leukocytosis secondary to Decadron Meningitis diagnosis entertained on admission-all cultures however negative Patient seen by infectious disease No further work-up and they have signed off 4/11  Hyperglycemia without diagnosis of DM TY 2-probably secondary to steroids given during hospital stay A1c 6.4 Outpatient consideration for med initiation as per PCP etc. No insulin on discharge  Hypokalemia Monitor trends in the outpatient setting as per my note  BMI 49.9 Life-threatening-outpatient discussion   DVT prophylaxis: SQ Lovenox Code Status: Full Family Communication: None today Disposition Plan: Status is: Inpatient  Remains inpatient appropriate because:Unsafe d/c plan   Dispo:  Patient From: [place]  Planned Disposition: [place]  Medically stable for discharge: Yes    Patient medically stable for discharge however disposition is unclear at this time.  Apparently there is a denial in skilled nursing facility.  I have called peer-to-peer line and pending a call back.  Patient may need to discharge home with home health services     Level of care: Med-Surg  Consultants:   None   Procedures: None  Antimicrobials:  None   Subjective: Patient seen and examined.   Sitting up in the chair.  No visible distress.  Flattened affect.  No pain complaints.  I have made the patient aware of the denial for skilled nursing facility.  Her family is aware  as well.  Objective: Vitals:   04/09/21 0341 04/09/21 0850 04/09/21 1229 04/09/21 1230  BP: 133/78 124/88 124/71   Pulse: (!) 110 (!) 107 (!) 111 (!) 108  Resp: 18 17 18    Temp: 98.5 F (36.9 C) 98.6 F (37 C) 98.7 F (37.1 C)   TempSrc: Oral Oral Oral   SpO2: 98% 98% 97% 99%  Weight:      Height:        Intake/Output Summary (Last 24 hours) at 04/09/2021 1504 Last data filed at 04/09/2021 1335 Gross per 24 hour  Intake 900 ml  Output 500 ml  Net 400 ml   Filed Weights   04/06/21 0505 04/07/21 0419 04/08/21 0455  Weight: (!) 139.6 kg (!) 138.6 kg (!) 137.5 kg    Examination:  General exam: No acute distress.  Appears lethargic Respiratory system: Poor respiratory effort.  Bibasilar crackles.  Normal work of breathing.  Room air Cardiovascular system: S1 & S2 heard, RRR. No JVD, murmurs, rubs, gallops or clicks. No pedal edema. Gastrointestinal system: Obese, nontender, nondistended, normal bowel sounds Central nervous system: Alert and oriented. No focal neurological deficits. Extremities: Symmetric 5 x 5 power. Skin: No rashes, lesions or ulcers Psychiatry: Judgement and insight appear impaired. Mood & affect flattened.     Data Reviewed: I have personally reviewed following labs and imaging studies  CBC: Recent Labs  Lab 04/03/21 0409 04/04/21 0621 04/06/21 0525 04/09/21 0749  WBC 16.3* 16.9* 11.1* 9.1  NEUTROABS 10.3* 10.9* 6.0  --   HGB 10.2* 10.7* 10.5* 11.4*  HCT 32.7* 33.0* 32.4* 36.1  MCV 87.0 84.0 86.2 86.0  PLT 233 252 258 767   Basic Metabolic Panel: Recent Labs  Lab 04/03/21 0409 04/04/21 0621 04/05/21 1625 04/06/21 0525 04/09/21 0749  NA 143 141 139 140 137  K 3.3* 3.8 3.1* 3.7 4.5  CL 108 106 106 104 103  CO2 26 25 24 25  21*  GLUCOSE 93 102* 111* 99 111*   BUN 10 9 8 8  5*  CREATININE 0.77 0.78 0.71 0.81 0.94  CALCIUM 8.3* 8.4* 8.1* 8.3* 8.9  MG 2.1 2.2  --   --   --    GFR: Estimated Creatinine Clearance: 87.6 mL/min (by C-G formula based on SCr of 0.94 mg/dL). Liver Function Tests: Recent Labs  Lab 04/05/21 1625  AST 25  ALT 32  ALKPHOS 49  BILITOT 0.7  PROT 6.0*  ALBUMIN 2.8*   No results for input(s): LIPASE, AMYLASE in the last 168 hours. Recent Labs  Lab 04/05/21 1625  AMMONIA 10   Coagulation Profile: No results for input(s): INR, PROTIME in the last 168 hours. Cardiac Enzymes: No results for input(s): CKTOTAL, CKMB, CKMBINDEX, TROPONINI in the last 168 hours. BNP (last 3 results) No results for input(s): PROBNP in the last 8760 hours. HbA1C: No results for input(s): HGBA1C in the last 72 hours. CBG: Recent Labs  Lab 04/07/21 1728 04/07/21 2101 04/08/21 0733 04/08/21 1130 04/08/21 1632  GLUCAP 121* 112* 115* 127* 106*   Lipid Profile: No results for input(s): CHOL, HDL, LDLCALC, TRIG, CHOLHDL, LDLDIRECT in the last 72 hours. Thyroid Function Tests: No results for input(s): TSH, T4TOTAL, FREET4, T3FREE, THYROIDAB in the last 72 hours. Anemia Panel: No results for input(s): VITAMINB12, FOLATE, FERRITIN, TIBC, IRON, RETICCTPCT in the last 72 hours. Sepsis Labs: No results for input(s): PROCALCITON, LATICACIDVEN in the last 168 hours.  Recent Results (from the past 240 hour(s))  SARS CORONAVIRUS 2 (TAT 6-24 HRS) Nasopharyngeal Nasopharyngeal  Swab     Status: None   Collection Time: 04/07/21  6:06 PM   Specimen: Nasopharyngeal Swab  Result Value Ref Range Status   SARS Coronavirus 2 NEGATIVE NEGATIVE Final    Comment: (NOTE) SARS-CoV-2 target nucleic acids are NOT DETECTED.  The SARS-CoV-2 RNA is generally detectable in upper and lower respiratory specimens during the acute phase of infection. Negative results do not preclude SARS-CoV-2 infection, do not rule out co-infections with other pathogens, and  should not be used as the sole basis for treatment or other patient management decisions. Negative results must be combined with clinical observations, patient history, and epidemiological information. The expected result is Negative.  Fact Sheet for Patients: SugarRoll.be  Fact Sheet for Healthcare Providers: https://www.woods-mathews.com/  This test is not yet approved or cleared by the [place] FDA and  has been authorized for detection and/or diagnosis of SARS-CoV-2 by FDA under an Emergency Use Authorization (EUA). This EUA will remain  in effect (meaning this test can be used) for the duration of the COVID-19 declaration under Se ction 564(b)(1) of the Act, 21 U.S.C. section 360bbb-3(b)(1), unless the authorization is terminated or revoked sooner.  Performed at [facility], [address] [place], [place] [address]          Radiology Studies: No results found.      Scheduled Meds: . aspirin  81 mg Oral Daily  . chlorhexidine  15 mL Mouth Rinse BID  . Chlorhexidine Gluconate Cloth  6 each Topical Daily  . diltiazem  240 mg Oral Daily  . enoxaparin (LOVENOX) injection  0.5 mg/kg Subcutaneous Q24H  . famotidine  20 mg Oral QHS  . feeding supplement  237 mL Oral TID BM  . mouth rinse  15 mL Mouth Rinse q12n4p  . potassium chloride  40 mEq Oral BID  . risperiDONE  2 mg Oral QHS   Continuous Infusions:   LOS: 14 days    Time spent: 25 minutes    Sidney Ace, MD [facility] Pager [phone]  If 7PM-7AM, please contact night-coverage 04/09/2021, 3:04 PM

## 2021-04-09 NOTE — Progress Notes (Signed)
Mobility Specialist - Progress Note   04/09/21 1100  Mobility  Activity Ambulated in hall  Level of Assistance Independent  Assistive Device None  Distance Ambulated (ft) 180 ft  Mobility Response Tolerated well  Mobility performed by Mobility specialist  $Mobility charge 1 Mobility    Pt ambulated in hallway without AD. No LOB. Denied weakness/pain. Stated "I'm breathing a little hard, but no shortness of breath". Max HR 128 bpm, O2 maintained high 90s throughout session on RA.    Kathee Delton Mobility Specialist 04/09/21, 11:41 AM

## 2021-04-10 MED ORDER — IBUPROFEN 600 MG PO TABS
600.0000 mg | ORAL_TABLET | Freq: Four times a day (QID) | ORAL | 0 refills | Status: DC | PRN
Start: 2021-04-10 — End: 2021-04-18

## 2021-04-10 MED ORDER — DILTIAZEM HCL ER COATED BEADS 240 MG PO CP24: 240.0000 mg | ORAL_CAPSULE | Freq: Every day | ORAL | 0 refills | Status: DC

## 2021-04-10 NOTE — Discharge Summary (Signed)
Physician Discharge Summary  Megan Oneal NOB:[id number] DOB: 23-Jul-1956 DOA: 03/26/2021  PCP: Sharilyn Sites, MD  Admit date: 03/26/2021 Discharge date: 04/10/2021  Admitted From: Home Disposition: Home with home health  Recommendations for Outpatient Follow-up:  1. Follow up with PCP in 1-2 weeks 2. Follow up with the [facility] program  Home Health: Yes Equipment/Devices: No Discharge Condition: Stable CODE STATUS: Full Diet recommendation: Heart healthy Brief/Interim Summary: 17fem[facility]mitted [facility] 03/26/2021 unresponsive LK and a 6:30 AM that time-family found a bottle of Flexeril next patient had presented previously 03/24/2021 To [facility] ER auditory hallucinations-evaluated by psychiatry-Rx Risperdal and told to follow-up with PCP  Tachycardic-bibasilar atelectasis on arrival-CT head no acute abnormalities Tylenol level less than 10, salicylate level less than 7.0 UDS = + tricyclics, VBG 9.4/76/54  Patient intubated/sedated-LP unsuccessful-Rx acyclovir vancomycin Rocephin--CSF cult negative Extubated 4/9 with intermittent delusional thoughts MRI brain no acute findings EEG no seizures-generalized dysfunction Eventually psychiatry consulted/neurology consulted  Psychiatry reconsulted 04/02/2021-they felt potential steroid induced delirium might of contributed to some of her confusion and changed her Haldol to every 6 hours 1 mg only They also recommended continuation of Risperdal 2 mg, auditory hallucinations were probably secondary to psychotic depression and they discontinued her Depakote  4/20: Received notification from [facility] of skilled nursing facility denial from insurance.  Called peer-to-peer line, requested callback, left cell phone number.  Pending callback as of 1505 on 4/20  4/21: Discussed with Dr. Jerene Pitch from [place] peer to peer.  Skilled nursing facility denial has been upheld.  Discussed with patient and sister via phone.  Full scope of home health services have been ordered.   Patient will discharge home with home health.  Discharge Diagnoses:  Principal Problem:   Psychosis ([place]) Active Problems:   Acute encephalopathy  Acute toxic metabolic encephalopathy 2/2? Flexeril with persistence of encephalopathy Patient more somnolent 4/16 Intermittent bouts of agitation and aggression 4/14-psychiatry reconsulted-- Somnolent 4/16-cut back from 3 mg to risperdal 2- much more awake and coherent 4/17-discontinued safety sitter- work-up for other causes of encephalopathy (ammonia, hypercarbia were negative) EEG negative for focal seizures stable for d/c Plan:  Discharge home with home health.  Initial attempts to secure skilled nursing facility placement have been unsuccessful.  [place] has upheld the denial.  Full scope of home health services have been ordered.  Auditory hallucinations followed previously [employer] of otosclerosis Prior history PTSD Will need outpatient [facility] follow-up-should follow-up with either ENT at [place] 2mg --no Depakote 250 discontinued by psychiatry  Acute hypoxic respiratory failure-extubated 4/9 No oxygen requirement currently  CAD/HTN Tachycardia, constitutional hypotension Meds adjusted, discontinue verapamil discontinue losartan Consolidate Cardizem to 240 CD Can discontinue monitors as she does not wish to have them on and thinks that she is "being shocked" Leukocytosis secondary to Decadron Meningitis diagnosis entertained on admission-all cultures however negative Patient seen by infectious disease No further work-up and they have signed off 4/11  Hyperglycemia without diagnosis of DM TY 2-probably secondary to steroids given during hospital stay A1c 6.4 Outpatient consideration for med initiation as per PCP etc. No insulin on discharge  Hypokalemia Monitor trendsin the outpatient setting as per my note  BMI 49.9 Life-threatening-outpatient discussion  Discharge  Instructions  Discharge Instructions    Diet - low sodium heart healthy   Complete by: As directed    Increase activity slowly   Complete by: As directed    Increase activity slowly   Complete by: As directed      Allergies as of 04/10/2021  Reactions   Metoprolol Other (See Comments)   Fatigue and malaise; wheezing   Sulfa Antibiotics       Medication List    STOP taking these medications   cyclobenzaprine 10 MG tablet Commonly known as: FLEXERIL   diltiazem 30 MG tablet Commonly known as: CARDIZEM   escitalopram 10 MG tablet Commonly known as: LEXAPRO   famotidine 20 MG tablet Commonly known as: PEPCID   HYDROcodone-acetaminophen 5-325 MG tablet Commonly known as: NORCO/VICODIN   levocetirizine 5 MG tablet Commonly known as: XYZAL   losartan 100 MG tablet Commonly known as: COZAAR   meclizine 25 MG tablet Commonly known as: ANTIVERT   megestrol 20 MG tablet Commonly known as: MEGACE   meloxicam 15 MG tablet Commonly known as: MOBIC   rosuvastatin 5 MG tablet Commonly known as: CRESTOR   verapamil 240 MG 24 hr capsule Commonly known as: VERELAN PM     TAKE these medications   aspirin 81 MG tablet Take 81 mg by mouth daily.   cholecalciferol 25 MCG (1000 UNIT) tablet Commonly known as: VITAMIN D3 Take 1,000 Units by mouth daily.   dexlansoprazole 60 MG capsule Commonly known as: DEXILANT Take 60 mg by mouth daily.   diltiazem 240 MG 24 hr capsule Commonly known as: CARDIZEM CD Take 1 capsule (240 mg total) by mouth daily.   ferrous sulfate 325 (65 FE) MG tablet Take 325 mg by mouth daily with breakfast.   fluticasone 50 MCG/ACT nasal spray Commonly known as: FLONASE Place 2 sprays into both nostrils daily as needed for allergies.   furosemide 40 MG tablet Commonly known as: LASIX Take 40 mg by mouth every other day.   ibuprofen 600 MG tablet Commonly known as: ADVIL Take 1 tablet (600 mg total) by mouth every 6 (six) hours as  needed for moderate pain.   ipratropium 0.03 % nasal spray Commonly known as: ATROVENT Place 2 sprays into both nostrils 3 (three) times daily as needed for rhinitis.   polyethylene glycol powder 17 GM/SCOOP powder Commonly known as: GLYCOLAX/MIRALAX 1 scoop daily or as needed   prednisoLONE acetate 1 % ophthalmic suspension Commonly known as: PRED FORTE 1 drop 4 (four) times daily.   Premarin vaginal cream Generic drug: conjugated estrogens PLACE 1 APPLICATORFUL VAGINALLY DAILY.   risperiDONE 2 MG tablet Commonly known as: RISPERDAL Take 1 tablet (2 mg total) by mouth at bedtime. What changed:   medication strength  how much to take  when to take this   vitamin C 500 MG tablet Commonly known as: ASCORBIC ACID Take 500 mg by mouth daily.            Durable Medical Equipment  (From admission, onward)         Start     Ordered   04/10/21 1042  For home use only DME Walker  Once       Question:  Patient needs a walker to treat with the following condition  Answer:  Weakness   04/10/21 1041   04/10/21 1042  For home use only DME 3 n 1  Once        04/10/21 1041          Contact information for follow-up providers    Sharilyn Sites, MD. Go on 04/17/2021.   Specialty: Family Medicine Why: Check  in at 1:30 pm;  Appt @ 2:00 pm Contact information: [address] [place] [address] [phone]        Arnoldo Lenis,  MD.   Specialty: Cardiology Contact information: [place] [address] [phone]            Contact information for after-discharge care    [facility] .   Service: Skilled Nursing Contact information: [place] [phone]                 Allergies  Allergen Reactions  . Metoprolol Other (See Comments)    Fatigue and malaise; wheezing  . Sulfa Antibiotics      Consultations:  Psychiatry   Procedures/Studies: DG Chest 1 View  Result Date: 03/28/2021 CLINICAL DATA:  Hypoxia.  Central catheter placement EXAM: CHEST  1 VIEW COMPARISON:  March 28, 2021 study obtained earlier in the day FINDINGS: Central catheter tip is at the cavoatrial junction. Endotracheal tube tip is 3.1 cm above the carina. Nasogastric tube tip and side port in stomach. No pneumothorax. There is mild left base atelectasis. The lungs elsewhere are clear. Heart is enlarged with pulmonary vascularity within normal limits. No adenopathy. No bone lesions. IMPRESSION: Tube and catheter positions as described without pneumothorax. No edema or consolidation. Left base atelectasis. There is cardiomegaly. No adenopathy. Electronically Signed   By: Lowella Grip III M.D.   On: 03/28/2021 13:11   CT Head Wo Contrast  Result Date: 03/26/2021 CLINICAL DATA:  Change in mental status.  Unknown cause. EXAM: CT HEAD WITHOUT CONTRAST TECHNIQUE: Contiguous axial images were obtained from the base of the skull through the vertex without intravenous contrast. COMPARISON:  MRI brain 03/21/2021.  CT head 03/21/2021 FINDINGS: Brain: No evidence of acute infarction, hemorrhage, hydrocephalus, extra-axial collection or mass lesion/mass effect. Vascular: No hyperdense vessel or unexpected calcification. Skull: Calvarium appears intact. Sinuses/Orbits: Paranasal sinuses and mastoid air cells are clear. Other: Congenital nonunion of the posterior arch of C1. IMPRESSION: No acute intracranial abnormalities. Electronically Signed   By: Lucienne Capers M.D.   On: 03/26/2021 19:08   CT Head Wo Contrast  Result Date: 03/21/2021 CLINICAL DATA:  Neuro deficit, acute, stroke suspected. Additional history provided: Patient reports dizziness EXAM: CT HEAD WITHOUT CONTRAST TECHNIQUE: Contiguous axial images were obtained from the base of the skull through the vertex without intravenous contrast. COMPARISON:  No pertinent  prior exams available for comparison. FINDINGS: Brain: Cerebral volume is normal. There is no acute intracranial hemorrhage. No demarcated cortical infarct. No extra-axial fluid collection. No evidence of intracranial mass. No midline shift. Vascular: No hyperdense vessel.  Atherosclerotic calcifications. Skull: Normal. Negative for fracture or focal lesion. Sinuses/Orbits: Visualized orbits show no acute finding. No significant paranasal sinus disease at the imaged levels. Other: Congenital nonunion of the posterior arch of C1. IMPRESSION: Unremarkable non-contrast CT appearance of the brain. No evidence of acute intracranial abnormality. Electronically Signed   By: Kellie Simmering DO   On: 03/21/2021 12:38   MR BRAIN WO CONTRAST  Result Date: 03/27/2021 CLINICAL DATA:  Mental status change. EXAM: MRI HEAD WITHOUT CONTRAST TECHNIQUE: Multiplanar, multiecho pulse sequences of the brain and surrounding structures were obtained without intravenous contrast. COMPARISON:  Head CT March 26, 2021 FINDINGS: Brain: No acute infarction, hemorrhage, hydrocephalus, extra-axial collection or mass lesion. A few small foci of T2 hyperintensity are seen within the white matter of the cerebral hemispheres, nonspecific. Vascular: Normal flow voids. Skull and upper cervical spine: Normal marrow signal. Sinuses/Orbits: Negative. IMPRESSION: 1. No acute intracranial abnormality. 2. A few small foci of T2 hyperintensity  within the white matter of the cerebral hemispheres, nonspecific but may be seen with chronic microvascular ischemic changes. Electronically Signed   By: Pedro Earls M.D.   On: 03/27/2021 14:43   MR BRAIN WO CONTRAST  Result Date: 03/21/2021 CLINICAL DATA:  Dizzy EXAM: MRI HEAD WITHOUT CONTRAST TECHNIQUE: Multiplanar, multiecho pulse sequences of the brain and surrounding structures were obtained without intravenous contrast. COMPARISON:  None. FINDINGS: Brain: There is no acute infarction or  intracranial hemorrhage. There is no intracranial mass, mass effect, or edema. There is no hydrocephalus or extra-axial fluid collection. Ventricles and sulci are normal in size and configuration. Few punctate foci of T2 hyperintensity are present in the supratentorial white matter and may reflect minor chronic microvascular ischemic changes or gliosis/demyelination of other etiologies. Vascular: Major vessel flow voids at the skull base are preserved. Skull and upper cervical spine: Normal marrow signal is preserved. Sinuses/Orbits: Trace mucosal thickening.  Orbits are unremarkable. Other: Sella is unremarkable.  Mastoid air cells are clear. IMPRESSION: No evidence of recent infarction, hemorrhage, or mass. Probable minor chronic microvascular ischemic changes. Electronically Signed   By: Macy Mis M.D.   On: 03/21/2021 14:53   DG Chest Port 1 View  Result Date: 03/28/2021 CLINICAL DATA:  Acute respiratory failure with hypoxia. EXAM: PORTABLE CHEST 1 VIEW COMPARISON:  03/26/2021 FINDINGS: Endotracheal and enteric tubes are unchanged in position. Shallow inspiration. Mild cardiac enlargement. Prominent pulmonary vascularity with bilateral basilar atelectasis. No definite pleural effusion or pneumothorax. Degenerative changes in the spine. IMPRESSION: Shallow inspiration with bilateral basilar atelectasis. Cardiac enlargement with pulmonary vascular congestion, similar to prior study. Electronically Signed   By: Lucienne Capers M.D.   On: 03/28/2021 01:21   DG Chest Portable 1 View  Result Date: 03/26/2021 CLINICAL DATA:  Intubation, placement of or of gastric tube, altered mental status EXAM: PORTABLE CHEST 1 VIEW COMPARISON:  Portable exam 1948 hours compared to 1803 hours FINDINGS: Tip of endotracheal tube projects 2.2 cm above carina. Orogastric tube extends into stomach. Enlargement of cardiac silhouette with vascular congestion. Mild RIGHT basilar atelectasis. Remaining lungs clear. No pleural  effusion or pneumothorax. IMPRESSION: Mild RIGHT basilar atelectasis. Tube positions as above. Electronically Signed   By: Lavonia Dana M.D.   On: 03/26/2021 20:17   DG Chest Portable 1 View  Result Date: 03/26/2021 CLINICAL DATA:  Altered mental status, hypertension, diabetes mellitus EXAM: PORTABLE CHEST 1 VIEW COMPARISON:  Portable exam 1812 hours compared to 03/22/2021 FINDINGS: Enlargement of cardiac silhouette with pulmonary vascular congestion. Mediastinal contour stable. Decreased lung volumes with minimal bibasilar atelectasis. No acute infiltrate, pleural effusion, or pneumothorax. IMPRESSION: Enlargement of cardiac silhouette. Low lung volumes with bibasilar atelectasis. Electronically Signed   By: Lavonia Dana M.D.   On: 03/26/2021 18:41   DG Chest Portable 1 View  Result Date: 03/22/2021 CLINICAL DATA:  Chest tightness. EXAM: PORTABLE CHEST 1 VIEW COMPARISON:  July 23, 2008 FINDINGS: Cardiomegaly. The hila and mediastinum are unremarkable. No pneumothorax. Mild interstitial prominence without overt edema. No suspicious infiltrates. IMPRESSION: Cardiomegaly. Suspected pulmonary venous congestion. No overt edema. Electronically Signed   By: Dorise Bullion III M.D   On: 03/22/2021 18:31   DG Abd Portable 1 View  Result Date: 03/26/2021 CLINICAL DATA:  Orogastric tube placement EXAM: PORTABLE ABDOMEN - 1 VIEW COMPARISON:  Portable exam 1949 hours without priors for comparison FINDINGS: Orogastric tube coiled in proximal stomach. Visualized bowel gas pattern normal. Prior lumbar fusion. Borderline enlargement of cardiac silhouette. Surgical clips RIGHT  upper quadrant likely reflect prior cholecystectomy. IMPRESSION: Nasogastric tube coiled in proximal stomach. Electronically Signed   By: Lavonia Dana M.D.   On: 03/26/2021 20:16   EEG adult  Result Date: 04/02/2021 Theodosia Blender, MD     04/02/2021  8:02 PM [facility] EEG HISTORY:  65 year old female presents with altered  mental status. INTRODUCTION:  A digital EEG was performed in the laboratory using the standard international 10/20 system of electrode placement in addition to one channel of EKG monitoring.  Hyperventilation was not performed. Photic Stimulation was performed.  This tracing captures wakefulness through drowsiness. This EEG was recording for about 20 minutes. DESCRIPTION OF RECORD:  In the most alert state the alpha rhythm is 9 Hz in frequency, which is seen in the occipital region and attenuates with eye opening and is bilaterally synchronous and symmetrical.  No focal slowing, sharp waves, or epileptiform discharges are seen. Photic stimulation was performed and did not produce any abnormalities. Heart rate was irregular at a rate of 120 bpm. IMPRESSION:  This is a normal adult EEG in the awake and drowsy states. No focal slowing, focal abnormalities, epileptiform discharges, or electrographic seizures are seen.  Of note, a normal EEG does not exclude the diagnosis of seizure disorder.  A repeat sleep deprived EEG or 24 hour EEG could be considered.  The abnormality on single lead EKG could be further evaluated by 12 lead EKG.  Clinical correlation is recommended.   DG FL GUIDED LUMBAR PUNCTURE  Result Date: 03/27/2021 CLINICAL DATA:  Acute encephalopathy. EXAM: DIAGNOSTIC LUMBAR PUNCTURE UNDER FLUOROSCOPIC GUIDANCE COMPARISON:  MRI lumbar spine 04/25/2008. FLUOROSCOPY TIME:  Fluoroscopy Time:  0 minutes 54 seconds Radiation Exposure Index (if provided by the fluoroscopic device): 42.9 mGy Number of Acquired Spot Images: 1 PROCEDURE: After discussing the risks and benefits of this procedure with the patient's sister informed consent was obtained. Risk of bleeding, infection, allergy, and need for blood patch discussed with patient's sister. With the patient prone, the lower back was prepped with Betadine. 1% Lidocaine was used for local anesthesia. Lumbar puncture was performed at the L5-S1 level using a 22  gauge needle with return of clear CSF. Opening pressure not obtained due to patient's clinical condition. 8 ml of CSF were obtained for laboratory studies. The patient tolerated the procedure well and there were no apparent complications. IMPRESSION: Successful fluoroscopically directed lumbar puncture. 8 cc of clear CSF obtained and sent to the laboratory for ordered studies. Electronically Signed   By: Marcello Moores  Register   On: 03/27/2021 14:59    (Echo, Carotid, EGD, Colonoscopy, ERCP)    Subjective: Seen and examined the day of discharge.  Explained that skilled nursing facility not an option and the patient was discharged home with home health.  She and the sister understand the situation.  No complaints at time of discharge.  Discharge Exam: Vitals:   04/10/21 0401 04/10/21 0743  BP: 120/61 127/78  Pulse: (!) 105 (!) 101  Resp: 18 18  Temp: 98.5 F (36.9 C) 98 F (36.7 C)  SpO2: 97% 96%   Vitals:   04/09/21 1923 04/10/21 0401 04/10/21 0500 04/10/21 0743  BP: 126/83 120/61  127/78  Pulse: (!) 109 (!) 105  (!) 101  Resp: 18 18  18   Temp: 99 F (37.2 C) 98.5 F (36.9 C)  98 F (36.7 C)  TempSrc:  Oral    SpO2: 98% 97%  96%  Weight:   135.7 kg   Height:  General: Pt is alert, awake, not in acute distress.  Flattened affect Cardiovascular: RRR, S1/S2 +, no rubs, no gallops Respiratory: CTA bilaterally, no wheezing, no rhonchi Abdominal: Obese, soft, NT, ND, bowel sounds + Extremities: no edema, no cyanosis    The results of significant diagnostics from this hospitalization (including imaging, microbiology, ancillary and laboratory) are listed below for reference.     Microbiology: Recent Results (from the past 240 hour(s))  SARS CORONAVIRUS 2 (TAT 6-24 HRS) Nasopharyngeal Nasopharyngeal Swab     Status: None   Collection Time: 04/07/21  6:06 PM   Specimen: Nasopharyngeal Swab  Result Value Ref Range Status   SARS Coronavirus 2 NEGATIVE NEGATIVE Final     Comment: (NOTE) SARS-CoV-2 target nucleic acids are NOT DETECTED.  The SARS-CoV-2 RNA is generally detectable in upper and lower respiratory specimens during the acute phase of infection. Negative results do not preclude SARS-CoV-2 infection, do not rule out co-infections with other pathogens, and should not be used as the sole basis for treatment or other patient management decisions. Negative results must be combined with clinical observations, patient history, and epidemiological information. The expected result is Negative.  Fact Sheet for Patients: SugarRoll.be  Fact Sheet for Healthcare Providers: https://www.woods-mathews.com/  This test is not yet approved or cleared by the [place] FDA and  has been authorized for detection and/or diagnosis of SARS-CoV-2 by FDA under an Emergency Use Authorization (EUA). This EUA will remain  in effect (meaning this test can be used) for the duration of the COVID-19 declaration under Se ction 564(b)(1) of the Act, 21 U.S.C. section 360bbb-3(b)(1), unless the authorization is terminated or revoked sooner.  Performed at [facility], [place] [address] [place], [place] [address]      Labs: BNP (last 3 results) Recent Labs    03/26/21 1732  BNP 82.4   Basic Metabolic Panel: Recent Labs  Lab 04/04/21 0621 04/05/21 1625 04/06/21 0525 04/09/21 0749  NA 141 139 140 137  K 3.8 3.1* 3.7 4.5  CL 106 106 104 103  CO2 25 24 25  21*  GLUCOSE 102* 111* 99 111*  BUN 9 8 8  5*  CREATININE 0.78 0.71 0.81 0.94  CALCIUM 8.4* 8.1* 8.3* 8.9  MG 2.2  --   --   --    Liver Function Tests: Recent Labs  Lab 04/05/21 1625  AST 25  ALT 32  ALKPHOS 49  BILITOT 0.7  PROT 6.0*  ALBUMIN 2.8*   No results for input(s): LIPASE, AMYLASE in the last 168 hours. Recent Labs  Lab 04/05/21 1625  AMMONIA 10   CBC: Recent Labs  Lab 04/04/21 0621 04/06/21 0525 04/09/21 0749  WBC 16.9* 11.1*  9.1  NEUTROABS 10.9* 6.0  --   HGB 10.7* 10.5* 11.4*  HCT 33.0* 32.4* 36.1  MCV 84.0 86.2 86.0  PLT 252 258 271   Cardiac Enzymes: No results for input(s): CKTOTAL, CKMB, CKMBINDEX, TROPONINI in the last 168 hours. BNP: Invalid input(s): POCBNP CBG: Recent Labs  Lab 04/07/21 1728 04/07/21 2101 04/08/21 0733 04/08/21 1130 04/08/21 1632  GLUCAP 121* 112* 115* 127* 106*   D-Dimer No results for input(s): DDIMER in the last 72 hours. Hgb A1c No results for input(s): HGBA1C in the last 72 hours. Lipid Profile No results for input(s): CHOL, HDL, LDLCALC, TRIG, CHOLHDL, LDLDIRECT in the last 72 hours. Thyroid function studies No results for input(s): TSH, T4TOTAL, T3FREE, THYROIDAB in the last 72 hours.  Invalid input(s): FREET3 Anemia work up No  results for input(s): VITAMINB12, FOLATE, FERRITIN, TIBC, IRON, RETICCTPCT in the last 72 hours. Urinalysis    Component Value Date/Time   COLORURINE COLORLESS (A) 03/26/2021 1726   APPEARANCEUR CLEAR (A) 03/26/2021 1726   APPEARANCEUR Clear 03/27/2014 0830   LABSPEC 1.002 (L) 03/26/2021 1726   LABSPEC 1.013 03/27/2014 0830   PHURINE 7.0 03/26/2021 1726   GLUCOSEU NEGATIVE 03/26/2021 1726   GLUCOSEU Negative 03/27/2014 0830   HGBUR SMALL (A) 03/26/2021 1726   BILIRUBINUR NEGATIVE 03/26/2021 1726   BILIRUBINUR Negative 03/27/2014 0830   KETONESUR 5 (A) 03/26/2021 1726   PROTEINUR NEGATIVE 03/26/2021 1726   NITRITE NEGATIVE 03/26/2021 1726   LEUKOCYTESUR NEGATIVE 03/26/2021 1726   LEUKOCYTESUR Trace 03/27/2014 0830   Sepsis Labs Invalid input(s): PROCALCITONIN,  WBC,  LACTICIDVEN Microbiology Recent Results (from the past 240 hour(s))  SARS CORONAVIRUS 2 (TAT 6-24 HRS) Nasopharyngeal Nasopharyngeal Swab     Status: None   Collection Time: 04/07/21  6:06 PM   Specimen: Nasopharyngeal Swab  Result Value Ref Range Status   SARS Coronavirus 2 NEGATIVE NEGATIVE Final    Comment: (NOTE) SARS-CoV-2 target nucleic acids are NOT  DETECTED.  The SARS-CoV-2 RNA is generally detectable in upper and lower respiratory specimens during the acute phase of infection. Negative results do not preclude SARS-CoV-2 infection, do not rule out co-infections with other pathogens, and should not be used as the sole basis for treatment or other patient management decisions. Negative results must be combined with clinical observations, patient history, and epidemiological information. The expected result is Negative.  Fact Sheet for Patients: SugarRoll.be  Fact Sheet for Healthcare Providers: https://www.woods-mathews.com/  This test is not yet approved or cleared by the [place] FDA and  has been authorized for detection and/or diagnosis of SARS-CoV-2 by FDA under an Emergency Use Authorization (EUA). This EUA will remain  in effect (meaning this test can be used) for the duration of the COVID-19 declaration under Se ction 564(b)(1) of the Act, 21 U.S.C. section 360bbb-3(b)(1), unless the authorization is terminated or revoked sooner.  Performed at [facility], [place] [address], [place], [place] [address]      Time coordinating discharge: Over 30 minutes  SIGNED:   Sidney Ace, MD  [facility] Hospitalists 04/10/2021, 10:52 AM Pager   If 7PM-7AM, please contact night-coverage

## 2021-04-10 NOTE — Progress Notes (Signed)
Physical Therapy Treatment Patient Details Name: Megan Oneal MRN: [id number] DOB: [birth year] Today's Date: 04/10/2021    History of Present Illness Pt is a 65 y/o female presenting with severe encephalopathy in the context of severe hypoxic respiratory failure requiring intubation. Underlying etiology most likely secondary to medication overdose at home; suspect Flexeril. PMH: HTN, DM2, DDD, anxiety, depression    PT Comments    Patient received sitting in recliner. Pleasant, agreeable to PT session. She is independent with sit to stand from recliner, ambulated in room to get shoes. She was able to ambulate 250 feet supervision. No lob, no equipment use or unsteadiness noted. Patient will continue ambulating with mobility specialist while here.       Follow Up Recommendations  No PT follow up     Equipment Recommendations  None recommended by PT    Recommendations for Other Services       Precautions / Restrictions Precautions Precautions: Fall Restrictions Weight Bearing Restrictions: No    Mobility  Bed Mobility               General bed mobility comments: Patient received in recliner and returned to recliner at end of session.    Transfers Overall transfer level: Needs assistance   Transfers: Sit to/from Stand Sit to Stand: Supervision         General transfer comment: patient performed sit to stand with supervision  Ambulation/Gait Ambulation/Gait assistance: Supervision Gait Distance (Feet): 250 Feet Assistive device: None Gait Pattern/deviations: Step-through pattern;Decreased step length - right;Decreased step length - left Gait velocity: decreased   General Gait Details: No lob this date. No reported difficulties. Patient required no physical assist or use of rail in hallway this visit.   Stairs             Wheelchair Mobility    Modified Rankin (Stroke Patients Only)       Balance Overall balance assessment:  Independent Sitting-balance support: Feet supported Sitting balance-Leahy Scale: Normal Sitting balance - Comments: supervision sitting EOB   Standing balance support: No upper extremity supported;During functional activity Standing balance-Leahy Scale: Good Standing balance comment: no lob or difficulty noted or retported this session.                            Cognition Arousal/Alertness: Awake/alert Behavior During Therapy: WFL for tasks assessed/performed Overall Cognitive Status: History of cognitive impairments - at baseline                         Following Commands: Follows one step commands consistently              Exercises      General Comments        Pertinent Vitals/Pain Pain Assessment: No/denies pain    Home Living                      Prior Function            PT Goals (current goals can now be found in the care plan section) Acute Rehab PT Goals Patient Stated Goal: none stated PT Goal Formulation: With patient Progress towards PT goals: Goals met/education completed, patient discharged from PT    Frequency           PT Plan Discharge plan needs to be updated    Co-evaluation  AM-PAC PT "6 Clicks" Mobility   Outcome Measure  Help needed turning from your back to your side while in a flat bed without using bedrails?: None Help needed moving from lying on your back to sitting on the side of a flat bed without using bedrails?: None Help needed moving to and from a bed to a chair (including a wheelchair)?: None Help needed standing up from a chair using your arms (e.g., wheelchair or bedside chair)?: None Help needed to walk in hospital room?: None Help needed climbing 3-5 steps with a railing? : None 6 Click Score: 24    End of Session   Activity Tolerance: Patient tolerated treatment well Patient left: in chair Nurse Communication: Mobility status       Time: 1130-1140 PT  Time Calculation (min) (ACUTE ONLY): 10 min  Charges:  $Gait Training: 8-22 mins                     [employer], PT, [employer] 04/10/21,12:03 PM

## 2021-04-10 NOTE — Care Management Important Message (Signed)
Important Message  Patient Details  Name: TEEGAN GUINTHER MRN: [id number] Date of Birth: 08/21/1956   Medicare Important Message Given:  Yes     Dannette Barbara 04/10/2021, 1:19 PM

## 2021-04-10 NOTE — Plan of Care (Signed)
  Problem: Health Behavior/Discharge Planning: Goal: Ability to manage health-related needs will improve Outcome: Adequate for Discharge   Problem: Clinical Measurements: Goal: Ability to maintain clinical measurements within normal limits will improve Outcome: Adequate for Discharge Goal: Will remain free from infection Outcome: Adequate for Discharge Goal: Diagnostic test results will improve Outcome: Adequate for Discharge Goal: Respiratory complications will improve Outcome: Adequate for Discharge Goal: Cardiovascular complication will be avoided Outcome: Adequate for Discharge   Problem: Activity: Goal: Risk for activity intolerance will decrease Outcome: Adequate for Discharge   Problem: Nutrition: Goal: Adequate nutrition will be maintained Outcome: Adequate for Discharge   Problem: Coping: Goal: Level of anxiety will decrease Outcome: Adequate for Discharge   Problem: Elimination: Goal: Will not experience complications related to bowel motility Outcome: Adequate for Discharge Goal: Will not experience complications related to urinary retention Outcome: Adequate for Discharge   Problem: Skin Integrity: Goal: Risk for impaired skin integrity will decrease Outcome: Adequate for Discharge

## 2021-04-10 NOTE — TOC Progression Note (Signed)
Transition of Care [facility]) - Progression Note    Patient Details  Name: Megan Oneal MRN: [id number] Date of Birth: 12-15-1956  Transition of Care [facility]) CM/SW [place], RN Phone Number: 04/10/2021, 10:03 AM  Clinical Narrative:   Called [place] with [facility] to request they accept the patient for PT, OT, RN, and Aide, she will call back to confirm, provided the sister Nicole's information to call to set up, Notified Rhonda at [place] that the patient will need a 3 in 1 and a RW to be delivered to the room to [facility] today     Expected Discharge Plan: Skilled Nursing Facility Barriers to Discharge: Insurance Authorization,Continued Medical Work up,Requiring sitter/restraints  Expected Discharge Plan and Services Expected Discharge Plan: [place] [facility]: [place] arrangements for the past 2 months: Single Family Home Expected Discharge Date: 04/08/21                                     Social Determinants of Health (SDOH) Interventions    Readmission Risk Interventions No flowsheet data found.

## 2021-04-10 NOTE — TOC Progression Note (Signed)
Transition of Care [facility]) - Progression Note    Patient Details  Name: Megan Oneal MRN: [id number] Date of Birth: 12-08-1956  Transition of Care [facility]) CM/SW [place], RN Phone Number: 04/10/2021, 9:57 AM  Clinical Narrative:   Spoke with the Patient's sister Megan Oneal on the phone Explained to her that the SNF was denied by insuracne, she stated that she didn't feel that the patient needed Physical help as much as Mental help, I provided her with the [facility] phone number and address and explained that they take walk ins Mon, Wed, and Fri 8-3, I explained I will set up [facility] and 3 in 1 and RW for home, The sister stated that she will pick up the patient for transport when she is ready if the nurse will call    Expected Discharge Plan: [place] Barriers to Discharge: Insurance Authorization,Continued Medical Work up,Requiring sitter/restraints  Expected Discharge Plan and Services Expected Discharge Plan: [place] [facility]: [place] arrangements for the past 2 months: Single Family Home Expected Discharge Date: 04/08/21                                     Social Determinants of Health (SDOH) Interventions    Readmission Risk Interventions No flowsheet data found.

## 2021-04-14 DIAGNOSIS — H8090 Unspecified otosclerosis, unspecified ear: Secondary | ICD-10-CM | POA: Diagnosis not present

## 2021-04-14 DIAGNOSIS — Z87891 Personal history of nicotine dependence: Secondary | ICD-10-CM | POA: Diagnosis not present

## 2021-04-14 DIAGNOSIS — I119 Hypertensive heart disease without heart failure: Secondary | ICD-10-CM | POA: Diagnosis not present

## 2021-04-14 DIAGNOSIS — E119 Type 2 diabetes mellitus without complications: Secondary | ICD-10-CM | POA: Diagnosis not present

## 2021-04-14 DIAGNOSIS — R69 Illness, unspecified: Secondary | ICD-10-CM | POA: Diagnosis not present

## 2021-04-14 DIAGNOSIS — G928 Other toxic encephalopathy: Secondary | ICD-10-CM | POA: Diagnosis not present

## 2021-04-14 DIAGNOSIS — Z86018 Personal history of other benign neoplasm: Secondary | ICD-10-CM | POA: Diagnosis not present

## 2021-04-14 DIAGNOSIS — N809 Endometriosis, unspecified: Secondary | ICD-10-CM | POA: Diagnosis not present

## 2021-04-14 DIAGNOSIS — I2510 Atherosclerotic heart disease of native coronary artery without angina pectoris: Secondary | ICD-10-CM | POA: Diagnosis not present

## 2021-04-14 DIAGNOSIS — Z6841 Body Mass Index (BMI) 40.0 and over, adult: Secondary | ICD-10-CM | POA: Diagnosis not present

## 2021-04-14 DIAGNOSIS — E669 Obesity, unspecified: Secondary | ICD-10-CM | POA: Diagnosis not present

## 2021-04-14 DIAGNOSIS — A86 Unspecified viral encephalitis: Secondary | ICD-10-CM | POA: Diagnosis not present

## 2021-04-14 DIAGNOSIS — M5136 Other intervertebral disc degeneration, lumbar region: Secondary | ICD-10-CM | POA: Diagnosis not present

## 2021-04-14 DIAGNOSIS — J9601 Acute respiratory failure with hypoxia: Secondary | ICD-10-CM | POA: Diagnosis not present

## 2021-04-15 ENCOUNTER — Emergency Department (HOSPITAL_COMMUNITY): Payer: Medicare HMO

## 2021-04-15 ENCOUNTER — Encounter (HOSPITAL_COMMUNITY): Payer: Self-pay | Admitting: *Deleted

## 2021-04-15 ENCOUNTER — Other Ambulatory Visit: Payer: Self-pay

## 2021-04-15 ENCOUNTER — Inpatient Hospital Stay (HOSPITAL_COMMUNITY)
Admission: EM | Admit: 2021-04-15 | Discharge: 2021-04-18 | DRG: 176 | Disposition: A | Discharge status: Home Health | Payer: Medicare HMO | Attending: Family Medicine | Admitting: Family Medicine

## 2021-04-15 DIAGNOSIS — Z7982 Long term (current) use of aspirin: Secondary | ICD-10-CM | POA: Diagnosis not present

## 2021-04-15 DIAGNOSIS — E119 Type 2 diabetes mellitus without complications: Secondary | ICD-10-CM | POA: Diagnosis present

## 2021-04-15 DIAGNOSIS — Z86711 Personal history of pulmonary embolism: Secondary | ICD-10-CM | POA: Diagnosis not present

## 2021-04-15 DIAGNOSIS — Z20822 Contact with and (suspected) exposure to covid-19: Secondary | ICD-10-CM | POA: Diagnosis not present

## 2021-04-15 DIAGNOSIS — Z833 Family history of diabetes mellitus: Secondary | ICD-10-CM

## 2021-04-15 DIAGNOSIS — Z6841 Body Mass Index (BMI) 40.0 and over, adult: Secondary | ICD-10-CM

## 2021-04-15 DIAGNOSIS — D649 Anemia, unspecified: Secondary | ICD-10-CM | POA: Diagnosis present

## 2021-04-15 DIAGNOSIS — I517 Cardiomegaly: Secondary | ICD-10-CM | POA: Diagnosis not present

## 2021-04-15 DIAGNOSIS — G8929 Other chronic pain: Secondary | ICD-10-CM | POA: Diagnosis present

## 2021-04-15 DIAGNOSIS — R69 Illness, unspecified: Secondary | ICD-10-CM | POA: Diagnosis not present

## 2021-04-15 DIAGNOSIS — Z79899 Other long term (current) drug therapy: Secondary | ICD-10-CM

## 2021-04-15 DIAGNOSIS — H9190 Unspecified hearing loss, unspecified ear: Secondary | ICD-10-CM | POA: Diagnosis present

## 2021-04-15 DIAGNOSIS — N179 Acute kidney failure, unspecified: Secondary | ICD-10-CM | POA: Diagnosis not present

## 2021-04-15 DIAGNOSIS — E669 Obesity, unspecified: Secondary | ICD-10-CM | POA: Diagnosis present

## 2021-04-15 DIAGNOSIS — I82890 Acute embolism and thrombosis of other specified veins: Secondary | ICD-10-CM | POA: Diagnosis not present

## 2021-04-15 DIAGNOSIS — I248 Other forms of acute ischemic heart disease: Secondary | ICD-10-CM | POA: Diagnosis not present

## 2021-04-15 DIAGNOSIS — I2510 Atherosclerotic heart disease of native coronary artery without angina pectoris: Secondary | ICD-10-CM | POA: Diagnosis present

## 2021-04-15 DIAGNOSIS — R440 Auditory hallucinations: Secondary | ICD-10-CM | POA: Diagnosis not present

## 2021-04-15 DIAGNOSIS — F4310 Post-traumatic stress disorder, unspecified: Secondary | ICD-10-CM | POA: Diagnosis present

## 2021-04-15 DIAGNOSIS — Z888 Allergy status to other drugs, medicaments and biological substances status: Secondary | ICD-10-CM

## 2021-04-15 DIAGNOSIS — M79604 Pain in right leg: Secondary | ICD-10-CM | POA: Diagnosis present

## 2021-04-15 DIAGNOSIS — M5136 Other intervertebral disc degeneration, lumbar region: Secondary | ICD-10-CM | POA: Diagnosis present

## 2021-04-15 DIAGNOSIS — Z91128 Patient's intentional underdosing of medication regimen for other reason: Secondary | ICD-10-CM | POA: Diagnosis not present

## 2021-04-15 DIAGNOSIS — I2699 Other pulmonary embolism without acute cor pulmonale: Secondary | ICD-10-CM | POA: Diagnosis not present

## 2021-04-15 DIAGNOSIS — R7401 Elevation of levels of liver transaminase levels: Secondary | ICD-10-CM | POA: Diagnosis not present

## 2021-04-15 DIAGNOSIS — R0602 Shortness of breath: Secondary | ICD-10-CM | POA: Diagnosis not present

## 2021-04-15 DIAGNOSIS — I10 Essential (primary) hypertension: Secondary | ICD-10-CM | POA: Diagnosis present

## 2021-04-15 DIAGNOSIS — N809 Endometriosis, unspecified: Secondary | ICD-10-CM | POA: Diagnosis present

## 2021-04-15 DIAGNOSIS — Z9114 Patient's other noncompliance with medication regimen: Secondary | ICD-10-CM

## 2021-04-15 DIAGNOSIS — I2692 Saddle embolus of pulmonary artery without acute cor pulmonale: Secondary | ICD-10-CM

## 2021-04-15 DIAGNOSIS — Z9851 Tubal ligation status: Secondary | ICD-10-CM

## 2021-04-15 DIAGNOSIS — T461X6A Underdosing of calcium-channel blockers, initial encounter: Secondary | ICD-10-CM | POA: Diagnosis present

## 2021-04-15 DIAGNOSIS — I2609 Other pulmonary embolism with acute cor pulmonale: Secondary | ICD-10-CM | POA: Diagnosis not present

## 2021-04-15 DIAGNOSIS — Z882 Allergy status to sulfonamides status: Secondary | ICD-10-CM

## 2021-04-15 DIAGNOSIS — I82411 Acute embolism and thrombosis of right femoral vein: Secondary | ICD-10-CM | POA: Diagnosis not present

## 2021-04-15 DIAGNOSIS — Z79890 Hormone replacement therapy (postmenopausal): Secondary | ICD-10-CM

## 2021-04-15 DIAGNOSIS — I82442 Acute embolism and thrombosis of left tibial vein: Secondary | ICD-10-CM | POA: Diagnosis present

## 2021-04-15 DIAGNOSIS — I700 Atherosclerosis of aorta: Secondary | ICD-10-CM | POA: Diagnosis not present

## 2021-04-15 DIAGNOSIS — Z87891 Personal history of nicotine dependence: Secondary | ICD-10-CM

## 2021-04-15 DIAGNOSIS — Z8249 Family history of ischemic heart disease and other diseases of the circulatory system: Secondary | ICD-10-CM

## 2021-04-15 LAB — COMPREHENSIVE METABOLIC PANEL
ALT: 135 U/L — ABNORMAL HIGH (ref 0–44)
AST: 49 U/L — ABNORMAL HIGH (ref 15–41)
Albumin: 3.6 g/dL (ref 3.5–5.0)
Alkaline Phosphatase: 64 U/L (ref 38–126)
Anion gap: 15 (ref 5–15)
BUN: 12 mg/dL (ref 8–23)
CO2: 20 mmol/L — ABNORMAL LOW (ref 22–32)
Calcium: 9.1 mg/dL (ref 8.9–10.3)
Chloride: 101 mmol/L (ref 98–111)
Creatinine, Ser: 1.27 mg/dL — ABNORMAL HIGH (ref 0.44–1.00)
GFR, Estimated: 48 mL/min — ABNORMAL LOW (ref >60)
Glucose, Bld: 121 mg/dL — ABNORMAL HIGH (ref 70–99)
Potassium: 4.3 mmol/L (ref 3.5–5.1)
Sodium: 136 mmol/L (ref 135–145)
Total Bilirubin: 0.6 mg/dL (ref 0.3–1.2)
Total Protein: 7.2 g/dL (ref 6.5–8.1)

## 2021-04-15 LAB — CBC WITH DIFFERENTIAL/PLATELET
Abs Immature Granulocytes: 0.06 10*3/uL (ref 0.00–0.07)
Basophils Absolute: 0.1 10*3/uL (ref 0.0–0.1)
Basophils Relative: 1 %
Eosinophils Absolute: 0.3 10*3/uL (ref 0.0–0.5)
Eosinophils Relative: 3 %
HCT: 41.7 % (ref 36.0–46.0)
Hemoglobin: 12.7 g/dL (ref 12.0–15.0)
Immature Granulocytes: 1 %
Lymphocytes Relative: 35 %
Lymphs Abs: 3.4 10*3/uL (ref 0.7–4.0)
MCH: 27.7 pg (ref 26.0–34.0)
MCHC: 30.5 g/dL (ref 30.0–36.0)
MCV: 90.8 fL (ref 80.0–100.0)
Monocytes Absolute: 0.8 10*3/uL (ref 0.1–1.0)
Monocytes Relative: 9 %
Neutro Abs: 4.8 10*3/uL (ref 1.7–7.7)
Neutrophils Relative %: 51 %
Platelets: 198 10*3/uL (ref 150–400)
RBC: 4.59 MIL/uL (ref 3.87–5.11)
RDW: 15.9 % — ABNORMAL HIGH (ref 11.5–15.5)
WBC: 9.5 10*3/uL (ref 4.0–10.5)
nRBC: 0 % (ref 0.0–0.2)

## 2021-04-15 LAB — PROTIME-INR
INR: 1.2 (ref 0.8–1.2)
Prothrombin Time: 15.5 seconds — ABNORMAL HIGH (ref 11.4–15.2)

## 2021-04-15 LAB — APTT: aPTT: 30 seconds (ref 24–36)

## 2021-04-15 LAB — RESP PANEL BY RT-PCR (FLU A&B, COVID) ARPGX2
Influenza A by PCR: NEGATIVE
Influenza B by PCR: NEGATIVE
SARS Coronavirus 2 by RT PCR: NEGATIVE

## 2021-04-15 LAB — TROPONIN I (HIGH SENSITIVITY)
Troponin I (High Sensitivity): 94 ng/L — ABNORMAL HIGH (ref <18)
Troponin I (High Sensitivity): 92 ng/L — ABNORMAL HIGH (ref <18)

## 2021-04-15 LAB — HEPARIN LEVEL (UNFRACTIONATED): Heparin Unfractionated: <0.1 IU/mL — ABNORMAL LOW (ref 0.30–0.70)

## 2021-04-15 MED ORDER — OXYCODONE HCL 5 MG PO TABS: 5.0000 mg | ORAL_TABLET | ORAL | Status: DC | PRN

## 2021-04-15 MED ORDER — HEPARIN SODIUM (PORCINE) 5000 UNIT/ML IJ SOLN: 5000.0000 [IU] | Freq: Three times a day (TID) | INTRAMUSCULAR | Status: DC

## 2021-04-15 MED ORDER — ONDANSETRON HCL 4 MG PO TABS: 4.0000 mg | ORAL_TABLET | Freq: Four times a day (QID) | ORAL | Status: DC | PRN

## 2021-04-15 MED ORDER — ONDANSETRON HCL 4 MG/2ML IJ SOLN: 4.0000 mg | Freq: Four times a day (QID) | INTRAMUSCULAR | Status: DC | PRN

## 2021-04-15 MED ORDER — IOHEXOL 350 MG/ML SOLN
100.0000 mL | Freq: Once | INTRAVENOUS | Status: AC | PRN
  Administered 2021-04-15: 100 mL via INTRAVENOUS

## 2021-04-15 MED ORDER — HEPARIN (PORCINE) 25000 UT/250ML-% IV SOLN
1600.0000 [IU]/h | INTRAVENOUS | Status: DC
  Administered 2021-04-15 – 2021-04-17 (×3): 1600 [IU]/h via INTRAVENOUS
  Filled 2021-04-15 (×3): qty 250

## 2021-04-15 MED ORDER — ACETAMINOPHEN 650 MG RE SUPP: 650.0000 mg | Freq: Four times a day (QID) | RECTAL | Status: DC | PRN

## 2021-04-15 MED ORDER — SODIUM CHLORIDE 0.9 % IV SOLN: INTRAVENOUS | Status: AC

## 2021-04-15 MED ORDER — HEPARIN BOLUS VIA INFUSION
5000.0000 [IU] | Freq: Once | INTRAVENOUS | Status: AC
  Administered 2021-04-15: 5000 [IU] via INTRAVENOUS

## 2021-04-15 MED ORDER — ASPIRIN 81 MG PO CHEW
81.0000 mg | CHEWABLE_TABLET | Freq: Every day | ORAL | Status: DC
  Administered 2021-04-16 – 2021-04-18 (×3): 81 mg via ORAL
  Filled 2021-04-15 (×3): qty 1

## 2021-04-15 MED ORDER — SODIUM CHLORIDE 0.9 % IV BOLUS
500.0000 mL | Freq: Once | INTRAVENOUS | Status: AC
  Administered 2021-04-15: 500 mL via INTRAVENOUS

## 2021-04-15 MED ORDER — FERROUS SULFATE 325 (65 FE) MG PO TABS
325.0000 mg | ORAL_TABLET | Freq: Every day | ORAL | Status: DC
  Administered 2021-04-16 – 2021-04-18 (×3): 325 mg via ORAL
  Filled 2021-04-15 (×3): qty 1

## 2021-04-15 MED ORDER — ACETAMINOPHEN 325 MG PO TABS: 650.0000 mg | ORAL_TABLET | Freq: Four times a day (QID) | ORAL | Status: DC | PRN

## 2021-04-15 MED ORDER — RISPERIDONE 1 MG PO TABS
2.0000 mg | ORAL_TABLET | Freq: Every day | ORAL | Status: DC
  Administered 2021-04-15 – 2021-04-17 (×3): 2 mg via ORAL
  Filled 2021-04-15 (×3): qty 2

## 2021-04-15 NOTE — ED Triage Notes (Signed)
Referred by her PCP FOR EVALUATION DUE TO SHORTNESS OF BREATH. SHORTNESS OF BREATH STARTED 2 DAYS AGO

## 2021-04-15 NOTE — ED Provider Notes (Signed)
[facility] EMERGENCY DEPARTMENT Provider Note   CSN: [id number] Arrival date & time: 04/15/21  1303     History Chief Complaint  Patient presents with  . Shortness of Breath    Nada BoozerMary B Bonfield is a 65 y.o. female with pertinent past medical history of anxiety, diabetes, hypertension with recent admission and discharged 4 days ago for acute encephalopathy where patient was intubated that presents the emergency department for acute onset of shortness of breath 2 days ago.  Patient states that she was feeling fine on Friday and Saturday, states that she had sudden onset of shortness of breath starting Sunday.  Has been persistent today.  States that she has sudden sharp shooting chest pains intermittently as well, not currently having any chest pain.  No history of blood clot, no recent long travel, surgery.  No leg swelling or calf tenderness.  Patient denies any cardiac history.  Patient states that she used to smoke tobacco, quit in 1995.  Denies any abdominal pain nausea vomiting fevers.  Has been vaccinated against COVID. No history of CHF.   HPI     Past Medical History:  Diagnosis Date  . Anxiety 03/12/2013  . Degenerative disc disease, lumbar    Epidural injections in 2007  . Diabetes mellitus without complication (HCC)   . Endometriosis   . Frequent PVCs    12,000/24 hours  . Hypertension   . Otalgia, unspecified   . Palpitations 10/04/2010  . Vascular bruit 01/2013   Right neck    Patient Active Problem List   Diagnosis Date Noted  . Pulmonary embolus (HCC) 04/15/2021  . Psychosis (HCC) 04/02/2021  . Acute encephalopathy 03/26/2021  . Auditory hallucinations 03/24/2021  . Otosclerosis 03/24/2021  . Encounter for screening colonoscopy 02/15/2019  . Obesity 03/12/2013  . Anxiety 03/12/2013  . Laboratory test 03/12/2013  . Frequent PVCs   . Degenerative disc disease, lumbar   . Hypertension   . Endometriosis   . Vascular bruit 01/21/2013  . Palpitations 10/04/2010     Past Surgical History:  Procedure Laterality Date  . BREAST SURGERY     benign tumor excised-age 89  . CARDIOVASCULAR STRESS TEST  03/13/2008   No scintigraphic evidence of inducible myocardial ischemia, EKG negative for ischemia, no ECG changes.  . COLONOSCOPY  2009  . COLONOSCOPY N/A 09/21/2019   Procedure: COLONOSCOPY;  Surgeon: Malissa Hippoehman, Najeeb U, MD;  Location: [facility];  Service: Endoscopy;  Laterality: N/A;  2:00-10:30  . CYST EXCISION  2007   Scalp  . DOPPLER ECHOCARDIOGRAPHY  03/13/2008   EF >55%, LV systolic function is normal, LV wall function is normal  . LAPAROSCOPIC CHOLECYSTECTOMY  2010  . LUMBAR LAMINECTOMY  2009    L4-L5 and L5-S1 laminectomy with diskectomy; transforaminal  . POLYPECTOMY  09/21/2019   Procedure: POLYPECTOMY;  Surgeon: Malissa Hippoehman, Najeeb U, MD;  Location: [facility];  Service: Endoscopy;;  . REPLACEMENT TOTAL KNEE    . SLEEP STUDY  10/29/2009   AHI-1.8/hr, during REM 6.4/hr; RDI-8.0/hr, during REM 15.0/hr; avg oxygen during REM and NREM was 94%  . TOTAL KNEE ARTHROPLASTY Bilateral   . TUBAL LIGATION       OB History    Gravida  3   Para  3   Term      Preterm      AB      Living  3     SAB      IAB      Ectopic  Multiple      Live Births              Family History  Problem Relation Age of Onset  . Diabetes Mother   . Hypertension Mother   . Diabetes Brother   . Hypertension Brother   . Hypertension Father     Social History   Tobacco Use  . Smoking status: Former Smoker    Packs/day: 2.00    Years: 20.00    Pack years: 40.00    Types: Cigarettes    Start date: 08/03/1972    Quit date: 08/03/1996    Years since quitting: 24.7  . Smokeless tobacco: Former Systems developer    Quit date: 12/21/1994  Substance Use Topics  . Alcohol use: No  . Drug use: No    Home Medications Prior to Admission medications   Medication Sig Start Date End Date Taking? Authorizing Provider  aspirin 81 MG tablet Take 81 mg by mouth  daily.    [provider]  cholecalciferol (VITAMIN D3) 25 MCG (1000 UT) tablet Take 1,000 Units by mouth daily.    [provider]  dexlansoprazole (DEXILANT) 60 MG capsule Take 60 mg by mouth daily.    [provider]  diltiazem (CARDIZEM CD) 240 MG 24 hr capsule Take 1 capsule (240 mg total) by mouth daily. 04/10/21 05/10/21  Sidney Ace, MD  ferrous sulfate 325 (65 FE) MG tablet Take 325 mg by mouth daily with breakfast.     [provider]  fluticasone (FLONASE) 50 MCG/ACT nasal spray Place 2 sprays into both nostrils daily as needed for allergies.  Patient not taking: Reported on 03/27/2021 07/24/19   [provider]  furosemide (LASIX) 40 MG tablet Take 40 mg by mouth every other day.     [provider]  ibuprofen (ADVIL) 600 MG tablet Take 1 tablet (600 mg total) by mouth every 6 (six) hours as needed for moderate pain. 04/10/21   Ralene Muskrat B, MD  ipratropium (ATROVENT) 0.03 % nasal spray Place 2 sprays into both nostrils 3 (three) times daily as needed for rhinitis.    [provider]  polyethylene glycol powder (GLYCOLAX/MIRALAX) powder 1 scoop daily or as needed Patient not taking: Reported on 03/27/2021 06/07/18   Florian Buff, MD  prednisoLONE acetate (PRED FORTE) 1 % ophthalmic suspension 1 drop 4 (four) times daily.    [provider]  PREMARIN vaginal cream PLACE 1 APPLICATORFUL VAGINALLY DAILY. 12/30/20   Florian Buff, MD  risperiDONE (RISPERDAL) 2 MG tablet Take 1 tablet (2 mg total) by mouth at bedtime. 04/07/21   Nita Sells, MD  vitamin C (ASCORBIC ACID) 500 MG tablet Take 500 mg by mouth daily.    [provider]    Allergies    Metoprolol and Sulfa antibiotics  Review of Systems   Review of Systems  Constitutional: Negative for chills, diaphoresis, fatigue and fever.  HENT: Negative for congestion, sore throat and trouble swallowing.   Eyes: Negative for pain and visual  disturbance.  Respiratory: Positive for shortness of breath. Negative for cough and wheezing.   Cardiovascular: Positive for chest pain. Negative for palpitations and leg swelling.  Gastrointestinal: Negative for abdominal distention, abdominal pain, diarrhea, nausea and vomiting.  Genitourinary: Negative for difficulty urinating.  Musculoskeletal: Negative for back pain, neck pain and neck stiffness.  Skin: Negative for pallor.  Neurological: Negative for dizziness, speech difficulty, weakness and headaches.  Psychiatric/Behavioral: Negative for confusion.    Physical  Exam Updated Vital Signs BP (!) 114/91   Pulse (!) 130   Temp 97.7 F (36.5 C) (Oral)   Resp (!) 21   Ht 5\' 6"  (1.676 m)   Wt (!) 150.1 kg   SpO2 94%   BMI 53.42 kg/m   Physical Exam Constitutional:      General: She is in acute distress.     Appearance: Normal appearance. She is not ill-appearing, toxic-appearing or diaphoretic.  HENT:     Mouth/Throat:     Mouth: Mucous membranes are moist.     Pharynx: Oropharynx is clear.  Eyes:     General: No scleral icterus.    Extraocular Movements: Extraocular movements intact.     Pupils: Pupils are equal, round, and reactive to light.  Cardiovascular:     Rate and Rhythm: Regular rhythm. Tachycardia present.     Pulses: Normal pulses.     Heart sounds: Normal heart sounds.  Pulmonary:     Effort: Tachypnea present. No accessory muscle usage or respiratory distress.     Breath sounds: Normal breath sounds. No stridor. No wheezing, rhonchi or rales.  Chest:     Chest wall: No tenderness.  Abdominal:     General: Abdomen is flat. There is no distension.     Palpations: Abdomen is soft.     Tenderness: There is no abdominal tenderness. There is no guarding or rebound.  Musculoskeletal:        General: No swelling or tenderness. Normal range of motion.     Cervical back: Normal range of motion and neck supple. No rigidity.     Right lower leg: No edema.      Left lower leg: No edema.  Skin:    General: Skin is warm and dry.     Capillary Refill: Capillary refill takes less than 2 seconds.     Coloration: Skin is not pale.  Neurological:     General: No focal deficit present.     Mental Status: She is alert and oriented to person, place, and time.  Psychiatric:        Mood and Affect: Mood normal.        Behavior: Behavior normal.     ED Results / Procedures / Treatments   Labs (all labs ordered are listed, but only abnormal results are displayed) Labs Reviewed  COMPREHENSIVE METABOLIC PANEL - Abnormal; Notable for the following components:      Result Value   CO2 20 (*)    Glucose, Bld 121 (*)    Creatinine, Ser 1.27 (*)    AST 49 (*)    ALT 135 (*)    GFR, Estimated 48 (*)    All other components within normal limits  CBC WITH DIFFERENTIAL/PLATELET - Abnormal; Notable for the following components:   RDW 15.9 (*)    All other components within normal limits  PROTIME-INR - Abnormal; Notable for the following components:   Prothrombin Time 15.5 (*)    All other components within normal limits  TROPONIN I (HIGH SENSITIVITY) - Abnormal; Notable for the following components:   Troponin I (High Sensitivity) 94 (*)    All other components within normal limits  RESP PANEL BY RT-PCR (FLU A&B, COVID) ARPGX2  APTT  HEPARIN LEVEL (UNFRACTIONATED)  TROPONIN I (HIGH SENSITIVITY)    EKG None  Radiology DG Chest 2 View  Result Date: 04/15/2021 CLINICAL DATA:  Shortness of breath. EXAM: CHEST - 2 VIEW COMPARISON:  03/28/2021 FINDINGS: Heart size is normal.  No pleural effusion or edema identified. No airspace densities identified. Mild spondylosis in the thoracic spine. Review of the visualized osseous structures are unremarkable. IMPRESSION: No active cardiopulmonary abnormalities. Electronically Signed   By: Kerby Moors M.D.   On: 04/15/2021 16:23   CT Angio Chest PE W/Cm &/Or Wo Cm  Result Date: 04/15/2021 CLINICAL DATA:  PE  suspected high probability, shortness of breath x2 days. EXAM: CT ANGIOGRAPHY CHEST WITH CONTRAST TECHNIQUE: Multidetector CT imaging of the chest was performed using the standard protocol during bolus administration of intravenous contrast. Multiplanar CT image reconstructions and MIPs were obtained to evaluate the vascular anatomy. CONTRAST:  145mL OMNIPAQUE IOHEXOL 350 MG/ML SOLN COMPARISON:  Chest radiograph from same day and March 28, 2021. FINDINGS: Cardiovascular: Satisfactory opacification of the pulmonary arteries to the segmental level. Saddle type pulmonary embolus at the takeoff of the bilateral main pulmonary artery branches with extension into the bilateral lobar and segmental pulmonary arteries. The right ventricular to left ventricular ratio is 1.5 with enlargement of the main pulmonary artery measuring 3.2 cm as well as reflux of contrast into the IVC and hepatic veins. Right-sided cardiac enlargement. No suspicious intracardiac filling defect visualized. No pericardial effusion. A few calcified aortic atherosclerotic plaques. No thoracic aortic aneurysm. Mediastinum/Nodes: Hypodense 8 mm nodule in the left lobe of the thyroid Not clinically significant; no follow-up imaging recommended (ref: J Am Coll Radiol. 2015 Feb;12(2): 143-50).No pathologically enlarged mediastinal, hilar or axillary lymph nodes. Trachea esophagus are grossly unremarkable. Lungs/Pleura: Lungs are clear. No pleural effusion or pneumothorax. Upper Abdomen: No acute abnormality. Musculoskeletal: No chest wall abnormality. No acute or significant osseous findings. Review of the MIP images confirms the above findings. Critical Value/emergent results were called by telephone at the time of interpretation on 04/15/2021 at 6:28 pm to provider Dr Roderic Palau , who verbally acknowledged these results. IMPRESSION: 1. Sinal type pulmonary embolus with CT evidence of right heart strain (RV/LV Ratio 1.5) consistent with at least submassive  (intermediate risk) PE. The presence of right heart strain has been associated with an increased risk of morbidity and mortality. 2. Aortic atherosclerosis.  Aortic Atherosclerosis (ICD10-I70.0). Critical Value/emergent results were called by telephone at the time of interpretation on 04/15/2021 at 6:28 pm to provider Dr Roderic Palau , who verbally acknowledged these results. Electronically Signed   By: Dahlia Bailiff MD   On: 04/15/2021 18:34    Procedures .Critical Care Performed by: Alfredia Client, PA-C Authorized by: Alfredia Client, PA-C   Critical care provider statement:    Critical care time (minutes):  45   Critical care was time spent personally by me on the following activities:  Discussions with consultants, evaluation of patient's response to treatment, examination of patient, ordering and performing treatments and interventions, ordering and review of laboratory studies, ordering and review of radiographic studies, pulse oximetry, re-evaluation of patient's condition, obtaining history from patient or surrogate and review of old charts     Medications Ordered in ED Medications  heparin ADULT infusion 100 units/mL (25000 units/270mL) (1,600 Units/hr Intravenous New Bag/Given 04/15/21 1904)  sodium chloride 0.9 % bolus 500 mL (0 mLs Intravenous Stopped 04/15/21 1603)  iohexol (OMNIPAQUE) 350 MG/ML injection 100 mL (100 mLs Intravenous Contrast Given 04/15/21 1754)  heparin bolus via infusion 5,000 Units (5,000 Units Intravenous Bolus from Bag 04/15/21 1904)    ED Course  I have reviewed the triage vital signs and the nursing notes.  Pertinent labs & imaging results that were available during my care of the patient  were reviewed by me and considered in my medical decision making (see chart for details).    MDM Rules/Calculators/A&P                         MONZERAT HANDLER is a 65 y.o. female with pertinent past medical history of anxiety, diabetes, hypertension with recent admission and  discharged 4 days ago for acute encephalopathy where patient was intubated that presents the emergency department for acute onset of shortness of breath 2 days ago.  Patient is in acute distress, tachycardic and tachypneic, high concerns for PE at this time.  Will give small bolus of fluid for tachycardia.   Work-up today shows saddle PE with right heart strain, patient is hemodynamically stable, is not requiring oxygen at this time.  Will consult intensivist due to right heart strain.  Spoke to Dr. Ruthann Cancer, intensivist who will consult in the morning.  Spoke to hospitalist, Dr. Orlin Hilding, who will admit patient.  Heparin is been initiated.  Patient has been updated on plan.  The patient appears reasonably stabilized for admission considering the current resources, flow, and capabilities available in the ED at this time, and I doubt any other [facility] requiring further screening and/or treatment in the ED prior to admission.  I discussed this case with my attending physician who cosigned this note including patient's presenting symptoms, physical exam, and planned diagnostics and interventions. Attending physician stated agreement with plan or made changes to plan which were implemented.   Attending physician assessed patient at bedside.  Final Clinical Impression(s) / ED Diagnoses Final diagnoses:  Acute saddle pulmonary embolism, unspecified whether acute cor pulmonale present [facility])    Rx / DC Orders ED Discharge Orders    None       Alfredia Client, PA-C 04/15/21 1913    Isla Pence, MD 04/17/21 1542

## 2021-04-15 NOTE — Progress Notes (Signed)
ANTICOAGULATION CONSULT NOTE - Initial Consult  Pharmacy Consult for heparin Indication: pulmonary embolus  Allergies  Allergen Reactions  . Metoprolol Other (See Comments)    Fatigue and malaise; wheezing  . Sulfa Antibiotics     Patient Measurements: Height: 5\' 6"  (167.6 cm) Weight: (!) 150.1 kg (331 lb) IBW/kg (Calculated) : 59.3 Heparin Dosing Weight: 96.9 kg  Vital Signs: Temp: 97.7 F (36.5 C) (04/26 1417) Temp Source: Oral (04/26 1417) BP: 116/69 (04/26 1830) Pulse Rate: 132 (04/26 1830)  Labs: Recent Labs    04/15/21 1635  HGB 12.7  HCT 41.7  PLT 198  CREATININE 1.27*  TROPONINIHS 94*    Estimated Creatinine Clearance: 68.4 mL/min (A) (by C-G formula based on SCr of 1.27 mg/dL (H)).   Medical History: Past Medical History:  Diagnosis Date  . Anxiety 03/12/2013  . Degenerative disc disease, lumbar    Epidural injections in 2007  . Diabetes mellitus without complication ([place])   . Endometriosis   . Frequent PVCs    12,000/24 hours  . Hypertension   . Otalgia, unspecified   . Palpitations 10/04/2010  . Vascular bruit 01/2013   Right neck    Assessment: JASHAE WIGGS is a 65 y.o. female with past medical history of recent admission with discharge 4 days ago for acute encephalopathy where patient was intubated. Patient presents for acute onset of shortness of breath 2 days ago. CT shows saddle PE with right heart strain. Pharmacy has been consulted to start heparin for PE. Patient not on anticoagulation prior to admission. Hemoglobin and platelets wnl. Troponin elevated. SCr elevated to 1.27.   Goal of Therapy:  Heparin level 0.3-0.7 units/ml Monitor platelets by anticoagulation protocol: Yes   Plan:  Give 5000 units bolus x 1 Start heparin infusion at 1600 units/hr Check heparin level in 6-8 hours and daily while on heparin Continue to monitor H&H and platelets    Thank you for allowing [place] to participate in this patients care. Jens Som,  PharmD 04/15/2021 6:47 PM  Please check [email or web address] for unit-specific pharmacy phone numbers.

## 2021-04-15 NOTE — ED Notes (Signed)
Called lab to send phelb to bedside to draw labs

## 2021-04-15 NOTE — H&P (Signed)
TRH H&P    Patient Demographics:    Megan Oneal, is a 65 y.o. female  MRN: [id number]  DOB - [birth year]  Admit Date - 04/15/2021  Referring MD/NP/PA: Posey Pronto  Outpatient Primary MD for the patient is Sharilyn Sites, MD  Patient coming from: Home  Chief complaint- dyspnea   HPI:    Megan Oneal  is a 65 y.o. female, with history of psychiatric disorder, hypertension, diabetes mellitus without complication, and more presents the ED with chief complaint of dyspnea.  Patient reports that she had sudden onset of dyspnea on the 24th.  She had just been discharged from the hospital on the 22nd after 2-week stay.  She reports that the dyspnea has been worse since it started.  She reports no chest pain, no cough, no fever.  She has had palpitations.  She never had anything like this before.  Patient has no history of cancer, no long trips.  She is on Premarin.  Patient reports that her dyspnea is worse with exertion, she denies orthopnea.  She presented to her PCP today and was noted to be tachycardic and complaining of dyspnea so she was sent to the ED.  Patient has no other complaints at this time.  Her to hospitalization was for auditory hallucinations.  She reports that the problem has been resolved with her medications.  Patient does not smoke does not drink and does not use illicit drugs.  She reports that she has had 3 COVID shots.  She is also had her flu shot pneumonia shot.  She is full code.  In the ED Temp 97.7, heart rate 83-132, respiratory rate 24, blood pressure 116/69, satting 94% Heart rate at the time of my exam is 130 Negative respiratory panel CT PE shows PE with CT evidence of right heart strain with a ratio of 1.5 intermediate risk Intensivist was consulted and reports that they will see in the morning here at [place] troponin from 94-92 No leukocytosis with white blood cell count 9.5,  hemoglobin 12.7, platelet 298 Chemistry panel reveals a decreased bicarb at 20, bump in creatinine from 0.9-1.27 over 6 days and slight transaminitis with AST of 49 and ALT of 135 Patient was started on heparin drip Admission requested for PE    Review of systems:    In addition to the HPI above,  No Fever-chills, No Headache, No changes with Vision or hearing, No problems swallowing food or Liquids, No Chest pain, Cough admits to dyspnea No Abdominal pain, No Nausea or Vomiting, bowel movements are regular, No Blood in stool or Urine, No dysuria, No new skin rashes or bruises, No new joints pains-aches,  No new weakness, tingling, numbness in any extremity, No recent weight gain or loss, No polyuria, polydypsia or polyphagia,   All other systems reviewed and are negative.    Past History of the following :    Past Medical History:  Diagnosis Date  . Anxiety 03/12/2013  . Degenerative disc disease, lumbar    Epidural injections in 2007  . Diabetes mellitus  without complication ([place])   . Endometriosis   . Frequent PVCs    12,000/24 hours  . Hypertension   . Otalgia, unspecified   . Palpitations 10/04/2010  . Vascular bruit 01/2013   Right neck      Past Surgical History:  Procedure Laterality Date  . BREAST SURGERY     benign tumor excised-age 31  . CARDIOVASCULAR STRESS TEST  03/13/2008   No scintigraphic evidence of inducible myocardial ischemia, EKG negative for ischemia, no ECG changes.  . COLONOSCOPY  2009  . COLONOSCOPY N/A 09/21/2019   Procedure: COLONOSCOPY;  Surgeon: Rogene Houston, MD;  [facility];  Service: Endoscopy;  Laterality: N/A;  2:00-10:30  . CYST EXCISION  2007   Scalp  . DOPPLER ECHOCARDIOGRAPHY  03/13/2008   EF [id number], LV systolic function is normal, LV wall function is normal  . LAPAROSCOPIC CHOLECYSTECTOMY  2010  . LUMBAR LAMINECTOMY  2009    L4-L5 and L5-S1 laminectomy with diskectomy; transforaminal  . POLYPECTOMY  09/21/2019    Procedure: POLYPECTOMY;  Surgeon: Rogene Houston, MD;  Location: [facility];  Service: Endoscopy;;  . REPLACEMENT TOTAL KNEE    . SLEEP STUDY  10/29/2009   AHI-1.8/hr, during REM 6.4/hr; RDI-8.0/hr, during REM 15.0/hr; avg oxygen during REM and NREM was 94%  . TOTAL KNEE ARTHROPLASTY Bilateral   . TUBAL LIGATION        Social History:      Social History   Tobacco Use  . Smoking status: Former Smoker    Packs/day: 2.00    Years: 20.00    Pack years: 40.00    Types: Cigarettes    Start date: 08/03/1972    Quit date: 08/03/1996    Years since quitting: 24.7  . Smokeless tobacco: Former Systems developer    Quit date: 12/21/1994  Substance Use Topics  . Alcohol use: No       Family History :     Family History  Problem Relation Age of Onset  . Diabetes Mother   . Hypertension Mother   . Diabetes Brother   . Hypertension Brother   . Hypertension Father       Home Medications:   Prior to Admission medications   Medication Sig Start Date End Date Taking? Authorizing Provider  aspirin 81 MG tablet Take 81 mg by mouth daily.   Yes [provider]  cholecalciferol (VITAMIN D3) 25 MCG (1000 UT) tablet Take 1,000 Units by mouth daily.   Yes [provider]  dexlansoprazole (DEXILANT) 60 MG capsule Take 60 mg by mouth daily.   Yes [provider]  ferrous sulfate 325 (65 FE) MG tablet Take 325 mg by mouth daily with breakfast.    Yes [provider]  fluticasone (FLONASE) 50 MCG/ACT nasal spray Place 2 sprays into both nostrils daily as needed for allergies. 07/24/19  Yes [provider]  furosemide (LASIX) 40 MG tablet Take 40 mg by mouth every other day.    Yes [provider]  ibuprofen (ADVIL) 600 MG tablet Take 1 tablet (600 mg total) by mouth every 6 (six) hours as needed for moderate pain. 04/10/21  Yes Sreenath, Sudheer B, MD  ipratropium (ATROVENT) 0.03 % nasal spray Place 2 sprays into both nostrils 3 (three) times daily as  needed for rhinitis.   Yes [provider]  polyethylene glycol powder (GLYCOLAX/MIRALAX) powder 1 scoop daily or as needed 06/07/18  Yes Eure, Mertie Clause, MD  PREMARIN vaginal cream PLACE 1  APPLICATORFUL VAGINALLY DAILY. Patient taking differently: Place 1 Applicatorful vaginally daily. 12/30/20  Yes Florian Buff, MD  risperiDONE (RISPERDAL) 2 MG tablet Take 1 tablet (2 mg total) by mouth at bedtime. 04/07/21  Yes Nita Sells, MD  vitamin C (ASCORBIC ACID) 500 MG tablet Take 500 mg by mouth daily.   Yes [provider]  diltiazem (CARDIZEM CD) 240 MG 24 hr capsule Take 1 capsule (240 mg total) by mouth daily. Patient not taking: No sig reported 04/10/21 05/10/21  Ralene Muskrat B, MD  prednisoLONE acetate (PRED FORTE) 1 % ophthalmic suspension 1 drop 4 (four) times daily. Patient not taking: No sig reported    [provider]     Allergies:     Allergies  Allergen Reactions  . Metoprolol Other (See Comments)    Fatigue and malaise; wheezing  . Sulfa Antibiotics      Physical Exam:   Vitals  Blood pressure (!) 114/91, pulse (!) 130, temperature 97.7 F (36.5 C), temperature source Oral, resp. rate (!) 21, height 5\' 6"  (1.676 m), weight (!) 150.1 kg, SpO2 94 %.  1.  General: Patient lying supine in bed in no acute distress with head of bed elevated  2. Psychiatric: Flat affect, behavior normal for situation, alert, oriented x3  3. Neurologic: Face is symmetric, speech and language are normal, moves all 4 extremities voluntarily, oriented x3, no acute deficits on limited exam  4. HEENMT:  Head is atraumatic, normocephalic, pupils reactive to light, neck is supple, trachea is midline, mucous membranes are moist  5. Respiratory : Lungs are clear to auscultation bilaterally without wheezes, rhonchi, rales, no clubbing, no cyanosis  6. Cardiovascular : Heart rate is tachycardic, rhythm is regular, no murmurs rubs or gallops, no peripheral  edema  7. Gastrointestinal:  Abdomen is obese, soft, nondistended, nontender to palpation, bruises over the lower abdomen from heparin shots in her hospitalization, no palpable masses  8. Skin:  Skin is warm dry and intact without acute lesion on limited exam  9.Musculoskeletal:  No calf tenderness, peripheral pulses palpated, no acute deformities    Data Review:    CBC Recent Labs  Lab 04/09/21 0749 04/15/21 1635  WBC 9.1 9.5  HGB 11.4* 12.7  HCT 36.1 41.7  PLT 271 198  MCV 86.0 90.8  MCH 27.1 27.7  MCHC 31.6 30.5  RDW 15.6* 15.9*  LYMPHSABS  --  3.4  MONOABS  --  0.8  EOSABS  --  0.3  BASOSABS  --  0.1   ------------------------------------------------------------------------------------------------------------------  Results for orders placed or performed during the hospital encounter of 04/15/21 (from the past 48 hour(s))  Comprehensive metabolic panel     Status: Abnormal   Collection Time: 04/15/21  4:35 PM  Result Value Ref Range   Sodium 136 135 - 145 mmol/L   Potassium 4.3 3.5 - 5.1 mmol/L   Chloride 101 98 - 111 mmol/L   CO2 20 (L) 22 - 32 mmol/L   Glucose, Bld 121 (H) 70 - 99 mg/dL    Comment: Glucose reference range applies only to samples taken after fasting for at least 8 hours.   BUN 12 8 - 23 mg/dL   Creatinine, Ser 1.27 (H) 0.44 - 1.00 mg/dL   Calcium 9.1 8.9 - 10.3 mg/dL   Total Protein 7.2 6.5 - 8.1 g/dL   Albumin 3.6 3.5 - 5.0 g/dL   AST 49 (H) 15 - 41 U/L   ALT 135 (H) 0 - 44 U/L  Alkaline Phosphatase 64 38 - 126 U/L   Total Bilirubin 0.6 0.3 - 1.2 mg/dL   GFR, Estimated 48 (L) >60 mL/min    Comment: (NOTE) Calculated using the CKD-EPI Creatinine Equation (2021)    Anion gap 15 5 - 15    Comment: Performed at [facility], [address], [place], [place] [address]  CBC with Differential     Status: Abnormal   Collection Time: 04/15/21  4:35 PM  Result Value Ref Range   WBC 9.5 4.0 - 10.5 K/uL   RBC 4.59 3.87 - 5.11 MIL/uL    Hemoglobin 12.7 12.0 - 15.0 g/dL   HCT 41.7 36.0 - 46.0 %   MCV 90.8 80.0 - 100.0 fL   MCH 27.7 26.0 - 34.0 pg   MCHC 30.5 30.0 - 36.0 g/dL   RDW 15.9 (H) 11.5 - 15.5 %   Platelets 198 150 - 400 K/uL   nRBC 0.0 0.0 - 0.2 %   Neutrophils Relative % 51 %   Neutro Abs 4.8 1.7 - 7.7 K/uL   Lymphocytes Relative 35 %   Lymphs Abs 3.4 0.7 - 4.0 K/uL   Monocytes Relative 9 %   Monocytes Absolute 0.8 0.1 - 1.0 K/uL   Eosinophils Relative 3 %   Eosinophils Absolute 0.3 0.0 - 0.5 K/uL   Basophils Relative 1 %   Basophils Absolute 0.1 0.0 - 0.1 K/uL   Immature Granulocytes 1 %   Abs Immature Granulocytes 0.06 0.00 - 0.07 K/uL    Comment: Performed at [facility], [address], [place], [place] [address]  Troponin I (High Sensitivity)     Status: Abnormal   Collection Time: 04/15/21  4:35 PM  Result Value Ref Range   Troponin I (High Sensitivity) 94 (H) <18 ng/L    Comment: (NOTE) Elevated high sensitivity troponin I (hsTnI) values and significant  changes across serial measurements may suggest ACS but many other  chronic and acute conditions are known to elevate hsTnI results.  Refer to the "Links" section for chest pain algorithms and additional  guidance. Performed at [facility], [address], [place], [place] [address]   Heparin level (unfractionated)     Status: Abnormal   Collection Time: 04/15/21  4:35 PM  Result Value Ref Range   Heparin Unfractionated <0.10 (L) 0.30 - 0.70 IU/mL    Comment: (NOTE) The clinical reportable range upper limit is being lowered to >1.10 to align with the FDA approved guidance for the current laboratory assay.  If heparin results are below expected values, and patient dosage has  been confirmed, suggest follow up testing of antithrombin III levels. Performed at [facility], [address] [place], [place] [address]   Protime-INR     Status: Abnormal   Collection Time: 04/15/21  4:35 PM  Result Value Ref Range   Prothrombin Time 15.5 (H)  11.4 - 15.2 seconds   INR 1.2 0.8 - 1.2    Comment: (NOTE) INR goal varies based on device and disease states. Performed at [facility], [address], [place], [place] [address]   APTT     Status: None   Collection Time: 04/15/21  4:35 PM  Result Value Ref Range   aPTT 30 24 - 36 seconds    Comment: Performed at [facility], [address] [place], [place] [address]  Resp Panel by RT-PCR (Flu A&B, Covid) Nasopharyngeal Swab     Status: None   Collection Time: 04/15/21  5:04 PM   Specimen:  Nasopharyngeal Swab; Nasopharyngeal(NP) swabs in vial transport medium  Result Value Ref Range   SARS Coronavirus 2 by RT PCR NEGATIVE NEGATIVE    Comment: (NOTE) SARS-CoV-2 target nucleic acids are NOT DETECTED.  The SARS-CoV-2 RNA is generally detectable in upper respiratory specimens during the acute phase of infection. The lowest concentration of SARS-CoV-2 viral copies this assay can detect is 138 copies/mL. A negative result does not preclude SARS-Cov-2 infection and should not be used as the sole basis for treatment or other patient management decisions. A negative result may occur with  improper specimen collection/handling, submission of specimen other than nasopharyngeal swab, presence of viral mutation(s) within the areas targeted by this assay, and inadequate number of viral copies(<138 copies/mL). A negative result must be combined with clinical observations, patient history, and epidemiological information. The expected result is Negative.  Fact Sheet for Patients:  EntrepreneurPulse.com.au  Fact Sheet for Healthcare Providers:  IncredibleEmployment.be  This test is no t yet approved or cleared by the [place] FDA and  has been authorized for detection and/or diagnosis of SARS-CoV-2 by FDA under an Emergency Use Authorization (EUA). This EUA will remain  in effect (meaning this test can be used) for the duration of the COVID-19  declaration under Section 564(b)(1) of the Act, 21 U.S.C.section 360bbb-3(b)(1), unless the authorization is terminated  or revoked sooner.       Influenza A by PCR NEGATIVE NEGATIVE   Influenza B by PCR NEGATIVE NEGATIVE    Comment: (NOTE) The Xpert Xpress SARS-CoV-2/FLU/RSV plus assay is intended as an aid in the diagnosis of influenza from Nasopharyngeal swab specimens and should not be used as a sole basis for treatment. Nasal washings and aspirates are unacceptable for Xpert Xpress SARS-CoV-2/FLU/RSV testing.  Fact Sheet for Patients: EntrepreneurPulse.com.au  Fact Sheet for Healthcare Providers: IncredibleEmployment.be  This test is not yet approved or cleared by the [place] FDA and has been authorized for detection and/or diagnosis of SARS-CoV-2 by FDA under an Emergency Use Authorization (EUA). This EUA will remain in effect (meaning this test can be used) for the duration of the COVID-19 declaration under Section 564(b)(1) of the Act, 21 U.S.C. section 360bbb-3(b)(1), unless the authorization is terminated or revoked.  Performed at [facility], [address], [place], [place] [address]   Troponin I (High Sensitivity)     Status: Abnormal   Collection Time: 04/15/21  6:54 PM  Result Value Ref Range   Troponin I (High Sensitivity) 92 (H) <18 ng/L    Comment: (NOTE) Elevated high sensitivity troponin I (hsTnI) values and significant  changes across serial measurements may suggest ACS but many other  chronic and acute conditions are known to elevate hsTnI results.  Refer to the "Links" section for chest pain algorithms and additional  guidance. Performed at [facility], [address], [place], [place] [address]     Chemistries  Recent Labs  Lab 04/09/21 [phone] 04/15/21 1635  NA 137 136  K 4.5 4.3  CL 103 101  CO2 21* 20*  GLUCOSE 111* 121*  BUN 5* 12  CREATININE 0.94 1.27*  CALCIUM 8.9 9.1  AST  --  49*  ALT  --   135*  ALKPHOS  --  64  BILITOT  --  0.6   ------------------------------------------------------------------------------------------------------------------  ------------------------------------------------------------------------------------------------------------------ GFR: Estimated Creatinine Clearance: 68.4 mL/min (A) (by C-G formula based on SCr of 1.27 mg/dL (H)). Liver Function Tests: Recent Labs  Lab 04/15/21 1635  AST 49*  ALT 135*  ALKPHOS 64  BILITOT 0.6  PROT 7.2  ALBUMIN 3.6   No results for input(s): LIPASE, AMYLASE in the last 168 hours. No results for input(s): AMMONIA in the last 168 hours. Coagulation Profile: Recent Labs  Lab 04/15/21 1635  INR 1.2   Cardiac Enzymes: No results for input(s): CKTOTAL, CKMB, CKMBINDEX, TROPONINI in the last 168 hours. BNP (last 3 results) No results for input(s): PROBNP in the last 8760 hours. HbA1C: No results for input(s): HGBA1C in the last 72 hours. CBG: No results for input(s): GLUCAP in the last 168 hours. Lipid Profile: No results for input(s): CHOL, HDL, LDLCALC, TRIG, CHOLHDL, LDLDIRECT in the last 72 hours. Thyroid Function Tests: No results for input(s): TSH, T4TOTAL, FREET4, T3FREE, THYROIDAB in the last 72 hours. Anemia Panel: No results for input(s): VITAMINB12, FOLATE, FERRITIN, TIBC, IRON, RETICCTPCT in the last 72 hours.  --------------------------------------------------------------------------------------------------------------- Urine analysis:    Component Value Date/Time   COLORURINE COLORLESS (A) 03/26/2021 1726   APPEARANCEUR CLEAR (A) 03/26/2021 1726   APPEARANCEUR Clear 03/27/2014 0830   LABSPEC 1.002 (L) 03/26/2021 1726   LABSPEC 1.013 03/27/2014 0830   PHURINE 7.0 03/26/2021 1726   GLUCOSEU NEGATIVE 03/26/2021 1726   GLUCOSEU Negative 03/27/2014 0830   HGBUR SMALL (A) 03/26/2021 1726   BILIRUBINUR NEGATIVE 03/26/2021 1726   BILIRUBINUR Negative 03/27/2014 0830   KETONESUR 5 (A)  03/26/2021 1726   PROTEINUR NEGATIVE 03/26/2021 1726   NITRITE NEGATIVE 03/26/2021 1726   LEUKOCYTESUR NEGATIVE 03/26/2021 1726   LEUKOCYTESUR Trace 03/27/2014 0830      Imaging Results:    DG Chest 2 View  Result Date: 04/15/2021 CLINICAL DATA:  Shortness of breath. EXAM: CHEST - 2 VIEW COMPARISON:  03/28/2021 FINDINGS: Heart size is normal. No pleural effusion or edema identified. No airspace densities identified. Mild spondylosis in the thoracic spine. Review of the visualized osseous structures are unremarkable. IMPRESSION: No active cardiopulmonary abnormalities. Electronically Signed   By: Kerby Moors M.D.   On: 04/15/2021 16:23   CT Angio Chest PE W/Cm &/Or Wo Cm  Result Date: 04/15/2021 CLINICAL DATA:  PE suspected high probability, shortness of breath x2 days. EXAM: CT ANGIOGRAPHY CHEST WITH CONTRAST TECHNIQUE: Multidetector CT imaging of the chest was performed using the standard protocol during bolus administration of intravenous contrast. Multiplanar CT image reconstructions and MIPs were obtained to evaluate the vascular anatomy. CONTRAST:  162mL OMNIPAQUE IOHEXOL 350 MG/ML SOLN COMPARISON:  Chest radiograph from same day and March 28, 2021. FINDINGS: Cardiovascular: Satisfactory opacification of the pulmonary arteries to the segmental level. Saddle type pulmonary embolus at the takeoff of the bilateral main pulmonary artery branches with extension into the bilateral lobar and segmental pulmonary arteries. The right ventricular to left ventricular ratio is 1.5 with enlargement of the main pulmonary artery measuring 3.2 cm as well as reflux of contrast into the IVC and hepatic veins. Right-sided cardiac enlargement. No suspicious intracardiac filling defect visualized. No pericardial effusion. A few calcified aortic atherosclerotic plaques. No thoracic aortic aneurysm. Mediastinum/Nodes: Hypodense 8 mm nodule in the left lobe of the thyroid Not clinically significant; no follow-up  imaging recommended (ref: J Am Coll Radiol. 2015 Feb;12(2): 143-50).No pathologically enlarged mediastinal, hilar or axillary lymph nodes. Trachea esophagus are grossly unremarkable. Lungs/Pleura: Lungs are clear. No pleural effusion or pneumothorax. Upper Abdomen: No acute abnormality. Musculoskeletal: No chest wall abnormality. No acute or significant osseous findings. Review of the MIP images confirms the above findings. Critical Value/emergent results were called by telephone at the time of interpretation on 04/15/2021 at  6:28 pm to provider Dr Roderic Palau , who verbally acknowledged these results. IMPRESSION: 1. Sinal type pulmonary embolus with CT evidence of right heart strain (RV/LV Ratio 1.5) consistent with at least submassive (intermediate risk) PE. The presence of right heart strain has been associated with an increased risk of morbidity and mortality. 2. Aortic atherosclerosis.  Aortic Atherosclerosis (ICD10-I70.0). Critical Value/emergent results were called by telephone at the time of interpretation on 04/15/2021 at 6:28 pm to provider Dr Roderic Palau , who verbally acknowledged these results. Electronically Signed   By: Dahlia Bailiff MD   On: 04/15/2021 18:34    My personal review of EKG: Rhythm ST, Rate 126/min, QTc 451 ,no Acute ST changes   Assessment & Plan:    Principal Problem:   Pulmonary embolus (HCC) Active Problems:   Obesity   AKI (acute kidney injury) ([place])   Transaminitis   1. PE 1. As seen on CT 2. Heparin drip started 3. Intensivist to see in the a.m. 4. Echo and bilateral ultrasound DVT in the a.m. 5. Monitor on telemetry 2. AKI 1. Creatinine bumped from 0.94-1.27 over less than a week 2. Holding Lasix and PPI 3. Gentle hydration 4. Avoid nephrotoxic agents when possible 5. Monitor in the a.m. 3. Transaminitis 1. Gentle hydration 2. Monitor in a.m. 3. If trending up, consider right upper quadrant ultrasound 4. Hold hepatotoxic agents when possible 5. Continue to  monitor 4. Obesity 1. Advised on the importance of weight loss 5.    DVT Prophylaxis-   Heparin full dose AM Labs Ordered, also please review Full Orders  Family Communication: No family at bedside  Code Status: Full Admission status:Inpatient :The appropriate admission status for this patient is INPATIENT. Inpatient status is judged to be reasonable and necessary in order to provide the required intensity of service to ensure the patient's safety. The patient's presenting symptoms, physical exam findings, and initial radiographic and laboratory data in the context of their chronic comorbidities is felt to place them at high risk for further clinical deterioration. Furthermore, it is not anticipated that the patient will be medically stable for discharge from the hospital within 2 midnights of admission. The following factors support the admission status of inpatient.     The patient's presenting symptoms include dyspnea The worrisome physical exam findings include tachycardia The initial radiographic and laboratory data are worrisome because of PE on CT. The chronic co-morbidities include obesity, psychiatric disorder       * I certify that at the point of admission it is my clinical judgment that the patient will require inpatient hospital care spanning beyond 2 midnights from the point of admission due to high intensity of service, high risk for further deterioration and high frequency of surveillance required.*  Time spent in minutes :[place]

## 2021-04-15 NOTE — ED Notes (Signed)
Patient transported to X-ray 

## 2021-04-16 ENCOUNTER — Inpatient Hospital Stay (HOSPITAL_COMMUNITY): Payer: Medicare HMO

## 2021-04-16 DIAGNOSIS — I2692 Saddle embolus of pulmonary artery without acute cor pulmonale: Principal | ICD-10-CM

## 2021-04-16 LAB — MAGNESIUM: Magnesium: 1.8 mg/dL (ref 1.7–2.4)

## 2021-04-16 LAB — CBC WITH DIFFERENTIAL/PLATELET
Abs Immature Granulocytes: 0.04 10*3/uL (ref 0.00–0.07)
Basophils Absolute: 0.1 10*3/uL (ref 0.0–0.1)
Basophils Relative: 1 %
Eosinophils Absolute: 0.6 10*3/uL — ABNORMAL HIGH (ref 0.0–0.5)
Eosinophils Relative: 6 %
HCT: 37 % (ref 36.0–46.0)
Hemoglobin: 11.3 g/dL — ABNORMAL LOW (ref 12.0–15.0)
Immature Granulocytes: 0 %
Lymphocytes Relative: 45 %
Lymphs Abs: 4.3 10*3/uL — ABNORMAL HIGH (ref 0.7–4.0)
MCH: 27.6 pg (ref 26.0–34.0)
MCHC: 30.5 g/dL (ref 30.0–36.0)
MCV: 90.2 fL (ref 80.0–100.0)
Monocytes Absolute: 0.9 10*3/uL (ref 0.1–1.0)
Monocytes Relative: 9 %
Neutro Abs: 3.7 10*3/uL (ref 1.7–7.7)
Neutrophils Relative %: 39 %
Platelets: 208 10*3/uL (ref 150–400)
RBC: 4.1 MIL/uL (ref 3.87–5.11)
RDW: 16 % — ABNORMAL HIGH (ref 11.5–15.5)
WBC: 9.5 10*3/uL (ref 4.0–10.5)
nRBC: 0 % (ref 0.0–0.2)

## 2021-04-16 LAB — COMPREHENSIVE METABOLIC PANEL
ALT: 106 U/L — ABNORMAL HIGH (ref 0–44)
AST: 38 U/L (ref 15–41)
Albumin: 3.2 g/dL — ABNORMAL LOW (ref 3.5–5.0)
Alkaline Phosphatase: 57 U/L (ref 38–126)
Anion gap: 10 (ref 5–15)
BUN: 10 mg/dL (ref 8–23)
CO2: 22 mmol/L (ref 22–32)
Calcium: 8.7 mg/dL — ABNORMAL LOW (ref 8.9–10.3)
Chloride: 105 mmol/L (ref 98–111)
Creatinine, Ser: 1.09 mg/dL — ABNORMAL HIGH (ref 0.44–1.00)
GFR, Estimated: 57 mL/min — ABNORMAL LOW (ref >60)
Glucose, Bld: 118 mg/dL — ABNORMAL HIGH (ref 70–99)
Potassium: 4.1 mmol/L (ref 3.5–5.1)
Sodium: 137 mmol/L (ref 135–145)
Total Bilirubin: 0.8 mg/dL (ref 0.3–1.2)
Total Protein: 6.5 g/dL (ref 6.5–8.1)

## 2021-04-16 LAB — HEPARIN LEVEL (UNFRACTIONATED)
Heparin Unfractionated: 0.5 IU/mL (ref 0.30–0.70)
Heparin Unfractionated: 0.48 IU/mL (ref 0.30–0.70)

## 2021-04-16 MED ORDER — CHLORHEXIDINE GLUCONATE CLOTH 2 % EX PADS
6.0000 | MEDICATED_PAD | Freq: Every day | CUTANEOUS | Status: DC
  Administered 2021-04-16 – 2021-04-18 (×3): 6 via TOPICAL

## 2021-04-16 MED ORDER — DILTIAZEM HCL ER COATED BEADS 120 MG PO CP24
240.0000 mg | ORAL_CAPSULE | Freq: Every day | ORAL | Status: DC
  Administered 2021-04-17 – 2021-04-18 (×2): 240 mg via ORAL
  Filled 2021-04-16: qty 1
  Filled 2021-04-16: qty 2

## 2021-04-16 MED ORDER — DILTIAZEM HCL ER COATED BEADS 180 MG PO CP24
180.0000 mg | ORAL_CAPSULE | Freq: Every day | ORAL | Status: DC
  Administered 2021-04-16: 180 mg via ORAL
  Filled 2021-04-16: qty 1

## 2021-04-16 NOTE — Progress Notes (Signed)
[place] for heparin Indication: pulmonary embolus  Allergies  Allergen Reactions  . Metoprolol Other (See Comments)    Fatigue and malaise; wheezing  . Sulfa Antibiotics     Patient Measurements: Height: 5\' 6"  (167.6 cm) Weight: 133.8 kg (294 lb 15.6 oz) IBW/kg (Calculated) : 59.3 Heparin Dosing Weight: 96.9 kg  Vital Signs: Temp: 98.6 F (37 C) (04/27 1130) Temp Source: Oral (04/27 1130) BP: 119/79 (04/27 1200) Pulse Rate: 114 (04/27 1200)  Labs: Recent Labs    04/15/21 1635 04/15/21 1854 04/16/21 0209 04/16/21 0214 04/16/21 1235  HGB 12.7  --   --  11.3*  --   HCT 41.7  --   --  37.0  --   PLT 198  --   --  208  --   APTT 30  --   --   --   --   LABPROT 15.5*  --   --   --   --   INR 1.2  --   --   --   --   HEPARINUNFRC <0.10*  --  0.50  --  0.48  CREATININE 1.27*  --   --  1.09*  --   TROPONINIHS 94* 92*  --   --   --     Estimated Creatinine Clearance: 74.3 mL/min (A) (by C-G formula based on SCr of 1.09 mg/dL (H)).   Medical History: Past Medical History:  Diagnosis Date  . Anxiety 03/12/2013  . Degenerative disc disease, lumbar    Epidural injections in 2007  . Diabetes mellitus without complication ([place])   . Endometriosis   . Frequent PVCs    12,000/24 hours  . Hypertension   . Otalgia, unspecified   . Palpitations 10/04/2010  . Vascular bruit 01/2013   Right neck    Assessment: Megan Oneal is a 65 y.o. female with past medical history of recent admission with discharge 4 days ago for acute encephalopathy where patient was intubated. Patient presents for acute onset of shortness of breath 2 days ago. CT shows saddle PE with right heart strain. Pharmacy has been consulted to start heparin for PE. Patient not on anticoagulation prior to admission. Hemoglobin and platelets wnl. Troponin elevated. SCr elevated to 1.27.   4/27: HL 0.48, therapeutic   Goal of Therapy:  Heparin level 0.3-0.7  units/ml Monitor platelets by anticoagulation protocol: Yes   Plan:  Cont heparin at 1600 units/hr Daily HL    Megan Oneal Megan Oneal, PharmD, MBA, BCGP Clinical Pharmacist

## 2021-04-16 NOTE — Progress Notes (Signed)
[place] for heparin Indication: pulmonary embolus  Allergies  Allergen Reactions  . Metoprolol Other (See Comments)    Fatigue and malaise; wheezing  . Sulfa Antibiotics     Patient Measurements: Height: 5\' 6"  (167.6 cm) Weight: 133.8 kg (294 lb 15.6 oz) IBW/kg (Calculated) : 59.3 Heparin Dosing Weight: 96.9 kg  Vital Signs: Temp: 98.3 F (36.8 C) (04/27 0355) Temp Source: Oral (04/27 0355) BP: 140/98 (04/26 2200) Pulse Rate: 129 (04/26 2200)  Labs: Recent Labs    04/15/21 1635 04/15/21 1854 04/16/21 0209 04/16/21 0214  HGB 12.7  --   --  11.3*  HCT 41.7  --   --  37.0  PLT 198  --   --  208  APTT 30  --   --   --   LABPROT 15.5*  --   --   --   INR 1.2  --   --   --   HEPARINUNFRC <0.10*  --  0.50  --   CREATININE 1.27*  --   --  1.09*  TROPONINIHS 94* 92*  --   --     Estimated Creatinine Clearance: 74.3 mL/min (A) (by C-G formula based on SCr of 1.09 mg/dL (H)).   Medical History: Past Medical History:  Diagnosis Date  . Anxiety 03/12/2013  . Degenerative disc disease, lumbar    Epidural injections in 2007  . Diabetes mellitus without complication ([place])   . Endometriosis   . Frequent PVCs    12,000/24 hours  . Hypertension   . Otalgia, unspecified   . Palpitations 10/04/2010  . Vascular bruit 01/2013   Right neck    Assessment: Megan Oneal is a 65 y.o. female with past medical history of recent admission with discharge 4 days ago for acute encephalopathy where patient was intubated. Patient presents for acute onset of shortness of breath 2 days ago. CT shows saddle PE with right heart strain. Pharmacy has been consulted to start heparin for PE. Patient not on anticoagulation prior to admission. Hemoglobin and platelets wnl. Troponin elevated. SCr elevated to 1.27.  4/27 AM update:  Initial heparin level is therapeutic   Goal of Therapy:  Heparin level 0.3-0.7 units/ml Monitor platelets by anticoagulation  protocol: Yes   Plan:  Cont heparin at 1600 units/hr 1200 heparin level   Narda Bonds, PharmD, BCPS Clinical Pharmacist Phone: [phone]

## 2021-04-16 NOTE — Consult Note (Signed)
[facility]   Patient name: Megan Oneal Admit date: 04/15/2021  DOB: [birth year] LOS: 1  MRN: [id number] Consult date: 04/16/2021  Referring provider: Dr. Wynetta Emery, [facility]: Short of breath    History:  65 yo female former smoker presented to [facility] on 4/26 with 2 days of dyspnea.  She had recent hospitalization at [facility] (03/26/21 to 04/10/21) for altered mental status with compromised airway requiring intubation that was felt to be related to accidental flexeril overdose.  She was on lovenox for DVT prophylaxis during this hospitalization.  She has been on premarin.  She was found to have tachycardia at her PCP office.  She was advised to come to ER.  CT angio chest showed saddle PE with RV strain pattern, and doppler legs showed b/l lower leg DVTs.  She was started on heparin infusion.  She did not require supplemental oxygen.  Had mild elevation in troponin.  Past medical history:  HTN, DM type 2, CAD, PTSD, Morbid obesity with BMI 47.61, Endometriosis, Anxiety  Significant events:  4/26 Admit, start heparin gtt  Studies:   CT angio chest 4/26 >> saddle PE, RV:LV ratio 1.5, 8 mm Lt thyroid nodule  Doppler legs 4/27 >> occlusive DVT Rt calf veins through femoral vein into common femoral vein bifurcation; acute occlusive DVT Lt posterior tibial veins  Echo 4/27 >>   Micro:  COVID/Flu 4/26 >> negative  Lines:     Antibiotics:    Consults:      Interim history:  Denies chest pain, nausea, dizziness.  Breathing better.  Denies leg pains.  Vital signs:  BP 131/88   Pulse (!) 119   Temp 98.6 F (37 C) (Oral)   Resp (!) 22   Ht 5\' 6"  (1.676 m)   Wt 133.8 kg   SpO2 100%   BMI 47.61 kg/m   Intake/output:  I/O last 3 completed shifts: In: 1060.3 [I.V.:571.3; IV Piggyback:489] Out: -    Physical exam:   General - alert, sitting in chair, on room air Eyes - pupils reactive ENT - no sinus tenderness, no  stridor Cardiac - regular rate, mild tachycardia, no murmur Chest - equal breath sounds b/l, no wheezing or rales Abdomen - soft, non tender, + bowel sounds Extremities - ted hose on, 2+ Rt > Lt edema Skin - no rashes Neuro - normal strength, moves extremities, follows commands Psych - normal mood and behavior  Best practice:   DVT - heparin gtt SUP - not indicated Nutrition - carb modified, heart healthy Mobility - OOB to chair   Assessment/plan:   Acute saddle PE with bilateral lower leg DVT. - likely related to recent hospitalization with critical illness and premarin use for endometriosis - continue heparin gtt - f/u echo; deferred on 4/27 due to tachycardia - likely can transition to oral anticoagulation in next 24 to 48 hours - no indication for catheter directed or systemic thrombolytic therapy at this time  Sinus tachycardia in setting of acute PE. Mildly elevated troponin from demand ischemia in setting of PE. Hx of HTN. - cardizem per primary team  History of auditory hallucinations. - risperdal per primary team  Resolved hospital problems:    Goals of care/Family discussions:  Code status: full code  Labs:   CMP Latest Ref Rng & Units 04/16/2021 04/15/2021 04/09/2021  Glucose 70 - 99 mg/dL 118(H) 121(H) 111(H)  BUN 8 - 23 mg/dL 10 12 5(L)  Creatinine 0.44 - 1.00 mg/dL 1.09(H) 1.27(H) 0.94  Sodium 135 - 145 mmol/L 137 136 137  Potassium 3.5 - 5.1 mmol/L 4.1 4.3 4.5  Chloride 98 - 111 mmol/L 105 101 103  CO2 22 - 32 mmol/L 22 20(L) 21(L)  Calcium 8.9 - 10.3 mg/dL 8.7(L) 9.1 8.9  Total Protein 6.5 - 8.1 g/dL 6.5 7.2 -  Total Bilirubin 0.3 - 1.2 mg/dL 0.8 0.6 -  Alkaline Phos 38 - 126 U/L 57 64 -  AST 15 - 41 U/L 38 49(H) -  ALT 0 - 44 U/L 106(H) 135(H) -    CBC Latest Ref Rng & Units 04/16/2021 04/15/2021 04/09/2021  WBC 4.0 - 10.5 K/uL 9.5 9.5 9.1  Hemoglobin 12.0 - 15.0 g/dL 11.3(L) 12.7 11.4(L)  Hematocrit 36.0 - 46.0 % 37.0 41.7 36.1  Platelets 150 -  400 K/uL 208 198 271    Past surgical history:  She  has a past surgical history that includes Lumbar laminectomy (2009); Laparoscopic cholecystectomy (2010); Tubal ligation; Breast surgery; Cyst excision (2007); Colonoscopy (2009); Total knee arthroplasty (Bilateral); Replacement total knee; SLEEP STUDY (10/29/2009); doppler echocardiography (03/13/2008); Cardiovascular stress test (03/13/2008); Colonoscopy (N/A, 09/21/2019); and polypectomy (09/21/2019).  Social history:  She  reports that she quit smoking about 24 years ago. Her smoking use included cigarettes. She started smoking about 48 years ago. She has a 40.00 pack-year smoking history. She quit smokeless tobacco use about 26 years ago. She reports that she does not drink alcohol and does not use drugs.   Review of systems:  Reviewed and negative.  Family history:  Her family history includes Diabetes in her brother and mother; Hypertension in her brother, father, and mother.    Medications:   No current facility-administered medications on file prior to encounter.   Current Outpatient Medications on File Prior to Encounter  Medication Sig  . aspirin 81 MG tablet Take 81 mg by mouth daily.  . cholecalciferol (VITAMIN D3) 25 MCG (1000 UT) tablet Take 1,000 Units by mouth daily.  Marland Kitchen dexlansoprazole (DEXILANT) 60 MG capsule Take 60 mg by mouth daily.  . ferrous sulfate 325 (65 FE) MG tablet Take 325 mg by mouth daily with breakfast.   . fluticasone (FLONASE) 50 MCG/ACT nasal spray Place 2 sprays into both nostrils daily as needed for allergies.  . furosemide (LASIX) 40 MG tablet Take 40 mg by mouth every other day.   . ibuprofen (ADVIL) 600 MG tablet Take 1 tablet (600 mg total) by mouth every 6 (six) hours as needed for moderate pain.  Marland Kitchen ipratropium (ATROVENT) 0.03 % nasal spray Place 2 sprays into both nostrils 3 (three) times daily as needed for rhinitis.  . polyethylene glycol powder (GLYCOLAX/MIRALAX) powder 1 scoop daily or as  needed  . PREMARIN vaginal cream PLACE 1 APPLICATORFUL VAGINALLY DAILY. (Patient taking differently: Place 1 Applicatorful vaginally daily.)  . risperiDONE (RISPERDAL) 2 MG tablet Take 1 tablet (2 mg total) by mouth at bedtime.  . vitamin C (ASCORBIC ACID) 500 MG tablet Take 500 mg by mouth daily.  Marland Kitchen diltiazem (CARDIZEM CD) 240 MG 24 hr capsule Take 1 capsule (240 mg total) by mouth daily. (Patient not taking: No sig reported)  . prednisoLONE acetate (PRED FORTE) 1 % ophthalmic suspension 1 drop 4 (four) times daily. (Patient not taking: No sig reported)     Signature:   Chesley Mires, MD [place] Pager - [phone] 04/16/2021, 2:31 PM

## 2021-04-16 NOTE — Progress Notes (Incomplete)
2D echo attempted but HR too high. Will try again when HR is normalized.  Dustin Flock, RCS

## 2021-04-16 NOTE — Progress Notes (Signed)
PROGRESS NOTE   Megan Oneal  QBH:[id number] DOB: 04-Sep-1956 DOA: 04/15/2021 PCP: Sharilyn Sites, MD   Chief Complaint  Patient presents with  . Shortness of Breath   Level of care: Stepdown  Brief Admission History:  65 y.o. female, with history of psychiatric disorder, hypertension, diabetes mellitus without complication, and more presents the ED with chief complaint of dyspnea.  Patient reports that she had sudden onset of dyspnea on the 24th.  She had just been discharged from the hospital on the 22nd after 2-week stay.  She reports that the dyspnea has been worse since it started.  She reports no chest pain, no cough, no fever.  She has had palpitations.  She never had anything like this before.  Patient has no history of cancer, no long trips.  She is on Premarin.  Patient reports that her dyspnea is worse with exertion, she denies orthopnea.  She presented to her PCP today and was noted to be tachycardic and complaining of dyspnea so she was sent to the ED.  Pt was noted to have acute PE with right heart strain.  Pt started on IV heparin infusion and admitted.   Assessment & Plan:   Principal Problem:   Pulmonary embolus (HCC) Active Problems:   Obesity   AKI (acute kidney injury) ([place])   Transaminitis   Acute pulmonary embolus with right heart strain- these patients are associated with higher mortality, requested pulmonary consultation, continue IV heparin infusion for least 24 hours and plan to transition to [place] likely 04/17/2021.  Bleeding precautions.  Ultrasound of lower extremities reveal bilateral occlusive DVT.  TED hose ordered to try and prevent post thrombotic phlebitis.  Follow-up 2D echocardiogram.   Auditory hallucinations - recent behavioral health hospitalization, symptoms seem to be resolved now continue risperidone 2 mg as ordered by psychiatry team.   Tachycardia, chronic- she had been discharged recently on Cardizem CD to 40 mg daily however has not been taking  it at home and now having rebound tachycardia may have resumed Cardizem CD 180 mg daily we may have to titrate up to 240 if needed  DVT prophylaxis: IV heparin infusion Code Status: Full Family Communication: Patient updated with plan of care at bedside, verbalized understanding Disposition: Anticipate home Status is: Inpatient  Remains inpatient appropriate because:IV treatments appropriate due to intensity of illness or inability to take PO and Inpatient level of care appropriate due to severity of illness   Dispo: The patient is from: Home              Anticipated d/c is to: Home              Patient currently is not medically stable to d/c.   Difficult to place patient No   Consultants:   pulmonology  Procedures:     Antimicrobials:    Subjective: Pt reports that she is less SOB today.  She denies chest pain.  She has chronic leg swelling and painful right leg reported.    Objective: Vitals:   04/16/21 1044 04/16/21 1100 04/16/21 1130 04/16/21 1200  BP: 122/81   119/79  Pulse:  (!) 126 (!) 118 (!) 114  Resp:  (!) 23 19 (!) 22  Temp:   98.6 F (37 C)   TempSrc:   Oral   SpO2:  98% 98% 97%  Weight:      Height:        Intake/Output Summary (Last 24 hours) at 04/16/2021 1317 Last data filed at 04/16/2021  1200 Gross per 24 hour  Intake 2260.31 ml  Output 650 ml  Net 1610.31 ml   Filed Weights   04/15/21 1413 04/15/21 2253  Weight: (!) 150.1 kg 133.8 kg    Examination:  General exam: hard of hearing.  Appears calm and comfortable  Respiratory system: Clear to auscultation. Respiratory effort normal. Cardiovascular system: normal S1 & S2 heard. No JVD, murmurs, rubs, gallops or clicks. No pedal edema. Gastrointestinal system: Abdomen is nondistended, soft and nontender. No organomegaly or masses felt. Normal bowel sounds heard. Central nervous system: Alert and oriented. No focal neurological deficits. Extremities: no cords palpated but 1+ edema BLEs.   Pulses palpated BLEs.  Symmetric 5 x 5 power. Skin: No rashes, lesions or ulcers Psychiatry: Judgement and insight appear normal. Mood & affect flat.   Data Reviewed: I have personally reviewed following labs and imaging studies  CBC: Recent Labs  Lab 04/15/21 1635 04/16/21 0214  WBC 9.5 9.5  NEUTROABS 4.8 3.7  HGB 12.7 11.3*  HCT 41.7 37.0  MCV 90.8 90.2  PLT 198 462    Basic Metabolic Panel: Recent Labs  Lab 04/15/21 1635 04/16/21 0214  NA 136 137  K 4.3 4.1  CL 101 105  CO2 20* 22  GLUCOSE 121* 118*  BUN 12 10  CREATININE 1.27* 1.09*  CALCIUM 9.1 8.7*  MG  --  1.8    GFR: Estimated Creatinine Clearance: 74.3 mL/min (A) (by C-G formula based on SCr of 1.09 mg/dL (H)).  Liver Function Tests: Recent Labs  Lab 04/15/21 1635 04/16/21 0214  AST 49* 38  ALT 135* 106*  ALKPHOS 64 57  BILITOT 0.6 0.8  PROT 7.2 6.5  ALBUMIN 3.6 3.2*    CBG: No results for input(s): GLUCAP in the last 168 hours.  Recent Results (from the past 240 hour(s))  SARS CORONAVIRUS 2 (TAT 6-24 HRS) Nasopharyngeal Nasopharyngeal Swab     Status: None   Collection Time: 04/07/21  6:06 PM   Specimen: Nasopharyngeal Swab  Result Value Ref Range Status   SARS Coronavirus 2 NEGATIVE NEGATIVE Final    Comment: (NOTE) SARS-CoV-2 target nucleic acids are NOT DETECTED.  The SARS-CoV-2 RNA is generally detectable in upper and lower respiratory specimens during the acute phase of infection. Negative results do not preclude SARS-CoV-2 infection, do not rule out co-infections with other pathogens, and should not be used as the sole basis for treatment or other patient management decisions. Negative results must be combined with clinical observations, patient history, and epidemiological information. The expected result is Negative.  Fact Sheet for Patients: SugarRoll.be  Fact Sheet for Healthcare Providers: https://www.woods-mathews.com/  This  test is not yet approved or cleared by the [place] FDA and  has been authorized for detection and/or diagnosis of SARS-CoV-2 by FDA under an Emergency Use Authorization (EUA). This EUA will remain  in effect (meaning this test can be used) for the duration of the COVID-19 declaration under Se ction 564(b)(1) of the Act, 21 U.S.C. section 360bbb-3(b)(1), unless the authorization is terminated or revoked sooner.  Performed at [facility], [place] [address], [place], [place] [address]   Resp Panel by RT-PCR (Flu A&B, Covid) Nasopharyngeal Swab     Status: None   Collection Time: 04/15/21  5:04 PM   Specimen: Nasopharyngeal Swab; Nasopharyngeal(NP) swabs in vial transport medium  Result Value Ref Range Status   SARS Coronavirus 2 by RT PCR NEGATIVE NEGATIVE Final    Comment: (NOTE) SARS-CoV-2 target nucleic acids are  NOT DETECTED.  The SARS-CoV-2 RNA is generally detectable in upper respiratory specimens during the acute phase of infection. The lowest concentration of SARS-CoV-2 viral copies this assay can detect is 138 copies/mL. A negative result does not preclude SARS-Cov-2 infection and should not be used as the sole basis for treatment or other patient management decisions. A negative result may occur with  improper specimen collection/handling, submission of specimen other than nasopharyngeal swab, presence of viral mutation(s) within the areas targeted by this assay, and inadequate number of viral copies(<138 copies/mL). A negative result must be combined with clinical observations, patient history, and epidemiological information. The expected result is Negative.  Fact Sheet for Patients:  EntrepreneurPulse.com.au  Fact Sheet for Healthcare Providers:  IncredibleEmployment.be  This test is no t yet approved or cleared by the [place] FDA and  has been authorized for detection and/or diagnosis of SARS-CoV-2 by FDA under an  Emergency Use Authorization (EUA). This EUA will remain  in effect (meaning this test can be used) for the duration of the COVID-19 declaration under Section 564(b)(1) of the Act, 21 U.S.C.section 360bbb-3(b)(1), unless the authorization is terminated  or revoked sooner.       Influenza A by PCR NEGATIVE NEGATIVE Final   Influenza B by PCR NEGATIVE NEGATIVE Final    Comment: (NOTE) The Xpert Xpress SARS-CoV-2/FLU/RSV plus assay is intended as an aid in the diagnosis of influenza from Nasopharyngeal swab specimens and should not be used as a sole basis for treatment. Nasal washings and aspirates are unacceptable for Xpert Xpress SARS-CoV-2/FLU/RSV testing.  Fact Sheet for Patients: EntrepreneurPulse.com.au  Fact Sheet for Healthcare Providers: IncredibleEmployment.be  This test is not yet approved or cleared by the [place] FDA and has been authorized for detection and/or diagnosis of SARS-CoV-2 by FDA under an Emergency Use Authorization (EUA). This EUA will remain in effect (meaning this test can be used) for the duration of the COVID-19 declaration under Section 564(b)(1) of the Act, 21 U.S.C. section 360bbb-3(b)(1), unless the authorization is terminated or revoked.  Performed at [facility], [address], [place], [place] [address]      Radiology Studies: DG Chest 2 View  Result Date: 04/15/2021 CLINICAL DATA:  Shortness of breath. EXAM: CHEST - 2 VIEW COMPARISON:  03/28/2021 FINDINGS: Heart size is normal. No pleural effusion or edema identified. No airspace densities identified. Mild spondylosis in the thoracic spine. Review of the visualized osseous structures are unremarkable. IMPRESSION: No active cardiopulmonary abnormalities. Electronically Signed   By: Kerby Moors M.D.   On: 04/15/2021 16:23   CT Angio Chest PE W/Cm &/Or Wo Cm  Result Date: 04/15/2021 CLINICAL DATA:  PE suspected high probability, shortness of breath  x2 days. EXAM: CT ANGIOGRAPHY CHEST WITH CONTRAST TECHNIQUE: Multidetector CT imaging of the chest was performed using the standard protocol during bolus administration of intravenous contrast. Multiplanar CT image reconstructions and MIPs were obtained to evaluate the vascular anatomy. CONTRAST:  117mL OMNIPAQUE IOHEXOL 350 MG/ML SOLN COMPARISON:  Chest radiograph from same day and March 28, 2021. FINDINGS: Cardiovascular: Satisfactory opacification of the pulmonary arteries to the segmental level. Saddle type pulmonary embolus at the takeoff of the bilateral main pulmonary artery branches with extension into the bilateral lobar and segmental pulmonary arteries. The right ventricular to left ventricular ratio is 1.5 with enlargement of the main pulmonary artery measuring 3.2 cm as well as reflux of contrast into the IVC and hepatic veins. Right-sided cardiac enlargement. No suspicious intracardiac filling defect visualized. No pericardial  effusion. A few calcified aortic atherosclerotic plaques. No thoracic aortic aneurysm. Mediastinum/Nodes: Hypodense 8 mm nodule in the left lobe of the thyroid Not clinically significant; no follow-up imaging recommended (ref: J Am Coll Radiol. 2015 Feb;12(2): 143-50).No pathologically enlarged mediastinal, hilar or axillary lymph nodes. Trachea esophagus are grossly unremarkable. Lungs/Pleura: Lungs are clear. No pleural effusion or pneumothorax. Upper Abdomen: No acute abnormality. Musculoskeletal: No chest wall abnormality. No acute or significant osseous findings. Review of the MIP images confirms the above findings. Critical Value/emergent results were called by telephone at the time of interpretation on 04/15/2021 at 6:28 pm to provider Dr Roderic Palau , who verbally acknowledged these results. IMPRESSION: 1. Sinal type pulmonary embolus with CT evidence of right heart strain (RV/LV Ratio 1.5) consistent with at least submassive (intermediate risk) PE. The presence of right heart  strain has been associated with an increased risk of morbidity and mortality. 2. Aortic atherosclerosis.  Aortic Atherosclerosis (ICD10-I70.0). Critical Value/emergent results were called by telephone at the time of interpretation on 04/15/2021 at 6:28 pm to provider Dr Roderic Palau , who verbally acknowledged these results. Electronically Signed   By: Dahlia Bailiff MD   On: 04/15/2021 18:34   US Venous Img Lower Bilateral (DVT)  Result Date: 04/16/2021 CLINICAL DATA:  Pulmonary embolus identified yesterday. Assess for residual DVT. EXAM: BILATERAL LOWER EXTREMITY VENOUS DOPPLER ULTRASOUND TECHNIQUE: Gray-scale sonography with graded compression, as well as color Doppler and duplex ultrasound were performed to evaluate the lower extremity deep venous systems from the level of the common femoral vein and including the common femoral, femoral, profunda femoral, popliteal and calf veins including the posterior tibial, peroneal and gastrocnemius veins when visible. The superficial great saphenous vein was also interrogated. Spectral Doppler was utilized to evaluate flow at rest and with distal augmentation maneuvers in the common femoral, femoral and popliteal veins. COMPARISON:  None. FINDINGS: RIGHT LOWER EXTREMITY Common Femoral Vein: Patent and compressible in the proximal and mid segment. Thrombus does extend from the femoral vein just into the distal common femoral vein. Saphenofemoral Junction: No evidence of thrombus. Normal compressibility and flow on color Doppler imaging. Profunda Femoral Vein: Nonocclusive thrombus is present within the profunda femoral vein. Femoral Vein: The femoral vein is not compressible. The lumen is filled with low-level internal echoes. No evidence of color flow on color Doppler imaging. Findings are consistent with occlusive thrombus. The thrombus extends just into the distal common femoral vein. Popliteal Vein: Occlusive thrombus extends into the popliteal vein. Calf Veins: Occlusive  thrombus extends into the calf veins. Superficial Great Saphenous Vein: No evidence of thrombus. Normal compressibility. Venous Reflux:  None. Other Findings:  None. LEFT LOWER EXTREMITY Common Femoral Vein: No evidence of thrombus. Normal compressibility, respiratory phasicity and response to augmentation. Saphenofemoral Junction: No evidence of thrombus. Normal compressibility and flow on color Doppler imaging. Profunda Femoral Vein: No evidence of thrombus. Normal compressibility and flow on color Doppler imaging. Femoral Vein: No evidence of thrombus. Normal compressibility, respiratory phasicity and response to augmentation. Popliteal Vein: No evidence of thrombus. Normal compressibility, respiratory phasicity and response to augmentation. Calf Veins: The posterior tibial veins are expanded and contain low-level internal echoes. No evidence of color flow on color Doppler imaging. Findings are consistent with acute occlusive thrombus. Superficial Great Saphenous Vein: No evidence of thrombus. Normal compressibility. Venous Reflux:  None. Other Findings:  None. IMPRESSION: 1. Positive for extensive occlusive thrombus within the RIGHT lower extremity extending from the calf veins through the femoral vein and just into the  common femoral vein at the bifurcation. 2. Positive for isolated acute occlusive calf vein DVT in the LEFT posterior tibial veins. Electronically Signed   By: Jacqulynn Cadet M.D.   On: 04/16/2021 10:40    Scheduled Meds: . aspirin  81 mg Oral Daily  . Chlorhexidine Gluconate Cloth  6 each Topical Daily  . diltiazem  180 mg Oral Daily  . ferrous sulfate  325 mg Oral Q breakfast  . risperiDONE  2 mg Oral QHS   Continuous Infusions: . heparin 1,600 Units/hr (04/16/21 0755)     LOS: 1 day   Time spent: 20 mins  Shebra Muldrow Wynetta Emery, MD How to contact the [facility] Attending or Consulting provider [place] or covering provider during after hours [place], for this patient?  1. Check the care  team in [facility] and look for a) attending/consulting [facility] provider listed and b) the [facility] team listed 2. Log into [email or web address] and use [place]'s universal password to access. If you do not have the password, please contact the hospital operator. 3. Locate the [facility] provider you are looking for under [facility] and page to a number that you can be directly reached. 4. If you still have difficulty reaching the provider, please page the [facility] (Director on Call) for the Hospitalists listed on amion for assistance.  04/16/2021, 1:17 PM

## 2021-04-17 ENCOUNTER — Inpatient Hospital Stay (HOSPITAL_COMMUNITY): Payer: Medicare HMO

## 2021-04-17 DIAGNOSIS — I2609 Other pulmonary embolism with acute cor pulmonale: Secondary | ICD-10-CM

## 2021-04-17 LAB — CBC
HCT: 35.2 % — ABNORMAL LOW (ref 36.0–46.0)
Hemoglobin: 10.9 g/dL — ABNORMAL LOW (ref 12.0–15.0)
MCH: 28 pg (ref 26.0–34.0)
MCHC: 31 g/dL (ref 30.0–36.0)
MCV: 90.5 fL (ref 80.0–100.0)
Platelets: 244 10*3/uL (ref 150–400)
RBC: 3.89 MIL/uL (ref 3.87–5.11)
RDW: 16.6 % — ABNORMAL HIGH (ref 11.5–15.5)
WBC: 8.7 10*3/uL (ref 4.0–10.5)
nRBC: 0 % (ref 0.0–0.2)

## 2021-04-17 LAB — HEPARIN LEVEL (UNFRACTIONATED): Heparin Unfractionated: 0.31 IU/mL (ref 0.30–0.70)

## 2021-04-17 LAB — ECHOCARDIOGRAM COMPLETE
Height: 66 in
S' Lateral: 3.15 cm
Weight: 4726.66 oz

## 2021-04-17 LAB — BASIC METABOLIC PANEL
Anion gap: 9 (ref 5–15)
BUN: 7 mg/dL — ABNORMAL LOW (ref 8–23)
CO2: 24 mmol/L (ref 22–32)
Calcium: 8.5 mg/dL — ABNORMAL LOW (ref 8.9–10.3)
Chloride: 105 mmol/L (ref 98–111)
Creatinine, Ser: 0.99 mg/dL (ref 0.44–1.00)
GFR, Estimated: >60 mL/min (ref >60)
Glucose, Bld: 123 mg/dL — ABNORMAL HIGH (ref 70–99)
Potassium: 3.7 mmol/L (ref 3.5–5.1)
Sodium: 138 mmol/L (ref 135–145)

## 2021-04-17 LAB — MAGNESIUM: Magnesium: 1.7 mg/dL (ref 1.7–2.4)

## 2021-04-17 MED ORDER — APIXABAN 5 MG PO TABS
10.0000 mg | ORAL_TABLET | Freq: Two times a day (BID) | ORAL | Status: DC
  Administered 2021-04-17 – 2021-04-18 (×3): 10 mg via ORAL
  Filled 2021-04-17 (×3): qty 2

## 2021-04-17 MED ORDER — APIXABAN 5 MG PO TABS: 5.0000 mg | ORAL_TABLET | Freq: Two times a day (BID) | ORAL | Status: DC

## 2021-04-17 NOTE — Progress Notes (Signed)
PROGRESS NOTE   Megan Oneal  MWU:[id number] DOB: [birth year] DOA: 04/15/2021 PCP: Sharilyn Sites, MD   Chief Complaint  Patient presents with  . Shortness of Breath   Level of care: Telemetry  Brief Admission History:  65 y.o. female, with history of psychiatric disorder, hypertension, diabetes mellitus without complication, and more presents the ED with chief complaint of dyspnea.  Patient reports that she had sudden onset of dyspnea on the 24th.  She had just been discharged from the hospital on the 22nd after 2-week stay.  She reports that the dyspnea has been worse since it started.  She reports no chest pain, no cough, no fever.  She has had palpitations.  She never had anything like this before.  Patient has no history of cancer, no long trips.  She is on Premarin.  Patient reports that her dyspnea is worse with exertion, she denies orthopnea.  She presented to her PCP today and was noted to be tachycardic and complaining of dyspnea so she was sent to the ED.  Pt was noted to have acute PE with right heart strain.  Pt started on IV heparin infusion and admitted.   Assessment & Plan:   Principal Problem:   Pulmonary embolus (HCC) Active Problems:   Obesity   AKI (acute kidney injury) ([place])   Transaminitis   Acute pulmonary embolus with right heart strain/ Acute DVT Bilateral lower extremities- these patients are associated with higher mortality, requested pulmonary consultation, Pt was treated with IV heparin infusion for greater than 24 hours and now transitioned to apixaban per pharm D with plan to start 10 mg BID x 7 days, then 5 mg BID.  Bleeding precautions.  Ultrasound of lower extremities reveal bilateral occlusive DVT.  TED hose ordered to try and prevent post thrombotic phlebitis.    2D echocardiogram 4/28 1. Left ventricular ejection fraction, by estimation, is 50 to 55%. The left ventricle has low normal function. The left ventricle has no regional  wall motion  abnormalities. There is mild left ventricular hypertrophy. Left ventricular diastolic parameters are indeterminate. There is the interventricular septum is flattened in diastole ('D' shaped left ventricle), consistent with right ventricular volume overload.  2. Right ventricular systolic function is moderately reduced. The right ventricular size is moderately enlarged. There is upper normal pulmonary  artery systolic pressure. The estimated right ventricular systolic pressure is 72.5 mmHg.  3. Right atrial size was mild to moderately dilated.  4. A small pericardial effusion is present. The pericardial effusion is posterior to the left ventricle.  5. The mitral valve is grossly normal. Trivial mitral valve regurgitation.  6. The aortic valve is tricuspid. Aortic valve regurgitation is not visualized.  7. The inferior vena cava is dilated in size with >50% respiratory variability, suggesting right atrial pressure of 8 mmHg.  Auditory hallucinations - recent behavioral health hospitalization, symptoms seem to be resolved now continue risperidone 2 mg as ordered by psychiatry team.  Tachycardia, chronic- she had been discharged recently on Cardizem CD to 240 mg daily however has not been taking it at home and now having rebound tachycardia may have resumed Cardizem CD 240 mg daily.  HR is better controlled, transfer to telemetry today.  Requested PT evaluation.    Normocytic anemia - no bleeding has been reported. Follow Hg, continue iron supplement.   DVT prophylaxis: Apixaban Code Status: Full Family Communication: sister updated telephone 4/28 Disposition: Anticipate home with [facility] Status is: Inpatient  Remains inpatient appropriate because:IV treatments appropriate  due to intensity of illness or inability to take PO and Inpatient level of care appropriate due to severity of illness   Dispo: The patient is from: Home              Anticipated d/c is to: Home              Patient  currently is not medically stable to d/c.   Difficult to place patient No   Consultants:   pulmonology  Procedures:     Antimicrobials:    Subjective: Pt reports that she is less SOB today.  She denies chest pain.  She has chronic leg swelling and painful right leg reported.    Objective: Vitals:   04/17/21 0900 04/17/21 1000 04/17/21 1100 04/17/21 1200  BP:    128/79  Pulse:  (!) 109 (!) 111 (!) 102  Resp: (!) 23 18 (!) 24 19  Temp:      TempSrc:      SpO2:  96% 98% 97%  Weight:      Height:        Intake/Output Summary (Last 24 hours) at 04/17/2021 1330 Last data filed at 04/17/2021 0749 Gross per 24 hour  Intake 763.96 ml  Output 625 ml  Net 138.96 ml   Filed Weights   04/15/21 1413 04/15/21 2253 04/17/21 0600  Weight: (!) 150.1 kg 133.8 kg 134 kg    Examination:  General exam: hard of hearing.  Appears calm and comfortable  Respiratory system: Clear to auscultation. Respiratory effort normal. Cardiovascular system: normal S1 & S2 heard. No JVD, murmurs, rubs, gallops or clicks. No pedal edema. Gastrointestinal system: Abdomen is nondistended, soft and nontender. No organomegaly or masses felt. Normal bowel sounds heard. Central nervous system: Alert and oriented. No focal neurological deficits. Extremities: no cords palpated but 1+ edema BLEs.  Pulses palpated BLEs.  Symmetric 5 x 5 power. Skin: No rashes, lesions or ulcers Psychiatry: Judgement and insight appear normal. Mood & affect flat.   Data Reviewed: I have personally reviewed following labs and imaging studies  CBC: Recent Labs  Lab 04/15/21 1635 04/16/21 0214 04/17/21 0526  WBC 9.5 9.5 8.7  NEUTROABS 4.8 3.7  --   HGB 12.7 11.3* 10.9*  HCT 41.7 37.0 35.2*  MCV 90.8 90.2 90.5  PLT 198 208 244    Basic Metabolic Panel: Recent Labs  Lab 04/15/21 1635 04/16/21 0214 04/17/21 0526  NA 136 137 138  K 4.3 4.1 3.7  CL 101 105 105  CO2 20* 22 24  GLUCOSE 121* 118* 123*  BUN 12 10 7*   CREATININE 1.27* 1.09* 0.99  CALCIUM 9.1 8.7* 8.5*  MG  --  1.8 1.7    GFR: Estimated Creatinine Clearance: 81.9 mL/min (by C-G formula based on SCr of 0.99 mg/dL).  Liver Function Tests: Recent Labs  Lab 04/15/21 1635 04/16/21 0214  AST 49* 38  ALT 135* 106*  ALKPHOS 64 57  BILITOT 0.6 0.8  PROT 7.2 6.5  ALBUMIN 3.6 3.2*    CBG: No results for input(s): GLUCAP in the last 168 hours.  Recent Results (from the past 240 hour(s))  SARS CORONAVIRUS 2 (TAT 6-24 HRS) Nasopharyngeal Nasopharyngeal Swab     Status: None   Collection Time: 04/07/21  6:06 PM   Specimen: Nasopharyngeal Swab  Result Value Ref Range Status   SARS Coronavirus 2 NEGATIVE NEGATIVE Final    Comment: (NOTE) SARS-CoV-2 target nucleic acids are NOT DETECTED.  The SARS-CoV-2 RNA is generally detectable  in upper and lower respiratory specimens during the acute phase of infection. Negative results do not preclude SARS-CoV-2 infection, do not rule out co-infections with other pathogens, and should not be used as the sole basis for treatment or other patient management decisions. Negative results must be combined with clinical observations, patient history, and epidemiological information. The expected result is Negative.  Fact Sheet for Patients: SugarRoll.be  Fact Sheet for Healthcare Providers: https://www.woods-mathews.com/  This test is not yet approved or cleared by the [place] FDA and  has been authorized for detection and/or diagnosis of SARS-CoV-2 by FDA under an Emergency Use Authorization (EUA). This EUA will remain  in effect (meaning this test can be used) for the duration of the COVID-19 declaration under Se ction 564(b)(1) of the Act, 21 U.S.C. section 360bbb-3(b)(1), unless the authorization is terminated or revoked sooner.  Performed at [facility], [place] [address], [place], [place] [address]   Resp Panel by RT-PCR (Flu A&B,  Covid) Nasopharyngeal Swab     Status: None   Collection Time: 04/15/21  5:04 PM   Specimen: Nasopharyngeal Swab; Nasopharyngeal(NP) swabs in vial transport medium  Result Value Ref Range Status   SARS Coronavirus 2 by RT PCR NEGATIVE NEGATIVE Final    Comment: (NOTE) SARS-CoV-2 target nucleic acids are NOT DETECTED.  The SARS-CoV-2 RNA is generally detectable in upper respiratory specimens during the acute phase of infection. The lowest concentration of SARS-CoV-2 viral copies this assay can detect is 138 copies/mL. A negative result does not preclude SARS-Cov-2 infection and should not be used as the sole basis for treatment or other patient management decisions. A negative result may occur with  improper specimen collection/handling, submission of specimen other than nasopharyngeal swab, presence of viral mutation(s) within the areas targeted by this assay, and inadequate number of viral copies(<138 copies/mL). A negative result must be combined with clinical observations, patient history, and epidemiological information. The expected result is Negative.  Fact Sheet for Patients:  EntrepreneurPulse.com.au  Fact Sheet for Healthcare Providers:  IncredibleEmployment.be  This test is no t yet approved or cleared by the [place] FDA and  has been authorized for detection and/or diagnosis of SARS-CoV-2 by FDA under an Emergency Use Authorization (EUA). This EUA will remain  in effect (meaning this test can be used) for the duration of the COVID-19 declaration under Section 564(b)(1) of the Act, 21 U.S.C.section 360bbb-3(b)(1), unless the authorization is terminated  or revoked sooner.       Influenza A by PCR NEGATIVE NEGATIVE Final   Influenza B by PCR NEGATIVE NEGATIVE Final    Comment: (NOTE) The Xpert Xpress SARS-CoV-2/FLU/RSV plus assay is intended as an aid in the diagnosis of influenza from Nasopharyngeal swab specimens and should  not be used as a sole basis for treatment. Nasal washings and aspirates are unacceptable for Xpert Xpress SARS-CoV-2/FLU/RSV testing.  Fact Sheet for Patients: EntrepreneurPulse.com.au  Fact Sheet for Healthcare Providers: IncredibleEmployment.be  This test is not yet approved or cleared by the [place] FDA and has been authorized for detection and/or diagnosis of SARS-CoV-2 by FDA under an Emergency Use Authorization (EUA). This EUA will remain in effect (meaning this test can be used) for the duration of the COVID-19 declaration under Section 564(b)(1) of the Act, 21 U.S.C. section 360bbb-3(b)(1), unless the authorization is terminated or revoked.  Performed at [facility], [address], [place], [place] [address]      Radiology Studies: DG Chest 2 View  Result Date: 04/15/2021  CLINICAL DATA:  Shortness of breath. EXAM: CHEST - 2 VIEW COMPARISON:  03/28/2021 FINDINGS: Heart size is normal. No pleural effusion or edema identified. No airspace densities identified. Mild spondylosis in the thoracic spine. Review of the visualized osseous structures are unremarkable. IMPRESSION: No active cardiopulmonary abnormalities. Electronically Signed   By: Kerby Moors M.D.   On: 04/15/2021 16:23   CT Angio Chest PE W/Cm &/Or Wo Cm  Result Date: 04/15/2021 CLINICAL DATA:  PE suspected high probability, shortness of breath x2 days. EXAM: CT ANGIOGRAPHY CHEST WITH CONTRAST TECHNIQUE: Multidetector CT imaging of the chest was performed using the standard protocol during bolus administration of intravenous contrast. Multiplanar CT image reconstructions and MIPs were obtained to evaluate the vascular anatomy. CONTRAST:  119mL OMNIPAQUE IOHEXOL 350 MG/ML SOLN COMPARISON:  Chest radiograph from same day and March 28, 2021. FINDINGS: Cardiovascular: Satisfactory opacification of the pulmonary arteries to the segmental level. Saddle type pulmonary embolus at the  takeoff of the bilateral main pulmonary artery branches with extension into the bilateral lobar and segmental pulmonary arteries. The right ventricular to left ventricular ratio is 1.5 with enlargement of the main pulmonary artery measuring 3.2 cm as well as reflux of contrast into the IVC and hepatic veins. Right-sided cardiac enlargement. No suspicious intracardiac filling defect visualized. No pericardial effusion. A few calcified aortic atherosclerotic plaques. No thoracic aortic aneurysm. Mediastinum/Nodes: Hypodense 8 mm nodule in the left lobe of the thyroid Not clinically significant; no follow-up imaging recommended (ref: J Am Coll Radiol. 2015 Feb;12(2): 143-50).No pathologically enlarged mediastinal, hilar or axillary lymph nodes. Trachea esophagus are grossly unremarkable. Lungs/Pleura: Lungs are clear. No pleural effusion or pneumothorax. Upper Abdomen: No acute abnormality. Musculoskeletal: No chest wall abnormality. No acute or significant osseous findings. Review of the MIP images confirms the above findings. Critical Value/emergent results were called by telephone at the time of interpretation on 04/15/2021 at 6:28 pm to provider Dr Roderic Palau , who verbally acknowledged these results. IMPRESSION: 1. Sinal type pulmonary embolus with CT evidence of right heart strain (RV/LV Ratio 1.5) consistent with at least submassive (intermediate risk) PE. The presence of right heart strain has been associated with an increased risk of morbidity and mortality. 2. Aortic atherosclerosis.  Aortic Atherosclerosis (ICD10-I70.0). Critical Value/emergent results were called by telephone at the time of interpretation on 04/15/2021 at 6:28 pm to provider Dr Roderic Palau , who verbally acknowledged these results. Electronically Signed   By: Dahlia Bailiff MD   On: 04/15/2021 18:34   US Venous Img Lower Bilateral (DVT)  Result Date: 04/16/2021 CLINICAL DATA:  Pulmonary embolus identified yesterday. Assess for residual DVT. EXAM:  BILATERAL LOWER EXTREMITY VENOUS DOPPLER ULTRASOUND TECHNIQUE: Gray-scale sonography with graded compression, as well as color Doppler and duplex ultrasound were performed to evaluate the lower extremity deep venous systems from the level of the common femoral vein and including the common femoral, femoral, profunda femoral, popliteal and calf veins including the posterior tibial, peroneal and gastrocnemius veins when visible. The superficial great saphenous vein was also interrogated. Spectral Doppler was utilized to evaluate flow at rest and with distal augmentation maneuvers in the common femoral, femoral and popliteal veins. COMPARISON:  None. FINDINGS: RIGHT LOWER EXTREMITY Common Femoral Vein: Patent and compressible in the proximal and mid segment. Thrombus does extend from the femoral vein just into the distal common femoral vein. Saphenofemoral Junction: No evidence of thrombus. Normal compressibility and flow on color Doppler imaging. Profunda Femoral Vein: Nonocclusive thrombus is present within the profunda femoral vein. Femoral  Vein: The femoral vein is not compressible. The lumen is filled with low-level internal echoes. No evidence of color flow on color Doppler imaging. Findings are consistent with occlusive thrombus. The thrombus extends just into the distal common femoral vein. Popliteal Vein: Occlusive thrombus extends into the popliteal vein. Calf Veins: Occlusive thrombus extends into the calf veins. Superficial Great Saphenous Vein: No evidence of thrombus. Normal compressibility. Venous Reflux:  None. Other Findings:  None. LEFT LOWER EXTREMITY Common Femoral Vein: No evidence of thrombus. Normal compressibility, respiratory phasicity and response to augmentation. Saphenofemoral Junction: No evidence of thrombus. Normal compressibility and flow on color Doppler imaging. Profunda Femoral Vein: No evidence of thrombus. Normal compressibility and flow on color Doppler imaging. Femoral Vein: No  evidence of thrombus. Normal compressibility, respiratory phasicity and response to augmentation. Popliteal Vein: No evidence of thrombus. Normal compressibility, respiratory phasicity and response to augmentation. Calf Veins: The posterior tibial veins are expanded and contain low-level internal echoes. No evidence of color flow on color Doppler imaging. Findings are consistent with acute occlusive thrombus. Superficial Great Saphenous Vein: No evidence of thrombus. Normal compressibility. Venous Reflux:  None. Other Findings:  None. IMPRESSION: 1. Positive for extensive occlusive thrombus within the RIGHT lower extremity extending from the calf veins through the femoral vein and just into the common femoral vein at the bifurcation. 2. Positive for isolated acute occlusive calf vein DVT in the LEFT posterior tibial veins. Electronically Signed   By: Jacqulynn Cadet M.D.   On: 04/16/2021 10:40   ECHOCARDIOGRAM COMPLETE  Result Date: 04/17/2021    ECHOCARDIOGRAM REPORT   Patient Name:   TANEQUA SCHERSCHEL Date of Exam: 04/17/2021 Medical Rec #:  [id number]    Height:       66.0 in Accession #:    [id number]   Weight:       295.4 lb Date of Birth:  12/17/1956    BSA:          2.360 m Patient Age:    64 years     BP:           123/70 mmHg Patient Gender: F            HR:           111 bpm. Exam Location:  [facility] Procedure: 2D Echo Indications:    Pulmonary Embolus I26.09  History:        Patient has prior history of Echocardiogram examinations, most                 recent 11/28/2020. Arrythmias:PVC; Risk Factors:Hypertension and                 Diabetes.  Sonographer:    Mikki Santee RDCS (AE) Referring Phys: [id number] ASIA B [place]  1. Left ventricular ejection fraction, by estimation, is 50 to 55%. The left ventricle has low normal function. The left ventricle has no regional wall motion abnormalities. There is mild left ventricular hypertrophy. Left ventricular diastolic parameters are  indeterminate. There is the interventricular septum is flattened in diastole ('D' shaped left ventricle), consistent with right ventricular volume overload.  2. Right ventricular systolic function is moderately reduced. The right ventricular size is moderately enlarged. There is upper normal pulmonary artery systolic pressure. The estimated right ventricular systolic pressure is [id number] mmHg.  3. Right atrial size was mild to moderately dilated.  4. A small pericardial effusion is present. The pericardial effusion is posterior to the left ventricle.  5. The  mitral valve is grossly normal. Trivial mitral valve regurgitation.  6. The aortic valve is tricuspid. Aortic valve regurgitation is not visualized.  7. The inferior vena cava is dilated in size with >50% respiratory variability, suggesting right atrial pressure of 8 mmHg. FINDINGS  Left Ventricle: Left ventricular ejection fraction, by estimation, is 50 to 55%. The left ventricle has low normal function. The left ventricle has no regional wall motion abnormalities. The left ventricular internal cavity size was normal in size. There is mild left ventricular hypertrophy. The interventricular septum is flattened in diastole ('D' shaped left ventricle), consistent with right ventricular volume overload. Left ventricular diastolic parameters are indeterminate. Right Ventricle: The right ventricular size is moderately enlarged. No increase in right ventricular wall thickness. Right ventricular systolic function is moderately reduced. There is normal pulmonary artery systolic pressure. The tricuspid regurgitant velocity is 2.62 m/s, and with an assumed right atrial pressure of 8 mmHg, the estimated right ventricular systolic pressure is Q000111Q mmHg. Left Atrium: Left atrial size was normal in size. Right Atrium: Right atrial size was mild to moderately dilated. Pericardium: A small pericardial effusion is present. The pericardial effusion is posterior to the left ventricle.  Mitral Valve: The mitral valve is grossly normal. Trivial mitral valve regurgitation. Tricuspid Valve: The tricuspid valve is grossly normal. Tricuspid valve regurgitation is mild. Aortic Valve: The aortic valve is tricuspid. Aortic valve regurgitation is not visualized. Pulmonic Valve: The pulmonic valve was grossly normal. Pulmonic valve regurgitation is mild. Aorta: The aortic root is normal in size and structure. Venous: The inferior vena cava is dilated in size with greater than 50% respiratory variability, suggesting right atrial pressure of 8 mmHg. IAS/Shunts: No atrial level shunt detected by color flow Doppler.  LEFT VENTRICLE PLAX 2D LVIDd:         4.32 cm LVIDs:         3.15 cm LV PW:         1.17 cm LV IVS:        1.10 cm LVOT diam:     2.10 cm LV SV:         63 LV SV Index:   27 LVOT Area:     3.46 cm  RIGHT VENTRICLE RV S prime:     10.40 cm/s LEFT ATRIUM             Index       RIGHT ATRIUM           Index LA diam:        3.40 cm 1.44 cm/m  RA Area:     28.00 cm LA Vol (A2C):   41.4 ml 17.54 ml/m RA Volume:   111.00 ml 47.03 ml/m LA Vol (A4C):   46.1 ml 19.53 ml/m LA Biplane Vol: 44.5 ml 18.85 ml/m  AORTIC VALVE LVOT Vmax:   113.00 cm/s LVOT Vmean:  67.800 cm/s LVOT VTI:    0.183 m  AORTA Ao Root diam: 3.20 cm TRICUSPID VALVE TR Peak grad:   27.5 mmHg TR Vmax:        262.00 cm/s  SHUNTS Systemic VTI:  0.18 m Systemic Diam: 2.10 cm Rozann Lesches MD Electronically signed by Rozann Lesches MD Signature Date/Time: 04/17/2021/11:54:58 AM    Final     Scheduled Meds: . apixaban  10 mg Oral BID   Followed by  . [START ON 04/24/2021] apixaban  5 mg Oral BID  . aspirin  81 mg Oral Daily  . Chlorhexidine Gluconate Cloth  6  each Topical Daily  . diltiazem  240 mg Oral Daily  . ferrous sulfate  325 mg Oral Q breakfast  . risperiDONE  2 mg Oral QHS   Continuous Infusions:   LOS: 2 days   Time spent: 35 mins  Haylin Camilli Wynetta Emery, MD How to contact the [facility] Attending or Consulting provider [place] or covering provider during after hours [place], for this patient?  1. Check the care team in [facility] and look for a) attending/consulting [facility] provider listed and b) the [facility] team listed 2. Log into [email or web address] and use [place]'s universal password to access. If you do not have the password, please contact the hospital operator. 3. Locate the [facility] provider you are looking for under [facility] and page to a number that you can be directly reached. 4. If you still have difficulty reaching the provider, please page the [facility] (Director on Call) for the Hospitalists listed on amion for assistance.  04/17/2021, 1:30 PM

## 2021-04-17 NOTE — Progress Notes (Deleted)
Cardiology Office Note  Date: 04/17/2021   ID: Megan, Oneal December 03, 1956, MRN [id number]  PCP:  Megan Sites, MD  Cardiologist:  Megan Dolly, MD Electrophysiologist:  None   Chief Complaint: Hospital follow up PE / DVT  History of Present Illness: Megan Oneal is a 65 y.o. female with a history of HTN, DM 2, CAD, PTSD, morbid obesity, endometriosis, anxiety., previous smoker, on premarin.   Pt had ecent hospitalization at [facility] on 03/26/2021 to 04/10/2021 for altered mental status (encephalopathy) with compromised airway requiring intubation was felt related to accidental Flexeril overdose.  She was on Lovenox for DVT prophylaxis during hospitalization.  She had been on Premarin.    She was found to be tachycardic at her PCP office.  She was advised to come to the emergency room. She had developed SOB over the prior 2 days with tachycardia. CTA of chest showed saddle PE with RV strain pattern and Doppler of the legs demonstrated bilateral lower leg DVTs.   She was admitted on 04/15/2021. She was started on heparin infusion.  Past Medical History:  Diagnosis Date  . Anxiety 03/12/2013  . Degenerative disc disease, lumbar    Epidural injections in 2007  . Diabetes mellitus without complication ([place])   . Endometriosis   . Frequent PVCs    12,000/24 hours  . Hypertension   . Otalgia, unspecified   . Palpitations 10/04/2010  . Vascular bruit 01/2013   Right neck    Past Surgical History:  Procedure Laterality Date  . BREAST SURGERY     benign tumor excised-age 74  . CARDIOVASCULAR STRESS TEST  03/13/2008   No scintigraphic evidence of inducible myocardial ischemia, EKG negative for ischemia, no ECG changes.  . COLONOSCOPY  2009  . COLONOSCOPY N/A 09/21/2019   Procedure: COLONOSCOPY;  Surgeon: Megan Houston, MD;  Location: [facility];  Service: Endoscopy;  Laterality: N/A;  2:00-10:30  . CYST EXCISION  2007   Scalp  . DOPPLER  ECHOCARDIOGRAPHY  03/13/2008   EF >09%, LV systolic function is normal, LV wall function is normal  . LAPAROSCOPIC CHOLECYSTECTOMY  2010  . LUMBAR LAMINECTOMY  2009    L4-L5 and L5-S1 laminectomy with diskectomy; transforaminal  . POLYPECTOMY  09/21/2019   Procedure: POLYPECTOMY;  Surgeon: Megan Houston, MD;  Location: [facility];  Service: Endoscopy;;  . REPLACEMENT TOTAL KNEE    . SLEEP STUDY  10/29/2009   AHI-1.8/hr, during REM 6.4/hr; RDI-8.0/hr, during REM 15.0/hr; avg oxygen during REM and NREM was 94%  . TOTAL KNEE ARTHROPLASTY Bilateral   . TUBAL LIGATION      No current facility-administered medications for this visit.   No current outpatient medications on file.   Facility-Administered Medications Ordered in Other Visits  Medication Dose Route Frequency Provider Last Rate Last Admin  . acetaminophen (TYLENOL) tablet 650 mg  650 mg Oral Q6H PRN Zierle-Ghosh, Asia B, DO       Or  . acetaminophen (TYLENOL) suppository 650 mg  650 mg Rectal Q6H PRN Zierle-Ghosh, Asia B, DO      . apixaban (ELIQUIS) tablet 10 mg  10 mg Oral BID Johnson, Clanford L, MD   10 mg at 04/17/21 2203   Followed by  . [START ON 04/24/2021] apixaban (ELIQUIS) tablet 5 mg  5 mg Oral BID Johnson, Clanford L, MD      . aspirin chewable tablet 81 mg  81 mg Oral Daily Zierle-Ghosh, Asia B, DO  81 mg at 04/17/21 0851  . Chlorhexidine Gluconate Cloth 2 % PADS 6 each  6 each Topical Daily Zierle-Ghosh, Asia B, DO   6 each at 04/17/21 0915  . diltiazem (CARDIZEM CD) 24 hr capsule 240 mg  240 mg Oral Daily Johnson, Clanford L, MD   240 mg at 04/17/21 0851  . ferrous sulfate tablet 325 mg  325 mg Oral Q breakfast Zierle-Ghosh, Asia B, DO   325 mg at 04/17/21 0851  . ondansetron (ZOFRAN) tablet 4 mg  4 mg Oral Q6H PRN Zierle-Ghosh, Asia B, DO       Or  . ondansetron (ZOFRAN) injection 4 mg  4 mg Intravenous Q6H PRN Zierle-Ghosh, Asia B, DO      . oxyCODONE (Oxy IR/ROXICODONE) immediate release tablet 5 mg  5 mg  Oral Q4H PRN Zierle-Ghosh, Asia B, DO      . risperiDONE (RISPERDAL) tablet 2 mg  2 mg Oral QHS Zierle-Ghosh, Asia B, DO   2 mg at 04/17/21 2203   Allergies:  Metoprolol and Sulfa antibiotics   Social History: The patient  reports that she quit smoking about 24 years ago. Her smoking use included cigarettes. She started smoking about 48 years ago. She has a 40.00 pack-year smoking history. She quit smokeless tobacco use about 26 years ago. She reports that she does not drink alcohol and does not use drugs.   Family History: The patient's family history includes Diabetes in her brother and mother; Hypertension in her brother, father, and mother.   ROS:  Please see the history of present illness. Otherwise, complete review of systems is positive for {NONE DEFAULTED:18576::"none"}.  All other systems are reviewed and negative.   Physical Exam: VS:  There were no vitals taken for this visit., BMI There is no height or weight on file to calculate BMI.  Wt Readings from Last 3 Encounters:  04/17/21 (!) 303 lb 2.1 oz (137.5 kg)  04/10/21 299 lb 2.6 oz (135.7 kg)  03/22/21 (!) 317 lb 7.4 oz (144 kg)    General: Patient appears comfortable at rest. HEENT: Conjunctiva and lids normal, oropharynx clear with moist mucosa. Neck: Supple, no elevated JVP or carotid bruits, no thyromegaly. Lungs: Clear to auscultation, nonlabored breathing at rest. Cardiac: Regular rate and rhythm, no S3 or significant systolic murmur, no pericardial rub. Abdomen: Soft, nontender, no hepatomegaly, bowel sounds present, no guarding or rebound. Extremities: No pitting edema, distal pulses 2+. Skin: Warm and dry. Musculoskeletal: No kyphosis. Neuropsychiatric: Alert and oriented x3, affect grossly appropriate.  ECG:  {EKG/Telemetry Strips Reviewed:[id number]}  Recent Labwork: 03/26/2021: Oneal Natriuretic Peptide 22.6; TSH 0.694 04/16/2021: ALT 106; AST 38 04/17/2021: BUN 7; Creatinine, Ser 0.99; Hemoglobin 10.9; Magnesium  1.7; Platelets 244; Potassium 3.7; Sodium 138     Component Value Date/Time   CHOL 186 02/16/2013 1353   TRIG 81 03/27/2021 0516    Other Studies Reviewed Today:  CT angio chest 4/26 >> saddle PE, RV:LV ratio 1.5, 8 mm Lt thyroid nodule  Doppler legs 4/27 >> occlusive DVT Rt calf veins through femoral vein into common femoral vein bifurcation; acute occlusive DVT Lt posterior tibial veins  Echo 4/28 >> EF 50 to 55%, mild LVH, flattened IV septum, mod RV systolic dysfx with mod enlargement, RVSP 35.5 mmHg, mild/mod RA enlargement, small pericardial effusion   Assessment and Plan:  1. Acute saddle pulmonary embolism, unspecified whether acute cor pulmonale present ([place])   2. Acute deep vein thrombosis (DVT) of distal vein of both lower  extremities ([place])   3. Essential hypertension      Medication Adjustments/Labs and Tests Ordered: Current medicines are reviewed at length with the patient today.  Concerns regarding medicines are outlined above.   Disposition: Follow-up with ***  Signed, Levell July, NP 04/17/2021 11:28 PM    [place] [facility] [place], [place], [place] [address] Phone: [phone]; Fax: [phone]

## 2021-04-17 NOTE — Progress Notes (Signed)
  Echocardiogram 2D Echocardiogram has been performed.  Jennette Dubin 04/17/2021, 11:35 AM

## 2021-04-17 NOTE — Progress Notes (Addendum)
[place] for heparin >> apixaban Indication: pulmonary embolus  Allergies  Allergen Reactions  . Metoprolol Other (See Comments)    Fatigue and malaise; wheezing  . Sulfa Antibiotics     Patient Measurements: Height: 5\' 6"  (167.6 cm) Weight: 134 kg (295 lb 6.7 oz) IBW/kg (Calculated) : 59.3 Heparin Dosing Weight: 96.9 kg  Vital Signs: Temp: 99.4 F (37.4 C) (04/28 0600) Temp Source: Oral (04/28 0600) BP: 122/88 (04/28 0600) Pulse Rate: 115 (04/28 0600)  Labs: Recent Labs    04/15/21 1635 04/15/21 1854 04/16/21 0209 04/16/21 0214 04/16/21 1235 04/17/21 0526  HGB 12.7  --   --  11.3*  --  10.9*  HCT 41.7  --   --  37.0  --  35.2*  PLT 198  --   --  208  --  244  APTT 30  --   --   --   --   --   LABPROT 15.5*  --   --   --   --   --   INR 1.2  --   --   --   --   --   HEPARINUNFRC <0.10*  --  0.50  --  0.48 0.31  CREATININE 1.27*  --   --  1.09*  --  0.99  TROPONINIHS 94* 92*  --   --   --   --     Estimated Creatinine Clearance: 81.9 mL/min (by C-G formula based on SCr of 0.99 mg/dL).   Medical History: Past Medical History:  Diagnosis Date  . Anxiety 03/12/2013  . Degenerative disc disease, lumbar    Epidural injections in 2007  . Diabetes mellitus without complication ([place])   . Endometriosis   . Frequent PVCs    12,000/24 hours  . Hypertension   . Otalgia, unspecified   . Palpitations 10/04/2010  . Vascular bruit 01/2013   Right neck    Assessment: Megan Oneal is a 65 y.o. female with past medical history of recent admission with discharge 4 days ago for acute encephalopathy where patient was intubated. Patient presents for acute onset of shortness of breath 2 days ago. CT shows saddle PE with right heart strain. Pharmacy has been consulted to start heparin for PE. Patient not on anticoagulation prior to admission. Hemoglobin and platelets wnl. Troponin elevated. SCr elevated to 1.27.    Goal of Therapy:    Monitor platelets by anticoagulation protocol: Yes   Plan:  Stop heparin infusion. Start apixaban 10 mg twice daily x 7 days followed by apixaban 5 mg twice daily.    Margot Ables, PharmD Clinical Pharmacist 04/17/2021 7:43 AM

## 2021-04-17 NOTE — Progress Notes (Signed)
[facility]   Patient name: Megan Oneal Admit date: 04/15/2021  DOB: Nov 11, 1956 LOS: 2  MRN: [id number] Consult date: 04/16/2021  Referring provider: Dr. Wynetta Emery, [facility]: Short of breath    History:  65 yo female former smoker presented to [facility] on 4/26 with 2 days of dyspnea.  She had recent hospitalization at [facility] (03/26/21 to 04/10/21) for altered mental status with compromised airway requiring intubation that was felt to be related to accidental flexeril overdose.  She was on lovenox for DVT prophylaxis during this hospitalization.  She has been on premarin.  She was found to have tachycardia at her PCP office.  She was advised to come to ER.  CT angio chest showed saddle PE with RV strain pattern, and doppler legs showed b/l lower leg DVTs.  She was started on heparin infusion.  She did not require supplemental oxygen.  Had mild elevation in troponin.  Past medical history:  HTN, DM type 2, CAD, PTSD, Morbid obesity with BMI 47.61, Endometriosis, Anxiety  Significant events:  4/26 Admit, start heparin gtt 428 transition to eliquis  Studies:   CT angio chest 4/26 >> saddle PE, RV:LV ratio 1.5, 8 mm Lt thyroid nodule  Doppler legs 4/27 >> occlusive DVT Rt calf veins through femoral vein into common femoral vein bifurcation; acute occlusive DVT Lt posterior tibial veins  Echo 4/28 >> EF 50 to 55%, mild LVH, flattened IV septum, mod RV systolic dysfx with mod enlargement, RVSP 35.5 mmHg, mild/mod RA enlargement, small pericardial effusion  Micro:  COVID/Flu 4/26 >> negative  Lines:     Antibiotics:    Consults:      Interim history:  Breathing okay.  No having chest or leg pain.  Vital signs:  BP 128/79   Pulse 100   Temp 99.4 F (37.4 C) (Oral)   Resp 19   Ht 5\' 6"  (1.676 m)   Wt 134 kg   SpO2 96%   BMI 47.68 kg/m   Intake/output:  I/O last 3 completed shifts: In: 2512.4 [P.O.:1560; I.V.:952.4] Out: 1275  [Urine:1275]   Physical exam:   General - alert Eyes - pupils reactive ENT - no sinus tenderness, no stridor Cardiac - regular rate/rhythm, no murmur Chest - equal breath sounds b/l, no wheezing or rales Abdomen - soft, non tender, + bowel sounds Extremities - ted hose on, 2+ Rt > Lt edema Skin - no rashes Neuro - normal strength, moves extremities, follows commands  Best practice:   DVT - heparin gtt SUP - not indicated Nutrition - carb modified, heart healthy Mobility - OOB to chair   Assessment/plan:   Acute saddle PE with bilateral lower leg DVT. - likely related to recent hospitalization with critical illness and premarin use for endometriosis - transitioned to eliquis on 4/28 - has RV strain on Echo; this should resolve over next couple of weeks, and she should have follow up Echo as an outpt in 3 to 4 months  Sinus tachycardia in setting of acute PE. Mildly elevated troponin from demand ischemia in setting of PE. Hx of HTN. - cardizem per primary team  History of auditory hallucinations. - risperdal per primary team  PCCM will sign off.  Please call if additional help needed while she is in hospital.  Resolved hospital problems:    Goals of care/Family discussions:  Code status: full code  Labs:   CMP Latest Ref Rng & Units 04/17/2021 04/16/2021 04/15/2021  Glucose 70 - 99 mg/dL  123(H) 118(H) 121(H)  BUN 8 - 23 mg/dL 7(L) 10 12  Creatinine 0.44 - 1.00 mg/dL 0.99 1.09(H) 1.27(H)  Sodium 135 - 145 mmol/L 138 137 136  Potassium 3.5 - 5.1 mmol/L 3.7 4.1 4.3  Chloride 98 - 111 mmol/L 105 105 101  CO2 22 - 32 mmol/L 24 22 20(L)  Calcium 8.9 - 10.3 mg/dL 8.5(L) 8.7(L) 9.1  Total Protein 6.5 - 8.1 g/dL - 6.5 7.2  Total Bilirubin 0.3 - 1.2 mg/dL - 0.8 0.6  Alkaline Phos 38 - 126 U/L - 57 64  AST 15 - 41 U/L - 38 49(H)  ALT 0 - 44 U/L - 106(H) 135(H)    CBC Latest Ref Rng & Units 04/17/2021 04/16/2021 04/15/2021  WBC 4.0 - 10.5 K/uL 8.7 9.5 9.5  Hemoglobin 12.0 -  15.0 g/dL 10.9(L) 11.3(L) 12.7  Hematocrit 36.0 - 46.0 % 35.2(L) 37.0 41.7  Platelets 150 - 400 K/uL 244 208 198    Signature:   Chesley Mires, MD [place] Pager - [phone] 04/17/2021, 3:28 PM

## 2021-04-18 ENCOUNTER — Ambulatory Visit: Payer: Medicare HMO | Admitting: Family Medicine

## 2021-04-18 DIAGNOSIS — I824Z3 Acute embolism and thrombosis of unspecified deep veins of distal lower extremity, bilateral: Secondary | ICD-10-CM

## 2021-04-18 DIAGNOSIS — I10 Essential (primary) hypertension: Secondary | ICD-10-CM

## 2021-04-18 DIAGNOSIS — I2692 Saddle embolus of pulmonary artery without acute cor pulmonale: Secondary | ICD-10-CM

## 2021-04-18 LAB — CBC
HCT: 34.8 % — ABNORMAL LOW (ref 36.0–46.0)
Hemoglobin: 10.8 g/dL — ABNORMAL LOW (ref 12.0–15.0)
MCH: 28.3 pg (ref 26.0–34.0)
MCHC: 31 g/dL (ref 30.0–36.0)
MCV: 91.1 fL (ref 80.0–100.0)
Platelets: 244 10*3/uL (ref 150–400)
RBC: 3.82 MIL/uL — ABNORMAL LOW (ref 3.87–5.11)
RDW: 16.8 % — ABNORMAL HIGH (ref 11.5–15.5)
WBC: 6.5 10*3/uL (ref 4.0–10.5)
nRBC: 0 % (ref 0.0–0.2)

## 2021-04-18 LAB — CULTURE, FUNGUS WITHOUT SMEAR

## 2021-04-18 MED ORDER — APIXABAN (ELIQUIS) VTE STARTER PACK (10MG AND 5MG): ORAL_TABLET | ORAL | 0 refills | Status: DC

## 2021-04-18 MED ORDER — ACETAMINOPHEN 325 MG PO TABS
650.0000 mg | ORAL_TABLET | Freq: Four times a day (QID) | ORAL | Status: AC | PRN
Start: 2021-04-18 — End: ?

## 2021-04-18 MED ORDER — DILTIAZEM HCL ER COATED BEADS 240 MG PO CP24: 240.0000 mg | ORAL_CAPSULE | Freq: Every day | ORAL | 2 refills | Status: DC

## 2021-04-18 NOTE — Discharge Summary (Signed)
Physician Discharge Summary  Megan Oneal [id number] DOB: 02-20-1956 DOA: 04/15/2021  PCP: Sharilyn Sites, MD  Admit date: 04/15/2021 Discharge date: 04/18/2021  Admitted From:  Home w [place] Disposition: Home with Home health  Recommendations for Outpatient Follow-up:  1. Follow up with PCP in 1 weeks 2. Follow up with cardiology appt later today at 1:30p 3. Please obtain CBC in 1 week to follow Hg on full anticoagulation 4. Bleeding precautions while on anticoagulant therapy  Home Health:  RN, PT  Discharge Condition: STABLE   CODE STATUS: FULL  DIET: Heart Healthy   Brief Hospitalization Summary: Please see all hospital notes, images, labs for full details of the hospitalization. ADMISSION HPI:  Brief Admission History:  65 y.o.female,with history of psychiatric disorder, hypertension, diabetes mellitus without complication, and more presents the ED with chief complaint of dyspnea. Patient reports that she had sudden onset of dyspnea on the 24th. She had just been discharged from the hospital on the 22nd after 2-week stay. She reports that the dyspnea has been worse since it started. She reports no chest pain, no cough, no fever. She has had palpitations. She never had anything like this before. Patient has no history of cancer, no long trips. She is on Premarin. Patient reports that her dyspnea is worse with exertion, she denies orthopnea. She presented to her PCP today and was noted to be tachycardic and complaining of dyspnea so she was sent to the ED.  Pt was noted to have acute PE with right heart strain.  Pt started on IV heparin infusion and admitted.   Assessment & Plan:   Principal Problem:   Pulmonary embolus Active Problems:   Obesity   AKI (acute kidney injury)   Transaminitis  Acute pulmonary embolus with right heart strain/ Acute DVT Bilateral lower extremities- these patients are associated with higher mortality, requested pulmonary consultation, Pt was  treated with IV heparin infusion for greater than 24 hours and now transitioned to apixaban per pharm D with plan to start 10 mg BID x 7 days, then 5 mg BID.  Bleeding precautions.  Ultrasound of lower extremities reveal bilateral occlusive DVT.  TED hose ordered to try and prevent post thrombotic phlebitis.    2D echocardiogram 4/28 1. Left ventricular ejection fraction, by estimation, is 50 to 55%. The left ventricle has low normal function. The left ventricle has no regional wall motion abnormalities. There is mild left ventricular hypertrophy. Left ventricular diastolic parameters are indeterminate. There is the interventricular septum is flattened in diastole ('D' shaped left ventricle), consistent with right ventricular volume overload.  2. Right ventricular systolic function is moderately reduced. The right ventricular size is moderately enlarged. There is upper normal pulmonary artery systolic pressure. The estimated right ventricular systolic pressure is [id number] mmHg.  3. Right atrial size was mild to moderately dilated.  4. A small pericardial effusion is present. The pericardial effusion is posterior to the left ventricle.  5. The mitral valve is grossly normal. Trivial mitral valve regurgitation.  6. The aortic valve is tricuspid. Aortic valve regurgitation is not visualized.  7. The inferior vena cava is dilated in size with >50% respiratory variability, suggesting right atrial pressure of 8 mmHg.  Auditory hallucinations - recent behavioral health hospitalization, symptoms seem to be resolved now continue risperidone 2 mg as ordered by psychiatry team.  Sinus Tachycardia, chronic- this is more than likely exacerbated by saddle pulmonary embolus.  She had been discharged recently on Cardizem CD to 240 mg daily however  has not been taking it at home and now having rebound tachycardia. We have resumed Cardizem CD 240 mg daily.  HR is better controlled.      Normocytic anemia - no  bleeding has been reported. Follow Hg, continue iron supplement.   DVT prophylaxis: Apixaban Code Status: Full Family Communication: sister updated telephone 4/28 Disposition: home with [facility]  Discharge Diagnoses:  Principal Problem:   Pulmonary embolus ([place]) Active Problems:   Obesity   AKI (acute kidney injury) ([place])   Transaminitis  Discharge Instructions:  Allergies as of 04/18/2021      Reactions   Metoprolol Other (See Comments)   Fatigue and malaise; wheezing   Sulfa Antibiotics       Medication List    STOP taking these medications   aspirin 81 MG tablet   ibuprofen 600 MG tablet Commonly known as: ADVIL   prednisoLONE acetate 1 % ophthalmic suspension Commonly known as: PRED FORTE   Premarin vaginal cream Generic drug: conjugated estrogens     TAKE these medications   acetaminophen 325 MG tablet Commonly known as: TYLENOL Take 2 tablets (650 mg total) by mouth every 6 (six) hours as needed for mild pain (or Fever >/= 101).   Apixaban Starter Pack (10mg  and 5mg ) Commonly known as: ELIQUIS STARTER PACK Take as directed on package: start with two-5mg  tablets twice daily for 7 days. On day 8, switch to one-5mg  tablet twice daily.   cholecalciferol 25 MCG (1000 UNIT) tablet Commonly known as: VITAMIN D3 Take 1,000 Units by mouth daily.   dexlansoprazole 60 MG capsule Commonly known as: DEXILANT Take 60 mg by mouth daily.   diltiazem 240 MG 24 hr capsule Commonly known as: CARDIZEM CD Take 1 capsule (240 mg total) by mouth daily.   ferrous sulfate 325 (65 FE) MG tablet Take 325 mg by mouth daily with breakfast.   fluticasone 50 MCG/ACT nasal spray Commonly known as: FLONASE Place 2 sprays into both nostrils daily as needed for allergies.   furosemide 40 MG tablet Commonly known as: LASIX Take 40 mg by mouth every other day.   ipratropium 0.03 % nasal spray Commonly known as: ATROVENT Place 2 sprays into both nostrils 3 (three) times daily as  needed for rhinitis.   polyethylene glycol powder 17 GM/SCOOP powder Commonly known as: GLYCOLAX/MIRALAX 1 scoop daily or as needed   risperiDONE 2 MG tablet Commonly known as: RISPERDAL Take 1 tablet (2 mg total) by mouth at bedtime.   vitamin C 500 MG tablet Commonly known as: ASCORBIC ACID Take 500 mg by mouth daily.       Follow-up Information    Sharilyn Sites, MD. Schedule an appointment as soon as possible for a visit in 1 week(s).   Specialty: Family Medicine Why: Hospital Follow Up  Contact information: [address] [place] [id number] [phone]        Verta Ellen., NP. Go on 04/18/2021.   Specialty: Cardiology Contact information: [place] [phone]              Allergies  Allergen Reactions  . Metoprolol Other (See Comments)    Fatigue and malaise; wheezing  . Sulfa Antibiotics    Allergies as of 04/18/2021      Reactions   Metoprolol Other (See Comments)   Fatigue and malaise; wheezing   Sulfa Antibiotics       Medication List    STOP taking these medications   aspirin  81 MG tablet   ibuprofen 600 MG tablet Commonly known as: ADVIL   prednisoLONE acetate 1 % ophthalmic suspension Commonly known as: PRED FORTE   Premarin vaginal cream Generic drug: conjugated estrogens     TAKE these medications   acetaminophen 325 MG tablet Commonly known as: TYLENOL Take 2 tablets (650 mg total) by mouth every 6 (six) hours as needed for mild pain (or Fever >/= 101).   Apixaban Starter Pack (10mg  and 5mg ) Commonly known as: ELIQUIS STARTER PACK Take as directed on package: start with two-5mg  tablets twice daily for 7 days. On day 8, switch to one-5mg  tablet twice daily.   cholecalciferol 25 MCG (1000 UNIT) tablet Commonly known as: VITAMIN D3 Take 1,000 Units by mouth daily.   dexlansoprazole 60 MG capsule Commonly known as: DEXILANT Take 60 mg by mouth daily.   diltiazem 240 MG  24 hr capsule Commonly known as: CARDIZEM CD Take 1 capsule (240 mg total) by mouth daily.   ferrous sulfate 325 (65 FE) MG tablet Take 325 mg by mouth daily with breakfast.   fluticasone 50 MCG/ACT nasal spray Commonly known as: FLONASE Place 2 sprays into both nostrils daily as needed for allergies.   furosemide 40 MG tablet Commonly known as: LASIX Take 40 mg by mouth every other day.   ipratropium 0.03 % nasal spray Commonly known as: ATROVENT Place 2 sprays into both nostrils 3 (three) times daily as needed for rhinitis.   polyethylene glycol powder 17 GM/SCOOP powder Commonly known as: GLYCOLAX/MIRALAX 1 scoop daily or as needed   risperiDONE 2 MG tablet Commonly known as: RISPERDAL Take 1 tablet (2 mg total) by mouth at bedtime.   vitamin C 500 MG tablet Commonly known as: ASCORBIC ACID Take 500 mg by mouth daily.       Procedures/Studies: DG Chest 1 View  Result Date: 03/28/2021 CLINICAL DATA:  Hypoxia.  Central catheter placement EXAM: CHEST  1 VIEW COMPARISON:  March 28, 2021 study obtained earlier in the day FINDINGS: Central catheter tip is at the cavoatrial junction. Endotracheal tube tip is 3.1 cm above the carina. Nasogastric tube tip and side port in stomach. No pneumothorax. There is mild left base atelectasis. The lungs elsewhere are clear. Heart is enlarged with pulmonary vascularity within normal limits. No adenopathy. No bone lesions. IMPRESSION: Tube and catheter positions as described without pneumothorax. No edema or consolidation. Left base atelectasis. There is cardiomegaly. No adenopathy. Electronically Signed   By: Lowella Grip III M.D.   On: 03/28/2021 13:11   DG Chest 2 View  Result Date: 04/15/2021 CLINICAL DATA:  Shortness of breath. EXAM: CHEST - 2 VIEW COMPARISON:  03/28/2021 FINDINGS: Heart size is normal. No pleural effusion or edema identified. No airspace densities identified. Mild spondylosis in the thoracic spine. Review of the  visualized osseous structures are unremarkable. IMPRESSION: No active cardiopulmonary abnormalities. Electronically Signed   By: Kerby Moors M.D.   On: 04/15/2021 16:23   CT Head Wo Contrast  Result Date: 03/26/2021 CLINICAL DATA:  Change in mental status.  Unknown cause. EXAM: CT HEAD WITHOUT CONTRAST TECHNIQUE: Contiguous axial images were obtained from the base of the skull through the vertex without intravenous contrast. COMPARISON:  MRI brain 03/21/2021.  CT head 03/21/2021 FINDINGS: Brain: No evidence of acute infarction, hemorrhage, hydrocephalus, extra-axial collection or mass lesion/mass effect. Vascular: No hyperdense vessel or unexpected calcification. Skull: Calvarium appears intact. Sinuses/Orbits: Paranasal sinuses and mastoid air cells are clear. Other: Congenital nonunion of the posterior  arch of C1. IMPRESSION: No acute intracranial abnormalities. Electronically Signed   By: Lucienne Capers M.D.   On: 03/26/2021 19:08   CT Head Wo Contrast  Result Date: 03/21/2021 CLINICAL DATA:  Neuro deficit, acute, stroke suspected. Additional history provided: Patient reports dizziness EXAM: CT HEAD WITHOUT CONTRAST TECHNIQUE: Contiguous axial images were obtained from the base of the skull through the vertex without intravenous contrast. COMPARISON:  No pertinent prior exams available for comparison. FINDINGS: Brain: Cerebral volume is normal. There is no acute intracranial hemorrhage. No demarcated cortical infarct. No extra-axial fluid collection. No evidence of intracranial mass. No midline shift. Vascular: No hyperdense vessel.  Atherosclerotic calcifications. Skull: Normal. Negative for fracture or focal lesion. Sinuses/Orbits: Visualized orbits show no acute finding. No significant paranasal sinus disease at the imaged levels. Other: Congenital nonunion of the posterior arch of C1. IMPRESSION: Unremarkable non-contrast CT appearance of the brain. No evidence of acute intracranial abnormality.  Electronically Signed   By: Kellie Simmering DO   On: 03/21/2021 12:38   CT Angio Chest PE W/Cm &/Or Wo Cm  Result Date: 04/15/2021 CLINICAL DATA:  PE suspected high probability, shortness of breath x2 days. EXAM: CT ANGIOGRAPHY CHEST WITH CONTRAST TECHNIQUE: Multidetector CT imaging of the chest was performed using the standard protocol during bolus administration of intravenous contrast. Multiplanar CT image reconstructions and MIPs were obtained to evaluate the vascular anatomy. CONTRAST:  13mL OMNIPAQUE IOHEXOL 350 MG/ML SOLN COMPARISON:  Chest radiograph from same day and March 28, 2021. FINDINGS: Cardiovascular: Satisfactory opacification of the pulmonary arteries to the segmental level. Saddle type pulmonary embolus at the takeoff of the bilateral main pulmonary artery branches with extension into the bilateral lobar and segmental pulmonary arteries. The right ventricular to left ventricular ratio is 1.5 with enlargement of the main pulmonary artery measuring 3.2 cm as well as reflux of contrast into the IVC and hepatic veins. Right-sided cardiac enlargement. No suspicious intracardiac filling defect visualized. No pericardial effusion. A few calcified aortic atherosclerotic plaques. No thoracic aortic aneurysm. Mediastinum/Nodes: Hypodense 8 mm nodule in the left lobe of the thyroid Not clinically significant; no follow-up imaging recommended (ref: J Am Coll Radiol. 2015 Feb;12(2): 143-50).No pathologically enlarged mediastinal, hilar or axillary lymph nodes. Trachea esophagus are grossly unremarkable. Lungs/Pleura: Lungs are clear. No pleural effusion or pneumothorax. Upper Abdomen: No acute abnormality. Musculoskeletal: No chest wall abnormality. No acute or significant osseous findings. Review of the MIP images confirms the above findings. Critical Value/emergent results were called by telephone at the time of interpretation on 04/15/2021 at 6:28 pm to provider Dr Roderic Palau , who verbally acknowledged these  results. IMPRESSION: 1. Sinal type pulmonary embolus with CT evidence of right heart strain (RV/LV Ratio 1.5) consistent with at least submassive (intermediate risk) PE. The presence of right heart strain has been associated with an increased risk of morbidity and mortality. 2. Aortic atherosclerosis.  Aortic Atherosclerosis (ICD10-I70.0). Critical Value/emergent results were called by telephone at the time of interpretation on 04/15/2021 at 6:28 pm to provider Dr Roderic Palau , who verbally acknowledged these results. Electronically Signed   By: Dahlia Bailiff MD   On: 04/15/2021 18:34   MR BRAIN WO CONTRAST  Result Date: 03/27/2021 CLINICAL DATA:  Mental status change. EXAM: MRI HEAD WITHOUT CONTRAST TECHNIQUE: Multiplanar, multiecho pulse sequences of the brain and surrounding structures were obtained without intravenous contrast. COMPARISON:  Head CT March 26, 2021 FINDINGS: Brain: No acute infarction, hemorrhage, hydrocephalus, extra-axial collection or mass lesion. A few small foci of T2  hyperintensity are seen within the white matter of the cerebral hemispheres, nonspecific. Vascular: Normal flow voids. Skull and upper cervical spine: Normal marrow signal. Sinuses/Orbits: Negative. IMPRESSION: 1. No acute intracranial abnormality. 2. A few small foci of T2 hyperintensity within the white matter of the cerebral hemispheres, nonspecific but may be seen with chronic microvascular ischemic changes. Electronically Signed   By: Pedro Earls M.D.   On: 03/27/2021 14:43   MR BRAIN WO CONTRAST  Result Date: 03/21/2021 CLINICAL DATA:  [place] EXAM: MRI HEAD WITHOUT CONTRAST TECHNIQUE: Multiplanar, multiecho pulse sequences of the brain and surrounding structures were obtained without intravenous contrast. COMPARISON:  None. FINDINGS: Brain: There is no acute infarction or intracranial hemorrhage. There is no intracranial mass, mass effect, or edema. There is no hydrocephalus or extra-axial fluid  collection. Ventricles and sulci are normal in size and configuration. Few punctate foci of T2 hyperintensity are present in the supratentorial white matter and may reflect minor chronic microvascular ischemic changes or gliosis/demyelination of other etiologies. Vascular: Major vessel flow voids at the skull base are preserved. Skull and upper cervical spine: Normal marrow signal is preserved. Sinuses/Orbits: Trace mucosal thickening.  Orbits are unremarkable. Other: Sella is unremarkable.  Mastoid air cells are clear. IMPRESSION: No evidence of recent infarction, hemorrhage, or mass. Probable minor chronic microvascular ischemic changes. Electronically Signed   By: Macy Mis M.D.   On: 03/21/2021 14:53   US Venous Img Lower Bilateral (DVT)  Result Date: 04/16/2021 CLINICAL DATA:  Pulmonary embolus identified yesterday. Assess for residual DVT. EXAM: BILATERAL LOWER EXTREMITY VENOUS DOPPLER ULTRASOUND TECHNIQUE: Gray-scale sonography with graded compression, as well as color Doppler and duplex ultrasound were performed to evaluate the lower extremity deep venous systems from the level of the common femoral vein and including the common femoral, femoral, profunda femoral, popliteal and calf veins including the posterior tibial, peroneal and gastrocnemius veins when visible. The superficial great saphenous vein was also interrogated. Spectral Doppler was utilized to evaluate flow at rest and with distal augmentation maneuvers in the common femoral, femoral and popliteal veins. COMPARISON:  None. FINDINGS: RIGHT LOWER EXTREMITY Common Femoral Vein: Patent and compressible in the proximal and mid segment. Thrombus does extend from the femoral vein just into the distal common femoral vein. Saphenofemoral Junction: No evidence of thrombus. Normal compressibility and flow on color Doppler imaging. Profunda Femoral Vein: Nonocclusive thrombus is present within the profunda femoral vein. Femoral Vein: The femoral  vein is not compressible. The lumen is filled with low-level internal echoes. No evidence of color flow on color Doppler imaging. Findings are consistent with occlusive thrombus. The thrombus extends just into the distal common femoral vein. Popliteal Vein: Occlusive thrombus extends into the popliteal vein. Calf Veins: Occlusive thrombus extends into the calf veins. Superficial Great Saphenous Vein: No evidence of thrombus. Normal compressibility. Venous Reflux:  None. Other Findings:  None. LEFT LOWER EXTREMITY Common Femoral Vein: No evidence of thrombus. Normal compressibility, respiratory phasicity and response to augmentation. Saphenofemoral Junction: No evidence of thrombus. Normal compressibility and flow on color Doppler imaging. Profunda Femoral Vein: No evidence of thrombus. Normal compressibility and flow on color Doppler imaging. Femoral Vein: No evidence of thrombus. Normal compressibility, respiratory phasicity and response to augmentation. Popliteal Vein: No evidence of thrombus. Normal compressibility, respiratory phasicity and response to augmentation. Calf Veins: The posterior tibial veins are expanded and contain low-level internal echoes. No evidence of color flow on color Doppler imaging. Findings are consistent with acute occlusive thrombus. Superficial Great  Saphenous Vein: No evidence of thrombus. Normal compressibility. Venous Reflux:  None. Other Findings:  None. IMPRESSION: 1. Positive for extensive occlusive thrombus within the RIGHT lower extremity extending from the calf veins through the femoral vein and just into the common femoral vein at the bifurcation. 2. Positive for isolated acute occlusive calf vein DVT in the LEFT posterior tibial veins. Electronically Signed   By: Jacqulynn Cadet M.D.   On: 04/16/2021 10:40   DG Chest Port 1 View  Result Date: 03/28/2021 CLINICAL DATA:  Acute respiratory failure with hypoxia. EXAM: PORTABLE CHEST 1 VIEW COMPARISON:  03/26/2021 FINDINGS:  Endotracheal and enteric tubes are unchanged in position. Shallow inspiration. Mild cardiac enlargement. Prominent pulmonary vascularity with bilateral basilar atelectasis. No definite pleural effusion or pneumothorax. Degenerative changes in the spine. IMPRESSION: Shallow inspiration with bilateral basilar atelectasis. Cardiac enlargement with pulmonary vascular congestion, similar to prior study. Electronically Signed   By: Lucienne Capers M.D.   On: 03/28/2021 01:21   DG Chest Portable 1 View  Result Date: 03/26/2021 CLINICAL DATA:  Intubation, placement of or of gastric tube, altered mental status EXAM: PORTABLE CHEST 1 VIEW COMPARISON:  Portable exam 1948 hours compared to 1803 hours FINDINGS: Tip of endotracheal tube projects 2.2 cm above carina. Orogastric tube extends into stomach. Enlargement of cardiac silhouette with vascular congestion. Mild RIGHT basilar atelectasis. Remaining lungs clear. No pleural effusion or pneumothorax. IMPRESSION: Mild RIGHT basilar atelectasis. Tube positions as above. Electronically Signed   By: Lavonia Dana M.D.   On: 03/26/2021 20:17   DG Chest Portable 1 View  Result Date: 03/26/2021 CLINICAL DATA:  Altered mental status, hypertension, diabetes mellitus EXAM: PORTABLE CHEST 1 VIEW COMPARISON:  Portable exam 1812 hours compared to 03/22/2021 FINDINGS: Enlargement of cardiac silhouette with pulmonary vascular congestion. Mediastinal contour stable. Decreased lung volumes with minimal bibasilar atelectasis. No acute infiltrate, pleural effusion, or pneumothorax. IMPRESSION: Enlargement of cardiac silhouette. Low lung volumes with bibasilar atelectasis. Electronically Signed   By: Lavonia Dana M.D.   On: 03/26/2021 18:41   DG Chest Portable 1 View  Result Date: 03/22/2021 CLINICAL DATA:  Chest tightness. EXAM: PORTABLE CHEST 1 VIEW COMPARISON:  July 23, 2008 FINDINGS: Cardiomegaly. The hila and mediastinum are unremarkable. No pneumothorax. Mild interstitial prominence  without overt edema. No suspicious infiltrates. IMPRESSION: Cardiomegaly. Suspected pulmonary venous congestion. No overt edema. Electronically Signed   By: Dorise Bullion III M.D   On: 03/22/2021 18:31   DG Abd Portable 1 View  Result Date: 03/26/2021 CLINICAL DATA:  Orogastric tube placement EXAM: PORTABLE ABDOMEN - 1 VIEW COMPARISON:  Portable exam 1949 hours without priors for comparison FINDINGS: Orogastric tube coiled in proximal stomach. Visualized bowel gas pattern normal. Prior lumbar fusion. Borderline enlargement of cardiac silhouette. Surgical clips RIGHT upper quadrant likely reflect prior cholecystectomy. IMPRESSION: Nasogastric tube coiled in proximal stomach. Electronically Signed   By: Lavonia Dana M.D.   On: 03/26/2021 20:16   EEG adult  Result Date: 04/02/2021 Theodosia Blender, MD     04/02/2021  8:02 PM [facility] EEG HISTORY:  65 year old female presents with altered mental status. INTRODUCTION:  A digital EEG was performed in the laboratory using the standard international 10/20 system of electrode placement in addition to one channel of EKG monitoring.  Hyperventilation was not performed. Photic Stimulation was performed.  This tracing captures wakefulness through drowsiness. This EEG was recording for about 20 minutes. DESCRIPTION OF RECORD:  In the most alert state the alpha rhythm is 9  Hz in frequency, which is seen in the occipital region and attenuates with eye opening and is bilaterally synchronous and symmetrical.  No focal slowing, sharp waves, or epileptiform discharges are seen. Photic stimulation was performed and did not produce any abnormalities. Heart rate was irregular at a rate of 120 bpm. IMPRESSION:  This is a normal adult EEG in the awake and drowsy states. No focal slowing, focal abnormalities, epileptiform discharges, or electrographic seizures are seen.  Of note, a normal EEG does not exclude the diagnosis of seizure disorder.  A repeat sleep  deprived EEG or 24 hour EEG could be considered.  The abnormality on single lead EKG could be further evaluated by 12 lead EKG.  Clinical correlation is recommended.   ECHOCARDIOGRAM COMPLETE  Result Date: 04/17/2021    ECHOCARDIOGRAM REPORT   Patient Name:   SHELBEY CONQUEST Date of Exam: 04/17/2021 Medical Rec #:  [id number]    Height:       66.0 in Accession #:    [id number]   Weight:       295.4 lb Date of Birth:  1956/12/05    BSA:          2.360 m Patient Age:    83 years     BP:           123/70 mmHg Patient Gender: F            HR:           111 bpm. Exam Location:  [facility] Procedure: 2D Echo Indications:    Pulmonary Embolus I26.09  History:        Patient has prior history of Echocardiogram examinations, most                 recent 11/28/2020. Arrythmias:PVC; Risk Factors:Hypertension and                 Diabetes.  Sonographer:    Mikki Santee RDCS (AE) Referring Phys: [id number] ASIA B Brandon  1. Left ventricular ejection fraction, by estimation, is 50 to 55%. The left ventricle has low normal function. The left ventricle has no regional wall motion abnormalities. There is mild left ventricular hypertrophy. Left ventricular diastolic parameters are indeterminate. There is the interventricular septum is flattened in diastole ('D' shaped left ventricle), consistent with right ventricular volume overload.  2. Right ventricular systolic function is moderately reduced. The right ventricular size is moderately enlarged. There is upper normal pulmonary artery systolic pressure. The estimated right ventricular systolic pressure is [id number] mmHg.  3. Right atrial size was mild to moderately dilated.  4. A small pericardial effusion is present. The pericardial effusion is posterior to the left ventricle.  5. The mitral valve is grossly normal. Trivial mitral valve regurgitation.  6. The aortic valve is tricuspid. Aortic valve regurgitation is not visualized.  7. The inferior vena cava is dilated in  size with >50% respiratory variability, suggesting right atrial pressure of 8 mmHg. FINDINGS  Left Ventricle: Left ventricular ejection fraction, by estimation, is 50 to 55%. The left ventricle has low normal function. The left ventricle has no regional wall motion abnormalities. The left ventricular internal cavity size was normal in size. There is mild left ventricular hypertrophy. The interventricular septum is flattened in diastole ('D' shaped left ventricle), consistent with right ventricular volume overload. Left ventricular diastolic parameters are indeterminate. Right Ventricle: The right ventricular size is moderately enlarged. No increase in right ventricular wall thickness. Right ventricular systolic function is  moderately reduced. There is normal pulmonary artery systolic pressure. The tricuspid regurgitant velocity is 2.62 m/s, and with an assumed right atrial pressure of 8 mmHg, the estimated right ventricular systolic pressure is [id number] mmHg. Left Atrium: Left atrial size was normal in size. Right Atrium: Right atrial size was mild to moderately dilated. Pericardium: A small pericardial effusion is present. The pericardial effusion is posterior to the left ventricle. Mitral Valve: The mitral valve is grossly normal. Trivial mitral valve regurgitation. Tricuspid Valve: The tricuspid valve is grossly normal. Tricuspid valve regurgitation is mild. Aortic Valve: The aortic valve is tricuspid. Aortic valve regurgitation is not visualized. Pulmonic Valve: The pulmonic valve was grossly normal. Pulmonic valve regurgitation is mild. Aorta: The aortic root is normal in size and structure. Venous: The inferior vena cava is dilated in size with greater than 50% respiratory variability, suggesting right atrial pressure of 8 mmHg. IAS/Shunts: No atrial level shunt detected by color flow Doppler.  LEFT VENTRICLE PLAX 2D LVIDd:         4.32 cm LVIDs:         3.15 cm LV PW:         1.17 cm LV IVS:        1.10 cm LVOT  diam:     2.10 cm LV SV:         63 LV SV Index:   27 LVOT Area:     3.46 cm  RIGHT VENTRICLE RV S prime:     10.40 cm/s LEFT ATRIUM             Index       RIGHT ATRIUM           Index LA diam:        3.40 cm 1.44 cm/m  RA Area:     28.00 cm LA Vol (A2C):   41.4 ml 17.54 ml/m RA Volume:   111.00 ml 47.03 ml/m LA Vol (A4C):   46.1 ml 19.53 ml/m LA Biplane Vol: 44.5 ml 18.85 ml/m  AORTIC VALVE LVOT Vmax:   113.00 cm/s LVOT Vmean:  67.800 cm/s LVOT VTI:    0.183 m  AORTA Ao Root diam: 3.20 cm TRICUSPID VALVE TR Peak grad:   27.5 mmHg TR Vmax:        262.00 cm/s  SHUNTS Systemic VTI:  0.18 m Systemic Diam: 2.10 cm Rozann Lesches MD Electronically signed by Rozann Lesches MD Signature Date/Time: 04/17/2021/11:54:58 AM    Final    DG FL GUIDED LUMBAR PUNCTURE  Result Date: 03/27/2021 CLINICAL DATA:  Acute encephalopathy. EXAM: DIAGNOSTIC LUMBAR PUNCTURE UNDER FLUOROSCOPIC GUIDANCE COMPARISON:  MRI lumbar spine 04/25/2008. FLUOROSCOPY TIME:  Fluoroscopy Time:  0 minutes 54 seconds Radiation Exposure Index (if provided by the fluoroscopic device): 42.9 mGy Number of Acquired Spot Images: 1 PROCEDURE: After discussing the risks and benefits of this procedure with the patient's sister informed consent was obtained. Risk of bleeding, infection, allergy, and need for blood patch discussed with patient's sister. With the patient prone, the lower back was prepped with Betadine. 1% Lidocaine was used for local anesthesia. Lumbar puncture was performed at the L5-S1 level using a 22 gauge needle with return of clear CSF. Opening pressure not obtained due to patient's clinical condition. 8 ml of CSF were obtained for laboratory studies. The patient tolerated the procedure well and there were no apparent complications. IMPRESSION: Successful fluoroscopically directed lumbar puncture. 8 cc of clear CSF obtained and sent to the laboratory for ordered studies. Electronically  Signed   By: Marcello Moores  Register   On: 03/27/2021  14:59      Subjective: Pt reports she is feeling well, no complaints, no CP and no SOB.   Discharge Exam: Vitals:   04/18/21 0207 04/18/21 0613  BP: 135/73 112/71  Pulse: (!) 104 (!) 109  Resp: 20 20  Temp: 98.1 F (36.7 C) 98.3 F (36.8 C)  SpO2: 96% 97%   Vitals:   04/17/21 1607 04/17/21 2210 04/18/21 0207 04/18/21 0613  BP: 121/71 138/71 135/73 112/71  Pulse: (!) 110 (!) 107 (!) 104 (!) 109  Resp:  20 20 20   Temp:  98.4 F (36.9 C) 98.1 F (36.7 C) 98.3 F (36.8 C)  TempSrc:  Oral Oral Oral  SpO2: 98% 99% 96% 97%  Weight: (!) 137.5 kg     Height: 5\' 6"  (1.676 m)      General exam: hard of hearing.  Appears calm and comfortable  Respiratory system: Clear to auscultation. Respiratory effort normal. Cardiovascular system: normal S1 & S2 heard. No JVD, murmurs, rubs, gallops or clicks. No pedal edema. Gastrointestinal system: Abdomen is nondistended, soft and nontender. No organomegaly or masses felt. Normal bowel sounds heard. Central nervous system: Alert and oriented. No focal neurological deficits. Extremities: no cords palpated but trace edema BLEs.  Pulses palpated BLEs.  Symmetric 5 x 5 power. Skin: No rashes, lesions or ulcers Psychiatry: Judgement and insight appear normal. Mood & affect flat.    The results of significant diagnostics from this hospitalization (including imaging, microbiology, ancillary and laboratory) are listed below for reference.     Microbiology: Recent Results (from the past 240 hour(s))  Resp Panel by RT-PCR (Flu A&B, Covid) Nasopharyngeal Swab     Status: None   Collection Time: 04/15/21  5:04 PM   Specimen: Nasopharyngeal Swab; Nasopharyngeal(NP) swabs in vial transport medium  Result Value Ref Range Status   SARS Coronavirus 2 by RT PCR NEGATIVE NEGATIVE Final    Comment: (NOTE) SARS-CoV-2 target nucleic acids are NOT DETECTED.  The SARS-CoV-2 RNA is generally detectable in upper respiratory specimens during the acute phase of  infection. The lowest concentration of SARS-CoV-2 viral copies this assay can detect is 138 copies/mL. A negative result does not preclude SARS-Cov-2 infection and should not be used as the sole basis for treatment or other patient management decisions. A negative result may occur with  improper specimen collection/handling, submission of specimen other than nasopharyngeal swab, presence of viral mutation(s) within the areas targeted by this assay, and inadequate number of viral copies(<138 copies/mL). A negative result must be combined with clinical observations, patient history, and epidemiological information. The expected result is Negative.  Fact Sheet for Patients:  EntrepreneurPulse.com.au  Fact Sheet for Healthcare Providers:  IncredibleEmployment.be  This test is no t yet approved or cleared by the [place] FDA and  has been authorized for detection and/or diagnosis of SARS-CoV-2 by FDA under an Emergency Use Authorization (EUA). This EUA will remain  in effect (meaning this test can be used) for the duration of the COVID-19 declaration under Section 564(b)(1) of the Act, 21 U.S.C.section 360bbb-3(b)(1), unless the authorization is terminated  or revoked sooner.       Influenza A by PCR NEGATIVE NEGATIVE Final   Influenza B by PCR NEGATIVE NEGATIVE Final    Comment: (NOTE) The Xpert Xpress SARS-CoV-2/FLU/RSV plus assay is intended as an aid in the diagnosis of influenza from Nasopharyngeal swab specimens and should not be used as a sole basis  for treatment. Nasal washings and aspirates are unacceptable for Xpert Xpress SARS-CoV-2/FLU/RSV testing.  Fact Sheet for Patients: EntrepreneurPulse.com.au  Fact Sheet for Healthcare Providers: IncredibleEmployment.be  This test is not yet approved or cleared by the [place] FDA and has been authorized for detection and/or diagnosis of SARS-CoV-2  by FDA under an Emergency Use Authorization (EUA). This EUA will remain in effect (meaning this test can be used) for the duration of the COVID-19 declaration under Section 564(b)(1) of the Act, 21 U.S.C. section 360bbb-3(b)(1), unless the authorization is terminated or revoked.  Performed at [facility], [address], [place], [place] [address]      Labs: BNP (last 3 results) Recent Labs    03/26/21 1732  BNP 41.3   Basic Metabolic Panel: Recent Labs  Lab 04/15/21 1635 04/16/21 0214 04/17/21 0526  NA 136 137 138  K 4.3 4.1 3.7  CL 101 105 105  CO2 20* 22 24  GLUCOSE 121* 118* 123*  BUN 12 10 7*  CREATININE 1.27* 1.09* 0.99  CALCIUM 9.1 8.7* 8.5*  MG  --  1.8 1.7   Liver Function Tests: Recent Labs  Lab 04/15/21 1635 04/16/21 0214  AST 49* 38  ALT 135* 106*  ALKPHOS 64 57  BILITOT 0.6 0.8  PROT 7.2 6.5  ALBUMIN 3.6 3.2*   No results for input(s): LIPASE, AMYLASE in the last 168 hours. No results for input(s): AMMONIA in the last 168 hours. CBC: Recent Labs  Lab 04/15/21 1635 04/16/21 0214 04/17/21 0526 04/18/21 0405  WBC 9.5 9.5 8.7 6.5  NEUTROABS 4.8 3.7  --   --   HGB 12.7 11.3* 10.9* 10.8*  HCT 41.7 37.0 35.2* 34.8*  MCV 90.8 90.2 90.5 91.1  PLT 198 208 244 244   Cardiac Enzymes: No results for input(s): CKTOTAL, CKMB, CKMBINDEX, TROPONINI in the last 168 hours. BNP: Invalid input(s): POCBNP CBG: No results for input(s): GLUCAP in the last 168 hours. D-Dimer No results for input(s): DDIMER in the last 72 hours. Hgb A1c No results for input(s): HGBA1C in the last 72 hours. Lipid Profile No results for input(s): CHOL, HDL, LDLCALC, TRIG, CHOLHDL, LDLDIRECT in the last 72 hours. Thyroid function studies No results for input(s): TSH, T4TOTAL, T3FREE, THYROIDAB in the last 72 hours.  Invalid input(s): FREET3 Anemia work up No results for input(s): VITAMINB12, FOLATE, FERRITIN, TIBC, IRON, RETICCTPCT in the last 72 hours. Urinalysis     Component Value Date/Time   COLORURINE COLORLESS (A) 03/26/2021 1726   APPEARANCEUR CLEAR (A) 03/26/2021 1726   APPEARANCEUR Clear 03/27/2014 0830   LABSPEC 1.002 (L) 03/26/2021 1726   LABSPEC 1.013 03/27/2014 0830   PHURINE 7.0 03/26/2021 1726   GLUCOSEU NEGATIVE 03/26/2021 1726   GLUCOSEU Negative 03/27/2014 0830   HGBUR SMALL (A) 03/26/2021 1726   BILIRUBINUR NEGATIVE 03/26/2021 1726   BILIRUBINUR Negative 03/27/2014 0830   KETONESUR 5 (A) 03/26/2021 1726   PROTEINUR NEGATIVE 03/26/2021 1726   NITRITE NEGATIVE 03/26/2021 1726   LEUKOCYTESUR NEGATIVE 03/26/2021 1726   LEUKOCYTESUR Trace 03/27/2014 0830   Sepsis Labs Invalid input(s): PROCALCITONIN,  WBC,  LACTICIDVEN Microbiology Recent Results (from the past 240 hour(s))  Resp Panel by RT-PCR (Flu A&B, Covid) Nasopharyngeal Swab     Status: None   Collection Time: 04/15/21  5:04 PM   Specimen: Nasopharyngeal Swab; Nasopharyngeal(NP) swabs in vial transport medium  Result Value Ref Range Status   SARS Coronavirus 2 by RT PCR NEGATIVE NEGATIVE Final    Comment: (NOTE) SARS-CoV-2 target nucleic  acids are NOT DETECTED.  The SARS-CoV-2 RNA is generally detectable in upper respiratory specimens during the acute phase of infection. The lowest concentration of SARS-CoV-2 viral copies this assay can detect is 138 copies/mL. A negative result does not preclude SARS-Cov-2 infection and should not be used as the sole basis for treatment or other patient management decisions. A negative result may occur with  improper specimen collection/handling, submission of specimen other than nasopharyngeal swab, presence of viral mutation(s) within the areas targeted by this assay, and inadequate number of viral copies(<138 copies/mL). A negative result must be combined with clinical observations, patient history, and epidemiological information. The expected result is Negative.  Fact Sheet for Patients:   EntrepreneurPulse.com.au  Fact Sheet for Healthcare Providers:  IncredibleEmployment.be  This test is no t yet approved or cleared by the [place] FDA and  has been authorized for detection and/or diagnosis of SARS-CoV-2 by FDA under an Emergency Use Authorization (EUA). This EUA will remain  in effect (meaning this test can be used) for the duration of the COVID-19 declaration under Section 564(b)(1) of the Act, 21 U.S.C.section 360bbb-3(b)(1), unless the authorization is terminated  or revoked sooner.       Influenza A by PCR NEGATIVE NEGATIVE Final   Influenza B by PCR NEGATIVE NEGATIVE Final    Comment: (NOTE) The Xpert Xpress SARS-CoV-2/FLU/RSV plus assay is intended as an aid in the diagnosis of influenza from Nasopharyngeal swab specimens and should not be used as a sole basis for treatment. Nasal washings and aspirates are unacceptable for Xpert Xpress SARS-CoV-2/FLU/RSV testing.  Fact Sheet for Patients: EntrepreneurPulse.com.au  Fact Sheet for Healthcare Providers: IncredibleEmployment.be  This test is not yet approved or cleared by the [place] FDA and has been authorized for detection and/or diagnosis of SARS-CoV-2 by FDA under an Emergency Use Authorization (EUA). This EUA will remain in effect (meaning this test can be used) for the duration of the COVID-19 declaration under Section 564(b)(1) of the Act, 21 U.S.C. section 360bbb-3(b)(1), unless the authorization is terminated or revoked.  Performed at [facility], [address] [place], [place] [address]    Time coordinating discharge: 40 mins   SIGNED:  Irwin Brakeman, MD  [facility] 04/18/2021, 10:02 AM How to contact the [facility] Attending or Consulting provider [place] or covering provider during after hours [place], for this patient?  1. Check the care team in [facility] and look for a) attending/consulting [facility] provider  listed and b) the [facility] team listed 2. Log into [email or web address] and use [place]'s universal password to access. If you do not have the password, please contact the hospital operator. 3. Locate the [facility] provider you are looking for under [facility] and page to a number that you can be directly reached. 4. If you still have difficulty reaching the provider, please page the [facility] (Director on Call) for the Hospitalists listed on amion for assistance.

## 2021-04-18 NOTE — Care Management Important Message (Addendum)
Important Message  Patient Details  Name: Megan Oneal MRN: [id number] Date of Birth: 1956/06/30   Medicare Important Message Given:  Yes   Given at 11:05am  Tommy Medal 04/18/2021, 12:07 PM

## 2021-04-18 NOTE — Progress Notes (Signed)
PT Cancellation Note  Patient Details Name: Megan Oneal MRN: [id number] DOB: [birth year]   Cancelled Treatment:    Reason Eval/Treat Not Completed: Other (comment) (RN stated patient discharging with homehealth today and did not need PT evaluation)    10:47 AM, 04/18/21 Mearl Latin PT, DPT Physical Therapist at [facility]

## 2021-04-18 NOTE — Clinical Social Work Note (Addendum)
Patient's sister is agreeable to [facility]. [place] referral faxed to [phone]. [facility] does not cover this part of [facility].  [facility] referral sent to [facility] via fax.    Breckin Zafar, Clydene Pugh, LCSW

## 2021-04-18 NOTE — Discharge Instructions (Signed)
Information on my medicine - ELIQUIS (apixaban)  This medication education was reviewed with me or my healthcare representative as part of my discharge preparation.    Why was Eliquis prescribed for you? Eliquis was prescribed to treat blood clots that may have been found in the veins of your legs (deep vein thrombosis) or in your lungs (pulmonary embolism) and to reduce the risk of them occurring again.  What do You need to know about Eliquis ? The starting dose is 10 mg (two 5 mg tablets) taken TWICE daily for the FIRST SEVEN (7) DAYS, then on (enter date)  04/24/2021 the dose is reduced to ONE 5 mg tablet taken TWICE daily.  Eliquis may be taken with or without food.   Try to take the dose about the same time in the morning and in the evening. If you have difficulty swallowing the tablet whole please discuss with your pharmacist how to take the medication safely.  Take Eliquis exactly as prescribed and DO NOT stop taking Eliquis without talking to the doctor who prescribed the medication.  Stopping may increase your risk of developing a new blood clot.  Refill your prescription before you run out.  After discharge, you should have regular check-up appointments with your healthcare provider that is prescribing your Eliquis.    What do you do if you miss a dose? If a dose of ELIQUIS is not taken at the scheduled time, take it as soon as possible on the same day and twice-daily administration should be resumed. The dose should not be doubled to make up for a missed dose.  Important Safety Information A possible side effect of Eliquis is bleeding. You should call your healthcare provider right away if you experience any of the following: ? Bleeding from an injury or your nose that does not stop. ? Unusual colored urine (red or dark brown) or unusual colored stools (red or black). ? Unusual bruising for unknown reasons. ? A serious fall or if you hit your head (even if there is no  bleeding).  Some medicines may interact with Eliquis and might increase your risk of bleeding or clotting while on Eliquis. To help avoid this, consult your healthcare provider or pharmacist prior to using any new prescription or non-prescription medications, including herbals, vitamins, non-steroidal anti-inflammatory drugs (NSAIDs) and supplements.  This website has more information on Eliquis (apixaban): http://www.eliquis.com/eliquis/home   IMPORTANT INFORMATION: PAY CLOSE ATTENTION   PHYSICIAN DISCHARGE INSTRUCTIONS  Follow with Primary care provider  Sharilyn Sites, MD  and other consultants as instructed by your Hospitalist Physician  [place] IF SYMPTOMS COME BACK, WORSEN OR NEW PROBLEM DEVELOPS   Please note: You were cared for by a hospitalist during your hospital stay. Every effort will be made to forward records to your primary care provider.  You can request that your primary care provider send for your hospital records if they have not received them.  Once you are discharged, your primary care physician will handle any further medical issues. Please note that NO REFILLS for any discharge medications will be authorized once you are discharged, as it is imperative that you return to your primary care physician (or establish a relationship with a primary care physician if you do not have one) for your post hospital discharge needs so that they can reassess your need for medications and monitor your lab values.  Please get a complete blood count and chemistry panel checked by your Primary MD  at your next visit, and again as instructed by your Primary MD.  Get Medicines reviewed and adjusted: Please take all your medications with you for your next visit with your Primary MD  Laboratory/radiological data: Please request your Primary MD to go over all hospital tests and procedure/radiological results at the follow up, please ask your primary care  provider to get all Hospital records sent to his/her office.  In some cases, they will be blood work, cultures and biopsy results pending at the time of your discharge. Please request that your primary care provider follow up on these results.  If you are diabetic, please bring your blood sugar readings with you to your follow up appointment with primary care.    Please call and make your follow up appointments as soon as possible.    Also Note the following: If you experience worsening of your admission symptoms, develop shortness of breath, life threatening emergency, suicidal or homicidal thoughts you must seek medical attention immediately by calling 911 or calling your MD immediately  if symptoms less severe.  You must read complete instructions/literature along with all the possible adverse reactions/side effects for all the Medicines you take and that have been prescribed to you. Take any new Medicines after you have completely understood and accpet all the possible adverse reactions/side effects.   Do not drive when taking Pain medications or sleeping medications (Benzodiazepines)  Do not take more than prescribed Pain, Sleep and Anxiety Medications. It is not advisable to combine anxiety,sleep and pain medications without talking with your primary care practitioner  Special Instructions: If you have smoked or chewed Tobacco  in the last 2 yrs please stop smoking, stop any regular Alcohol  and or any Recreational drug use.  Wear Seat belts while driving.  Do not drive if taking any narcotic, mind altering or controlled substances or recreational drugs or alcohol.

## 2021-04-18 NOTE — Plan of Care (Signed)

## 2021-04-19 DIAGNOSIS — E7849 Other hyperlipidemia: Secondary | ICD-10-CM | POA: Diagnosis not present

## 2021-04-19 DIAGNOSIS — I10 Essential (primary) hypertension: Secondary | ICD-10-CM | POA: Diagnosis not present

## 2021-04-21 ENCOUNTER — Emergency Department (HOSPITAL_COMMUNITY): Payer: Medicare HMO

## 2021-04-21 ENCOUNTER — Encounter (HOSPITAL_COMMUNITY): Payer: Self-pay | Admitting: Emergency Medicine

## 2021-04-21 ENCOUNTER — Inpatient Hospital Stay (HOSPITAL_COMMUNITY)
Admission: EM | Admit: 2021-04-21 | Discharge: 2021-04-26 | DRG: 175 | Disposition: A | Discharge status: Home Health | Payer: Medicare HMO | Attending: Family Medicine | Admitting: Family Medicine

## 2021-04-21 ENCOUNTER — Other Ambulatory Visit: Payer: Self-pay

## 2021-04-21 DIAGNOSIS — I493 Ventricular premature depolarization: Secondary | ICD-10-CM | POA: Diagnosis not present

## 2021-04-21 DIAGNOSIS — I82403 Acute embolism and thrombosis of unspecified deep veins of lower extremity, bilateral: Secondary | ICD-10-CM | POA: Diagnosis not present

## 2021-04-21 DIAGNOSIS — E876 Hypokalemia: Secondary | ICD-10-CM | POA: Diagnosis present

## 2021-04-21 DIAGNOSIS — Z6841 Body Mass Index (BMI) 40.0 and over, adult: Secondary | ICD-10-CM | POA: Diagnosis not present

## 2021-04-21 DIAGNOSIS — D6869 Other thrombophilia: Secondary | ICD-10-CM | POA: Diagnosis not present

## 2021-04-21 DIAGNOSIS — R0602 Shortness of breath: Secondary | ICD-10-CM | POA: Diagnosis not present

## 2021-04-21 DIAGNOSIS — M5136 Other intervertebral disc degeneration, lumbar region: Secondary | ICD-10-CM | POA: Diagnosis present

## 2021-04-21 DIAGNOSIS — R000 Tachycardia, unspecified: Secondary | ICD-10-CM | POA: Diagnosis present

## 2021-04-21 DIAGNOSIS — D649 Anemia, unspecified: Secondary | ICD-10-CM | POA: Diagnosis present

## 2021-04-21 DIAGNOSIS — R002 Palpitations: Secondary | ICD-10-CM | POA: Diagnosis present

## 2021-04-21 DIAGNOSIS — I2602 Saddle embolus of pulmonary artery with acute cor pulmonale: Secondary | ICD-10-CM | POA: Diagnosis not present

## 2021-04-21 DIAGNOSIS — Z8249 Family history of ischemic heart disease and other diseases of the circulatory system: Secondary | ICD-10-CM

## 2021-04-21 DIAGNOSIS — Z833 Family history of diabetes mellitus: Secondary | ICD-10-CM | POA: Diagnosis not present

## 2021-04-21 DIAGNOSIS — F29 Unspecified psychosis not due to a substance or known physiological condition: Secondary | ICD-10-CM | POA: Diagnosis present

## 2021-04-21 DIAGNOSIS — I10 Essential (primary) hypertension: Secondary | ICD-10-CM | POA: Diagnosis not present

## 2021-04-21 DIAGNOSIS — Z20822 Contact with and (suspected) exposure to covid-19: Secondary | ICD-10-CM | POA: Diagnosis not present

## 2021-04-21 DIAGNOSIS — Z87891 Personal history of nicotine dependence: Secondary | ICD-10-CM | POA: Diagnosis not present

## 2021-04-21 DIAGNOSIS — R69 Illness, unspecified: Secondary | ICD-10-CM | POA: Diagnosis not present

## 2021-04-21 DIAGNOSIS — F419 Anxiety disorder, unspecified: Secondary | ICD-10-CM | POA: Diagnosis present

## 2021-04-21 DIAGNOSIS — I517 Cardiomegaly: Secondary | ICD-10-CM | POA: Diagnosis not present

## 2021-04-21 DIAGNOSIS — I2699 Other pulmonary embolism without acute cor pulmonale: Secondary | ICD-10-CM | POA: Diagnosis present

## 2021-04-21 DIAGNOSIS — E119 Type 2 diabetes mellitus without complications: Secondary | ICD-10-CM | POA: Diagnosis not present

## 2021-04-21 DIAGNOSIS — R531 Weakness: Secondary | ICD-10-CM | POA: Diagnosis not present

## 2021-04-21 DIAGNOSIS — I2692 Saddle embolus of pulmonary artery without acute cor pulmonale: Secondary | ICD-10-CM | POA: Diagnosis not present

## 2021-04-21 DIAGNOSIS — Z79899 Other long term (current) drug therapy: Secondary | ICD-10-CM | POA: Diagnosis not present

## 2021-04-21 DIAGNOSIS — E1169 Type 2 diabetes mellitus with other specified complication: Secondary | ICD-10-CM | POA: Diagnosis not present

## 2021-04-21 LAB — BASIC METABOLIC PANEL
Anion gap: 11 (ref 5–15)
BUN: <5 mg/dL — ABNORMAL LOW (ref 8–23)
CO2: 21 mmol/L — ABNORMAL LOW (ref 22–32)
Calcium: 8.9 mg/dL (ref 8.9–10.3)
Chloride: 106 mmol/L (ref 98–111)
Creatinine, Ser: 0.91 mg/dL (ref 0.44–1.00)
GFR, Estimated: >60 mL/min (ref >60)
Glucose, Bld: 122 mg/dL — ABNORMAL HIGH (ref 70–99)
Potassium: 3.4 mmol/L — ABNORMAL LOW (ref 3.5–5.1)
Sodium: 138 mmol/L (ref 135–145)

## 2021-04-21 LAB — CBC WITH DIFFERENTIAL/PLATELET
Abs Immature Granulocytes: 0.03 10*3/uL (ref 0.00–0.07)
Basophils Absolute: 0.1 10*3/uL (ref 0.0–0.1)
Basophils Relative: 1 %
Eosinophils Absolute: 0.5 10*3/uL (ref 0.0–0.5)
Eosinophils Relative: 7 %
HCT: 42.1 % (ref 36.0–46.0)
Hemoglobin: 12.6 g/dL (ref 12.0–15.0)
Immature Granulocytes: 0 %
Lymphocytes Relative: 26 %
Lymphs Abs: 2 10*3/uL (ref 0.7–4.0)
MCH: 27.9 pg (ref 26.0–34.0)
MCHC: 29.9 g/dL — ABNORMAL LOW (ref 30.0–36.0)
MCV: 93.1 fL (ref 80.0–100.0)
Monocytes Absolute: 0.8 10*3/uL (ref 0.1–1.0)
Monocytes Relative: 10 %
Neutro Abs: 4.2 10*3/uL (ref 1.7–7.7)
Neutrophils Relative %: 56 %
Platelets: 286 10*3/uL (ref 150–400)
RBC: 4.52 MIL/uL (ref 3.87–5.11)
RDW: 16.5 % — ABNORMAL HIGH (ref 11.5–15.5)
WBC: 7.6 10*3/uL (ref 4.0–10.5)
nRBC: 0 % (ref 0.0–0.2)

## 2021-04-21 LAB — PROTIME-INR
INR: 1.7 — ABNORMAL HIGH (ref 0.8–1.2)
Prothrombin Time: 19.6 seconds — ABNORMAL HIGH (ref 11.4–15.2)

## 2021-04-21 LAB — RESP PANEL BY RT-PCR (FLU A&B, COVID) ARPGX2
Influenza A by PCR: NEGATIVE
Influenza B by PCR: NEGATIVE
SARS Coronavirus 2 by RT PCR: NEGATIVE

## 2021-04-21 LAB — APTT: aPTT: 29 seconds (ref 24–36)

## 2021-04-21 MED ORDER — DILTIAZEM HCL ER COATED BEADS 240 MG PO CP24
240.0000 mg | ORAL_CAPSULE | Freq: Every day | ORAL | Status: DC
  Administered 2021-04-21 – 2021-04-25 (×5): 240 mg via ORAL
  Filled 2021-04-21 (×4): qty 1

## 2021-04-21 MED ORDER — RISPERIDONE 1 MG PO TABS
2.0000 mg | ORAL_TABLET | Freq: Every day | ORAL | Status: DC
  Administered 2021-04-21 – 2021-04-25 (×5): 2 mg via ORAL
  Filled 2021-04-21 (×5): qty 2

## 2021-04-21 MED ORDER — PANTOPRAZOLE SODIUM 40 MG PO TBEC
40.0000 mg | DELAYED_RELEASE_TABLET | Freq: Every day | ORAL | Status: DC
  Administered 2021-04-21 – 2021-04-26 (×6): 40 mg via ORAL
  Filled 2021-04-21 (×6): qty 1

## 2021-04-21 MED ORDER — ONDANSETRON HCL 4 MG/2ML IJ SOLN: 4.0000 mg | Freq: Four times a day (QID) | INTRAMUSCULAR | Status: DC | PRN

## 2021-04-21 MED ORDER — POLYETHYLENE GLYCOL 3350 17 G PO PACK: 17.0000 g | PACK | Freq: Every day | ORAL | Status: DC | PRN

## 2021-04-21 MED ORDER — ACETAMINOPHEN 325 MG PO TABS
650.0000 mg | ORAL_TABLET | Freq: Four times a day (QID) | ORAL | Status: DC | PRN
  Administered 2021-04-25: 650 mg via ORAL
  Filled 2021-04-21: qty 2

## 2021-04-21 MED ORDER — ACETAMINOPHEN 650 MG RE SUPP: 650.0000 mg | Freq: Four times a day (QID) | RECTAL | Status: DC | PRN

## 2021-04-21 MED ORDER — WARFARIN SODIUM 5 MG PO TABS
5.0000 mg | ORAL_TABLET | Freq: Once | ORAL | Status: AC
  Administered 2021-04-21: 5 mg via ORAL
  Filled 2021-04-21: qty 1

## 2021-04-21 MED ORDER — LORATADINE 10 MG PO TABS
10.0000 mg | ORAL_TABLET | Freq: Every day | ORAL | Status: DC
  Administered 2021-04-21 – 2021-04-26 (×6): 10 mg via ORAL
  Filled 2021-04-21 (×6): qty 1

## 2021-04-21 MED ORDER — ONDANSETRON HCL 4 MG PO TABS: 4.0000 mg | ORAL_TABLET | Freq: Four times a day (QID) | ORAL | Status: DC | PRN

## 2021-04-21 MED ORDER — LEVOCETIRIZINE DIHYDROCHLORIDE 5 MG PO TABS: 5.0000 mg | ORAL_TABLET | Freq: Every day | ORAL | Status: DC

## 2021-04-21 MED ORDER — ENOXAPARIN SODIUM 150 MG/ML IJ SOSY
140.0000 mg | PREFILLED_SYRINGE | Freq: Two times a day (BID) | INTRAMUSCULAR | Status: DC
  Administered 2021-04-22 – 2021-04-26 (×9): 140 mg via SUBCUTANEOUS
  Filled 2021-04-21 (×9): qty 0.94
  Filled 2021-04-21: qty 1
  Filled 2021-04-21 (×5): qty 0.94

## 2021-04-21 MED ORDER — IOHEXOL 350 MG/ML SOLN
100.0000 mL | Freq: Once | INTRAVENOUS | Status: AC | PRN
  Administered 2021-04-21: 120 mL via INTRAVENOUS

## 2021-04-21 NOTE — H&P (Signed)
History and Physical    Megan Oneal PPI:[id number] DOB: Mar 05, 1956 DOA: 04/21/2021  PCP: Sharilyn Sites, MD   Patient coming from: Home  I have personally briefly reviewed patient's old medical records in [place]  Chief Complaint: Difficulty breathing  HPI: Megan Oneal is a 65 y.o. female with medical history significant for hypertension, obesity, diabetes mellitus, recent hospitalization for pulmonary embolism. Patient presented to the ED with complaints of worsening difficulty breathing yesterday last night.  She denies chest pain, she reports her lower extremity swelling has improved.  Patient tells me she has not missed any doses of her Eliquis since discharge.    Recent hospitalization 4/26-4/29 -saddle type PE with right heart strain, at least submassive PE.  Bilateral lower extremity venous Doppler showed extensive occlusive DVT on the right, and isolated acute occlusive DVT on the left.  Treated with IV heparin drip and fusion while admitted and discharged on Eliquis.  She did not require supplemental O2 on discharge.  ED Course: Tachycardic to 121, tachypneic to 24, blood pressure systolic 063K to 160F.  O2 sats 93 to 96% on room air.  Repeat CTA redemonstrated saddle pulmonary embolus with large burden of bilateral low back and more distal embolus bilaterally, with significantly increased burden of embolus bilaterally. EDP talked with Dr. Delton Coombes, recommended Lovenox bridging to Coumadin and she had not missed any Eliquis doses, recommended admission, and repeat echo. EDP talked to critical care Dr. Melvyn Novas, no intervention needed, okay to admit here.  Review of Systems: As per HPI all other systems reviewed and negative.  Past Medical History:  Diagnosis Date  . Anxiety 03/12/2013  . Degenerative disc disease, lumbar    Epidural injections in 2007  . Diabetes mellitus without complication ([place])   . Endometriosis   . Frequent PVCs    12,000/24 hours  . Hypertension    . Otalgia, unspecified   . Palpitations 10/04/2010  . Vascular bruit 01/2013   Right neck    Past Surgical History:  Procedure Laterality Date  . BREAST SURGERY     benign tumor excised-age 38  . CARDIOVASCULAR STRESS TEST  03/13/2008   No scintigraphic evidence of inducible myocardial ischemia, EKG negative for ischemia, no ECG changes.  . COLONOSCOPY  2009  . COLONOSCOPY N/A 09/21/2019   Procedure: COLONOSCOPY;  Surgeon: Rogene Houston, MD;  [facility];  Service: Endoscopy;  Laterality: N/A;  2:00-10:30  . CYST EXCISION  2007   Scalp  . DOPPLER ECHOCARDIOGRAPHY  03/13/2008   EF >09%, LV systolic function is normal, LV wall function is normal  . LAPAROSCOPIC CHOLECYSTECTOMY  2010  . LUMBAR LAMINECTOMY  2009    L4-L5 and L5-S1 laminectomy with diskectomy; transforaminal  . POLYPECTOMY  09/21/2019   Procedure: POLYPECTOMY;  Surgeon: Rogene Houston, MD;  Location: [facility];  Service: Endoscopy;;  . REPLACEMENT TOTAL KNEE    . SLEEP STUDY  10/29/2009   AHI-1.8/hr, during REM 6.4/hr; RDI-8.0/hr, during REM 15.0/hr; avg oxygen during REM and NREM was 94%  . TOTAL KNEE ARTHROPLASTY Bilateral   . TUBAL LIGATION       reports that she quit smoking about 24 years ago. Her smoking use included cigarettes. She started smoking about 48 years ago. She has a 40.00 pack-year smoking history. She quit smokeless tobacco use about 26 years ago. She reports that she does not drink alcohol and does not use drugs.  Allergies  Allergen Reactions  . Metoprolol Other (See Comments)  Fatigue and malaise; wheezing  . Sulfa Antibiotics     Family History  Problem Relation Age of Onset  . Diabetes Mother   . Hypertension Mother   . Diabetes Brother   . Hypertension Brother   . Hypertension Father      Prior to Admission medications   Medication Sig Start Date End Date Taking? Authorizing Provider  APIXABAN (ELIQUIS) VTE STARTER PACK (10MG  AND 5MG ) Take as directed on  package: start with two-5mg  tablets twice daily for 7 days. On day 8, switch to one-5mg  tablet twice daily. 04/18/21  Yes Johnson, Clanford L, MD  cholecalciferol (VITAMIN D3) 25 MCG (1000 UT) tablet Take 1,000 Units by mouth daily.   Yes [provider]  dexlansoprazole (DEXILANT) 60 MG capsule Take 60 mg by mouth daily.   Yes [provider]  diltiazem (CARDIZEM CD) 240 MG 24 hr capsule Take 1 capsule (240 mg total) by mouth daily. 04/18/21 05/18/21 Yes Johnson, Clanford L, MD  ferrous sulfate 325 (65 FE) MG tablet Take 325 mg by mouth daily with breakfast.    Yes [provider]  fluticasone (FLONASE) 50 MCG/ACT nasal spray Place 2 sprays into both nostrils daily as needed for allergies. 07/24/19  Yes [provider]  furosemide (LASIX) 40 MG tablet Take 40 mg by mouth every other day.    Yes [provider]  ipratropium (ATROVENT) 0.03 % nasal spray Place 2 sprays into both nostrils 3 (three) times daily as needed for rhinitis.   Yes [provider]  levocetirizine (XYZAL) 5 MG tablet Take 5 mg by mouth daily. 04/21/21  Yes [provider]  polyethylene glycol powder (GLYCOLAX/MIRALAX) powder 1 scoop daily or as needed 06/07/18  Yes Eure, Mertie Clause, MD  risperiDONE (RISPERDAL) 2 MG tablet Take 1 tablet (2 mg total) by mouth at bedtime. 04/07/21  Yes Nita Sells, MD  vitamin C (ASCORBIC ACID) 500 MG tablet Take 500 mg by mouth daily.   Yes [provider]  acetaminophen (TYLENOL) 325 MG tablet Take 2 tablets (650 mg total) by mouth every 6 (six) hours as needed for mild pain (or Fever >/= 101). 04/18/21   Murlean Iba, MD    Physical Exam: Vitals:   04/21/21 1445 04/21/21 1500 04/21/21 1515 04/21/21 1545  BP:  (!) 140/52    Pulse: (!) 110 (!) 111    Resp: 19 (!) 21 (!) 24 (!) 22  Temp:      TempSrc:      SpO2: 96% 94%    Weight:      Height:        Constitutional: NAD, calm, comfortable, hard of  hearing Vitals:   04/21/21 1445 04/21/21 1500 04/21/21 1515 04/21/21 1545  BP:  (!) 140/52    Pulse: (!) 110 (!) 111    Resp: 19 (!) 21 (!) 24 (!) 22  Temp:      TempSrc:      SpO2: 96% 94%    Weight:      Height:       Eyes: PERRL, lids and conjunctivae normal ENMT: Mucous membranes are moist.  Neck: normal, supple, no masses, no thyromegaly Respiratory: clear to auscultation bilaterally, no wheezing, no crackles. Normal respiratory effort. No accessory muscle use.  Cardiovascular: Regular rate and rhythm, no murmurs / rubs / gallops.  Wearing TED hose stockings, no extremity edema. 2+ pedal pulses.  Abdomen: no tenderness, no masses palpated. No hepatosplenomegaly. Bowel sounds positive.  Musculoskeletal: no clubbing / cyanosis.  No joint deformity upper and lower extremities. Good ROM, no contractures.  Skin: no rashes, lesions, ulcers. No induration Neurologic: No apparent cranial nerve abnormality, moving extremities spontaneously. Psychiatric: Normal judgment and insight. Alert and oriented x 3. Normal mood.   Labs on Admission: I have personally reviewed following labs and imaging studies  CBC: Recent Labs  Lab 04/15/21 1635 04/16/21 0214 04/17/21 0526 04/18/21 0405 04/21/21 0936  WBC 9.5 9.5 8.7 6.5 7.6  NEUTROABS 4.8 3.7  --   --  4.2  HGB 12.7 11.3* 10.9* 10.8* 12.6  HCT 41.7 37.0 35.2* 34.8* 42.1  MCV 90.8 90.2 90.5 91.1 93.1  PLT 198 208 244 244 829   Basic Metabolic Panel: Recent Labs  Lab 04/15/21 1635 04/16/21 0214 04/17/21 0526 04/21/21 0936  NA 136 137 138 138  K 4.3 4.1 3.7 3.4*  CL 101 105 105 106  CO2 20* 22 24 21*  GLUCOSE 121* 118* 123* 122*  BUN 12 10 7* <5*  CREATININE 1.27* 1.09* 0.99 0.91  CALCIUM 9.1 8.7* 8.5* 8.9  MG  --  1.8 1.7  --    GFR: Estimated Creatinine Clearance: 89.9 mL/min (by C-G formula based on SCr of 0.91 mg/dL). Liver Function Tests: Recent Labs  Lab 04/15/21 1635 04/16/21 0214  AST 49* 38  ALT 135* 106*   ALKPHOS 64 57  BILITOT 0.6 0.8  PROT 7.2 6.5  ALBUMIN 3.6 3.2*   Coagulation Profile: Recent Labs  Lab 04/15/21 1635 04/21/21 1726  INR 1.2 1.7*    Radiological Exams on Admission: CT Angio Chest PE W/Cm &/Or Wo Cm  Result Date: 04/21/2021 CLINICAL DATA:  Recent diagnosis pulmonary embolism, currently anticoagulated, worsening shortness of breath EXAM: CT ANGIOGRAPHY CHEST WITH CONTRAST TECHNIQUE: Multidetector CT imaging of the chest was performed using the standard protocol during bolus administration of intravenous contrast. Multiplanar CT image reconstructions and MIPs were obtained to evaluate the vascular anatomy. CONTRAST:  117mL OMNIPAQUE IOHEXOL 350 MG/ML SOLN COMPARISON:  04/15/2021 FINDINGS: Cardiovascular: Satisfactory opacification of the pulmonary arteries to the segmental level. Redemonstrated saddle embolus with a large burden of bilateral lobar and more distal embolus. There is a significantly increased burden of embolus bilaterally, although particularly in the left upper lobe. Global cardiomegaly, with gross enlargement of the RV LV ratio, greater than 1.6-1. No pericardial effusion. The main pulmonary artery is enlarged, measuring up to 3.8 cm in caliber. Mediastinum/Nodes: No enlarged mediastinal, hilar, or axillary lymph nodes. Thyroid gland, trachea, and esophagus demonstrate no significant findings. Lungs/Pleura: Lungs are clear. No pleural effusion or pneumothorax. Upper Abdomen: No acute abnormality. Musculoskeletal: No chest wall abnormality. No acute or significant osseous findings. Review of the MIP images confirms the above findings. IMPRESSION: 1. Redemonstrated saddle pulmonary embolus with a large burden of bilateral lobar and more distal embolus throughout all lobes of the lungs. There is a significantly increased burden of embolus bilaterally, although particularly in the left upper lobe. 2. Global cardiomegaly, with gross enlargement of the RV LV ratio, greater  than 1.6-1. The main pulmonary artery is enlarged, measuring up to 3.8 cm in caliber. Findings are similar to prior examination and concerning for right heart strain. Electronically Signed   By: Eddie Candle M.D.   On: 04/21/2021 15:37   DG Chest Port 1 View  Result Date: 04/21/2021 CLINICAL DATA:  Shortness of breath EXAM: PORTABLE CHEST 1 VIEW COMPARISON:  04/15/2021 FINDINGS: Cardiac shadow is enlarged but somewhat accentuated by the portable technique. The lungs are clear bilaterally. No  focal infiltrate is seen. Mild central vascular congestion is noted without edema. IMPRESSION: Mild central vascular congestion without parenchymal edema. No other focal abnormality is noted. Electronically Signed   By: Inez Catalina M.D.   On: 04/21/2021 09:30    EKG: Independently reviewed.  Ectopic atrial tachycardia rate 117.  Rhythm regular.  QTc 447.  Assessment/Plan Principal Problem:   Pulmonary embolus (HCC) Active Problems:   Hypertension   Diabetes ([place])   Saddle pulmonary embolism-, tachycardic, tachypneic, but stable blood pressure and maintaining O2 sats on room air.  CT chest again revealed saddle pulmonary embolism, but significantly increased burden of clots.  Did not miss any Eliquis doses. - EDP talked with Dr. Delton Coombes, recommended IV Lovenox with bridge to Coumadin, repeat echo per critical care Dr. Marlene Lard, no intervention needed, okay to admit here. -Limited echo ordered  Hypertension- Stable -Resume Cardizem -Every other day Lasix for now  Diabetes-random glucose 122. Hgba1c 6.4. Not on medications - SSIs  Psychosis, auditory hallucinations - Resume risperidone  DVT prophylaxis: Lovenox Code Status: Full code Family Communication: None at bedside Disposition Plan:  ~ 2 days Consults called: None Admission status: Inpt, stepdown I certify that at the point of admission it is my clinical judgment that the patient will require inpatient hospital care spanning beyond 2  midnights from the point of admission due to high intensity of service, high risk for further deterioration and high frequency of surveillance required.    Bethena Roys MD [facility]  04/21/2021, 8:10 PM

## 2021-04-21 NOTE — ED Provider Notes (Addendum)
[facility] Provider Note   CSN: [id number] Arrival date & time: 04/21/21  0750     History Chief Complaint  Patient presents with  . Shortness of Breath    Megan Oneal is a 65 y.o. female.  Patient status post admission April 26 through April 29 for significant pulmonary embolus.  Patient was treated with heparin and then switched over to Eliquis.  Patient here today because changes have increased shortness of breath particular with exertion.  No chest pain.  Oxygen saturations here 93% on room air.  Patient is concerned that the blood clots have gotten worse.        Past Medical History:  Diagnosis Date  . Anxiety 03/12/2013  . Degenerative disc disease, lumbar    Epidural injections in 2007  . Diabetes mellitus without complication ([place])   . Endometriosis   . Frequent PVCs    12,000/24 hours  . Hypertension   . Otalgia, unspecified   . Palpitations 10/04/2010  . Vascular bruit 01/2013   Right neck    Patient Active Problem List   Diagnosis Date Noted  . Pulmonary embolus ([place]) 04/15/2021  . AKI (acute kidney injury) ([place]) 04/15/2021  . Transaminitis 04/15/2021  . Psychosis ([place]) 04/02/2021  . Acute encephalopathy 03/26/2021  . Auditory hallucinations 03/24/2021  . Otosclerosis 03/24/2021  . Encounter for screening colonoscopy 02/15/2019  . Obesity 03/12/2013  . Anxiety 03/12/2013  . Laboratory test 03/12/2013  . Frequent PVCs   . Degenerative disc disease, lumbar   . Hypertension   . Endometriosis   . Vascular bruit 01/21/2013  . Palpitations 10/04/2010    Past Surgical History:  Procedure Laterality Date  . BREAST SURGERY     benign tumor excised-age 5  . CARDIOVASCULAR STRESS TEST  03/13/2008   No scintigraphic evidence of inducible myocardial ischemia, EKG negative for ischemia, no ECG changes.  . COLONOSCOPY  2009  . COLONOSCOPY N/A 09/21/2019   Procedure: COLONOSCOPY;  Surgeon: Rogene Houston, MD;  [facility];   Service: Endoscopy;  Laterality: N/A;  2:00-10:30  . CYST EXCISION  2007   Scalp  . DOPPLER ECHOCARDIOGRAPHY  03/13/2008   EF >16%, LV systolic function is normal, LV wall function is normal  . LAPAROSCOPIC CHOLECYSTECTOMY  2010  . LUMBAR LAMINECTOMY  2009    L4-L5 and L5-S1 laminectomy with diskectomy; transforaminal  . POLYPECTOMY  09/21/2019   Procedure: POLYPECTOMY;  Surgeon: Rogene Houston, MD;  [facility];  Service: Endoscopy;;  . REPLACEMENT TOTAL KNEE    . SLEEP STUDY  10/29/2009   AHI-1.8/hr, during REM 6.4/hr; RDI-8.0/hr, during REM 15.0/hr; avg oxygen during REM and NREM was 94%  . TOTAL KNEE ARTHROPLASTY Bilateral   . TUBAL LIGATION       OB History    Gravida  3   Para  3   Term      Preterm      AB      Living  3     SAB      IAB      Ectopic      Multiple      Live Births              Family History  Problem Relation Age of Onset  . Diabetes Mother   . Hypertension Mother   . Diabetes Brother   . Hypertension Brother   . Hypertension Father     Social History   Tobacco Use  .  Smoking status: Former Smoker    Packs/day: 2.00    Years: 20.00    Pack years: 40.00    Types: Cigarettes    Start date: 08/03/1972    Quit date: 08/03/1996    Years since quitting: 24.7  . Smokeless tobacco: Former Systems developer    Quit date: 12/21/1994  Substance Use Topics  . Alcohol use: No  . Drug use: No    Home Medications Prior to Admission medications   Medication Sig Start Date End Date Taking? Authorizing Provider  APIXABAN (ELIQUIS) VTE STARTER PACK (10MG  AND 5MG ) Take as directed on package: start with two-5mg  tablets twice daily for 7 days. On day 8, switch to one-5mg  tablet twice daily. 04/18/21  Yes Johnson, Clanford L, MD  cholecalciferol (VITAMIN D3) 25 MCG (1000 UT) tablet Take 1,000 Units by mouth daily.   Yes [provider]  dexlansoprazole (DEXILANT) 60 MG capsule Take 60 mg by mouth daily.   Yes [provider]   diltiazem (CARDIZEM CD) 240 MG 24 hr capsule Take 1 capsule (240 mg total) by mouth daily. 04/18/21 05/18/21 Yes Johnson, Clanford L, MD  ferrous sulfate 325 (65 FE) MG tablet Take 325 mg by mouth daily with breakfast.    Yes [provider]  fluticasone (FLONASE) 50 MCG/ACT nasal spray Place 2 sprays into both nostrils daily as needed for allergies. 07/24/19  Yes [provider]  furosemide (LASIX) 40 MG tablet Take 40 mg by mouth every other day.    Yes [provider]  ipratropium (ATROVENT) 0.03 % nasal spray Place 2 sprays into both nostrils 3 (three) times daily as needed for rhinitis.   Yes [provider]  levocetirizine (XYZAL) 5 MG tablet Take 5 mg by mouth daily. 04/21/21  Yes [provider]  polyethylene glycol powder (GLYCOLAX/MIRALAX) powder 1 scoop daily or as needed 06/07/18  Yes Eure, Mertie Clause, MD  risperiDONE (RISPERDAL) 2 MG tablet Take 1 tablet (2 mg total) by mouth at bedtime. 04/07/21  Yes Nita Sells, MD  vitamin C (ASCORBIC ACID) 500 MG tablet Take 500 mg by mouth daily.   Yes [provider]  acetaminophen (TYLENOL) 325 MG tablet Take 2 tablets (650 mg total) by mouth every 6 (six) hours as needed for mild pain (or Fever >/= 101). 04/18/21   Johnson, Clanford L, MD    Allergies    Metoprolol and Sulfa antibiotics  Review of Systems   Review of Systems  Constitutional: Negative for chills and fever.  HENT: Negative for rhinorrhea and sore throat.   Eyes: Negative for visual disturbance.  Respiratory: Positive for shortness of breath. Negative for cough.   Cardiovascular: Negative for chest pain and leg swelling.  Gastrointestinal: Negative for abdominal pain, diarrhea, nausea and vomiting.  Genitourinary: Negative for dysuria.  Musculoskeletal: Negative for back pain and neck pain.  Skin: Negative for rash.  Neurological: Negative for dizziness, light-headedness and headaches.  Hematological: Does not  bruise/bleed easily.  Psychiatric/Behavioral: Negative for confusion.    Physical Exam Updated Vital Signs BP (!) 140/52   Pulse (!) 111   Temp 98.4 F (36.9 C) (Oral)   Resp (!) 22   Ht 1.676 m (5\' 6" )   Wt 136.1 kg   SpO2 94%   BMI 48.42 kg/m   Physical Exam Vitals and nursing note reviewed.  Constitutional:      General: She is not in acute distress.    Appearance: She is well-developed.  HENT:     Head:  Normocephalic and atraumatic.  Eyes:     Conjunctiva/sclera: Conjunctivae normal.  Cardiovascular:     Rate and Rhythm: Regular rhythm. Tachycardia present.     Heart sounds: No murmur heard.   Pulmonary:     Effort: Pulmonary effort is normal. No respiratory distress.     Breath sounds: Normal breath sounds. No wheezing, rhonchi or rales.  Abdominal:     Palpations: Abdomen is soft.     Tenderness: There is no abdominal tenderness.  Musculoskeletal:        General: Normal range of motion.     Cervical back: Neck supple.  Skin:    General: Skin is warm and dry.  Neurological:     General: No focal deficit present.     Mental Status: She is alert and oriented to person, place, and time.     Cranial Nerves: No cranial nerve deficit.     Sensory: No sensory deficit.     ED Results / Procedures / Treatments   Labs (all labs ordered are listed, but only abnormal results are displayed) Labs Reviewed  CBC WITH DIFFERENTIAL/PLATELET - Abnormal; Notable for the following components:      Result Value   MCHC 29.9 (*)    RDW 16.5 (*)    All other components within normal limits  BASIC METABOLIC PANEL - Abnormal; Notable for the following components:   Potassium 3.4 (*)    CO2 21 (*)    Glucose, Bld 122 (*)    BUN <5 (*)    All other components within normal limits    EKG None  Radiology CT Angio Chest PE W/Cm &/Or Wo Cm  Result Date: 04/21/2021 CLINICAL DATA:  Recent diagnosis pulmonary embolism, currently anticoagulated, worsening shortness of breath  EXAM: CT ANGIOGRAPHY CHEST WITH CONTRAST TECHNIQUE: Multidetector CT imaging of the chest was performed using the standard protocol during bolus administration of intravenous contrast. Multiplanar CT image reconstructions and MIPs were obtained to evaluate the vascular anatomy. CONTRAST:  143mL OMNIPAQUE IOHEXOL 350 MG/ML SOLN COMPARISON:  04/15/2021 FINDINGS: Cardiovascular: Satisfactory opacification of the pulmonary arteries to the segmental level. Redemonstrated saddle embolus with a large burden of bilateral lobar and more distal embolus. There is a significantly increased burden of embolus bilaterally, although particularly in the left upper lobe. Global cardiomegaly, with gross enlargement of the RV LV ratio, greater than 1.6-1. No pericardial effusion. The main pulmonary artery is enlarged, measuring up to 3.8 cm in caliber. Mediastinum/Nodes: No enlarged mediastinal, hilar, or axillary lymph nodes. Thyroid gland, trachea, and esophagus demonstrate no significant findings. Lungs/Pleura: Lungs are clear. No pleural effusion or pneumothorax. Upper Abdomen: No acute abnormality. Musculoskeletal: No chest wall abnormality. No acute or significant osseous findings. Review of the MIP images confirms the above findings. IMPRESSION: 1. Redemonstrated saddle pulmonary embolus with a large burden of bilateral lobar and more distal embolus throughout all lobes of the lungs. There is a significantly increased burden of embolus bilaterally, although particularly in the left upper lobe. 2. Global cardiomegaly, with gross enlargement of the RV LV ratio, greater than 1.6-1. The main pulmonary artery is enlarged, measuring up to 3.8 cm in caliber. Findings are similar to prior examination and concerning for right heart strain. Electronically Signed   By: Eddie Candle M.D.   On: 04/21/2021 15:37   DG Chest Port 1 View  Result Date: 04/21/2021 CLINICAL DATA:  Shortness of breath EXAM: PORTABLE CHEST 1 VIEW COMPARISON:   04/15/2021 FINDINGS: Cardiac shadow is enlarged but somewhat accentuated  by the portable technique. The lungs are clear bilaterally. No focal infiltrate is seen. Mild central vascular congestion is noted without edema. IMPRESSION: Mild central vascular congestion without parenchymal edema. No other focal abnormality is noted. Electronically Signed   By: Inez Catalina M.D.   On: 04/21/2021 09:30    Procedures Procedures   Medications Ordered in ED Medications  iohexol (OMNIPAQUE) 350 MG/ML injection 100 mL (120 mLs Intravenous Contrast Given 04/21/21 1323)    ED Course  I have reviewed the triage vital signs and the nursing notes.  Pertinent labs & imaging results that were available during my care of the patient were reviewed by me and considered in my medical decision making (see chart for details).    MDM Rules/Calculators/A&P                          Concern would be for worsening clot Hello patient states she is taking her Eliquis.  If she does have worsening clot that may require filter placement.  We will going get CT angio chest.  Patient's lab work sufficient to do that.  Patient not particular hypoxic.  May be a little bit of tachycardia.  Chest x-ray without any acute findings.  There were delays to get the CT angio because she had difficulty with IV access.  Eventually had ultrasound placed IV.  Patient went for CT scan.  Patient turned over to evening physician Dr. Laverta Baltimore who will follow-up on the CT results.  If no significant change patient can be discharged.  If it does show significant and crease in clot burden and then consultation we required an may require readmission.  Patient is not hypotensive.  Patient is nontoxic no acute distress.   Final Clinical Impression(s) / ED Diagnoses Final diagnoses:  Shortness of breath    Rx / DC Orders ED Discharge Orders    None       Fredia Sorrow, MD 04/21/21 [id number]    Fredia Sorrow, MD 04/21/21 [phone]

## 2021-04-21 NOTE — Progress Notes (Signed)
ANTICOAGULATION CONSULT NOTE - Initial Consult  Pharmacy Consult for Lovenox and Warfarin (failed Eliquis) Indication: pulmonary embolus  Allergies  Allergen Reactions  . Metoprolol Other (See Comments)    Fatigue and malaise; wheezing  . Sulfa Antibiotics    Patient Measurements: Height: 5\' 6"  (167.6 cm) Weight: 136.1 kg (300 lb) IBW/kg (Calculated) : 59.3  Vital Signs: Temp: 98.4 F (36.9 C) (05/02 0820) Temp Source: Oral (05/02 0820) BP: 140/52 (05/02 1500) Pulse Rate: 111 (05/02 1500)  Labs: Recent Labs    04/21/21 0936  HGB 12.6  HCT 42.1  PLT 286  CREATININE 0.91    Estimated Creatinine Clearance: 89.9 mL/min (by C-G formula based on SCr of 0.91 mg/dL).   Medical History: Past Medical History:  Diagnosis Date  . Anxiety 03/12/2013  . Degenerative disc disease, lumbar    Epidural injections in 2007  . Diabetes mellitus without complication ([place])   . Endometriosis   . Frequent PVCs    12,000/24 hours  . Hypertension   . Otalgia, unspecified   . Palpitations 10/04/2010  . Vascular bruit 01/2013   Right neck    Medications:  (Not in a hospital admission)   Assessment: Last dose of Eliquis was reported at today at 0730, Baseline labs pending Per MD, She has not missed doses of Eliquis and so oncologist would recommend Lovenox bridging to Coumadin.  Given her significantly increased clot burden and subjectively worsening symptoms feel she would benefit from admit to start this process and consider repeat echo.   Goal of Therapy:  INR 2-3 Monitor platelets by anticoagulation protocol: Yes   Plan:  Lovenox 1mg /Kg (140mg ) SQ q12hrs starting today at 6pm Warfarin 5mg  po x 1 today at 6pm Monitor INR and CBC daily  Hart Robinsons A 04/21/2021,4:52 PM

## 2021-04-21 NOTE — Progress Notes (Signed)
Pts IV extravasated during exam, estimated 30 ml in site, examined by Dr. Thornton Papas, orders entered. RN informed, IV removed. Scan unable to be performed at this time.

## 2021-04-21 NOTE — ED Triage Notes (Signed)
Pt recently discharged recently from hospital and states she started to feel sob yesterday evening, worsening with ambulation. Pt recently started on blood thinners for PE.

## 2021-04-21 NOTE — ED Provider Notes (Signed)
Blood pressure (!) 140/52, pulse (!) 111, temperature 98.4 F (36.9 C), temperature source Oral, resp. rate (!) 22, height 5\' 6"  (1.676 m), weight 136.1 kg, SpO2 94 %.  Assuming care from Dr. Rogene Houston.  In short, Megan Oneal is a 65 y.o. female with a chief complaint of Shortness of Breath .  Refer to the original H&P for additional details.  The current plan of care is to f/u on CTA PE. Patient with worsening SOB. Known large PE on anticoagulation.   04:00 PM  CTA of the chest shows saddle PE with significantly increased burden of embolus bilaterally.  RV/LV ratio similar to prior exam.   04:33 PM  Spoke with Dr. Delton Coombes.  Reviewed the case.  She has not missed doses of Eliquis and so he would recommend Lovenox bridging to Coumadin.  Given her significantly increased clot burden and subjectively worsening symptoms feel she would benefit from admit to start this process and consider repeat echo.   Discussed patient's case with [facility] to request admission. Patient and family (if present) updated with plan. Care transferred to [facility] service.  I reviewed all nursing notes, vitals, pertinent old records, EKGs, labs, imaging (as available).     Margette Fast, MD 04/22/21 1036

## 2021-04-21 NOTE — ED Notes (Signed)
Pt is a hard stick 

## 2021-04-21 NOTE — ED Notes (Signed)
Pt returned from CT unable to get CT done due to IV blew during contrast per CT staff. SWOT nurse called to get another IV.

## 2021-04-22 ENCOUNTER — Inpatient Hospital Stay (HOSPITAL_COMMUNITY): Payer: Medicare HMO

## 2021-04-22 DIAGNOSIS — I2692 Saddle embolus of pulmonary artery without acute cor pulmonale: Secondary | ICD-10-CM | POA: Diagnosis not present

## 2021-04-22 DIAGNOSIS — I2602 Saddle embolus of pulmonary artery with acute cor pulmonale: Secondary | ICD-10-CM | POA: Diagnosis not present

## 2021-04-22 DIAGNOSIS — D6869 Other thrombophilia: Secondary | ICD-10-CM | POA: Diagnosis not present

## 2021-04-22 DIAGNOSIS — I10 Essential (primary) hypertension: Secondary | ICD-10-CM

## 2021-04-22 LAB — BASIC METABOLIC PANEL WITH GFR
Anion gap: 9 (ref 5–15)
BUN: <5 mg/dL — ABNORMAL LOW (ref 8–23)
CO2: 23 mmol/L (ref 22–32)
Calcium: 8.5 mg/dL — ABNORMAL LOW (ref 8.9–10.3)
Chloride: 107 mmol/L (ref 98–111)
Creatinine, Ser: 0.96 mg/dL (ref 0.44–1.00)
GFR, Estimated: >60 mL/min
Glucose, Bld: 106 mg/dL — ABNORMAL HIGH (ref 70–99)
Potassium: 3.3 mmol/L — ABNORMAL LOW (ref 3.5–5.1)
Sodium: 139 mmol/L (ref 135–145)

## 2021-04-22 LAB — CBC
HCT: 36.2 % (ref 36.0–46.0)
Hemoglobin: 11.2 g/dL — ABNORMAL LOW (ref 12.0–15.0)
MCH: 28 pg (ref 26.0–34.0)
MCHC: 30.9 g/dL (ref 30.0–36.0)
MCV: 90.5 fL (ref 80.0–100.0)
Platelets: 291 10*3/uL (ref 150–400)
RBC: 4 MIL/uL (ref 3.87–5.11)
RDW: 16.5 % — ABNORMAL HIGH (ref 11.5–15.5)
WBC: 6.8 10*3/uL (ref 4.0–10.5)
nRBC: 0 % (ref 0.0–0.2)

## 2021-04-22 LAB — ECHOCARDIOGRAM LIMITED
Calc EF: 61 %
Height: 66 in
S' Lateral: 2.46 cm
Single Plane A2C EF: 61 %
Single Plane A4C EF: 57.6 %
Weight: 4737.24 oz

## 2021-04-22 LAB — PROTIME-INR
INR: 1.5 — ABNORMAL HIGH (ref 0.8–1.2)
Prothrombin Time: 18.1 s — ABNORMAL HIGH (ref 11.4–15.2)

## 2021-04-22 MED ORDER — CHLORHEXIDINE GLUCONATE CLOTH 2 % EX PADS
6.0000 | MEDICATED_PAD | Freq: Every day | CUTANEOUS | Status: DC
  Administered 2021-04-22 – 2021-04-25 (×4): 6 via TOPICAL

## 2021-04-22 MED ORDER — WARFARIN SODIUM 7.5 MG PO TABS
7.5000 mg | ORAL_TABLET | Freq: Once | ORAL | Status: AC
  Administered 2021-04-22: 7.5 mg via ORAL
  Filled 2021-04-22: qty 1

## 2021-04-22 MED ORDER — POTASSIUM CHLORIDE CRYS ER 20 MEQ PO TBCR
60.0000 meq | EXTENDED_RELEASE_TABLET | Freq: Once | ORAL | Status: AC
  Administered 2021-04-22: 60 meq via ORAL
  Filled 2021-04-22: qty 3

## 2021-04-22 MED ORDER — WARFARIN - PHARMACIST DOSING INPATIENT
Freq: Every day | Status: DC
  Administered 2021-04-24: 1

## 2021-04-22 NOTE — Progress Notes (Signed)
ANTICOAGULATION CONSULT NOTE -   Pharmacy Consult for Lovenox and Warfarin (failed Eliquis) Indication: pulmonary embolus  Allergies  Allergen Reactions  . Metoprolol Other (See Comments)    Fatigue and malaise; wheezing  . Sulfa Antibiotics    Patient Measurements: Height: 5\' 6"  (167.6 cm) Weight: 134.3 kg (296 lb 1.2 oz) IBW/kg (Calculated) : 59.3  Vital Signs: Temp: 97.9 F (36.6 C) (05/03 0759) Temp Source: Oral (05/03 0759) BP: 121/56 (05/03 0800) Pulse Rate: 109 (05/03 0800)  Labs: Recent Labs    04/21/21 0936 04/21/21 1726 04/22/21 0533  HGB 12.6  --  11.2*  HCT 42.1  --  36.2  PLT 286  --  291  APTT  --  29  --   LABPROT  --  19.6* 18.1*  INR  --  1.7* 1.5*  CREATININE 0.91  --  0.96    Estimated Creatinine Clearance: 84.6 mL/min (by C-G formula based on SCr of 0.96 mg/dL).   Medical History: Past Medical History:  Diagnosis Date  . Anxiety 03/12/2013  . Degenerative disc disease, lumbar    Epidural injections in 2007  . Diabetes mellitus without complication ([place])   . Endometriosis   . Frequent PVCs    12,000/24 hours  . Hypertension   . Otalgia, unspecified   . Palpitations 10/04/2010  . Vascular bruit 01/2013   Right neck    Medications:  Medications Prior to Admission  Medication Sig Dispense Refill Last Dose  . APIXABAN (ELIQUIS) VTE STARTER PACK (10MG  AND 5MG ) Take as directed on package: start with two-5mg  tablets twice daily for 7 days. On day 8, switch to one-5mg  tablet twice daily. 1 each 0 04/21/2021 at 0730  . cholecalciferol (VITAMIN D3) 25 MCG (1000 UT) tablet Take 1,000 Units by mouth daily.   04/20/2021 at Unknown time  . dexlansoprazole (DEXILANT) 60 MG capsule Take 60 mg by mouth daily.   04/20/2021 at Unknown time  . diltiazem (CARDIZEM CD) 240 MG 24 hr capsule Take 1 capsule (240 mg total) by mouth daily. 30 capsule 2 04/20/2021 at Unknown time  . ferrous sulfate 325 (65 FE) MG tablet Take 325 mg by mouth daily with breakfast.     04/20/2021 at Unknown time  . fluticasone (FLONASE) 50 MCG/ACT nasal spray Place 2 sprays into both nostrils daily as needed for allergies.     . furosemide (LASIX) 40 MG tablet Take 40 mg by mouth every other day.    04/20/2021 at Unknown time  . ipratropium (ATROVENT) 0.03 % nasal spray Place 2 sprays into both nostrils 3 (three) times daily as needed for rhinitis.   04/20/2021 at Unknown time  . levocetirizine (XYZAL) 5 MG tablet Take 5 mg by mouth daily.   04/20/2021 at Unknown time  . polyethylene glycol powder (GLYCOLAX/MIRALAX) powder 1 scoop daily or as needed 255 g 11   . risperiDONE (RISPERDAL) 2 MG tablet Take 1 tablet (2 mg total) by mouth at bedtime. 10 tablet 0 04/20/2021 at Unknown time  . vitamin C (ASCORBIC ACID) 500 MG tablet Take 500 mg by mouth daily.   04/20/2021 at Unknown time  . acetaminophen (TYLENOL) 325 MG tablet Take 2 tablets (650 mg total) by mouth every 6 (six) hours as needed for mild pain (or Fever >/= 101).       Assessment: Last dose of Eliquis was reported 5/3  at 0730, Baseline labs pending Per MD, She has not missed doses of Eliquis and so oncologist would recommend Lovenox bridging to  Coumadin.  Given her significantly increased clot burden and subjectively worsening symptoms feel she would benefit from admit to start this process and consider repeat echo.   INR 1.5- subtherapeutic  Goal of Therapy:  INR 2-3 Monitor platelets by anticoagulation protocol: Yes   Plan:  Lovenox 1mg /Kg (140mg ) SQ q12hrs starting today at 6pm Warfarin 7.5mg  po x 1 today at 6pm Monitor INR and CBC daily  Margot Ables, PharmD Clinical Pharmacist 04/22/2021 8:58 AM

## 2021-04-22 NOTE — Progress Notes (Signed)
  Echocardiogram 2D Echocardiogram has been performed.  Megan Oneal 04/22/2021, 2:13 PM

## 2021-04-22 NOTE — Progress Notes (Addendum)
PROGRESS NOTE   Megan Oneal  WCH:[id number] DOB: May 10, 1956 DOA: 04/21/2021 PCP: Megan Found, MD   Chief Complaint  Patient presents with  . Shortness of Breath   Level of care: Stepdown  Brief Admission History:  66 y.o. female with medical history significant for hypertension, obesity, diabetes mellitus, recent hospitalization for pulmonary embolism.  Patient presented to the ED with complaints of worsening difficulty breathing yesterday last night.  She denies chest pain, she reports her lower extremity swelling has improved.  Patient tells me she has not missed any doses of her Eliquis since discharge.  She started having a recurrence of chest discomfort and shortness of breath about 3-4 days after discharge.   Pt discharged by me on 4/29 on apixaban starter pack, had started apixaban in hospital after 30 hours of IV heparin infusion working closely with pulmonologist. Pt reported she did not miss any doses of apixaban since being at home.    She presented Tachycardic to 121, tachypneic to 24, blood pressure systolic 120s to 353I.  O2 sats 93 to 96% on room air.  Repeat CTA redemonstrated saddle pulmonary embolus with large burden of bilateral low back and more distal embolus bilaterally, with significantly increased burden of embolus bilaterally.  EDP talked with Dr. Ellin Oneal, recommended Lovenox bridging to Coumadin and she had not missed any Eliquis doses, recommended admission, and repeat echo. EDP talked to critical care Dr. Sherene Oneal, no intervention needed, okay to admit here.  This is considered an apixaban failure.  I do wonder if this patient will need a hypercoag work up.  She definitely will need hematology outpatient follow up. Pharm D consulted for enoxaparin bridge and warfarin initiation to therapeutic goal.   Assessment & Plan:   Principal Problem:   Pulmonary embolus (HCC) Active Problems:   Hypertension   Diabetes (HCC)   1. Acute saddle pulmonary embolus and  bilateral occlusive DVT - She was initially treated with IV heparin and transitioned to oral apixaban. After being home symptoms of dyspnea returned and she presented back with larger clot burden.  She remains on room air at this time.  A limited 2D echo is ordered to assess for right heart strain which she did have when she was here last week and pulmonologist said that it would take several weeks to resolve.  This case was discussed with pulmonologist Dr. Sherene Oneal and hematologist Dr. Ellin Oneal for management recommendations. At this time continue enoxaparin with plan to bridge to therapeutic warfarin.  Pharm D consulted to educate patient and manage warfarin dosing.  Ted hose to BLE to try to prevent post thrombotic phlebitis.   2. Sinus tachycardia - in setting of acute PE.  She is on cardizem for BP.  3. History of recent hospitalization for auditory hallucinations - she has been stable on risperdal which has been resumed.  4. Normocytic anemia - follow Hg closely.  Watch for bleeding on full anticoagulation therapy.  5. Hypokalemia - oral replacement given, recheck in AM.  6. Prediabetes - as evidenced by A1c of 6.4% - diet controlled.  7. Obesity - consult to dietitian.   DVT prophylaxis: enoxaparin bridge to warfarin Code Status: full  Family Communication: plan of care discussed with patient at bedside, who verbalized understanding Disposition: home  Status is: Inpatient  Remains inpatient appropriate because:IV treatments appropriate due to intensity of illness or inability to take PO and Inpatient level of care appropriate due to severity of illness  Dispo: The patient is from: Home  Anticipated d/c is to: Home              Patient currently is not medically stable to d/c.   Difficult to place patient No  Consultants:   Hematologist Dr. Delton Oneal  Procedures:   Limited repeat echo pending   Antimicrobials:    Subjective: Pt reports shortness of breath and  palpitations.   Objective: Vitals:   04/22/21 1000 04/22/21 1125 04/22/21 1200 04/22/21 1300  BP: 129/68  139/71 126/80  Pulse: (!) 108 96 (!) 105 (!) 109  Resp: (!) 22 17 18 20   Temp:  97.9 F (36.6 C)    TempSrc:  Oral    SpO2: 97% 97% 97% 96%  Weight:      Height:        Intake/Output Summary (Last 24 hours) at 04/22/2021 1404 Last data filed at 04/22/2021 1354 Gross per 24 hour  Intake 360 ml  Output --  Net 360 ml   Filed Weights   04/21/21 0817 04/21/21 1841  Weight: 136.1 kg 134.3 kg    Examination:  General exam: Appears calm and comfortable  Respiratory system: Clear to auscultation. Respiratory effort normal. Cardiovascular system: tachycardic rate normal S1 & S2 heard. No JVD, murmurs, rubs, gallops or clicks. No pedal edema. Gastrointestinal system: Abdomen is nondistended, soft and nontender. No organomegaly or masses felt. Normal bowel sounds heard. Central nervous system: Alert and oriented. No focal neurological deficits. Extremities: Symmetric 5 x 5 power. Skin: No rashes, lesions or ulcers.  Psychiatry: Judgement and insight appear normal. Mood & affect appropriate.   Data Reviewed: I have personally reviewed following labs and imaging studies  CBC: Recent Labs  Lab 04/15/21 1635 04/16/21 0214 04/17/21 0526 04/18/21 0405 04/21/21 0936 04/22/21 0533  WBC 9.5 9.5 8.7 6.5 7.6 6.8  NEUTROABS 4.8 3.7  --   --  4.2  --   HGB 12.7 11.3* 10.9* 10.8* 12.6 11.2*  HCT 41.7 37.0 35.2* 34.8* 42.1 36.2  MCV 90.8 90.2 90.5 91.1 93.1 90.5  PLT 198 208 244 244 286 725    Basic Metabolic Panel: Recent Labs  Lab 04/15/21 1635 04/16/21 0214 04/17/21 0526 04/21/21 0936 04/22/21 0533  NA 136 137 138 138 139  K 4.3 4.1 3.7 3.4* 3.3*  CL 101 105 105 106 107  CO2 20* 22 24 21* 23  GLUCOSE 121* 118* 123* 122* 106*  BUN 12 10 7* <5* <5*  CREATININE 1.27* 1.09* 0.99 0.91 0.96  CALCIUM 9.1 8.7* 8.5* 8.9 8.5*  MG  --  1.8 1.7  --   --     GFR: Estimated  Creatinine Clearance: 84.6 mL/min (by C-G formula based on SCr of 0.96 mg/dL).  Liver Function Tests: Recent Labs  Lab 04/15/21 1635 04/16/21 0214  AST 49* 38  ALT 135* 106*  ALKPHOS 64 57  BILITOT 0.6 0.8  PROT 7.2 6.5  ALBUMIN 3.6 3.2*    CBG: No results for input(s): GLUCAP in the last 168 hours.  Recent Results (from the past 240 hour(s))  Resp Panel by RT-PCR (Flu A&B, Covid) Nasopharyngeal Swab     Status: None   Collection Time: 04/15/21  5:04 PM   Specimen: Nasopharyngeal Swab; Nasopharyngeal(NP) swabs in vial transport medium  Result Value Ref Range Status   SARS Coronavirus 2 by RT PCR NEGATIVE NEGATIVE Final    Comment: (NOTE) SARS-CoV-2 target nucleic acids are NOT DETECTED.  The SARS-CoV-2 RNA is generally detectable in upper respiratory specimens during the acute phase of  infection. The lowest concentration of SARS-CoV-2 viral copies this assay can detect is 138 copies/mL. A negative result does not preclude SARS-Cov-2 infection and should not be used as the sole basis for treatment or other patient management decisions. A negative result may occur with  improper specimen collection/handling, submission of specimen other than nasopharyngeal swab, presence of viral mutation(s) within the areas targeted by this assay, and inadequate number of viral copies(<138 copies/mL). A negative result must be combined with clinical observations, patient history, and epidemiological information. The expected result is Negative.  Fact Sheet for Patients:  EntrepreneurPulse.com.au  Fact Sheet for Healthcare Providers:  IncredibleEmployment.be  This test is no t yet approved or cleared by the [place] FDA and  has been authorized for detection and/or diagnosis of SARS-CoV-2 by FDA under an Emergency Use Authorization (EUA). This EUA will remain  in effect (meaning this test can be used) for the duration of the COVID-19 declaration  under Section 564(b)(1) of the Act, 21 U.S.C.section 360bbb-3(b)(1), unless the authorization is terminated  or revoked sooner.       Influenza A by PCR NEGATIVE NEGATIVE Final   Influenza B by PCR NEGATIVE NEGATIVE Final    Comment: (NOTE) The Xpert Xpress SARS-CoV-2/FLU/RSV plus assay is intended as an aid in the diagnosis of influenza from Nasopharyngeal swab specimens and should not be used as a sole basis for treatment. Nasal washings and aspirates are unacceptable for Xpert Xpress SARS-CoV-2/FLU/RSV testing.  Fact Sheet for Patients: EntrepreneurPulse.com.au  Fact Sheet for Healthcare Providers: IncredibleEmployment.be  This test is not yet approved or cleared by the [place] FDA and has been authorized for detection and/or diagnosis of SARS-CoV-2 by FDA under an Emergency Use Authorization (EUA). This EUA will remain in effect (meaning this test can be used) for the duration of the COVID-19 declaration under Section 564(b)(1) of the Act, 21 U.S.C. section 360bbb-3(b)(1), unless the authorization is terminated or revoked.  Performed at [facility], [address], [place], [place] [address]   Resp Panel by RT-PCR (Flu A&B, Covid) Nasopharyngeal Swab     Status: None   Collection Time: 04/21/21  5:34 PM   Specimen: Nasopharyngeal Swab; Nasopharyngeal(NP) swabs in vial transport medium  Result Value Ref Range Status   SARS Coronavirus 2 by RT PCR NEGATIVE NEGATIVE Final    Comment: (NOTE) SARS-CoV-2 target nucleic acids are NOT DETECTED.  The SARS-CoV-2 RNA is generally detectable in upper respiratory specimens during the acute phase of infection. The lowest concentration of SARS-CoV-2 viral copies this assay can detect is 138 copies/mL. A negative result does not preclude SARS-Cov-2 infection and should not be used as the sole basis for treatment or other patient management decisions. A negative result may occur with  improper  specimen collection/handling, submission of specimen other than nasopharyngeal swab, presence of viral mutation(s) within the areas targeted by this assay, and inadequate number of viral copies(<138 copies/mL). A negative result must be combined with clinical observations, patient history, and epidemiological information. The expected result is Negative.  Fact Sheet for Patients:  EntrepreneurPulse.com.au  Fact Sheet for Healthcare Providers:  IncredibleEmployment.be  This test is no t yet approved or cleared by the [place] FDA and  has been authorized for detection and/or diagnosis of SARS-CoV-2 by FDA under an Emergency Use Authorization (EUA). This EUA will remain  in effect (meaning this test can be used) for the duration of the COVID-19 declaration under Section 564(b)(1) of the Act, 21 U.S.C.section 360bbb-3(b)(1), unless the authorization is  terminated  or revoked sooner.       Influenza A by PCR NEGATIVE NEGATIVE Final   Influenza B by PCR NEGATIVE NEGATIVE Final    Comment: (NOTE) The Xpert Xpress SARS-CoV-2/FLU/RSV plus assay is intended as an aid in the diagnosis of influenza from Nasopharyngeal swab specimens and should not be used as a sole basis for treatment. Nasal washings and aspirates are unacceptable for Xpert Xpress SARS-CoV-2/FLU/RSV testing.  Fact Sheet for Patients: EntrepreneurPulse.com.au  Fact Sheet for Healthcare Providers: IncredibleEmployment.be  This test is not yet approved or cleared by the [place] FDA and has been authorized for detection and/or diagnosis of SARS-CoV-2 by FDA under an Emergency Use Authorization (EUA). This EUA will remain in effect (meaning this test can be used) for the duration of the COVID-19 declaration under Section 564(b)(1) of the Act, 21 U.S.C. section 360bbb-3(b)(1), unless the authorization is terminated or revoked.  Performed at  [facility], [address], [place], [place] [address]      Radiology Studies: CT Angio Chest PE W/Cm &/Or Wo Cm  Result Date: 04/21/2021 CLINICAL DATA:  Recent diagnosis pulmonary embolism, currently anticoagulated, worsening shortness of breath EXAM: CT ANGIOGRAPHY CHEST WITH CONTRAST TECHNIQUE: Multidetector CT imaging of the chest was performed using the standard protocol during bolus administration of intravenous contrast. Multiplanar CT image reconstructions and MIPs were obtained to evaluate the vascular anatomy. CONTRAST:  142mL OMNIPAQUE IOHEXOL 350 MG/ML SOLN COMPARISON:  04/15/2021 FINDINGS: Cardiovascular: Satisfactory opacification of the pulmonary arteries to the segmental level. Redemonstrated saddle embolus with a large burden of bilateral lobar and more distal embolus. There is a significantly increased burden of embolus bilaterally, although particularly in the left upper lobe. Global cardiomegaly, with gross enlargement of the RV LV ratio, greater than 1.6-1. No pericardial effusion. The main pulmonary artery is enlarged, measuring up to 3.8 cm in caliber. Mediastinum/Nodes: No enlarged mediastinal, hilar, or axillary lymph nodes. Thyroid gland, trachea, and esophagus demonstrate no significant findings. Lungs/Pleura: Lungs are clear. No pleural effusion or pneumothorax. Upper Abdomen: No acute abnormality. Musculoskeletal: No chest wall abnormality. No acute or significant osseous findings. Review of the MIP images confirms the above findings. IMPRESSION: 1. Redemonstrated saddle pulmonary embolus with a large burden of bilateral lobar and more distal embolus throughout all lobes of the lungs. There is a significantly increased burden of embolus bilaterally, although particularly in the left upper lobe. 2. Global cardiomegaly, with gross enlargement of the RV LV ratio, greater than 1.6-1. The main pulmonary artery is enlarged, measuring up to 3.8 cm in caliber. Findings are similar to  prior examination and concerning for right heart strain. Electronically Signed   By: Eddie Candle M.D.   On: 04/21/2021 15:37   DG Chest Port 1 View  Result Date: 04/21/2021 CLINICAL DATA:  Shortness of breath EXAM: PORTABLE CHEST 1 VIEW COMPARISON:  04/15/2021 FINDINGS: Cardiac shadow is enlarged but somewhat accentuated by the portable technique. The lungs are clear bilaterally. No focal infiltrate is seen. Mild central vascular congestion is noted without edema. IMPRESSION: Mild central vascular congestion without parenchymal edema. No other focal abnormality is noted. Electronically Signed   By: Inez Catalina M.D.   On: 04/21/2021 09:30   Scheduled Meds: . diltiazem  240 mg Oral Daily  . enoxaparin (LOVENOX) injection  140 mg Subcutaneous Q12H  . loratadine  10 mg Oral Daily  . pantoprazole  40 mg Oral Daily  . risperiDONE  2 mg Oral QHS  . warfarin  7.5 mg Oral  ONCE-1600  . Warfarin - Pharmacist Dosing Inpatient   Does not apply q1600   Continuous Infusions:   LOS: 1 day   Time spent: 24 mins   Malayzia Laforte Wynetta Emery, MD How to contact the [facility] Attending or Consulting provider [place] or covering provider during after hours [place], for this patient?  1. Check the care team in [facility] and look for a) attending/consulting [facility] provider listed and b) the [facility] team listed 2. Log into [email or web address] and use [facility]'s universal password to access. If you do not have the password, please contact the hospital operator. 3. Locate the [facility] provider you are looking for under [facility] and page to a number that you can be directly reached. 4. If you still have difficulty reaching the provider, please page the [facility] (Director on Call) for the Hospitalists listed on amion for assistance.  04/22/2021, 2:04 PM

## 2021-04-23 DIAGNOSIS — I2692 Saddle embolus of pulmonary artery without acute cor pulmonale: Secondary | ICD-10-CM | POA: Diagnosis not present

## 2021-04-23 DIAGNOSIS — I10 Essential (primary) hypertension: Secondary | ICD-10-CM | POA: Diagnosis not present

## 2021-04-23 LAB — PROTIME-INR
INR: 1.4 — ABNORMAL HIGH (ref 0.8–1.2)
Prothrombin Time: 17.5 seconds — ABNORMAL HIGH (ref 11.4–15.2)

## 2021-04-23 LAB — CBC
HCT: 34.7 % — ABNORMAL LOW (ref 36.0–46.0)
Hemoglobin: 10.7 g/dL — ABNORMAL LOW (ref 12.0–15.0)
MCH: 27.7 pg (ref 26.0–34.0)
MCHC: 30.8 g/dL (ref 30.0–36.0)
MCV: 89.9 fL (ref 80.0–100.0)
Platelets: 270 10*3/uL (ref 150–400)
RBC: 3.86 MIL/uL — ABNORMAL LOW (ref 3.87–5.11)
RDW: 16.6 % — ABNORMAL HIGH (ref 11.5–15.5)
WBC: 6.8 10*3/uL (ref 4.0–10.5)
nRBC: 0 % (ref 0.0–0.2)

## 2021-04-23 LAB — BASIC METABOLIC PANEL
Anion gap: 6 (ref 5–15)
BUN: 6 mg/dL — ABNORMAL LOW (ref 8–23)
CO2: 24 mmol/L (ref 22–32)
Calcium: 8.4 mg/dL — ABNORMAL LOW (ref 8.9–10.3)
Chloride: 108 mmol/L (ref 98–111)
Creatinine, Ser: 0.99 mg/dL (ref 0.44–1.00)
GFR, Estimated: >60 mL/min (ref >60)
Glucose, Bld: 105 mg/dL — ABNORMAL HIGH (ref 70–99)
Potassium: 3.5 mmol/L (ref 3.5–5.1)
Sodium: 138 mmol/L (ref 135–145)

## 2021-04-23 LAB — MAGNESIUM: Magnesium: 1.9 mg/dL (ref 1.7–2.4)

## 2021-04-23 MED ORDER — WARFARIN SODIUM 7.5 MG PO TABS
7.5000 mg | ORAL_TABLET | Freq: Once | ORAL | Status: AC
  Administered 2021-04-23: 7.5 mg via ORAL
  Filled 2021-04-23: qty 1

## 2021-04-23 NOTE — Progress Notes (Signed)
PROGRESS NOTE   Megan Oneal  DGU:[id number] DOB: 02/25/1956 DOA: 04/21/2021 PCP: Sharilyn Sites, MD   Chief Complaint  Patient presents with  . Shortness of Breath   Level of care: Telemetry  Brief Admission History:  65 y.o. female with medical history significant for hypertension, obesity, diabetes mellitus, recent hospitalization for pulmonary embolism (CTA chest from 04/15/21 noted) -Pt discharged on 04/18/21 on apixaban starter pack, had started apixaban in hospital after 30 hours of IV heparin infusion  - Pt reported she did Not miss any doses of apixaban since being at home.    She presented Tachycardic to 121, tachypneic to 24, blood pressure systolic 956L to 875I.  O2 sats 93 to 96% on room air.  Repeat CTA  chest on 04/21/21 redemonstrated saddle pulmonary embolus with large burden of bilateral low back and more distal embolus bilaterally, with significantly increased burden of embolus bilaterally.  EDP talked with Dr. Delton Coombes, recommended Lovenox bridging to Coumadin and she had not missed any Eliquis doses, recommended admission, and repeat echo. EDP talked to critical care Dr. Melvyn Novas, no intervention needed, okay to admit here.   Assessment & Plan:   Principal Problem:   Pulmonary embolus (HCC) Active Problems:   Hypertension   Diabetes ([place])   1)Acute saddle pulmonary embolus and bilateral clots and DVT -  -Very very heavy clot burden (please see CTA chest from 04/15/2021 and repeat CTA chest from 04/21/2021 as well as lower extremity venous Dopplers from 04/16/2021) -Echo with right heart strain -Pt discharged on 04/18/21 on apixaban starter pack, had started apixaban in hospital after 30 hours of IV heparin infusion  - Pt reported she did Not miss any doses of apixaban since being at home.   -Readmitted 04/21/2021 with symptomatic thromboembolic -She will need IV heparin versus Lovenox for at least 5 days, and she will need to overlap with 2 consecutive days of therapeutic INR  while on Coumadin -Given the difficulties and logistics with Coumadin it might be practical to discharge patient on Eliquis -This is probably not an Eliquis failure per se -Please note that the patient had very heavy clot burden, initially she also received IV heparin for just 30 hours, prior to being transitioned to apixaban and discharged -Again this may Not be on apixaban failure per se patient may not have done well just due to the heavy clot burden associated with bilateral lower extremity DVTs and bilateral PE/symptomatic saddle embolism -Patient will probably need lifelong anticoagulation or extensive hypercoagulability work-up prior to discontinuing anticoagulation in the future -I would lean towards lifelong anticoagulation -Need to rule out underlying malignancy -Very symptomatic Saddle pulmonary embolism, high risk/increased mortality associated with right heart strain, need for anticoagulation for at least 5 days, and  overlapping with therapeutic INR for at least 2 consecutive days prior to discharge  2)History of recent hospitalization for auditory hallucinations - she has been stable on risperdal    3) acute anemia--- in the setting of extensive clots and recently initiated full anticoagulation - Hgb is stable, monitor closely and transfuse if clinically indicated  4)Pre-Diabetes--A1c from 03/29/2021 is 6.4 -Dietary modifications advised  5)Morbid Obesity- -Low calorie diet, portion control and increase physical activity discussed with patient -Body mass index is 47.79 kg/m.   DVT prophylaxis: enoxaparin bridge to warfarin Code Status: full  Family Communication: plan of care discussed with patient at bedside, who verbalized understanding Disposition: home  Status is: Inpatient  Remains inpatient appropriate because:Very symptomatic Saddle pulmonary embolism, high risk/increased mortality  associated with right heart strain, need for anticoagulation for at least 5 days, and   overlapping with therapeutic INR for at least 2 consecutive days prior to discharge  Dispo: The patient is from: Home              Anticipated d/c is to: Home              Patient currently is not medically stable to d/c.   Difficult to place patient No  Consultants:   Hematologist Dr. Delton Coombes  Procedures:   echo  Antimicrobials:    Subjective: Shortness of breath at rest, dyspnea on exertion persist,  -No bleeding concerns   Objective: Vitals:   04/23/21 0800 04/23/21 0809 04/23/21 0900 04/23/21 1019  BP: (!) 131/58  128/63 140/73  Pulse: 95  (!) 103 (!) 116  Resp: 13  (!) 21 19  Temp:  98.7 F (37.1 C)    TempSrc:  Oral    SpO2: 94%  95% 94%  Weight:      Height:        Intake/Output Summary (Last 24 hours) at 04/23/2021 1203 Last data filed at 04/22/2021 2200 Gross per 24 hour  Intake 440 ml  Output --  Net 440 ml   Filed Weights   04/21/21 0817 04/21/21 1841  Weight: 136.1 kg 134.3 kg    Examination:   Physical Exam  Gen:- Awake Alert,  In no apparent distress , morbidly obese HEENT:- [place].AT, No sclera icterus Neck-Supple Neck,No JVD,.  Lungs-  Diminished breath sounds , no wheezing CV- S1, S2 normal, tachy Abd-  +ve B.Sounds, Abd Soft, No tenderness,    Extremity/Skin:-+ edema Psych-affect is appropriate, oriented x3 Neuro-no new focal deficits, no tremors    Data Reviewed: I have personally reviewed following labs and imaging studies  CBC: Recent Labs  Lab 04/17/21 0526 04/18/21 0405 04/21/21 0936 04/22/21 0533 04/23/21 0541  WBC 8.7 6.5 7.6 6.8 6.8  NEUTROABS  --   --  4.2  --   --   HGB 10.9* 10.8* 12.6 11.2* 10.7*  HCT 35.2* 34.8* 42.1 36.2 34.7*  MCV 90.5 91.1 93.1 90.5 89.9  PLT 244 244 286 291 [id number]    Basic Metabolic Panel: Recent Labs  Lab 04/17/21 0526 04/21/21 0936 04/22/21 0533 04/23/21 0541  NA 138 138 139 138  K 3.7 3.4* 3.3* 3.5  CL 105 106 107 108  CO2 24 21* 23 24  GLUCOSE 123* 122* 106* 105*  BUN 7* <5*  <5* 6*  CREATININE 0.99 0.91 0.96 0.99  CALCIUM 8.5* 8.9 8.5* 8.4*  MG 1.7  --   --  1.9    GFR: Estimated Creatinine Clearance: 82 mL/min (by C-G formula based on SCr of 0.99 mg/dL).  Liver Function Tests: No results for input(s): AST, ALT, ALKPHOS, BILITOT, PROT, ALBUMIN in the last 168 hours.  CBG: No results for input(s): GLUCAP in the last 168 hours.  Recent Results (from the past 240 hour(s))  Resp Panel by RT-PCR (Flu A&B, Covid) Nasopharyngeal Swab     Status: None   Collection Time: 04/15/21  5:04 PM   Specimen: Nasopharyngeal Swab; Nasopharyngeal(NP) swabs in vial transport medium  Result Value Ref Range Status   SARS Coronavirus 2 by RT PCR NEGATIVE NEGATIVE Final    Comment: (NOTE) SARS-CoV-2 target nucleic acids are NOT DETECTED.  The SARS-CoV-2 RNA is generally detectable in upper respiratory specimens during the acute phase of infection. The lowest concentration of SARS-CoV-2 viral copies this assay  can detect is 138 copies/mL. A negative result does not preclude SARS-Cov-2 infection and should not be used as the sole basis for treatment or other patient management decisions. A negative result may occur with  improper specimen collection/handling, submission of specimen other than nasopharyngeal swab, presence of viral mutation(s) within the areas targeted by this assay, and inadequate number of viral copies(<138 copies/mL). A negative result must be combined with clinical observations, patient history, and epidemiological information. The expected result is Negative.  Fact Sheet for Patients:  EntrepreneurPulse.com.au  Fact Sheet for Healthcare Providers:  IncredibleEmployment.be  This test is no t yet approved or cleared by the [place] FDA and  has been authorized for detection and/or diagnosis of SARS-CoV-2 by FDA under an Emergency Use Authorization (EUA). This EUA will remain  in effect (meaning this test can  be used) for the duration of the COVID-19 declaration under Section 564(b)(1) of the Act, 21 U.S.C.section 360bbb-3(b)(1), unless the authorization is terminated  or revoked sooner.       Influenza A by PCR NEGATIVE NEGATIVE Final   Influenza B by PCR NEGATIVE NEGATIVE Final    Comment: (NOTE) The Xpert Xpress SARS-CoV-2/FLU/RSV plus assay is intended as an aid in the diagnosis of influenza from Nasopharyngeal swab specimens and should not be used as a sole basis for treatment. Nasal washings and aspirates are unacceptable for Xpert Xpress SARS-CoV-2/FLU/RSV testing.  Fact Sheet for Patients: EntrepreneurPulse.com.au  Fact Sheet for Healthcare Providers: IncredibleEmployment.be  This test is not yet approved or cleared by the [place] FDA and has been authorized for detection and/or diagnosis of SARS-CoV-2 by FDA under an Emergency Use Authorization (EUA). This EUA will remain in effect (meaning this test can be used) for the duration of the COVID-19 declaration under Section 564(b)(1) of the Act, 21 U.S.C. section 360bbb-3(b)(1), unless the authorization is terminated or revoked.  Performed at [facility], [address], [place], [place] [address]   Resp Panel by RT-PCR (Flu A&B, Covid) Nasopharyngeal Swab     Status: None   Collection Time: 04/21/21  5:34 PM   Specimen: Nasopharyngeal Swab; Nasopharyngeal(NP) swabs in vial transport medium  Result Value Ref Range Status   SARS Coronavirus 2 by RT PCR NEGATIVE NEGATIVE Final    Comment: (NOTE) SARS-CoV-2 target nucleic acids are NOT DETECTED.  The SARS-CoV-2 RNA is generally detectable in upper respiratory specimens during the acute phase of infection. The lowest concentration of SARS-CoV-2 viral copies this assay can detect is 138 copies/mL. A negative result does not preclude SARS-Cov-2 infection and should not be used as the sole basis for treatment or other patient management  decisions. A negative result may occur with  improper specimen collection/handling, submission of specimen other than nasopharyngeal swab, presence of viral mutation(s) within the areas targeted by this assay, and inadequate number of viral copies(<138 copies/mL). A negative result must be combined with clinical observations, patient history, and epidemiological information. The expected result is Negative.  Fact Sheet for Patients:  EntrepreneurPulse.com.au  Fact Sheet for Healthcare Providers:  IncredibleEmployment.be  This test is no t yet approved or cleared by the [place] FDA and  has been authorized for detection and/or diagnosis of SARS-CoV-2 by FDA under an Emergency Use Authorization (EUA). This EUA will remain  in effect (meaning this test can be used) for the duration of the COVID-19 declaration under Section 564(b)(1) of the Act, 21 U.S.C.section 360bbb-3(b)(1), unless the authorization is terminated  or revoked sooner.  Influenza A by PCR NEGATIVE NEGATIVE Final   Influenza B by PCR NEGATIVE NEGATIVE Final    Comment: (NOTE) The Xpert Xpress SARS-CoV-2/FLU/RSV plus assay is intended as an aid in the diagnosis of influenza from Nasopharyngeal swab specimens and should not be used as a sole basis for treatment. Nasal washings and aspirates are unacceptable for Xpert Xpress SARS-CoV-2/FLU/RSV testing.  Fact Sheet for Patients: EntrepreneurPulse.com.au  Fact Sheet for Healthcare Providers: IncredibleEmployment.be  This test is not yet approved or cleared by the [place] FDA and has been authorized for detection and/or diagnosis of SARS-CoV-2 by FDA under an Emergency Use Authorization (EUA). This EUA will remain in effect (meaning this test can be used) for the duration of the COVID-19 declaration under Section 564(b)(1) of the Act, 21 U.S.C. section 360bbb-3(b)(1), unless the  authorization is terminated or revoked.  Performed at [facility] [address], [place], [place] [address]      Radiology Studies: CT Angio Chest PE W/Cm &/Or Wo Cm  Result Date: 04/21/2021 CLINICAL DATA:  Recent diagnosis pulmonary embolism, currently anticoagulated, worsening shortness of breath EXAM: CT ANGIOGRAPHY CHEST WITH CONTRAST TECHNIQUE: Multidetector CT imaging of the chest was performed using the standard protocol during bolus administration of intravenous contrast. Multiplanar CT image reconstructions and MIPs were obtained to evaluate the vascular anatomy. CONTRAST:  181mL OMNIPAQUE IOHEXOL 350 MG/ML SOLN COMPARISON:  04/15/2021 FINDINGS: Cardiovascular: Satisfactory opacification of the pulmonary arteries to the segmental level. Redemonstrated saddle embolus with a large burden of bilateral lobar and more distal embolus. There is a significantly increased burden of embolus bilaterally, although particularly in the left upper lobe. Global cardiomegaly, with gross enlargement of the RV LV ratio, greater than 1.6-1. No pericardial effusion. The main pulmonary artery is enlarged, measuring up to 3.8 cm in caliber. Mediastinum/Nodes: No enlarged mediastinal, hilar, or axillary lymph nodes. Thyroid gland, trachea, and esophagus demonstrate no significant findings. Lungs/Pleura: Lungs are clear. No pleural effusion or pneumothorax. Upper Abdomen: No acute abnormality. Musculoskeletal: No chest wall abnormality. No acute or significant osseous findings. Review of the MIP images confirms the above findings. IMPRESSION: 1. Redemonstrated saddle pulmonary embolus with a large burden of bilateral lobar and more distal embolus throughout all lobes of the lungs. There is a significantly increased burden of embolus bilaterally, although particularly in the left upper lobe. 2. Global cardiomegaly, with gross enlargement of the RV LV ratio, greater than 1.6-1. The main pulmonary artery is enlarged,  measuring up to 3.8 cm in caliber. Findings are similar to prior examination and concerning for right heart strain. Electronically Signed   By: Eddie Candle M.D.   On: 04/21/2021 15:37   ECHOCARDIOGRAM LIMITED  Result Date: 04/22/2021    ECHOCARDIOGRAM LIMITED REPORT   Patient Name:   Megan Oneal Date of Exam: 04/22/2021 Medical Rec #:  [id number]    Height:       66.0 in Accession #:    [id number]   Weight:       296.1 lb Date of Birth:  13-Dec-1956    BSA:          2.363 m Patient Age:    21 years     BP:           126/80 mmHg Patient Gender: F            HR:           109 bpm. Exam Location:  [facility] Procedure: Limited Echo, Cardiac Doppler and Color Doppler Indications:  I26.02 Pulmonary embolus  History:        Patient has prior history of Echocardiogram examinations, most                 recent 04/17/2021. Risk Factors:Hypertension and Diabetes.                 Pulmonary embolus.  Sonographer:    Roseanna Rainbow RDCS (AE) Referring Phys: [id number] Leanne Chang [facility]  Sonographer Comments: Patient is morbidly obese. Image acquisition challenging due to patient body habitus. IMPRESSIONS  1. Left ventricular ejection fraction, by estimation, is 65 to 70%. The left ventricle has normal function.  2. The ventricular septum is flattened in systole consistent with RV pressure overload. Relatively preserved motion of the RV anulus, RV TAPSE and S' were not performed. The RV free wall appears hypokinetic. Overall moderate RV dysfunction. . The right ventricular size is moderately enlarged.  3. The pericardial effusion is circumferential. There is no evidence of cardiac tamponade.  4. The aortic valve is tricuspid. FINDINGS  Left Ventricle: Left ventricular ejection fraction, by estimation, is 65 to 70%. The left ventricle has normal function. The left ventricular internal cavity size was normal in size. There is no left ventricular hypertrophy. Right Ventricle: The ventricular septum is flattened in systole consistent with  RV pressure overload. Relatively preserved motion of the RV anulus, RV TAPSE and S' were not performed. The RV free wall appears hypokinetic. Overall moderate RV dysfunction.  The right ventricular size is moderately enlarged. Pericardium: Trivial pericardial effusion is present. The pericardial effusion is circumferential. There is no evidence of cardiac tamponade. Aortic Valve: The aortic valve is tricuspid. Pulmonary Artery: Moderate to severe pulmonary HTN, PASP is 64 mmHg. LEFT VENTRICLE PLAX 2D LVIDd:         4.28 cm LVIDs:         2.46 cm LV PW:         1.38 cm LV IVS:        1.42 cm LVOT diam:     3.20 cm LVOT Area:     8.04 cm  LV Volumes (MOD) LV vol d, MOD A2C: 63.0 ml LV vol d, MOD A4C: 70.1 ml LV vol s, MOD A2C: 24.6 ml LV vol s, MOD A4C: 29.7 ml LV SV MOD A2C:     38.4 ml LV SV MOD A4C:     70.1 ml LV SV MOD BP:      42.1 ml LEFT ATRIUM         Index LA diam:    3.40 cm 1.44 cm/m   AORTA Ao Root diam: 3.00 cm  SHUNTS Systemic Diam: 3.20 cm Carlyle Dolly MD Electronically signed by Carlyle Dolly MD Signature Date/Time: 04/22/2021/2:23:05 PM    Final    Scheduled Meds: . Chlorhexidine Gluconate Cloth  6 each Topical Daily  . diltiazem  240 mg Oral Daily  . enoxaparin (LOVENOX) injection  140 mg Subcutaneous Q12H  . loratadine  10 mg Oral Daily  . pantoprazole  40 mg Oral Daily  . risperiDONE  2 mg Oral QHS  . warfarin  7.5 mg Oral ONCE-1600  . Warfarin - Pharmacist Dosing Inpatient   Does not apply q1600   Continuous Infusions:   LOS: 2 days   Roxan Hockey, MD How to contact the [facility] Attending or Consulting provider [place] or covering provider during after hours [place], for this patient?  1. Check the care team in [facility] and look for a)  attending/consulting [facility] provider listed and b) the [facility] team listed 2. Log into [email or web address] and use [place] universal password to access. If you do not have the password, please contact the hospital operator. 3. Locate the [facility] provider you  are looking for under [facility] and page to a number that you can be directly reached. 4. If you still have difficulty reaching the provider, please page the [facility] (Director on Call) for the Hospitalists listed on amion for assistance.  04/23/2021, 12:03 PM

## 2021-04-23 NOTE — Progress Notes (Signed)
ANTICOAGULATION CONSULT NOTE -   Pharmacy Consult for Lovenox and Warfarin (failed Eliquis) Indication: pulmonary embolus  Allergies  Allergen Reactions  . Metoprolol Other (See Comments)    Fatigue and malaise; wheezing  . Sulfa Antibiotics    Patient Measurements: Height: 5\' 6"  (167.6 cm) Weight: 134.3 kg (296 lb 1.2 oz) IBW/kg (Calculated) : 59.3  Vital Signs: Temp: 98.7 F (37.1 C) (05/04 0809) Temp Source: Oral (05/04 0809) BP: 131/58 (05/04 0800) Pulse Rate: 95 (05/04 0800)  Labs: Recent Labs    04/21/21 0936 04/21/21 1726 04/22/21 0533 04/23/21 0541  HGB 12.6  --  11.2* 10.7*  HCT 42.1  --  36.2 34.7*  PLT 286  --  291 270  APTT  --  29  --   --   LABPROT  --  19.6* 18.1* 17.5*  INR  --  1.7* 1.5* 1.4*  CREATININE 0.91  --  0.96 0.99    Estimated Creatinine Clearance: 82 mL/min (by C-G formula based on SCr of 0.99 mg/dL).   Medical History: Past Medical History:  Diagnosis Date  . Anxiety 03/12/2013  . Degenerative disc disease, lumbar    Epidural injections in 2007  . Diabetes mellitus without complication ([place])   . Endometriosis   . Frequent PVCs    12,000/24 hours  . Hypertension   . Otalgia, unspecified   . Palpitations 10/04/2010  . Vascular bruit 01/2013   Right neck    Medications:  Medications Prior to Admission  Medication Sig Dispense Refill Last Dose  . APIXABAN (ELIQUIS) VTE STARTER PACK (10MG  AND 5MG ) Take as directed on package: start with two-5mg  tablets twice daily for 7 days. On day 8, switch to one-5mg  tablet twice daily. 1 each 0 04/21/2021 at 0730  . cholecalciferol (VITAMIN D3) 25 MCG (1000 UT) tablet Take 1,000 Units by mouth daily.   04/20/2021 at Unknown time  . dexlansoprazole (DEXILANT) 60 MG capsule Take 60 mg by mouth daily.   04/20/2021 at Unknown time  . diltiazem (CARDIZEM CD) 240 MG 24 hr capsule Take 1 capsule (240 mg total) by mouth daily. 30 capsule 2 04/20/2021 at Unknown time  . ferrous sulfate 325 (65 FE) MG tablet  Take 325 mg by mouth daily with breakfast.    04/20/2021 at Unknown time  . fluticasone (FLONASE) 50 MCG/ACT nasal spray Place 2 sprays into both nostrils daily as needed for allergies.     . furosemide (LASIX) 40 MG tablet Take 40 mg by mouth every other day.    04/20/2021 at Unknown time  . ipratropium (ATROVENT) 0.03 % nasal spray Place 2 sprays into both nostrils 3 (three) times daily as needed for rhinitis.   04/20/2021 at Unknown time  . levocetirizine (XYZAL) 5 MG tablet Take 5 mg by mouth daily.   04/20/2021 at Unknown time  . polyethylene glycol powder (GLYCOLAX/MIRALAX) powder 1 scoop daily or as needed 255 g 11   . risperiDONE (RISPERDAL) 2 MG tablet Take 1 tablet (2 mg total) by mouth at bedtime. 10 tablet 0 04/20/2021 at Unknown time  . vitamin C (ASCORBIC ACID) 500 MG tablet Take 500 mg by mouth daily.   04/20/2021 at Unknown time  . acetaminophen (TYLENOL) 325 MG tablet Take 2 tablets (650 mg total) by mouth every 6 (six) hours as needed for mild pain (or Fever >/= 101).       Assessment: Last dose of Eliquis was reported 5/3  at 0730, Baseline labs pending Per MD, She has not missed  doses of Eliquis and so oncologist would recommend Lovenox bridging to Coumadin.  Given her significantly increased clot burden and subjectively worsening symptoms feel she would benefit from admit to start this process and consider repeat echo.   INR 1.4- subtherapeutic. Likely INR was affected by eliquis.  Goal of Therapy:  INR 2-3 Monitor platelets by anticoagulation protocol: Yes   Plan:  Contine Lovenox 1mg /Kg (140mg ) SQ q12hrs Warfarin 7.5mg  po x 1 today Monitor INR and CBC daily  Isac Sarna, BS [place], [place] Clinical Pharmacist Pager [phone] 04/23/2021 9:09 AM

## 2021-04-24 DIAGNOSIS — I10 Essential (primary) hypertension: Secondary | ICD-10-CM | POA: Diagnosis not present

## 2021-04-24 DIAGNOSIS — I2692 Saddle embolus of pulmonary artery without acute cor pulmonale: Secondary | ICD-10-CM | POA: Diagnosis not present

## 2021-04-24 LAB — GLUCOSE, CAPILLARY: Glucose-Capillary: 106 mg/dL — ABNORMAL HIGH (ref 70–99)

## 2021-04-24 LAB — PROTIME-INR
INR: 1.7 — ABNORMAL HIGH (ref 0.8–1.2)
Prothrombin Time: 20 seconds — ABNORMAL HIGH (ref 11.4–15.2)

## 2021-04-24 LAB — CBC
HCT: 35.6 % — ABNORMAL LOW (ref 36.0–46.0)
Hemoglobin: 11 g/dL — ABNORMAL LOW (ref 12.0–15.0)
MCH: 28.1 pg (ref 26.0–34.0)
MCHC: 30.9 g/dL (ref 30.0–36.0)
MCV: 91 fL (ref 80.0–100.0)
Platelets: 263 10*3/uL (ref 150–400)
RBC: 3.91 MIL/uL (ref 3.87–5.11)
RDW: 16.5 % — ABNORMAL HIGH (ref 11.5–15.5)
WBC: 6.3 10*3/uL (ref 4.0–10.5)
nRBC: 0 % (ref 0.0–0.2)

## 2021-04-24 MED ORDER — WARFARIN SODIUM 7.5 MG PO TABS
7.5000 mg | ORAL_TABLET | Freq: Once | ORAL | Status: AC
  Administered 2021-04-24: 7.5 mg via ORAL
  Filled 2021-04-24: qty 1

## 2021-04-24 NOTE — Progress Notes (Signed)
Pt sat up in chair approx. 6 hours today per MD request. Tolerated well.

## 2021-04-24 NOTE — Progress Notes (Signed)
PROGRESS NOTE   Megan Oneal  BSW:[id number] DOB: [birth year] DOA: 04/21/2021 PCP: Sharilyn Sites, MD   Chief Complaint  Patient presents with  . Shortness of Breath   Level of care: Telemetry  Brief Admission History:  65 y.o. female with medical history significant for hypertension, obesity, diabetes mellitus, recent hospitalization for pulmonary embolism (CTA chest from 04/15/21 noted) -Pt discharged on 04/18/21 on apixaban starter pack, had started apixaban in hospital after 30 hours of IV heparin infusion  - Pt reported she did Not miss any doses of apixaban since being at home.    She presented Tachycardic to 121, tachypneic to 24, blood pressure systolic 466Z to 993T.  O2 sats 93 to 96% on room air.  Repeat CTA  chest on 04/21/21 redemonstrated saddle pulmonary embolus with large burden of bilateral low back and more distal embolus bilaterally, with significantly increased burden of embolus bilaterally.  EDP talked with Dr. Delton Coombes, recommended Lovenox bridging to Coumadin and she had not missed any Eliquis doses, recommended admission, and repeat echo. EDP talked to critical care Dr. Melvyn Novas, no intervention needed, okay to admit here.   Assessment & Plan:   Principal Problem:   Pulmonary embolus (HCC) Active Problems:   Hypertension   Diabetes ([place])   1)Acute saddle pulmonary embolus and bilateral clots and DVT -  -Very very heavy clot burden (please see CTA chest from 04/15/2021 and repeat CTA chest from 04/21/2021 as well as lower extremity venous Dopplers from 04/16/2021) -Echo with right heart strain -Pt discharged on 04/18/21 on apixaban starter pack, had started apixaban in hospital after 30 hours of IV heparin infusion  - Pt reported she did Not miss any doses of apixaban since being at home.   -Readmitted 04/21/2021 with symptomatic thromboembolic -She will need IV heparin versus Lovenox for at least 5 days, and she will need to overlap with 2 consecutive days of therapeutic INR  while on Coumadin -Given the difficulties and logistics with Coumadin it might be practical to discharge patient on Eliquis -This is probably not an Eliquis failure per se -Please note that the patient had very heavy clot burden, initially she also received IV heparin for just 30 hours, prior to being transitioned to apixaban and discharged -Again this may Not be on apixaban failure per se patient may not have done well just due to the heavy clot burden associated with bilateral lower extremity DVTs and bilateral PE/symptomatic saddle embolism -Patient will probably need lifelong anticoagulation or extensive hypercoagulability work-up prior to discontinuing anticoagulation in the future -I would lean towards lifelong anticoagulation -Need to rule out underlying malignancy -Very symptomatic Saddle pulmonary embolism, high risk/increased mortality associated with right heart strain, need for anticoagulation for at least 5 days, and  overlapping with therapeutic INR for at least 2 consecutive days prior to discharge as of 04/24/21 INR remains subtherapeutic  2)History of recent hospitalization for auditory hallucinations - she has been stable on risperdal    3) acute anemia--- in the setting of extensive clots and recently initiated full anticoagulation - Hgb is stable, monitor closely and transfuse if clinically indicated  4)Pre-Diabetes--A1c from 03/29/2021 is 6.4 -Dietary modifications advised  5)Morbid Obesity- -Low calorie diet, portion control and increase physical activity discussed with patient -Body mass index is 47.79 kg/m.   DVT prophylaxis: enoxaparin bridge to warfarin Code Status: full  Family Communication: plan of care discussed with patient at bedside, who verbalized understanding Disposition: home  Status is: Inpatient  Remains inpatient appropriate because:Very symptomatic  Saddle pulmonary embolism, high risk/increased mortality associated with right heart strain, need for  anticoagulation for at least 5 days, and  overlapping with therapeutic INR for at least 2 consecutive days prior to discharge  Dispo: The patient is from: Home              Anticipated d/c is to: Home              Patient currently is not medically stable to d/c.   Difficult to place patient No  Consultants:   Hematologist Dr. Delton Coombes  Procedures:   echo  Antimicrobials:    Subjective:  -Leg swelling and discomfort improving, no chest pains, dyspnea with minimal activity persist   Objective: Vitals:   04/24/21 1500 04/24/21 1600 04/24/21 1700 04/24/21 1800  BP: 137/76 (!) 154/82 134/69 (!) 146/75  Pulse: (!) 102 (!) 101 96 (!) 103  Resp: (!) 24 14 17  (!) 22  Temp:  98 F (36.7 C)    TempSrc:  Oral    SpO2: 98% 97% 96% 97%  Weight:      Height:        Intake/Output Summary (Last 24 hours) at 04/24/2021 1909 Last data filed at 04/23/2021 2200 Gross per 24 hour  Intake 200 ml  Output --  Net 200 ml   Filed Weights   04/21/21 0817 04/21/21 1841  Weight: 136.1 kg 134.3 kg    Examination:   Physical Exam  Gen:- Awake Alert,  In no apparent distress , morbidly obese HEENT:- [place].AT, No sclera icterus Neck-Supple Neck,No JVD,.  Lungs-  Diminished breath sounds , no wheezing CV- S1, S2 normal, tachy Abd-  +ve B.Sounds, Abd Soft, No tenderness,    Extremity/Skin:-+ edema Psych-affect is appropriate, oriented x3 Neuro-no new focal deficits, no tremors    Data Reviewed: I have personally reviewed following labs and imaging studies  CBC: Recent Labs  Lab 04/18/21 0405 04/21/21 0936 04/22/21 0533 04/23/21 0541 04/24/21 0428  WBC 6.5 7.6 6.8 6.8 6.3  NEUTROABS  --  4.2  --   --   --   HGB 10.8* 12.6 11.2* 10.7* 11.0*  HCT 34.8* 42.1 36.2 34.7* 35.6*  MCV 91.1 93.1 90.5 89.9 91.0  PLT 244 286 291 270 440    Basic Metabolic Panel: Recent Labs  Lab 04/21/21 0936 04/22/21 0533 04/23/21 0541  NA 138 139 138  K 3.4* 3.3* 3.5  CL 106 107 108  CO2 21*  23 24  GLUCOSE 122* 106* 105*  BUN <5* <5* 6*  CREATININE 0.91 0.96 0.99  CALCIUM 8.9 8.5* 8.4*  MG  --   --  1.9    GFR: Estimated Creatinine Clearance: 82 mL/min (by C-G formula based on SCr of 0.99 mg/dL).  Liver Function Tests: No results for input(s): AST, ALT, ALKPHOS, BILITOT, PROT, ALBUMIN in the last 168 hours.  CBG: Recent Labs  Lab 04/24/21 0736  GLUCAP 106*    Recent Results (from the past 240 hour(s))  Resp Panel by RT-PCR (Flu A&B, Covid) Nasopharyngeal Swab     Status: None   Collection Time: 04/15/21  5:04 PM   Specimen: Nasopharyngeal Swab; Nasopharyngeal(NP) swabs in vial transport medium  Result Value Ref Range Status   SARS Coronavirus 2 by RT PCR NEGATIVE NEGATIVE Final    Comment: (NOTE) SARS-CoV-2 target nucleic acids are NOT DETECTED.  The SARS-CoV-2 RNA is generally detectable in upper respiratory specimens during the acute phase of infection. The lowest concentration of SARS-CoV-2 viral copies this assay  can detect is 138 copies/mL. A negative result does not preclude SARS-Cov-2 infection and should not be used as the sole basis for treatment or other patient management decisions. A negative result may occur with  improper specimen collection/handling, submission of specimen other than nasopharyngeal swab, presence of viral mutation(s) within the areas targeted by this assay, and inadequate number of viral copies(<138 copies/mL). A negative result must be combined with clinical observations, patient history, and epidemiological information. The expected result is Negative.  Fact Sheet for Patients:  EntrepreneurPulse.com.au  Fact Sheet for Healthcare Providers:  IncredibleEmployment.be  This test is no t yet approved or cleared by the [place] FDA and  has been authorized for detection and/or diagnosis of SARS-CoV-2 by FDA under an Emergency Use Authorization (EUA). This EUA will remain  in effect  (meaning this test can be used) for the duration of the COVID-19 declaration under Section 564(b)(1) of the Act, 21 U.S.C.section 360bbb-3(b)(1), unless the authorization is terminated  or revoked sooner.       Influenza A by PCR NEGATIVE NEGATIVE Final   Influenza B by PCR NEGATIVE NEGATIVE Final    Comment: (NOTE) The Xpert Xpress SARS-CoV-2/FLU/RSV plus assay is intended as an aid in the diagnosis of influenza from Nasopharyngeal swab specimens and should not be used as a sole basis for treatment. Nasal washings and aspirates are unacceptable for Xpert Xpress SARS-CoV-2/FLU/RSV testing.  Fact Sheet for Patients: EntrepreneurPulse.com.au  Fact Sheet for Healthcare Providers: IncredibleEmployment.be  This test is not yet approved or cleared by the [place] FDA and has been authorized for detection and/or diagnosis of SARS-CoV-2 by FDA under an Emergency Use Authorization (EUA). This EUA will remain in effect (meaning this test can be used) for the duration of the COVID-19 declaration under Section 564(b)(1) of the Act, 21 U.S.C. section 360bbb-3(b)(1), unless the authorization is terminated or revoked.  Performed at [facility], [address] [place], [place] [address]   Resp Panel by RT-PCR (Flu A&B, Covid) Nasopharyngeal Swab     Status: None   Collection Time: 04/21/21  5:34 PM   Specimen: Nasopharyngeal Swab; Nasopharyngeal(NP) swabs in vial transport medium  Result Value Ref Range Status   SARS Coronavirus 2 by RT PCR NEGATIVE NEGATIVE Final    Comment: (NOTE) SARS-CoV-2 target nucleic acids are NOT DETECTED.  The SARS-CoV-2 RNA is generally detectable in upper respiratory specimens during the acute phase of infection. The lowest concentration of SARS-CoV-2 viral copies this assay can detect is 138 copies/mL. A negative result does not preclude SARS-Cov-2 infection and should not be used as the sole basis for treatment  or other patient management decisions. A negative result may occur with  improper specimen collection/handling, submission of specimen other than nasopharyngeal swab, presence of viral mutation(s) within the areas targeted by this assay, and inadequate number of viral copies(<138 copies/mL). A negative result must be combined with clinical observations, patient history, and epidemiological information. The expected result is Negative.  Fact Sheet for Patients:  EntrepreneurPulse.com.au  Fact Sheet for Healthcare Providers:  IncredibleEmployment.be  This test is no t yet approved or cleared by the [place] FDA and  has been authorized for detection and/or diagnosis of SARS-CoV-2 by FDA under an Emergency Use Authorization (EUA). This EUA will remain  in effect (meaning this test can be used) for the duration of the COVID-19 declaration under Section 564(b)(1) of the Act, 21 U.S.C.section 360bbb-3(b)(1), unless the authorization is terminated  or revoked sooner.  Influenza A by PCR NEGATIVE NEGATIVE Final   Influenza B by PCR NEGATIVE NEGATIVE Final    Comment: (NOTE) The Xpert Xpress SARS-CoV-2/FLU/RSV plus assay is intended as an aid in the diagnosis of influenza from Nasopharyngeal swab specimens and should not be used as a sole basis for treatment. Nasal washings and aspirates are unacceptable for Xpert Xpress SARS-CoV-2/FLU/RSV testing.  Fact Sheet for Patients: EntrepreneurPulse.com.au  Fact Sheet for Healthcare Providers: IncredibleEmployment.be  This test is not yet approved or cleared by the [place] FDA and has been authorized for detection and/or diagnosis of SARS-CoV-2 by FDA under an Emergency Use Authorization (EUA). This EUA will remain in effect (meaning this test can be used) for the duration of the COVID-19 declaration under Section 564(b)(1) of the Act, 21 U.S.C. section  360bbb-3(b)(1), unless the authorization is terminated or revoked.  Performed at [facility], [address], [place], [place] [address]      Radiology Studies: No results found. Scheduled Meds: . Chlorhexidine Gluconate Cloth  6 each Topical Daily  . diltiazem  240 mg Oral Daily  . enoxaparin (LOVENOX) injection  140 mg Subcutaneous Q12H  . loratadine  10 mg Oral Daily  . pantoprazole  40 mg Oral Daily  . risperiDONE  2 mg Oral QHS  . Warfarin - Pharmacist Dosing Inpatient   Does not apply q1600   Continuous Infusions:   LOS: 3 days   Roxan Hockey, MD How to contact the [facility] Attending or Consulting provider [place] or covering provider during after hours [place], for this patient?  1. Check the care team in [facility] and look for a) attending/consulting TRH provider listed and b) the [facility] team listed 2. Log into [email or web address] and use [place]'s universal password to access. If you do not have the password, please contact the hospital operator. 3. Locate the [facility] provider you are looking for under [facility] and page to a number that you can be directly reached. 4. If you still have difficulty reaching the provider, please page the [facility] (Director on Call) for the Hospitalists listed on amion for assistance.  04/24/2021, 7:09 PM

## 2021-04-24 NOTE — TOC Initial Note (Signed)
Transition of Care [facility]) - Initial/Assessment Note    Patient Details  Name: Megan Oneal MRN: [id number] Date of Birth: 1956-07-03  Transition of Care [facility]) CM/SW Contact:    Ihor Gully, LCSW Phone Number: 04/24/2021, 11:43 AM  Clinical Narrative:                 Patient from home alone. Considered high risk for readmission. Independent at baseline. Has cane, RW, 3n1. Uses RW. Active with [employer], corporate, OT, PT, aide). Wants to return home with [facility] at d/c. TOC following.   Barriers to Discharge: Continued Medical Work up   Patient Goals and CMS Choice        Expected Discharge Plan and Services                                                Prior Living Arrangements/Services     Patient language and need for interpreter reviewed:: Yes        Need for Family Participation in Patient Care: Yes (Comment) Care giver support system in place?: Yes (comment)      Activities of Daily Living Home Assistive Devices/Equipment: Walker (specify type),Shower chair with back,Bedside commode/3-in-1 ADL Screening (condition at time of admission) Patient's cognitive ability adequate to safely complete daily activities?: Yes Is the patient deaf or have difficulty hearing?: Yes Does the patient have difficulty seeing, even when wearing glasses/contacts?: No Does the patient have difficulty concentrating, remembering, or making decisions?: No Patient able to express need for assistance with ADLs?: Yes Does the patient have difficulty dressing or bathing?: No Independently performs ADLs?: Yes (appropriate for developmental age) Does the patient have difficulty walking or climbing stairs?: No Weakness of Legs: None Weakness of Arms/Hands: None  Permission Sought/Granted                  Emotional Assessment Appearance:: Appears stated age   Affect (typically observed): Appropriate Orientation: : Oriented to Self,Oriented to Place,Oriented to  Time,Oriented to  Situation Alcohol / Substance Use: Not Applicable Psych Involvement: No (comment)  Admission diagnosis:  Shortness of breath [R06.02] Pulmonary embolism ([place]) [I26.99] Patient Active Problem List   Diagnosis Date Noted  . Diabetes ([place]) 04/21/2021  . Pulmonary embolus ([place]) 04/15/2021  . AKI (acute kidney injury) ([place]) 04/15/2021  . Transaminitis 04/15/2021  . Psychosis ([place]) 04/02/2021  . Acute encephalopathy 03/26/2021  . Auditory hallucinations 03/24/2021  . Otosclerosis 03/24/2021  . Encounter for screening colonoscopy 02/15/2019  . Obesity 03/12/2013  . Anxiety 03/12/2013  . Laboratory test 03/12/2013  . Frequent PVCs   . Degenerative disc disease, lumbar   . Hypertension   . Endometriosis   . Vascular bruit 01/21/2013  . Palpitations 10/04/2010   PCP:  Sharilyn Sites, MD Pharmacy:   [id number] - [place] [place] [address] Phone: [phone] Fax: [phone]     Social Determinants of Health (SDOH) Interventions    Readmission Risk Interventions No flowsheet data found.

## 2021-04-24 NOTE — Progress Notes (Signed)
ANTICOAGULATION CONSULT NOTE -   Pharmacy Consult for Lovenox and Warfarin (failed Eliquis) Indication: pulmonary embolus  Allergies  Allergen Reactions  . Metoprolol Other (See Comments)    Fatigue and malaise; wheezing  . Sulfa Antibiotics    Patient Measurements: Height: 5\' 6"  (167.6 cm) Weight: 134.3 kg (296 lb 1.2 oz) IBW/kg (Calculated) : 59.3  Vital Signs: Temp: 98.1 F (36.7 C) (05/05 0807) Temp Source: Oral (05/05 0807) BP: 149/71 (05/05 0700) Pulse Rate: 104 (05/05 0700)  Labs: Recent Labs    04/21/21 0936 04/21/21 1726 04/21/21 1726 04/22/21 0533 04/23/21 0541 04/24/21 0428  HGB 12.6  --   --  11.2* 10.7* 11.0*  HCT 42.1  --   --  36.2 34.7* 35.6*  PLT 286  --   --  291 270 263  APTT  --  29  --   --   --   --   LABPROT  --  19.6*   < > 18.1* 17.5* 20.0*  INR  --  1.7*   < > 1.5* 1.4* 1.7*  CREATININE 0.91  --   --  0.96 0.99  --    < > = values in this interval not displayed.    Estimated Creatinine Clearance: 82 mL/min (by C-G formula based on SCr of 0.99 mg/dL).   Medical History: Past Medical History:  Diagnosis Date  . Anxiety 03/12/2013  . Degenerative disc disease, lumbar    Epidural injections in 2007  . Diabetes mellitus without complication ([place])   . Endometriosis   . Frequent PVCs    12,000/24 hours  . Hypertension   . Otalgia, unspecified   . Palpitations 10/04/2010  . Vascular bruit 01/2013   Right neck    Medications:  Medications Prior to Admission  Medication Sig Dispense Refill Last Dose  . APIXABAN (ELIQUIS) VTE STARTER PACK (10MG  AND 5MG ) Take as directed on package: start with two-5mg  tablets twice daily for 7 days. On day 8, switch to one-5mg  tablet twice daily. 1 each 0 04/21/2021 at 0730  . cholecalciferol (VITAMIN D3) 25 MCG (1000 UT) tablet Take 1,000 Units by mouth daily.   04/20/2021 at Unknown time  . dexlansoprazole (DEXILANT) 60 MG capsule Take 60 mg by mouth daily.   04/20/2021 at Unknown time  . diltiazem (CARDIZEM  CD) 240 MG 24 hr capsule Take 1 capsule (240 mg total) by mouth daily. 30 capsule 2 04/20/2021 at Unknown time  . ferrous sulfate 325 (65 FE) MG tablet Take 325 mg by mouth daily with breakfast.    04/20/2021 at Unknown time  . fluticasone (FLONASE) 50 MCG/ACT nasal spray Place 2 sprays into both nostrils daily as needed for allergies.     . furosemide (LASIX) 40 MG tablet Take 40 mg by mouth every other day.    04/20/2021 at Unknown time  . ipratropium (ATROVENT) 0.03 % nasal spray Place 2 sprays into both nostrils 3 (three) times daily as needed for rhinitis.   04/20/2021 at Unknown time  . levocetirizine (XYZAL) 5 MG tablet Take 5 mg by mouth daily.   04/20/2021 at Unknown time  . polyethylene glycol powder (GLYCOLAX/MIRALAX) powder 1 scoop daily or as needed 255 g 11   . risperiDONE (RISPERDAL) 2 MG tablet Take 1 tablet (2 mg total) by mouth at bedtime. 10 tablet 0 04/20/2021 at Unknown time  . vitamin C (ASCORBIC ACID) 500 MG tablet Take 500 mg by mouth daily.   04/20/2021 at Unknown time  . acetaminophen (TYLENOL) 325  MG tablet Take 2 tablets (650 mg total) by mouth every 6 (six) hours as needed for mild pain (or Fever >/= 101).       Assessment: Last dose of Eliquis was reported 5/3  at 0730, Baseline labs pending Per MD, She has not missed doses of Eliquis and so oncologist would recommend Lovenox bridging to Coumadin.  Given her significantly increased clot burden and subjectively worsening symptoms feel she would benefit from admit to start this process and consider repeat echo.   INR 1.7- subtherapeutic. But approaching therapeutic  Range  Goal of Therapy:  INR 2-3 Monitor platelets by anticoagulation protocol: Yes   Plan:  Contine Lovenox 1mg /Kg (140mg ) SQ q12hrs Warfarin 7.5mg  po x 1 today Monitor INR and CBC daily  Isac Sarna, BS [place], [place] Clinical Pharmacist Pager [phone] 04/24/2021 8:32 AM

## 2021-04-25 DIAGNOSIS — I10 Essential (primary) hypertension: Secondary | ICD-10-CM | POA: Diagnosis not present

## 2021-04-25 DIAGNOSIS — I2692 Saddle embolus of pulmonary artery without acute cor pulmonale: Secondary | ICD-10-CM | POA: Diagnosis not present

## 2021-04-25 DIAGNOSIS — E1169 Type 2 diabetes mellitus with other specified complication: Secondary | ICD-10-CM

## 2021-04-25 LAB — CBC
HCT: 36.4 % (ref 36.0–46.0)
Hemoglobin: 11.1 g/dL — ABNORMAL LOW (ref 12.0–15.0)
MCH: 27.8 pg (ref 26.0–34.0)
MCHC: 30.5 g/dL (ref 30.0–36.0)
MCV: 91.2 fL (ref 80.0–100.0)
Platelets: 267 10*3/uL (ref 150–400)
RBC: 3.99 MIL/uL (ref 3.87–5.11)
RDW: 16.6 % — ABNORMAL HIGH (ref 11.5–15.5)
WBC: 6.7 10*3/uL (ref 4.0–10.5)
nRBC: 0 % (ref 0.0–0.2)

## 2021-04-25 LAB — GLUCOSE, CAPILLARY
Glucose-Capillary: 114 mg/dL — ABNORMAL HIGH (ref 70–99)
Glucose-Capillary: 126 mg/dL — ABNORMAL HIGH (ref 70–99)
Glucose-Capillary: 112 mg/dL — ABNORMAL HIGH (ref 70–99)

## 2021-04-25 LAB — PROTIME-INR
INR: 2 — ABNORMAL HIGH (ref 0.8–1.2)
Prothrombin Time: 22.3 seconds — ABNORMAL HIGH (ref 11.4–15.2)

## 2021-04-25 MED ORDER — INSULIN ASPART 100 UNIT/ML IJ SOLN: 0.0000 [IU] | Freq: Every day | INTRAMUSCULAR | Status: DC

## 2021-04-25 MED ORDER — DILTIAZEM HCL ER COATED BEADS 300 MG PO CP24
300.0000 mg | ORAL_CAPSULE | Freq: Every day | ORAL | Status: DC
  Administered 2021-04-26: 300 mg via ORAL
  Filled 2021-04-25: qty 1

## 2021-04-25 MED ORDER — INSULIN ASPART 100 UNIT/ML IJ SOLN: 0.0000 [IU] | Freq: Three times a day (TID) | INTRAMUSCULAR | Status: DC

## 2021-04-25 MED ORDER — LOSARTAN POTASSIUM 25 MG PO TABS
25.0000 mg | ORAL_TABLET | Freq: Every day | ORAL | Status: DC
  Administered 2021-04-25 – 2021-04-26 (×2): 25 mg via ORAL
  Filled 2021-04-25 (×2): qty 1

## 2021-04-25 MED ORDER — HYDRALAZINE HCL 20 MG/ML IJ SOLN: 10.0000 mg | Freq: Four times a day (QID) | INTRAMUSCULAR | Status: DC | PRN

## 2021-04-25 NOTE — Progress Notes (Signed)
PROGRESS NOTE   Megan Oneal  RJJ:[id number] DOB: 1956-04-24 DOA: 04/21/2021 PCP: Sharilyn Sites, MD   Chief Complaint  Patient presents with  . Shortness of Breath   Level of care: Telemetry  Brief Admission History:  65 y.o. female with medical history significant for hypertension, obesity, diabetes mellitus, recent hospitalization for pulmonary embolism (CTA chest from 04/15/21 noted) -Pt discharged on 04/18/21 on apixaban starter pack, had started apixaban in hospital after 30 hours of IV heparin infusion  - Pt reported she did Not miss any doses of apixaban since being at home.    She presented Tachycardic to 121, tachypneic to 24, blood pressure systolic 016W to 109N.  O2 sats 93 to 96% on room air.  Repeat CTA  chest on 04/21/21 redemonstrated saddle pulmonary embolus with large burden of bilateral low back and more distal embolus bilaterally, with significantly increased burden of embolus bilaterally.  EDP talked with Dr. Delton Coombes, recommended Lovenox bridging to Coumadin and she had not missed any Eliquis doses, recommended admission, and repeat echo. EDP talked to critical care Dr. Melvyn Novas, no intervention needed, okay to admit here.   Assessment & Plan:   Principal Problem:   Pulmonary embolus (HCC) Active Problems:   Hypertension   Diabetes ([place])   1)Acute saddle pulmonary embolus and bilateral clots and DVT -  -Very very heavy clot burden (please see CTA chest from 04/15/2021 and repeat CTA chest from 04/21/2021 as well as lower extremity venous Dopplers from 04/16/2021) -Echo with right heart strain -Pt discharged on 04/18/21 on apixaban starter pack, had started apixaban in hospital after 30 hours of IV heparin infusion  - Pt reported she did Not miss any doses of apixaban since being at home.   -Readmitted 04/21/2021 with symptomatic thromboembolic -She will need IV heparin versus Lovenox for at least 5 days, and she will need to overlap with 2 consecutive days of therapeutic INR  while on Coumadin -Given the difficulties and logistics with Coumadin it might be practical to discharge patient on Eliquis -This is probably not an Eliquis failure per se -Please note that the patient had very heavy clot burden, initially she also received IV heparin for just 30 hours, prior to being transitioned to apixaban and discharged -Again this may Not be on apixaban failure per se patient may not have done well just due to the heavy clot burden associated with bilateral lower extremity DVTs and bilateral PE/symptomatic saddle embolism -Patient will probably need lifelong anticoagulation or extensive hypercoagulability work-up prior to discontinuing anticoagulation in the future -I would lean towards lifelong anticoagulation -Need to rule out underlying malignancy -Very symptomatic Saddle pulmonary embolism, high risk/increased mortality associated with right heart strain, need for anticoagulation for at least 5 days, and  overlapping with therapeutic INR for at least 2 consecutive days prior to discharge--INR is up to 2.0 from 1.7 -Anticipate discharge home on 04/26/2021 possibly on Eliquis when INR and Coumadin  2)History of recent hospitalization for auditory hallucinations - she has been stable on risperdal --  3)Acute Anemia--- in the setting of extensive clots and recently initiated full anticoagulation - Hgb is stable around 11, monitor closely and transfuse if clinically indicated  4)Pre-Diabetes--A1c from 03/29/2021 is 6.4 Use Novolog/Humalog Sliding scale insulin with Accu-Cheks/Fingersticks as ordered -Dietary modifications advised  5)Morbid Obesity- -Low calorie diet, portion control and increase physical activity discussed with patient -Body mass index is 48.14 kg/m.   6)HTN--BP and heart rate not at goal, increase Cardizem CD to 300 mg daily,  add losartan 25 mg daily for better BP control  7) generalized weakness and deconditioning----PT eval requested  DVT prophylaxis:  enoxaparin bridge to warfarin Code Status: full  Family Communication: plan of care discussed with patient at bedside, who verbalized understanding Disposition: home  Status is: Inpatient  Remains inpatient appropriate because:Very symptomatic Saddle pulmonary embolism, high risk/increased mortality associated with right heart strain, need for anticoagulation for at least 5 days, and  overlapping with therapeutic INR for at least 2 consecutive days prior to discharge  Dispo: The patient is from: Home              Anticipated d/c is to: Home              Patient currently is not medically stable to d/c.   Difficult to place patient No  Consultants:   Hematologist Dr. Delton Coombes  Procedures:   echo  Antimicrobials:    Subjective:  -Able to get out of bed to chair -Dyspnea on exertion persist -No chest pains -No bleeding concerns  Objective: Vitals:   04/25/21 0700 04/25/21 0800 04/25/21 0900 04/25/21 1000  BP: 133/76 (!) 148/61 (!) 152/76 (!) 163/78  Pulse: 91 (!) 109 (!) 105 99  Resp: 15 13 18 15   Temp:      TempSrc:      SpO2: 93% 96% 96% 96%  Weight:      Height:       No intake or output data in the 24 hours ending 04/25/21 1023 Filed Weights   04/21/21 0817 04/21/21 1841 04/25/21 0330  Weight: 136.1 kg 134.3 kg 135.3 kg    Examination:   Physical Exam  Gen:- Awake Alert,  In no apparent distress , morbidly obese HEENT:- [place].AT, No sclera icterus Neck-Supple Neck,No JVD,.  Lungs-  Diminished breath sounds , no wheezing CV- S1, S2 normal, tachy Abd-  +ve B.Sounds, Abd Soft, No tenderness,    Extremity/Skin:-+ edema Psych-affect is appropriate, oriented x3 Neuro-generalized weakness, no new focal deficits, no tremors    Data Reviewed: I have personally reviewed following labs and imaging studies  CBC: Recent Labs  Lab 04/21/21 0936 04/22/21 0533 04/23/21 0541 04/24/21 0428 04/25/21 0505  WBC 7.6 6.8 6.8 6.3 6.7  NEUTROABS 4.2  --   --   --    --   HGB 12.6 11.2* 10.7* 11.0* 11.1*  HCT 42.1 36.2 34.7* 35.6* 36.4  MCV 93.1 90.5 89.9 91.0 91.2  PLT 286 291 270 263 761    Basic Metabolic Panel: Recent Labs  Lab 04/21/21 0936 04/22/21 0533 04/23/21 0541  NA 138 139 138  K 3.4* 3.3* 3.5  CL 106 107 108  CO2 21* 23 24  GLUCOSE 122* 106* 105*  BUN <5* <5* 6*  CREATININE 0.91 0.96 0.99  CALCIUM 8.9 8.5* 8.4*  MG  --   --  1.9    GFR: Estimated Creatinine Clearance: 82.4 mL/min (by C-G formula based on SCr of 0.99 mg/dL).  Liver Function Tests: No results for input(s): AST, ALT, ALKPHOS, BILITOT, PROT, ALBUMIN in the last 168 hours.  CBG: Recent Labs  Lab 04/24/21 0736  GLUCAP 106*    Recent Results (from the past 240 hour(s))  Resp Panel by RT-PCR (Flu A&B, Covid) Nasopharyngeal Swab     Status: None   Collection Time: 04/15/21  5:04 PM   Specimen: Nasopharyngeal Swab; Nasopharyngeal(NP) swabs in vial transport medium  Result Value Ref Range Status   SARS Coronavirus 2 by RT PCR NEGATIVE NEGATIVE Final  Comment: (NOTE) SARS-CoV-2 target nucleic acids are NOT DETECTED.  The SARS-CoV-2 RNA is generally detectable in upper respiratory specimens during the acute phase of infection. The lowest concentration of SARS-CoV-2 viral copies this assay can detect is 138 copies/mL. A negative result does not preclude SARS-Cov-2 infection and should not be used as the sole basis for treatment or other patient management decisions. A negative result may occur with  improper specimen collection/handling, submission of specimen other than nasopharyngeal swab, presence of viral mutation(s) within the areas targeted by this assay, and inadequate number of viral copies(<138 copies/mL). A negative result must be combined with clinical observations, patient history, and epidemiological information. The expected result is Negative.  Fact Sheet for Patients:  EntrepreneurPulse.com.au  Fact Sheet for  Healthcare Providers:  IncredibleEmployment.be  This test is no t yet approved or cleared by the [place] FDA and  has been authorized for detection and/or diagnosis of SARS-CoV-2 by FDA under an Emergency Use Authorization (EUA). This EUA will remain  in effect (meaning this test can be used) for the duration of the COVID-19 declaration under Section 564(b)(1) of the Act, 21 U.S.C.section 360bbb-3(b)(1), unless the authorization is terminated  or revoked sooner.       Influenza A by PCR NEGATIVE NEGATIVE Final   Influenza B by PCR NEGATIVE NEGATIVE Final    Comment: (NOTE) The Xpert Xpress SARS-CoV-2/FLU/RSV plus assay is intended as an aid in the diagnosis of influenza from Nasopharyngeal swab specimens and should not be used as a sole basis for treatment. Nasal washings and aspirates are unacceptable for Xpert Xpress SARS-CoV-2/FLU/RSV testing.  Fact Sheet for Patients: EntrepreneurPulse.com.au  Fact Sheet for Healthcare Providers: IncredibleEmployment.be  This test is not yet approved or cleared by the [place] FDA and has been authorized for detection and/or diagnosis of SARS-CoV-2 by FDA under an Emergency Use Authorization (EUA). This EUA will remain in effect (meaning this test can be used) for the duration of the COVID-19 declaration under Section 564(b)(1) of the Act, 21 U.S.C. section 360bbb-3(b)(1), unless the authorization is terminated or revoked.  Performed at [facility], [address] [place], [place] [address]   Resp Panel by RT-PCR (Flu A&B, Covid) Nasopharyngeal Swab     Status: None   Collection Time: 04/21/21  5:34 PM   Specimen: Nasopharyngeal Swab; Nasopharyngeal(NP) swabs in vial transport medium  Result Value Ref Range Status   SARS Coronavirus 2 by RT PCR NEGATIVE NEGATIVE Final    Comment: (NOTE) SARS-CoV-2 target nucleic acids are NOT DETECTED.  The SARS-CoV-2 RNA is generally  detectable in upper respiratory specimens during the acute phase of infection. The lowest concentration of SARS-CoV-2 viral copies this assay can detect is 138 copies/mL. A negative result does not preclude SARS-Cov-2 infection and should not be used as the sole basis for treatment or other patient management decisions. A negative result may occur with  improper specimen collection/handling, submission of specimen other than nasopharyngeal swab, presence of viral mutation(s) within the areas targeted by this assay, and inadequate number of viral copies(<138 copies/mL). A negative result must be combined with clinical observations, patient history, and epidemiological information. The expected result is Negative.  Fact Sheet for Patients:  EntrepreneurPulse.com.au  Fact Sheet for Healthcare Providers:  IncredibleEmployment.be  This test is no t yet approved or cleared by the [place] FDA and  has been authorized for detection and/or diagnosis of SARS-CoV-2 by FDA under an Emergency Use Authorization (EUA). This EUA will remain  in effect (meaning  this test can be used) for the duration of the COVID-19 declaration under Section 564(b)(1) of the Act, 21 U.S.C.section 360bbb-3(b)(1), unless the authorization is terminated  or revoked sooner.       Influenza A by PCR NEGATIVE NEGATIVE Final   Influenza B by PCR NEGATIVE NEGATIVE Final    Comment: (NOTE) The Xpert Xpress SARS-CoV-2/FLU/RSV plus assay is intended as an aid in the diagnosis of influenza from Nasopharyngeal swab specimens and should not be used as a sole basis for treatment. Nasal washings and aspirates are unacceptable for Xpert Xpress SARS-CoV-2/FLU/RSV testing.  Fact Sheet for Patients: EntrepreneurPulse.com.au  Fact Sheet for Healthcare Providers: IncredibleEmployment.be  This test is not yet approved or cleared by the [place] FDA  and has been authorized for detection and/or diagnosis of SARS-CoV-2 by FDA under an Emergency Use Authorization (EUA). This EUA will remain in effect (meaning this test can be used) for the duration of the COVID-19 declaration under Section 564(b)(1) of the Act, 21 U.S.C. section 360bbb-3(b)(1), unless the authorization is terminated or revoked.  Performed at [facility], [address] [place], [place] [address]     Radiology Studies: No results found. Scheduled Meds: . Chlorhexidine Gluconate Cloth  6 each Topical Daily  . diltiazem  240 mg Oral Daily  . enoxaparin (LOVENOX) injection  140 mg Subcutaneous Q12H  . loratadine  10 mg Oral Daily  . pantoprazole  40 mg Oral Daily  . risperiDONE  2 mg Oral QHS   Continuous Infusions:   LOS: 4 days   Roxan Hockey, MD How to contact the [facility] Attending or Consulting provider [place] or covering provider during after hours [place], for this patient?  1. Check the care team in [facility] and look for a) attending/consulting TR[facility] provider listed and b) the [facility] team listed 2. Log into [email or web address] and use [place]'s universal password to access. If you do not have the password, please contact the hospital operator. 3. Locate the [facility] provider you are looking for under [facility] and page to a number that you can be directly reached. 4. If you still have difficulty reaching the provider, please page the [facility] (Director on Call) for the Hospitalists listed on amion for assistance.  04/25/2021, 10:23 AM

## 2021-04-25 NOTE — Plan of Care (Signed)

## 2021-04-25 NOTE — Care Management Important Message (Signed)
Important Message  Patient Details  Name: Megan Oneal MRN: [id number] Date of Birth: 11/10/1956   Medicare Important Message Given:  Yes     Tommy Medal 04/25/2021, 12:00 PM

## 2021-04-25 NOTE — Plan of Care (Signed)

## 2021-04-26 DIAGNOSIS — I2692 Saddle embolus of pulmonary artery without acute cor pulmonale: Secondary | ICD-10-CM | POA: Diagnosis not present

## 2021-04-26 DIAGNOSIS — I2602 Saddle embolus of pulmonary artery with acute cor pulmonale: Principal | ICD-10-CM

## 2021-04-26 DIAGNOSIS — I82403 Acute embolism and thrombosis of unspecified deep veins of lower extremity, bilateral: Secondary | ICD-10-CM | POA: Diagnosis not present

## 2021-04-26 DIAGNOSIS — I10 Essential (primary) hypertension: Secondary | ICD-10-CM | POA: Diagnosis not present

## 2021-04-26 LAB — PROTIME-INR
INR: 1.9 — ABNORMAL HIGH (ref 0.8–1.2)
Prothrombin Time: 21.9 seconds — ABNORMAL HIGH (ref 11.4–15.2)

## 2021-04-26 LAB — GLUCOSE, CAPILLARY
Glucose-Capillary: 114 mg/dL — ABNORMAL HIGH (ref 70–99)
Glucose-Capillary: 121 mg/dL — ABNORMAL HIGH (ref 70–99)

## 2021-04-26 MED ORDER — POTASSIUM CHLORIDE ER 10 MEQ PO TBCR: 10.0000 meq | EXTENDED_RELEASE_TABLET | Freq: Every day | ORAL | 2 refills | Status: DC

## 2021-04-26 MED ORDER — ELIQUIS DVT/PE STARTER PACK 5 MG PO TBPK: ORAL_TABLET | ORAL | 0 refills | Status: DC

## 2021-04-26 MED ORDER — LOSARTAN POTASSIUM 25 MG PO TABS: 25.0000 mg | ORAL_TABLET | Freq: Every day | ORAL | 2 refills | Status: DC

## 2021-04-26 MED ORDER — DILTIAZEM HCL ER COATED BEADS 360 MG PO CP24: 360.0000 mg | ORAL_CAPSULE | Freq: Every day | ORAL | 2 refills | Status: DC

## 2021-04-26 MED ORDER — DEXLANSOPRAZOLE 60 MG PO CPDR: 60.0000 mg | DELAYED_RELEASE_CAPSULE | Freq: Every day | ORAL | 4 refills | Status: DC

## 2021-04-26 MED ORDER — APIXABAN 5 MG PO TABS: 5.0000 mg | ORAL_TABLET | Freq: Two times a day (BID) | ORAL | 5 refills | Status: DC

## 2021-04-26 NOTE — Discharge Instructions (Signed)
1)Re-Start apixaban/Eliquis 2 tablets (5 mg x 2) twice daily for the next 7 days, through Friday, 05/02/2021 inclusive -On Saturday, 05/03/2021 you will take just 1 tablet (5 mg) twice a day continuously for at least the next 6 months  2)You are taking Apixaban/Eliquis which is a blood thinner so please Avoid ibuprofen/Advil/Aleve/Motrin/Goody Powders/Naproxen/BC powders/Meloxicam/Diclofenac/Indomethacin and other Nonsteroidal anti-inflammatory medications as these will make you more likely to bleed and can cause stomach ulcers, can also cause Kidney problems.   3) please increase Cardizem CD to 360 mg daily for better blood pressure and heart rate control  4) you need repeat CBC and repeat BMP blood test with your primary care physician Dr. Sharilyn Sites, MD--- within the next 1 week  5) Home health physical therapy will come to the house to help you get stronger/rehab  6) please call if any nosebleeds, blood in your stool, blood in your urine or dark stools or any other concerns about bleeding

## 2021-04-26 NOTE — TOC Transition Note (Signed)
Transition of Care [facility]) - CM/SW Discharge Note   Patient Details  Name: Megan Oneal MRN: [id number] Date of Birth: [birth year]  Transition of Care [facility]) CM/SW Contact:  Natasha Bence, LCSW Phone Number: 04/26/2021, 11:43 AM   Clinical Narrative:    CSW notified of patient's readiness for discharge. CSW notified Marjory Lies with [facility]. Marjory Lies agreeable to resume services. TOC signing off.   Final next level of care: [place] Barriers to Discharge: Barriers Resolved   Patient Goals and CMS Choice Patient states their goals for this hospitalization and ongoing recovery are:: Return home with [facility] CMS Medicare.gov Compare Post Acute Care list provided to:: Patient Choice offered to / list presented to : Patient  Discharge Placement                    Patient and family notified of of transfer: 04/26/21  Discharge Plan and Services                DME Arranged: N/A DME Agency: NA       HH Arranged: RN,PT,Nurse's Aide [place] Agency: [facility] [facility] [employer] Date [place]: 04/26/21 Time [place]: 1143 Representative spoke with at [place]: [place] ([place]) Interventions     Readmission Risk Interventions Readmission Risk Prevention Plan 04/24/2021  Transportation Screening Complete  Medication Review Press photographer) Complete  PCP or Specialist appointment within 3-5 days of discharge Complete  HRI or [place] Complete  SW Recovery Care/Counseling Consult Complete  [place] Not Applicable  Some recent data might be hidden

## 2021-04-26 NOTE — Discharge Summary (Signed)
Megan Oneal, is a 65 y.o. female  DOB Jun 25, 1956  MRN [id number].  Admission date:  04/21/2021  Admitting Physician  Bethena Roys, MD  Discharge Date:  04/26/2021   Primary MD  Sharilyn Sites, MD  Recommendations for primary care physician for things to follow:   1)Re-Start apixaban/Eliquis 2 tablets (5 mg x 2) twice daily for the next 7 days, through Friday, 05/02/2021 inclusive -On Saturday, 05/03/2021 you will take just 1 tablet (5 mg) twice a day continuously for at least the next 6 months  2)You are taking Apixaban/Eliquis which is a blood thinner so please Avoid ibuprofen/Advil/Aleve/Motrin/Goody Powders/Naproxen/BC powders/Meloxicam/Diclofenac/Indomethacin and other Nonsteroidal anti-inflammatory medications as these will make you more likely to bleed and can cause stomach ulcers, can also cause Kidney problems.   3) please increase Cardizem CD to 360 mg daily for better blood pressure and heart rate control  4) you need repeat CBC and repeat BMP blood test with your primary care physician Dr. Sharilyn Sites, MD--- within the next 1 week  5) Home health physical therapy will come to the house to help you get stronger/rehab  6) please call if any nosebleeds, blood in your stool, blood in your urine or dark stools or any other concerns about bleeding    Admission Diagnosis  Shortness of breath [R06.02] Pulmonary embolism (HCC) [I26.99]   Discharge Diagnosis  Shortness of breath [R06.02] Pulmonary embolism ([place]) [I26.99]    Principal Problem:   Saddle pulmonary embolus with Rt Heart Strain -Bil PEs/extensive occlusive thrombus of Both LE as well Active Problems:   Pulmonary embolus (HCC)   Leg DVT (deep venous thromboembolism), acute, bilateral -extensive occlusive thrombus Bil- 03/2021   Hypertension   Diabetes ([place])      Past Medical History:  Diagnosis Date  . Anxiety 03/12/2013  .  Degenerative disc disease, lumbar    Epidural injections in 2007  . Diabetes mellitus without complication ([place])   . Endometriosis   . Frequent PVCs    12,000/24 hours  . Hypertension   . Otalgia, unspecified   . Palpitations 10/04/2010  . Vascular bruit 01/2013   Right neck    Past Surgical History:  Procedure Laterality Date  . BREAST SURGERY     benign tumor excised-age 51  . CARDIOVASCULAR STRESS TEST  03/13/2008   No scintigraphic evidence of inducible myocardial ischemia, EKG negative for ischemia, no ECG changes.  . COLONOSCOPY  2009  . COLONOSCOPY N/A 09/21/2019   Procedure: COLONOSCOPY;  Surgeon: Rogene Houston, MD;  [facility]: [facility];  Service: Endoscopy;  Laterality: N/A;  2:00-10:30  . CYST EXCISION  2007   Scalp  . DOPPLER ECHOCARDIOGRAPHY  03/13/2008   EF [id number], LV systolic function is normal, LV wall function is normal  . LAPAROSCOPIC CHOLECYSTECTOMY  2010  . LUMBAR LAMINECTOMY  2009    L4-L5 and L5-S1 laminectomy with diskectomy; transforaminal  . POLYPECTOMY  09/21/2019   Procedure: POLYPECTOMY;  Surgeon: Rogene Houston, MD;  Location: [facility];  Service:  Endoscopy;;  . REPLACEMENT TOTAL KNEE    . SLEEP STUDY  10/29/2009   AHI-1.8/hr, during REM 6.4/hr; RDI-8.0/hr, during REM 15.0/hr; avg oxygen during REM and NREM was 94%  . TOTAL KNEE ARTHROPLASTY Bilateral   . TUBAL LIGATION       HPI  from the history and physical done on the day of admission:   Chief Complaint: Difficulty breathing  HPI: Megan Oneal is a 65 y.o. female with medical history significant for hypertension, obesity, diabetes mellitus, recent hospitalization for pulmonary embolism. Patient presented to the ED with complaints of worsening difficulty breathing yesterday last night.  She denies chest pain, she reports her lower extremity swelling has improved.  Patient tells me she has not missed any doses of her Eliquis since discharge.    Recent hospitalization 4/26-4/29 -saddle  type PE with right heart strain, at least submassive PE.  Bilateral lower extremity venous Doppler showed extensive occlusive DVT on the right, and isolated acute occlusive DVT on the left.  Treated with IV heparin drip and fusion while admitted and discharged on Eliquis.  She did not require supplemental O2 on discharge.  ED Course: Tachycardic to 121, tachypneic to 24, blood pressure systolic [id number] to [id number].  O2 sats 93 to 96% on room air.  Repeat CTA redemonstrated saddle pulmonary embolus with large burden of bilateral low back and more distal embolus bilaterally, with significantly increased burden of embolus bilaterally. EDP talked with Dr. Delton Coombes, recommended Lovenox bridging to Coumadin and she had not missed any Eliquis doses, recommended admission, and repeat echo. EDP talked to critical care Dr. Melvyn Novas, no intervention needed, okay to admit here.    Hospital Course:   Assessment & Plan:   Principal Problem:   Pulmonary embolus (HCC) Active Problems:   Hypertension   Diabetes ([place])   1)Acute saddle pulmonary embolus and bilateral clots and DVT -  -Very very heavy clot burden (please see CTA chest from 04/15/2021 and repeat CTA chest from 04/21/2021 as well as lower extremity venous Dopplers from 04/16/2021) -Echo with right heart strain -Pt previously discharged on 04/18/21 on Apixaban starter pack, had initially started Apixaban in hospital after 30 hours of IV heparin infusion  - Pt reported she did Not miss any doses of apixaban since being at home.   -Readmitted 04/21/2021 with symptomatic thromboembolic -Treated with  Lovenox for at least 5 days and Coumadin, and she will need to overlap with 2 consecutive days of therapeutic INR while on Coumadin -Given the difficulties and logistics with Coumadin it is practical to discharge patient on Eliquis -This is probably not an Eliquis failure per se -Please note that the patient had very heavy clot burden, initially she also received IV  heparin for just 30 hours, prior to being transitioned to apixaban and discharged -Again this may Not be on apixaban failure per se patient may not have done well just due to the heavy clot burden associated with bilateral lower extremity DVTs and bilateral PE/symptomatic saddle embolism -Patient will probably need lifelong anticoagulation or extensive hypercoagulability work-up prior to discontinuing anticoagulation in the future -I would lean towards lifelong anticoagulation -Need to rule out underlying malignancy with further outpatient malignancy/healthcare maintenance screening -Very symptomatic Saddle pulmonary embolism, high risk/increased mortality associated with right heart strain, need for anticoagulation for at least 5 days, and  overlapping with therapeutic INR for at least 2 consecutive days prior to discharge-- discharge home on 04/26/2021  on Eliquis   2)History of recent hospitalization for auditory  hallucinations - she has been stable on risperdal --  3)Acute Anemia--- in the setting of extensive clots and recently initiated full anticoagulation - Hgb is stable around 11, monitor closely and transfuse if clinically indicated  4)Pre-Diabetes--A1c from 03/29/2021 is 6.4 -Dietary modifications advised  5)Morbid Obesity- -Low calorie diet, portion control and increase physical activity discussed with patient -Body mass index is 48.14 kg/m.   6)HTN--BP and heart rate not at goal, increase Cardizem CD to 300 mg daily, add losartan 25 mg daily for better BP control  7) generalized weakness and deconditioning----PT eval appreciated and recommended home health  Code Status: full  Family Communication:  Discussed with patient's sister  disposition: home    Dispo: The patient is from: Home  Anticipated d/c is to: Home   Consultants:   Hematologist Dr. Delton Coombes  Procedures:   echo   Discharge Condition: stable  Follow UP   Follow-up  Information    Sharilyn Sites, MD. Schedule an appointment as soon as possible for a visit in 5 day(s).   Specialty: Family Medicine Why: Repeat CBC and BMP Blood test Contact information: [address] [place] [address] [phone]        Arnoldo Lenis, MD .   Specialty: Cardiology Contact information: [place] [address] [phone]                Diet and Activity recommendation:  As advised  Discharge Instructions    Discharge Instructions    Call MD for:  difficulty breathing, headache or visual disturbances   Complete by: As directed    Call MD for:  persistant dizziness or light-headedness   Complete by: As directed    Call MD for:  persistant nausea and vomiting   Complete by: As directed    Call MD for:  severe uncontrolled pain   Complete by: As directed    Call MD for:  temperature >100.4   Complete by: As directed    Diet - low sodium heart healthy   Complete by: As directed    Discharge instructions   Complete by: As directed    1)Re-Start apixaban/Eliquis 2 tablets (5 mg x 2) twice daily for the next 7 days, through Friday, 05/02/2021 inclusive -On Saturday, 05/03/2021 you will take just 1 tablet (5 mg) twice a day continuously for at least the next 6 months  2)You are taking Apixaban/Eliquis which is a blood thinner so please Avoid ibuprofen/Advil/Aleve/Motrin/Goody Powders/Naproxen/BC powders/Meloxicam/Diclofenac/Indomethacin and other Nonsteroidal anti-inflammatory medications as these will make you more likely to bleed and can cause stomach ulcers, can also cause Kidney problems.   3) please increase Cardizem CD to 360 mg daily for better blood pressure and heart rate control  4) you need repeat CBC and repeat BMP blood test with your primary care physician Dr. Sharilyn Sites, MD--- within the next 1 week  5) Home health physical therapy will come to the house to help you get stronger/rehab  6) please  call if any nosebleeds, blood in your stool, blood in your urine or dark stools or any other concerns about bleeding   Face-to-face encounter (required for Medicare/Medicaid patients)   Complete by: As directed    I Mariel Gaudin certify that this patient is under my care and that I, or a nurse practitioner or physician's assistant working with me, had a face-to-face encounter that meets the physician face-to-face encounter requirements with this patient on 04/26/2021. The encounter with the patient was in  whole, or in part for the following medical condition(s) which is the primary reason for home health care (List medical condition):   -Bilateral DVTs and PEs with generalized weakness and deconditioning   The encounter with the patient was in whole, or in part, for the following medical condition, which is the primary reason for home health care: Bilateral PE and DVTs with generalized weakness and deconditioning   I certify that, based on my findings, the following services are medically necessary home health services:  Nursing Physical therapy     Reason for Medically Necessary Home Health Services: Skilled Nursing- Change/Decline in Patient Status   My clinical findings support the need for the above services: Shortness of breath with activity   Further, I certify that my clinical findings support that this patient is homebound due to: Shortness of Breath with activity   Home Health   Complete by: As directed    To provide the following care/treatments:  PT RN     Increase activity slowly   Complete by: As directed         Discharge Medications     Allergies as of 04/26/2021      Reactions   Metoprolol Other (See Comments)   Fatigue and malaise; wheezing   Sulfa Antibiotics       Medication List    TAKE these medications   acetaminophen 325 MG tablet Commonly known as: TYLENOL Take 2 tablets (650 mg total) by mouth every 6 (six) hours as needed for mild pain (or Fever >/=  101).   cholecalciferol 25 MCG (1000 UNIT) tablet Commonly known as: VITAMIN D3 Take 1,000 Units by mouth daily.   dexlansoprazole 60 MG capsule Commonly known as: DEXILANT Take 1 capsule (60 mg total) by mouth daily.   diltiazem 360 MG 24 hr capsule Commonly known as: Cardizem CD Take 1 capsule (360 mg total) by mouth daily. What changed:   medication strength  how much to take   Eliquis DVT/PE Starter Pack Generic drug: Apixaban Starter Pack (10mg  and 5mg ) Take as directed on package: start with two-5mg  tablets twice daily for 7 days. On day 8, switch to one-5mg  tablet twice daily. -Start this on Saturday, Apr 26, 2021 What changed: additional instructions   apixaban 5 MG Tabs tablet Commonly known as: ELIQUIS Take 1 tablet (5 mg total) by mouth 2 (two) times daily. Start this around 24 May 2021 after you finish the initial starter pack Start taking on: May 24, 2021 What changed: You were already taking a medication with the same name, and this prescription was added. Make sure you understand how and when to take each.   ferrous sulfate 325 (65 FE) MG tablet Take 325 mg by mouth daily with breakfast.   fluticasone 50 MCG/ACT nasal spray Commonly known as: FLONASE Place 2 sprays into both nostrils daily as needed for allergies.   furosemide 40 MG tablet Commonly known as: LASIX Take 40 mg by mouth every other day.   ipratropium 0.03 % nasal spray Commonly known as: ATROVENT Place 2 sprays into both nostrils 3 (three) times daily as needed for rhinitis.   levocetirizine 5 MG tablet Commonly known as: XYZAL Take 5 mg by mouth daily.   losartan 25 MG tablet Commonly known as: COZAAR Take 1 tablet (25 mg total) by mouth daily. For BP Start taking on: Apr 27, 2021   polyethylene glycol powder 17 GM/SCOOP powder Commonly known as: GLYCOLAX/MIRALAX 1 scoop daily or as needed   risperiDONE  2 MG tablet Commonly known as: RISPERDAL Take 1 tablet (2 mg total) by mouth  at bedtime.   vitamin C 500 MG tablet Commonly known as: ASCORBIC ACID Take 500 mg by mouth daily.       Major procedures and Radiology Reports - PLEASE review detailed and final reports for all details, in brief -   DG Chest 1 View  Result Date: 03/28/2021 CLINICAL DATA:  Hypoxia.  Central catheter placement EXAM: CHEST  1 VIEW COMPARISON:  March 28, 2021 study obtained earlier in the day FINDINGS: Central catheter tip is at the cavoatrial junction. Endotracheal tube tip is 3.1 cm above the carina. Nasogastric tube tip and side port in stomach. No pneumothorax. There is mild left base atelectasis. The lungs elsewhere are clear. Heart is enlarged with pulmonary vascularity within normal limits. No adenopathy. No bone lesions. IMPRESSION: Tube and catheter positions as described without pneumothorax. No edema or consolidation. Left base atelectasis. There is cardiomegaly. No adenopathy. Electronically Signed   By: Lowella Grip III M.D.   On: 03/28/2021 13:11   DG Chest 2 View  Result Date: 04/15/2021 CLINICAL DATA:  Shortness of breath. EXAM: CHEST - 2 VIEW COMPARISON:  03/28/2021 FINDINGS: Heart size is normal. No pleural effusion or edema identified. No airspace densities identified. Mild spondylosis in the thoracic spine. Review of the visualized osseous structures are unremarkable. IMPRESSION: No active cardiopulmonary abnormalities. Electronically Signed   By: Kerby Moors M.D.   On: 04/15/2021 16:23   CT Angio Chest PE W/Cm &/Or Wo Cm  Result Date: 04/21/2021 CLINICAL DATA:  Recent diagnosis pulmonary embolism, currently anticoagulated, worsening shortness of breath EXAM: CT ANGIOGRAPHY CHEST WITH CONTRAST TECHNIQUE: Multidetector CT imaging of the chest was performed using the standard protocol during bolus administration of intravenous contrast. Multiplanar CT image reconstructions and MIPs were obtained to evaluate the vascular anatomy. CONTRAST:  174mL OMNIPAQUE IOHEXOL 350 MG/ML  SOLN COMPARISON:  04/15/2021 FINDINGS: Cardiovascular: Satisfactory opacification of the pulmonary arteries to the segmental level. Redemonstrated saddle embolus with a large burden of bilateral lobar and more distal embolus. There is a significantly increased burden of embolus bilaterally, although particularly in the left upper lobe. Global cardiomegaly, with gross enlargement of the RV LV ratio, greater than 1.6-1. No pericardial effusion. The main pulmonary artery is enlarged, measuring up to 3.8 cm in caliber. Mediastinum/Nodes: No enlarged mediastinal, hilar, or axillary lymph nodes. Thyroid gland, trachea, and esophagus demonstrate no significant findings. Lungs/Pleura: Lungs are clear. No pleural effusion or pneumothorax. Upper Abdomen: No acute abnormality. Musculoskeletal: No chest wall abnormality. No acute or significant osseous findings. Review of the MIP images confirms the above findings. IMPRESSION: 1. Redemonstrated saddle pulmonary embolus with a large burden of bilateral lobar and more distal embolus throughout all lobes of the lungs. There is a significantly increased burden of embolus bilaterally, although particularly in the left upper lobe. 2. Global cardiomegaly, with gross enlargement of the RV LV ratio, greater than 1.6-1. The main pulmonary artery is enlarged, measuring up to 3.8 cm in caliber. Findings are similar to prior examination and concerning for right heart strain. Electronically Signed   By: Eddie Candle M.D.   On: 04/21/2021 15:37   CT Angio Chest PE W/Cm &/Or Wo Cm  Result Date: 04/15/2021 CLINICAL DATA:  PE suspected high probability, shortness of breath x2 days. EXAM: CT ANGIOGRAPHY CHEST WITH CONTRAST TECHNIQUE: Multidetector CT imaging of the chest was performed using the standard protocol during bolus administration of intravenous contrast. Multiplanar CT  image reconstructions and MIPs were obtained to evaluate the vascular anatomy. CONTRAST:  155mL OMNIPAQUE IOHEXOL  350 MG/ML SOLN COMPARISON:  Chest radiograph from same day and March 28, 2021. FINDINGS: Cardiovascular: Satisfactory opacification of the pulmonary arteries to the segmental level. Saddle type pulmonary embolus at the takeoff of the bilateral main pulmonary artery branches with extension into the bilateral lobar and segmental pulmonary arteries. The right ventricular to left ventricular ratio is 1.5 with enlargement of the main pulmonary artery measuring 3.2 cm as well as reflux of contrast into the IVC and hepatic veins. Right-sided cardiac enlargement. No suspicious intracardiac filling defect visualized. No pericardial effusion. A few calcified aortic atherosclerotic plaques. No thoracic aortic aneurysm. Mediastinum/Nodes: Hypodense 8 mm nodule in the left lobe of the thyroid Not clinically significant; no follow-up imaging recommended (ref: J Am Coll Radiol. 2015 Feb;12(2): 143-50).No pathologically enlarged mediastinal, hilar or axillary lymph nodes. Trachea esophagus are grossly unremarkable. Lungs/Pleura: Lungs are clear. No pleural effusion or pneumothorax. Upper Abdomen: No acute abnormality. Musculoskeletal: No chest wall abnormality. No acute or significant osseous findings. Review of the MIP images confirms the above findings. Critical Value/emergent results were called by telephone at the time of interpretation on 04/15/2021 at 6:28 pm to provider Dr Roderic Palau , who verbally acknowledged these results. IMPRESSION: 1. Sinal type pulmonary embolus with CT evidence of right heart strain (RV/LV Ratio 1.5) consistent with at least submassive (intermediate risk) PE. The presence of right heart strain has been associated with an increased risk of morbidity and mortality. 2. Aortic atherosclerosis.  Aortic Atherosclerosis (ICD10-I70.0). Critical Value/emergent results were called by telephone at the time of interpretation on 04/15/2021 at 6:28 pm to provider Dr Roderic Palau , who verbally acknowledged these results.  Electronically Signed   By: Dahlia Bailiff MD   On: 04/15/2021 18:34   MR BRAIN WO CONTRAST  Result Date: 03/27/2021 CLINICAL DATA:  Mental status change. EXAM: MRI HEAD WITHOUT CONTRAST TECHNIQUE: Multiplanar, multiecho pulse sequences of the brain and surrounding structures were obtained without intravenous contrast. COMPARISON:  Head CT March 26, 2021 FINDINGS: Brain: No acute infarction, hemorrhage, hydrocephalus, extra-axial collection or mass lesion. A few small foci of T2 hyperintensity are seen within the white matter of the cerebral hemispheres, nonspecific. Vascular: Normal flow voids. Skull and upper cervical spine: Normal marrow signal. Sinuses/Orbits: Negative. IMPRESSION: 1. No acute intracranial abnormality. 2. A few small foci of T2 hyperintensity within the white matter of the cerebral hemispheres, nonspecific but may be seen with chronic microvascular ischemic changes. Electronically Signed   By: Pedro Earls M.D.   On: 03/27/2021 14:43   US Venous Img Lower Bilateral (DVT)  Result Date: 04/16/2021 CLINICAL DATA:  Pulmonary embolus identified yesterday. Assess for residual DVT. EXAM: BILATERAL LOWER EXTREMITY VENOUS DOPPLER ULTRASOUND TECHNIQUE: Gray-scale sonography with graded compression, as well as color Doppler and duplex ultrasound were performed to evaluate the lower extremity deep venous systems from the level of the common femoral vein and including the common femoral, femoral, profunda femoral, popliteal and calf veins including the posterior tibial, peroneal and gastrocnemius veins when visible. The superficial great saphenous vein was also interrogated. Spectral Doppler was utilized to evaluate flow at rest and with distal augmentation maneuvers in the common femoral, femoral and popliteal veins. COMPARISON:  None. FINDINGS: RIGHT LOWER EXTREMITY Common Femoral Vein: Patent and compressible in the proximal and mid segment. Thrombus does extend from the femoral  vein just into the distal common femoral vein. Saphenofemoral Junction: No evidence of  thrombus. Normal compressibility and flow on color Doppler imaging. Profunda Femoral Vein: Nonocclusive thrombus is present within the profunda femoral vein. Femoral Vein: The femoral vein is not compressible. The lumen is filled with low-level internal echoes. No evidence of color flow on color Doppler imaging. Findings are consistent with occlusive thrombus. The thrombus extends just into the distal common femoral vein. Popliteal Vein: Occlusive thrombus extends into the popliteal vein. Calf Veins: Occlusive thrombus extends into the calf veins. Superficial Great Saphenous Vein: No evidence of thrombus. Normal compressibility. Venous Reflux:  None. Other Findings:  None. LEFT LOWER EXTREMITY Common Femoral Vein: No evidence of thrombus. Normal compressibility, respiratory phasicity and response to augmentation. Saphenofemoral Junction: No evidence of thrombus. Normal compressibility and flow on color Doppler imaging. Profunda Femoral Vein: No evidence of thrombus. Normal compressibility and flow on color Doppler imaging. Femoral Vein: No evidence of thrombus. Normal compressibility, respiratory phasicity and response to augmentation. Popliteal Vein: No evidence of thrombus. Normal compressibility, respiratory phasicity and response to augmentation. Calf Veins: The posterior tibial veins are expanded and contain low-level internal echoes. No evidence of color flow on color Doppler imaging. Findings are consistent with acute occlusive thrombus. Superficial Great Saphenous Vein: No evidence of thrombus. Normal compressibility. Venous Reflux:  None. Other Findings:  None. IMPRESSION: 1. Positive for extensive occlusive thrombus within the RIGHT lower extremity extending from the calf veins through the femoral vein and just into the common femoral vein at the bifurcation. 2. Positive for isolated acute occlusive calf vein DVT in the  LEFT posterior tibial veins. Electronically Signed   By: Jacqulynn Cadet M.D.   On: 04/16/2021 10:40   DG Chest Port 1 View  Result Date: 04/21/2021 CLINICAL DATA:  Shortness of breath EXAM: PORTABLE CHEST 1 VIEW COMPARISON:  04/15/2021 FINDINGS: Cardiac shadow is enlarged but somewhat accentuated by the portable technique. The lungs are clear bilaterally. No focal infiltrate is seen. Mild central vascular congestion is noted without edema. IMPRESSION: Mild central vascular congestion without parenchymal edema. No other focal abnormality is noted. Electronically Signed   By: Inez Catalina M.D.   On: 04/21/2021 09:30   DG Chest Port 1 View  Result Date: 03/28/2021 CLINICAL DATA:  Acute respiratory failure with hypoxia. EXAM: PORTABLE CHEST 1 VIEW COMPARISON:  03/26/2021 FINDINGS: Endotracheal and enteric tubes are unchanged in position. Shallow inspiration. Mild cardiac enlargement. Prominent pulmonary vascularity with bilateral basilar atelectasis. No definite pleural effusion or pneumothorax. Degenerative changes in the spine. IMPRESSION: Shallow inspiration with bilateral basilar atelectasis. Cardiac enlargement with pulmonary vascular congestion, similar to prior study. Electronically Signed   By: Lucienne Capers M.D.   On: 03/28/2021 01:21   EEG adult  Result Date: 04/02/2021 Theodosia Blender, MD     04/02/2021  8:02 PM [facility] EEG HISTORY:  65 year old female presents with altered mental status. INTRODUCTION:  A digital EEG was performed in the laboratory using the standard international 10/20 system of electrode placement in addition to one channel of EKG monitoring.  Hyperventilation was not performed. Photic Stimulation was performed.  This tracing captures wakefulness through drowsiness. This EEG was recording for about 20 minutes. DESCRIPTION OF RECORD:  In the most alert state the alpha rhythm is 9 Hz in frequency, which is seen in the occipital region and attenuates  with eye opening and is bilaterally synchronous and symmetrical.  No focal slowing, sharp waves, or epileptiform discharges are seen. Photic stimulation was performed and did not produce any abnormalities. Heart rate was irregular  at a rate of 120 bpm. IMPRESSION:  This is a normal adult EEG in the awake and drowsy states. No focal slowing, focal abnormalities, epileptiform discharges, or electrographic seizures are seen.  Of note, a normal EEG does not exclude the diagnosis of seizure disorder.  A repeat sleep deprived EEG or 24 hour EEG could be considered.  The abnormality on single lead EKG could be further evaluated by 12 lead EKG.  Clinical correlation is recommended.   ECHOCARDIOGRAM COMPLETE  Result Date: 04/17/2021    ECHOCARDIOGRAM REPORT   Patient Name:   SEANNE FETTERS Date of Exam: 04/17/2021 Medical Rec #:  [id number]    Height:       66.0 in Accession #:    [id number]   Weight:       295.4 lb Date of Birth:  06-Mar-1956    BSA:          2.360 m Patient Age:    62 years     BP:           123/70 mmHg Patient Gender: F            HR:           111 bpm. Exam Location:  [facility] Procedure: 2D Echo Indications:    Pulmonary Embolus I26.09  History:        Patient has prior history of Echocardiogram examinations, most                 recent 11/28/2020. Arrythmias:PVC; Risk Factors:Hypertension and                 Diabetes.  Sonographer:    Mikki Santee RDCS (AE) Referring Phys: [id number] [facility] [place]  1. Left ventricular ejection fraction, by estimation, is 50 to 55%. The left ventricle has low normal function. The left ventricle has no regional wall motion abnormalities. There is mild left ventricular hypertrophy. Left ventricular diastolic parameters are indeterminate. There is the interventricular septum is flattened in diastole ('D' shaped left ventricle), consistent with right ventricular volume overload.  2. Right ventricular systolic function is moderately reduced. The right  ventricular size is moderately enlarged. There is upper normal pulmonary artery systolic pressure. The estimated right ventricular systolic pressure is Q000111Q mmHg.  3. Right atrial size was mild to moderately dilated.  4. A small pericardial effusion is present. The pericardial effusion is posterior to the left ventricle.  5. The mitral valve is grossly normal. Trivial mitral valve regurgitation.  6. The aortic valve is tricuspid. Aortic valve regurgitation is not visualized.  7. The inferior vena cava is dilated in size with >50% respiratory variability, suggesting right atrial pressure of 8 mmHg. FINDINGS  Left Ventricle: Left ventricular ejection fraction, by estimation, is 50 to 55%. The left ventricle has low normal function. The left ventricle has no regional wall motion abnormalities. The left ventricular internal cavity size was normal in size. There is mild left ventricular hypertrophy. The interventricular septum is flattened in diastole ('D' shaped left ventricle), consistent with right ventricular volume overload. Left ventricular diastolic parameters are indeterminate. Right Ventricle: The right ventricular size is moderately enlarged. No increase in right ventricular wall thickness. Right ventricular systolic function is moderately reduced. There is normal pulmonary artery systolic pressure. The tricuspid regurgitant velocity is 2.62 m/s, and with an assumed right atrial pressure of 8 mmHg, the estimated right ventricular systolic pressure is Q000111Q mmHg. Left Atrium: Left atrial size was normal in size. Right Atrium: Right  atrial size was mild to moderately dilated. Pericardium: A small pericardial effusion is present. The pericardial effusion is posterior to the left ventricle. Mitral Valve: The mitral valve is grossly normal. Trivial mitral valve regurgitation. Tricuspid Valve: The tricuspid valve is grossly normal. Tricuspid valve regurgitation is mild. Aortic Valve: The aortic valve is tricuspid.  Aortic valve regurgitation is not visualized. Pulmonic Valve: The pulmonic valve was grossly normal. Pulmonic valve regurgitation is mild. Aorta: The aortic root is normal in size and structure. Venous: The inferior vena cava is dilated in size with greater than 50% respiratory variability, suggesting right atrial pressure of 8 mmHg. IAS/Shunts: No atrial level shunt detected by color flow Doppler.  LEFT VENTRICLE PLAX 2D LVIDd:         4.32 cm LVIDs:         3.15 cm LV PW:         1.17 cm LV IVS:        1.10 cm LVOT diam:     2.10 cm LV SV:         63 LV SV Index:   27 LVOT Area:     3.46 cm  RIGHT VENTRICLE RV S prime:     10.40 cm/s LEFT ATRIUM             Index       RIGHT ATRIUM           Index LA diam:        3.40 cm 1.44 cm/m  RA Area:     28.00 cm LA Vol (A2C):   41.4 ml 17.54 ml/m RA Volume:   111.00 ml 47.03 ml/m LA Vol (A4C):   46.1 ml 19.53 ml/m LA Biplane Vol: 44.5 ml 18.85 ml/m  AORTIC VALVE LVOT Vmax:   113.00 cm/s LVOT Vmean:  67.800 cm/s LVOT VTI:    0.183 m  AORTA Ao Root diam: 3.20 cm TRICUSPID VALVE TR Peak grad:   27.5 mmHg TR Vmax:        262.00 cm/s  SHUNTS Systemic VTI:  0.18 m Systemic Diam: 2.10 cm Rozann Lesches MD Electronically signed by Rozann Lesches MD Signature Date/Time: 04/17/2021/11:54:58 AM    Final    ECHOCARDIOGRAM LIMITED  Result Date: 04/22/2021    ECHOCARDIOGRAM LIMITED REPORT   Patient Name:   VERNISHA BACOTE Date of Exam: 04/22/2021 Medical Rec #:  [id number]    Height:       66.0 in Accession #:    [id number]   Weight:       296.1 lb Date of Birth:  01-13-1956    BSA:          2.363 m Patient Age:    28 years     BP:           126/80 mmHg Patient Gender: F            HR:           109 bpm. Exam Location:  [facility] Procedure: Limited Echo, Cardiac Doppler and Color Doppler Indications:    I26.02 Pulmonary embolus  History:        Patient has prior history of Echocardiogram examinations, most                 recent 04/17/2021. Risk Factors:Hypertension and Diabetes.                  Pulmonary embolus.  Sonographer:    Roseanna Rainbow RDCS (AE) Referring Phys: [phone]  Bethena Roys  Sonographer Comments: Patient is morbidly obese. Image acquisition challenging due to patient body habitus. IMPRESSIONS  1. Left ventricular ejection fraction, by estimation, is 65 to 70%. The left ventricle has normal function.  2. The ventricular septum is flattened in systole consistent with RV pressure overload. Relatively preserved motion of the RV anulus, RV TAPSE and S' were not performed. The RV free wall appears hypokinetic. Overall moderate RV dysfunction. . The right ventricular size is moderately enlarged.  3. The pericardial effusion is circumferential. There is no evidence of cardiac tamponade.  4. The aortic valve is tricuspid. FINDINGS  Left Ventricle: Left ventricular ejection fraction, by estimation, is 65 to 70%. The left ventricle has normal function. The left ventricular internal cavity size was normal in size. There is no left ventricular hypertrophy. Right Ventricle: The ventricular septum is flattened in systole consistent with RV pressure overload. Relatively preserved motion of the RV anulus, RV TAPSE and S' were not performed. The RV free wall appears hypokinetic. Overall moderate RV dysfunction.  The right ventricular size is moderately enlarged. Pericardium: Trivial pericardial effusion is present. The pericardial effusion is circumferential. There is no evidence of cardiac tamponade. Aortic Valve: The aortic valve is tricuspid. Pulmonary Artery: Moderate to severe pulmonary HTN, PASP is 64 mmHg. LEFT VENTRICLE PLAX 2D LVIDd:         4.28 cm LVIDs:         2.46 cm LV PW:         1.38 cm LV IVS:        1.42 cm LVOT diam:     3.20 cm LVOT Area:     8.04 cm  LV Volumes (MOD) LV vol d, MOD A2C: 63.0 ml LV vol d, MOD A4C: 70.1 ml LV vol s, MOD A2C: 24.6 ml LV vol s, MOD A4C: 29.7 ml LV SV MOD A2C:     38.4 ml LV SV MOD A4C:     70.1 ml LV SV MOD BP:      42.1 ml LEFT ATRIUM          Index LA diam:    3.40 cm 1.44 cm/m   AORTA Ao Root diam: 3.00 cm  SHUNTS Systemic Diam: 3.20 cm Carlyle Dolly MD Electronically signed by Carlyle Dolly MD Signature Date/Time: 04/22/2021/2:23:05 PM    Final    DG FL GUIDED LUMBAR PUNCTURE  Result Date: 03/27/2021 CLINICAL DATA:  Acute encephalopathy. EXAM: DIAGNOSTIC LUMBAR PUNCTURE UNDER FLUOROSCOPIC GUIDANCE COMPARISON:  MRI lumbar spine 04/25/2008. FLUOROSCOPY TIME:  Fluoroscopy Time:  0 minutes 54 seconds Radiation Exposure Index (if provided by the fluoroscopic device): 42.9 mGy Number of Acquired Spot Images: 1 PROCEDURE: After discussing the risks and benefits of this procedure with the patient's sister informed consent was obtained. Risk of bleeding, infection, allergy, and need for blood patch discussed with patient's sister. With the patient prone, the lower back was prepped with Betadine. 1% Lidocaine was used for local anesthesia. Lumbar puncture was performed at the L5-S1 level using a 22 gauge needle with return of clear CSF. Opening pressure not obtained due to patient's clinical condition. 8 ml of CSF were obtained for laboratory studies. The patient tolerated the procedure well and there were no apparent complications. IMPRESSION: Successful fluoroscopically directed lumbar puncture. 8 cc of clear CSF obtained and sent to the laboratory for ordered studies. Electronically Signed   By: Marcello Moores  Register   On: 03/27/2021 14:59    Micro Results   Recent Results (from  the past 240 hour(s))  Resp Panel by RT-PCR (Flu A&B, Covid) Nasopharyngeal Swab     Status: None   Collection Time: 04/21/21  5:34 PM   Specimen: Nasopharyngeal Swab; Nasopharyngeal(NP) swabs in vial transport medium  Result Value Ref Range Status   SARS Coronavirus 2 by RT PCR NEGATIVE NEGATIVE Final    Comment: (NOTE) SARS-CoV-2 target nucleic acids are NOT DETECTED.  The SARS-CoV-2 RNA is generally detectable in upper respiratory specimens during the acute phase  of infection. The lowest concentration of SARS-CoV-2 viral copies this assay can detect is 138 copies/mL. A negative result does not preclude SARS-Cov-2 infection and should not be used as the sole basis for treatment or other patient management decisions. A negative result may occur with  improper specimen collection/handling, submission of specimen other than nasopharyngeal swab, presence of viral mutation(s) within the areas targeted by this assay, and inadequate number of viral copies(<138 copies/mL). A negative result must be combined with clinical observations, patient history, and epidemiological information. The expected result is Negative.  Fact Sheet for Patients:  EntrepreneurPulse.com.au  Fact Sheet for Healthcare Providers:  IncredibleEmployment.be  This test is no t yet approved or cleared by the [place] FDA and  has been authorized for detection and/or diagnosis of SARS-CoV-2 by FDA under an Emergency Use Authorization (EUA). This EUA will remain  in effect (meaning this test can be used) for the duration of the COVID-19 declaration under Section 564(b)(1) of the Act, 21 U.S.C.section 360bbb-3(b)(1), unless the authorization is terminated  or revoked sooner.       Influenza A by PCR NEGATIVE NEGATIVE Final   Influenza B by PCR NEGATIVE NEGATIVE Final    Comment: (NOTE) The Xpert Xpress SARS-CoV-2/FLU/RSV plus assay is intended as an aid in the diagnosis of influenza from Nasopharyngeal swab specimens and should not be used as a sole basis for treatment. Nasal washings and aspirates are unacceptable for Xpert Xpress SARS-CoV-2/FLU/RSV testing.  Fact Sheet for Patients: EntrepreneurPulse.com.au  Fact Sheet for Healthcare Providers: IncredibleEmployment.be  This test is not yet approved or cleared by the [place] FDA and has been authorized for detection and/or diagnosis of  SARS-CoV-2 by FDA under an Emergency Use Authorization (EUA). This EUA will remain in effect (meaning this test can be used) for the duration of the COVID-19 declaration under Section 564(b)(1) of the Act, 21 U.S.C. section 360bbb-3(b)(1), unless the authorization is terminated or revoked.  Performed at [facility], [address], [place], [place] [address]        Today   Subjective    Janann Contois today has no new complaints -Ambulating in the room without significant hypoxia        -Denies chest pains or significant dyspnea on exertion, no bleeding issues  Patient has been seen and examined prior to discharge   Objective   Blood pressure (!) 164/80, pulse 98, temperature 97.6 F (36.4 C), temperature source Oral, resp. rate 17, height 5\' 6"  (1.676 m), weight 135.3 kg, SpO2 95 %.   Intake/Output Summary (Last 24 hours) at 04/26/2021 1119 Last data filed at 04/26/2021 [id number] Gross per 24 hour  Intake 360 ml  Output --  Net 360 ml   Exam Gen:- Awake Alert, no acute distress, morbidly obese, speaking in complete sentences HEENT:- [place].AT, No sclera icterus Neck-Supple Neck,No JVD,.  Lungs-improved air movement, no wheezing CV- S1, S2 normal, regular Abd-  +ve B.Sounds, Abd Soft, No tenderness,    Extremity/Skin:-Improving edema,   good pulses Psych-affect  is appropriate, oriented x3 Neuro-generalized weakness, no new focal deficits, no tremors    Data Review   CBC w Diff:  Lab Results  Component Value Date   WBC 6.7 04/25/2021   HGB 11.1 (L) 04/25/2021   HGB 10.5 (L) 03/28/2014   HCT 36.4 04/25/2021   HCT 36.4 03/19/2014   PLT 267 04/25/2021   PLT 266 03/28/2014   LYMPHOPCT 26 04/21/2021   MONOPCT 10 04/21/2021   EOSPCT 7 04/21/2021   BASOPCT 1 04/21/2021    CMP:  Lab Results  Component Value Date   NA 138 04/23/2021   NA 139 03/28/2014   K 3.5 04/23/2021   K 3.8 03/28/2014   CL 108 04/23/2021   CL 108 (H) 03/28/2014   CO2 24 04/23/2021   CO2 27  03/28/2014   BUN 6 (L) 04/23/2021   BUN 7 03/28/2014   CREATININE 0.99 04/23/2021   CREATININE 0.86 03/28/2014   CREATININE 0.85 02/16/2013   PROT 6.5 04/16/2021   ALBUMIN 3.2 (L) 04/16/2021   BILITOT 0.8 04/16/2021   ALKPHOS 57 04/16/2021   AST 38 04/16/2021   ALT 106 (H) 04/16/2021  .   Total Discharge time is about 33 minutes  Roxan Hockey M.D on 04/26/2021 at 11:19 AM  Go to [email or web address] -  for contact info  [facility] - Office  [phone]

## 2021-04-30 ENCOUNTER — Emergency Department (HOSPITAL_COMMUNITY): Payer: Medicare HMO

## 2021-04-30 ENCOUNTER — Encounter (HOSPITAL_COMMUNITY): Payer: Self-pay

## 2021-04-30 ENCOUNTER — Other Ambulatory Visit: Payer: Self-pay

## 2021-04-30 ENCOUNTER — Emergency Department (HOSPITAL_COMMUNITY)
Admission: EM | Admit: 2021-04-30 | Discharge: 2021-05-01 | Disposition: A | Discharge status: Other Acute Inpatient Hospital | Payer: Medicare HMO | Attending: Emergency Medicine | Admitting: Emergency Medicine

## 2021-04-30 DIAGNOSIS — E119 Type 2 diabetes mellitus without complications: Secondary | ICD-10-CM | POA: Insufficient documentation

## 2021-04-30 DIAGNOSIS — Z79899 Other long term (current) drug therapy: Secondary | ICD-10-CM | POA: Insufficient documentation

## 2021-04-30 DIAGNOSIS — R443 Hallucinations, unspecified: Secondary | ICD-10-CM | POA: Diagnosis not present

## 2021-04-30 DIAGNOSIS — Z87891 Personal history of nicotine dependence: Secondary | ICD-10-CM | POA: Diagnosis not present

## 2021-04-30 DIAGNOSIS — R519 Headache, unspecified: Secondary | ICD-10-CM | POA: Insufficient documentation

## 2021-04-30 DIAGNOSIS — Z7901 Long term (current) use of anticoagulants: Secondary | ICD-10-CM | POA: Diagnosis not present

## 2021-04-30 DIAGNOSIS — R69 Illness, unspecified: Secondary | ICD-10-CM | POA: Diagnosis not present

## 2021-04-30 DIAGNOSIS — Z96653 Presence of artificial knee joint, bilateral: Secondary | ICD-10-CM | POA: Diagnosis not present

## 2021-04-30 DIAGNOSIS — R000 Tachycardia, unspecified: Secondary | ICD-10-CM | POA: Diagnosis not present

## 2021-04-30 DIAGNOSIS — R45851 Suicidal ideations: Secondary | ICD-10-CM | POA: Diagnosis not present

## 2021-04-30 DIAGNOSIS — I10 Essential (primary) hypertension: Secondary | ICD-10-CM | POA: Diagnosis not present

## 2021-04-30 DIAGNOSIS — N3000 Acute cystitis without hematuria: Secondary | ICD-10-CM

## 2021-04-30 DIAGNOSIS — R040 Epistaxis: Secondary | ICD-10-CM | POA: Diagnosis not present

## 2021-04-30 DIAGNOSIS — Z20822 Contact with and (suspected) exposure to covid-19: Secondary | ICD-10-CM | POA: Insufficient documentation

## 2021-04-30 DIAGNOSIS — F23 Brief psychotic disorder: Secondary | ICD-10-CM

## 2021-04-30 LAB — URINALYSIS, ROUTINE W REFLEX MICROSCOPIC
Bilirubin Urine: NEGATIVE
Glucose, UA: NEGATIVE mg/dL
Ketones, ur: 5 mg/dL — AB
Nitrite: NEGATIVE
Protein, ur: NEGATIVE mg/dL
Specific Gravity, Urine: 1.005 (ref 1.005–1.030)
pH: 6 (ref 5.0–8.0)

## 2021-04-30 LAB — CBC WITH DIFFERENTIAL/PLATELET
Abs Immature Granulocytes: 0.03 10*3/uL (ref 0.00–0.07)
Basophils Absolute: 0.1 10*3/uL (ref 0.0–0.1)
Basophils Relative: 1 %
Eosinophils Absolute: 0.2 10*3/uL (ref 0.0–0.5)
Eosinophils Relative: 2 %
HCT: 46.1 % — ABNORMAL HIGH (ref 36.0–46.0)
Hemoglobin: 13.3 g/dL (ref 12.0–15.0)
Immature Granulocytes: 0 %
Lymphocytes Relative: 29 %
Lymphs Abs: 2.4 10*3/uL (ref 0.7–4.0)
MCH: 27.3 pg (ref 26.0–34.0)
MCHC: 28.9 g/dL — ABNORMAL LOW (ref 30.0–36.0)
MCV: 94.5 fL (ref 80.0–100.0)
Monocytes Absolute: 0.7 10*3/uL (ref 0.1–1.0)
Monocytes Relative: 8 %
Neutro Abs: 5 10*3/uL (ref 1.7–7.7)
Neutrophils Relative %: 60 %
Platelets: 295 10*3/uL (ref 150–400)
RBC: 4.88 MIL/uL (ref 3.87–5.11)
RDW: 16.9 % — ABNORMAL HIGH (ref 11.5–15.5)
WBC: 8.4 10*3/uL (ref 4.0–10.5)
nRBC: 0 % (ref 0.0–0.2)

## 2021-04-30 LAB — BASIC METABOLIC PANEL
Anion gap: 12 (ref 5–15)
BUN: <5 mg/dL — ABNORMAL LOW (ref 8–23)
CO2: 24 mmol/L (ref 22–32)
Calcium: 9.2 mg/dL (ref 8.9–10.3)
Chloride: 107 mmol/L (ref 98–111)
Creatinine, Ser: 0.91 mg/dL (ref 0.44–1.00)
GFR, Estimated: >60 mL/min (ref >60)
Glucose, Bld: 120 mg/dL — ABNORMAL HIGH (ref 70–99)
Potassium: 3.2 mmol/L — ABNORMAL LOW (ref 3.5–5.1)
Sodium: 143 mmol/L (ref 135–145)

## 2021-04-30 LAB — ETHANOL: Alcohol, Ethyl (B): <10 mg/dL (ref <10)

## 2021-04-30 LAB — SALICYLATE LEVEL: Salicylate Lvl: <7 mg/dL — ABNORMAL LOW (ref 7.0–30.0)

## 2021-04-30 LAB — ACETAMINOPHEN LEVEL: Acetaminophen (Tylenol), Serum: <10 ug/mL — ABNORMAL LOW (ref 10–30)

## 2021-04-30 LAB — PROTIME-INR
INR: 2 — ABNORMAL HIGH (ref 0.8–1.2)
Prothrombin Time: 22.7 seconds — ABNORMAL HIGH (ref 11.4–15.2)

## 2021-04-30 MED ORDER — ONDANSETRON HCL 4 MG PO TABS: 4.0000 mg | ORAL_TABLET | Freq: Three times a day (TID) | ORAL | Status: DC | PRN

## 2021-04-30 MED ORDER — LOSARTAN POTASSIUM 25 MG PO TABS
25.0000 mg | ORAL_TABLET | Freq: Every day | ORAL | Status: DC
  Administered 2021-05-01: 25 mg via ORAL
  Filled 2021-04-30: qty 1

## 2021-04-30 MED ORDER — FLUTICASONE PROPIONATE 50 MCG/ACT NA SUSP
1.0000 | Freq: Every day | NASAL | Status: DC
  Administered 2021-05-01: 1 via NASAL
  Filled 2021-04-30: qty 16

## 2021-04-30 MED ORDER — FERROUS SULFATE 325 (65 FE) MG PO TABS
325.0000 mg | ORAL_TABLET | Freq: Every day | ORAL | Status: DC
  Administered 2021-05-01: 325 mg via ORAL
  Filled 2021-04-30: qty 1

## 2021-04-30 MED ORDER — DILTIAZEM HCL ER COATED BEADS 180 MG PO CP24
360.0000 mg | ORAL_CAPSULE | Freq: Every day | ORAL | Status: DC
  Administered 2021-05-01: 360 mg via ORAL
  Filled 2021-04-30: qty 2

## 2021-04-30 MED ORDER — RISPERIDONE 1 MG PO TABS
0.5000 mg | ORAL_TABLET | Freq: Two times a day (BID) | ORAL | Status: DC
  Administered 2021-04-30 – 2021-05-01 (×2): 0.5 mg via ORAL
  Filled 2021-04-30 (×2): qty 1

## 2021-04-30 MED ORDER — APIXABAN 5 MG PO TABS
5.0000 mg | ORAL_TABLET | Freq: Two times a day (BID) | ORAL | Status: DC
  Administered 2021-04-30 – 2021-05-01 (×2): 5 mg via ORAL
  Filled 2021-04-30 (×2): qty 1

## 2021-04-30 NOTE — ED Notes (Signed)
EDP made aware of pt complaints of something crawling on her and on her bed, stating she wants to leave and run out in front of car.

## 2021-04-30 NOTE — ED Notes (Signed)
Per  Shanon Brow, NP pt meets inpatient criteria.

## 2021-04-30 NOTE — ED Notes (Signed)
Pt yelling and stating something is crawling all over her, there is something crawling all over her bed. Per sister, pt told her she was going to leave and run out in front of a car. Pt crying out and states "I can't take it, I can't take it. It's stuff on me."

## 2021-04-30 NOTE — ED Notes (Signed)
Sister at bedside, update given.

## 2021-04-30 NOTE — ED Notes (Signed)
Pt having tts at this time

## 2021-04-30 NOTE — ED Provider Notes (Signed)
[facility] EMERGENCY DEPARTMENT Provider Note   CSN: [id number] Arrival date & time: 04/30/21  1448     History Chief Complaint  Patient presents with  . Epistaxis  . Headache    Megan Oneal is a 65 y.o. female.  Patient presents to ED with complaint of headache, onset 5 days ago. Patient recently treated for saddle embolus, bilateral DVT with extensive occlusive thrombi. Currently on eliquis. Patient endorses blowing nose in morning and expressing small clots. No active bleeding from nose. Patient also having recurring thoughts that she can not "get out of her head". She is taking risperdal, but does not seem to help.  The history is provided by the patient.  Epistaxis Location:  Bilateral Duration:  3 days Timing:  Sporadic Chronicity:  New Context: anticoagulants and hypertension   Associated symptoms: headaches   Headache Pain location:  Generalized Quality:  Unable to specify Onset quality:  Gradual Duration:  5 days Timing:  Intermittent Progression:  Worsening Chronicity:  New Associated symptoms: no abdominal pain        Past Medical History:  Diagnosis Date  . Anxiety 03/12/2013  . Degenerative disc disease, lumbar    Epidural injections in 2007  . Diabetes mellitus without complication ([place])   . Endometriosis   . Frequent PVCs    12,000/24 hours  . Hypertension   . Otalgia, unspecified   . Palpitations 10/04/2010  . Vascular bruit 01/2013   Right neck    Patient Active Problem List   Diagnosis Date Noted  . Leg DVT (deep venous thromboembolism), acute, bilateral -extensive occlusive thrombus Bil- 03/2021 04/26/2021  . Saddle pulmonary embolus with Rt Heart Strain -Bil PEs/extensive occlusive thrombus of Both LE as well 04/26/2021  . Diabetes ([place]) 04/21/2021  . Pulmonary embolus ([place]) 04/15/2021  . AKI (acute kidney injury) ([place]) 04/15/2021  . Transaminitis 04/15/2021  . Psychosis ([place]) 04/02/2021  . Acute encephalopathy 03/26/2021  .  Auditory hallucinations 03/24/2021  . Otosclerosis 03/24/2021  . Encounter for screening colonoscopy 02/15/2019  . Obesity 03/12/2013  . Anxiety 03/12/2013  . Laboratory test 03/12/2013  . Frequent PVCs   . Degenerative disc disease, lumbar   . Hypertension   . Endometriosis   . Vascular bruit 01/21/2013  . Palpitations 10/04/2010    Past Surgical History:  Procedure Laterality Date  . BREAST SURGERY     benign tumor excised-age 82  . CARDIOVASCULAR STRESS TEST  03/13/2008   No scintigraphic evidence of inducible myocardial ischemia, EKG negative for ischemia, no ECG changes.  . COLONOSCOPY  2009  . COLONOSCOPY N/A 09/21/2019   Procedure: COLONOSCOPY;  Surgeon: Rogene Houston, MD;  Location: [facility] ENDO SUITE;  Service: Endoscopy;  Laterality: N/A;  2:00-10:30  . CYST EXCISION  2007   Scalp  . DOPPLER ECHOCARDIOGRAPHY  03/13/2008   EF >18%, LV systolic function is normal, LV wall function is normal  . LAPAROSCOPIC CHOLECYSTECTOMY  2010  . LUMBAR LAMINECTOMY  2009    L4-L5 and L5-S1 laminectomy with diskectomy; transforaminal  . POLYPECTOMY  09/21/2019   Procedure: POLYPECTOMY;  Surgeon: Rogene Houston, MD;  Location: [facility] ENDO SUITE;  Service: Endoscopy;;  . REPLACEMENT TOTAL KNEE    . SLEEP STUDY  10/29/2009   AHI-1.8/hr, during REM 6.4/hr; RDI-8.0/hr, during REM 15.0/hr; avg oxygen during REM and NREM was 94%  . TOTAL KNEE ARTHROPLASTY Bilateral   . TUBAL LIGATION       OB History    Gravida  3   Para  3   Term      Preterm      AB      Living  3     SAB      IAB      Ectopic      Multiple      Live Births              Family History  Problem Relation Age of Onset  . Diabetes Mother   . Hypertension Mother   . Diabetes Brother   . Hypertension Brother   . Hypertension Father     Social History   Tobacco Use  . Smoking status: Former Smoker    Packs/day: 2.00    Years: 20.00    Pack years: 40.00    Types: Cigarettes    Start date:  08/03/1972    Quit date: 08/03/1996    Years since quitting: 24.7  . Smokeless tobacco: Former Systems developer    Quit date: 12/21/1994  Substance Use Topics  . Alcohol use: No  . Drug use: No    Home Medications Prior to Admission medications   Medication Sig Start Date End Date Taking? Authorizing Provider  acetaminophen (TYLENOL) 325 MG tablet Take 2 tablets (650 mg total) by mouth every 6 (six) hours as needed for mild pain (or Fever >/= 101). 04/18/21   Johnson, Clanford L, MD  apixaban (ELIQUIS) 5 MG TABS tablet Take 1 tablet (5 mg total) by mouth 2 (two) times daily. Start this around 24 May 2021 after you finish the initial starter pack 05/24/21 11/20/21  Roxan Hockey, MD  Apixaban Starter Pack, 10mg  and 5mg , (ELIQUIS DVT/PE STARTER PACK) Take as directed on package: start with two-5mg  tablets twice daily for 7 days. On day 8, switch to one-5mg  tablet twice daily. -Start this on Saturday, Apr 26, 2021 04/26/21   Roxan Hockey, MD  cholecalciferol (VITAMIN D3) 25 MCG (1000 UT) tablet Take 1,000 Units by mouth daily.    [provider]  dexlansoprazole (DEXILANT) 60 MG capsule Take 1 capsule (60 mg total) by mouth daily. 04/26/21   Roxan Hockey, MD  diltiazem (CARDIZEM CD) 360 MG 24 hr capsule Take 1 capsule (360 mg total) by mouth daily. 04/26/21 04/26/22  Roxan Hockey, MD  escitalopram (LEXAPRO) 10 MG tablet Take 1 tablet by mouth every morning. 04/21/21   [provider]  ferrous sulfate 325 (65 FE) MG tablet Take 325 mg by mouth daily with breakfast.     [provider]  fluticasone (FLONASE) 50 MCG/ACT nasal spray Place 2 sprays into both nostrils daily as needed for allergies. 07/24/19   [provider]  furosemide (LASIX) 40 MG tablet Take 40 mg by mouth every other day.     [provider]  ipratropium (ATROVENT) 0.03 % nasal spray Place 2 sprays into both nostrils 3 (three) times daily as needed for rhinitis.    [provider]   levocetirizine (XYZAL) 5 MG tablet Take 5 mg by mouth daily. 04/21/21   [provider]  losartan (COZAAR) 25 MG tablet Take 1 tablet (25 mg total) by mouth daily. For BP 04/27/21   Roxan Hockey, MD  polyethylene glycol powder (GLYCOLAX/MIRALAX) powder 1 scoop daily or as needed 06/07/18   Florian Buff, MD  potassium chloride (KLOR-CON) 10 MEQ tablet Take 1 tablet (10 mEq total) by mouth daily. Take While taking Lasix/furosemide 04/26/21   Roxan Hockey, MD  risperiDONE (RISPERDAL) 0.5 MG tablet Take 0.5 mg by mouth 2 (  two) times daily. 04/22/21   [provider]  risperiDONE (RISPERDAL) 2 MG tablet Take 1 tablet (2 mg total) by mouth at bedtime. Patient not taking: Reported on 04/30/2021 04/07/21   Nita Sells, MD  vitamin C (ASCORBIC ACID) 500 MG tablet Take 500 mg by mouth daily.    [provider]    Allergies    Metoprolol and Sulfa antibiotics  Review of Systems   Review of Systems  HENT: Positive for nosebleeds.   Respiratory: Negative for shortness of breath.   Cardiovascular: Negative for chest pain.  Gastrointestinal: Negative for abdominal pain.  Neurological: Positive for headaches.  Psychiatric/Behavioral: Positive for confusion.  All other systems reviewed and are negative.   Physical Exam Updated Vital Signs BP (!) 167/72   Pulse (!) 105   Temp 99.2 F (37.3 C) (Oral)   Resp (!) 24   Ht 5\' 6"  (1.676 m)   Wt 136.1 kg   SpO2 95%   BMI 48.42 kg/m   Physical Exam Vitals and nursing note reviewed.  Constitutional:      Appearance: She is not ill-appearing.  HENT:     Head: Atraumatic.     Mouth/Throat:     Mouth: Mucous membranes are moist.  Eyes:     Extraocular Movements: Extraocular movements intact.  Cardiovascular:     Rate and Rhythm: Tachycardia present.  Pulmonary:     Effort: Pulmonary effort is normal.  Abdominal:     Palpations: Abdomen is soft.  Musculoskeletal:        General: No tenderness.   Neurological:     Mental Status: She is alert.     GCS: GCS eye subscore is 4. GCS verbal subscore is 5. GCS motor subscore is 6.     Cranial Nerves: No facial asymmetry.     Sensory: No sensory deficit.  Psychiatric:        Attention and Perception: She is attentive.        Speech: Speech is delayed.        Behavior: Behavior is cooperative.     ED Results / Procedures / Treatments   Labs (all labs ordered are listed, but only abnormal results are displayed) Labs Reviewed - No data to display  EKG None  Radiology No results found.  Procedures Procedures   Medications Ordered in ED Medications - No data to display  ED Course  I have reviewed the triage vital signs and the nursing notes.  Pertinent labs & imaging results that were available during my care of the patient were reviewed by me and considered in my medical decision making (see chart for details).    2:30 PM Patient was found by nursing screaming and stating that bugs are crawling all over her body. She expressed suicidal ideation, told the nurse she was going to leave and run out into traffic. Lab and radiology results reviewed and are reassuring. Order placed for TTS consult. Patient is medically cleared at this time.  Patient has been evaluated by TTS, with recommendation for inpatient placement. Psych holding and home medication orders completed. MDM Rules/Calculators/A&P                           Final Clinical Impression(s) / ED Diagnoses Final diagnoses:  None    Rx / DC Orders ED Discharge Orders    None       Etta Quill, NP 04/30/21 2123    Ripley Fraise, MD 05/01/21  0027  

## 2021-04-30 NOTE — ED Notes (Signed)
Bonnye Fava [phone] Sister please call with update or collateral information.

## 2021-04-30 NOTE — ED Notes (Signed)
Security called to wand patient 

## 2021-04-30 NOTE — BH Assessment (Signed)
Comprehensive Clinical Assessment (CCA) Note  04/30/2021 Megan Oneal [id number]  Disposition:  Gave clinical report to S. Rankin, NP who determined that Pt meets inpatient criteria.    The patient demonstrates the following risk factors for suicide: Chronic risk factors for suicide include: medical illness Pt stated that she has history of blood clots. Acute risk factors for suicide include: unemployment and social withdrawal/isolation. Protective factors for this patient include: positive social support. Considering these factors, the overall suicide risk at this point appears to be low. Patient is not appropriate for outpatient follow up.  Chief Complaint:  Chief Complaint  Patient presents with  . Epistaxis  . Headache  . Delusional    Pt endorsed somatic, auditory, and visual hallucination as well as delusion.   Visit Diagnosis: Brief Psychotic Disorder  NARRATIVE:  Pt is a 65 year old female who presented to [facility] on a voluntary basis (accompanied by sister) with somatic compliant -- concern that she has blood clots in her head.  While at the ED, she endorsed delusion and hallucination.  Pt lives alone in [place], and she is retired.  Pt does not have an outpatient psychiatrist or therapist.  Hsitory taken from Pt and Pt's sister, Ms. McGhee.  Pt reported that she is experiencing auditory, tactile, and visual hallucinations -- she hears voices saying bad words and telling her to get her sister out of the room.  She also reported seeing and feeling snakes crawling on her.  Pt had no insight into the nature of these hallucinations, and per report, she told hospital staff that she wanted to leave the hospital and jump in front of traffic.  Pt endorsed ongoing anxiety - ''I'm nervous, anxious, worried all the time.''  Pt also endorsed sadness.  Per sister's report, Pt has struggled with hallucination and delusion over the last several years, but she does not have a formal diagnosis.  Pt stated  that she either overdosed or wanted to overdose in April to escape the voices she hears.   Pt denied current suicidal ideation, homicidal ideation, self-injurious behavior, and substance use concerns.  Pt said she is concerned she has blood clots in her brain due to severe headaches.  Per sister, this is plausible, since Pt has a history of blood clots in her legs.  Pt requested a ''brain scan'' to check.  During assessment, Pt presented as alert and oriented.  She had good eye contact (staring).  Demeanor was cooperative.  Pt was appropriately groomed.  Pt's mood and affect were anxious.  Pt's speech was slow, but otherwise normal in rhythm and volume.  Thought processes were slow.  Thought content suggested ongoing auditory, visual, and tactile hallucination.  Per sister's report, Pt has been responding to internal stimuli.  Pt believes the hallucination, suggesting delusion.  Insight, judgment, and impulse control were poor.  CCA Screening, Triage and Referral (STR)  Patient Reported Information How did you hear about [place]? Family/Friend  Referral name: Lenox Ponds, sister  Referral phone number: [phone]   GYIR do you see for routine medical problems? Primary Care  Practice/Facility Name: [facility]  Practice/Facility Phone Number: [phone]  Name of Contact: Dr. Adrian Prince  Contact Number: [phone]  Contact Fax Number: No data recorded Prescriber Name: No data recorded Prescriber Address (if known): No data recorded  What Is the Reason for Your Visit/Call Today? Pt worried she has brain clots; endorsed auditory, visual, and tactile hallucinations  How Long Has This Been Causing You Problems?  1-6 months  What Do You Feel Would Help You the Most Today? Medication(s)   Have You Recently Been in Any Inpatient Treatment (Hospital/Detox/Crisis Center/28-Day Program)? No  Name/Location of Program/Hospital:No data recorded How Long Were You There? No data  recorded When Were You Discharged? No data recorded  Have You Ever Received Services From [facility] Before? Yes  Who Do You [facility] at [facility]? [facility]   Have You Recently Had Any Thoughts About Hurting Yourself? Yes (In April, Pt endorsed overdose)  Are You Planning to Commit Suicide/Harm Yourself At This time? No   Have you Recently Had Thoughts About [place]? No  Explanation: No data recorded  Have You Used Any Alcohol or Drugs in the Past 24 Hours? No  How Long Ago Did You Use Drugs or Alcohol? No data recorded What Did You Use and How Much? No data recorded  Do You Currently Have a Therapist/Psychiatrist? No  Name of Therapist/Psychiatrist: No data recorded  Have You Been Recently Discharged From Any Office Practice or Programs? No  Explanation of Discharge From Practice/Program: No data recorded    CCA Screening Triage Referral Assessment Type of Contact: Tele-Assessment  Is this Initial or Reassessment? Initial Assessment  Date Telepsych consult ordered in [facility]:  04/30/2021  Time Telepsych consult ordered in [facility]:  No data recorded  Patient Reported Information Reviewed? Yes  Patient Left Without Being Seen? No data recorded Reason for Not Completing Assessment: No data recorded  Collateral Involvement: Sister   Does Patient Have a Stage manager Guardian? No data recorded Name and Contact of Legal Guardian: No data recorded If Minor and Not Living with Parent(s), Who has Custody? No data recorded Is CPS involved or ever been involved? Never  Is APS involved or ever been involved? Never   Patient Determined To Be At Risk for Harm To Self or Others Based on Review of Patient Reported Information or Presenting Complaint? No  Method: No data recorded Availability of Means: No data recorded Intent: No data recorded Notification Required: No data recorded Additional Information for Danger to Others Potential: No data  recorded Additional Comments for Danger to Others Potential: No data recorded Are There Guns or Other Weapons in Your Home? No data recorded Types of Guns/Weapons: No data recorded Are These Weapons Safely Secured?                            No data recorded Who Could Verify You Are Able To Have These Secured: No data recorded Do You Have any Outstanding Charges, Pending Court Dates, Parole/Probation? No data recorded Contacted To Inform of Risk of Harm To Self or Others: No data recorded  [facility] of Assessment: [facility]   Does Patient Present under Involuntary Commitment? No  IVC Papers Initial File Date: No data recorded  [place] of Residence: [place]   Patient Currently Receiving the Following Services: Not Receiving Services   Determination of Need: Urgent (48 hours)   Options For Referral: Medication Management; Outpatient Therapy; Inpatient Hospitalization     CCA Biopsychosocial Intake/Chief Complaint:  Pt worried she has brain clots; hallucinating and responding to internal stimuli  Current Symptoms/Problems: Hearing voices, believes snakes are on her bed; threatened to run out in front of traffic to escape   Patient Reported Schizophrenia/Schizoaffective Diagnosis in Past: No   Strengths: Supportive sister  Preferences: No data recorded Abilities: No data recorded  Type of Services Patient Feels  are Needed: Pt wants a brain scan to check for clots   Initial Clinical Notes/Concerns: Pt endorsed sadness, anxiety, auditory/visual/tactile hallucination; delusion   Mental Health Symptoms Depression:  Sleep (too much or little)   Duration of Depressive symptoms: Greater than two weeks   Mania:  N/A   Anxiety:   Difficulty concentrating; Fatigue; Sleep; Worrying; Restlessness   Psychosis:  Delusions; Hallucinations   Duration of Psychotic symptoms: Less than six months   Trauma:  N/A   Obsessions:  N/A   Compulsions:  N/A   Inattention:  N/A    Hyperactivity/Impulsivity:  N/A   Oppositional/Defiant Behaviors:  N/A   Emotional Irregularity:  N/A   Other Mood/Personality Symptoms:  No data recorded   Mental Status Exam Appearance and self-care  Stature:  Average   Weight:  Overweight   Clothing:  Casual   Grooming:  Normal   Cosmetic use:  Age appropriate   Posture/gait:  Normal   Motor activity:  Not Remarkable   Sensorium  Attention:  Normal   Concentration:  Normal   Orientation:  X5   Recall/memory:  Normal   Affect and Mood  Affect:  Anxious   Mood:  Anxious   Relating  Eye contact:  Normal   Facial expression:  Anxious   Attitude toward examiner:  Cooperative   Thought and Language  Speech flow: Clear and Coherent   Thought content:  Delusions   Preoccupation:  Somatic   Hallucinations:  Auditory; Tactile; Visual   Organization:  No data recorded  [employer] of Knowledge:  Average   Intelligence:  Average   Abstraction:  Functional   Judgement:  Poor   Reality Testing:  Distorted   Insight:  Poor   Decision Making:  Impulsive   Social Functioning  Social Maturity:  Isolates   Social Judgement:  Heedless   Stress  Stressors:  Other (Comment)   Coping Ability:  Normal   Skill Deficits:  None   Supports:  Family     Religion: Religion/Spirituality Are You A Religious Person?: Yes What is Your Religious Affiliation?: Personal assistant: Leisure / Recreation Do You Have Hobbies?: No  Exercise/Diet: Exercise/Diet Do You Exercise?: No Have You Gained or Lost A Significant Amount of Weight in the Past Six Months?: No Do You Follow a Special Diet?: Yes Type of Diet: Pt indicated that she is diabetic Do You Have Any Trouble Sleeping?: Yes Explanation of Sleeping Difficulties: Pt reported she is frequently awakened by tactile hallucination   CCA Employment/Education Employment/Work Situation: Employment / Work Situation Employment  situation: Retired  Scientist, physiological: Education Is Patient Currently Attending School?: No   CCA Family/Childhood History Family and Relationship History: Family history Marital status: Divorced  Childhood History:     Child/Adolescent Assessment:     CCA Substance Use Alcohol/Drug Use: Alcohol / Drug Use Pain Medications: Please see MAR Prescriptions: Please see MAR Over the Counter: Please see MAR History of alcohol / drug use?: No history of alcohol / drug abuse                         ASAM's:  Six Dimensions of Multidimensional Assessment  Dimension 1:  Acute Intoxication and/or Withdrawal Potential:      Dimension 2:  Biomedical Conditions and Complications:      Dimension 3:  Emotional, Behavioral, or Cognitive Conditions and Complications:     Dimension 4:  Readiness to Change:     Dimension  5:  Relapse, Continued use, or Continued Problem Potential:     Dimension 6:  Recovery/Living Environment:     ASAM Severity Score:    ASAM Recommended Level of Treatment:     Substance use Disorder (SUD)    Recommendations for Services/Supports/Treatments:    DSM5 Diagnoses: Patient Active Problem List   Diagnosis Date Noted  . Leg DVT (deep venous thromboembolism), acute, bilateral -extensive occlusive thrombus Bil- 03/2021 04/26/2021  . Saddle pulmonary embolus with Rt Heart Strain -Bil PEs/extensive occlusive thrombus of Both LE as well 04/26/2021  . Diabetes ([place]) 04/21/2021  . Pulmonary embolus ([place]) 04/15/2021  . AKI (acute kidney injury) ([place]) 04/15/2021  . Transaminitis 04/15/2021  . Psychosis ([place]) 04/02/2021  . Acute encephalopathy 03/26/2021  . Auditory hallucinations 03/24/2021  . Otosclerosis 03/24/2021  . Encounter for screening colonoscopy 02/15/2019  . Obesity 03/12/2013  . Anxiety 03/12/2013  . Laboratory test 03/12/2013  . Frequent PVCs   . Degenerative disc disease, lumbar   . Hypertension   . Endometriosis   . Vascular bruit  01/21/2013  . Palpitations 10/04/2010    Patient Centered Plan: Patient is on the following Treatment Plan(s):     Referrals to Alternative Service(s): Referred to Alternative Service(s):   Place:   Date:   Time:    Referred to Alternative Service(s):   Place:   Date:   Time:    Referred to Alternative Service(s):   Place:   Date:   Time:    Referred to Alternative Service(s):   Place:   Date:   Time:     Marlowe Aschoff, [facility]

## 2021-04-30 NOTE — ED Triage Notes (Signed)
Pt to er, pt states that she was here last week and dx with blood clots, states that she was discharged and went home, states that Friday she started having a headache, states that it is a constant pain.  States that the pain is getting worse. States that she started having some clots in her nose Saturday.  Pt very slow to answer questions.

## 2021-04-30 NOTE — ED Notes (Signed)
pts belongings removed and placed in locker room. Items are in pt belonging bag with patient sticker on bag.

## 2021-04-30 NOTE — BH Assessment (Addendum)
Disposition:   Per Earleen Newport, NP, Pt meets criteria for inpatient treatment. Patient to be referred to [facility] for consideration of bed placement. [place] Wynonia Hazard, RN) notified of patient's bed needs.   Updates provided by [facility] Wynonia Hazard, RN), [facility] has no appropriate bed placement for patient. [facility] recommended referring patient to outside facilities for consideration of bed placement.   Disposition Counselor referred patient to the following facilities for consideration of bed placement.  [facility] Details  [facility] Details  [facility] Details  [facility] Details  [facility] Details  [facility] Details  [facility] Details  [facility] Details  [facility] Details  [facility] Details [facility] Details  [facility] Details  [facility] Details  [facility] Details  [facility] Details [facility] Details  [facility] Details  [facility] Details  [facility] Details  [facility] Details  [facility] Details  [facility] Details  [facility] Details  [facility] Details  [facility] Details  [facility] Details  [facility] Details  [facility] Details  [facility] Details  [facility] Details

## 2021-05-01 DIAGNOSIS — R040 Epistaxis: Secondary | ICD-10-CM | POA: Diagnosis not present

## 2021-05-01 DIAGNOSIS — N390 Urinary tract infection, site not specified: Secondary | ICD-10-CM | POA: Diagnosis not present

## 2021-05-01 DIAGNOSIS — I2510 Atherosclerotic heart disease of native coronary artery without angina pectoris: Secondary | ICD-10-CM | POA: Diagnosis not present

## 2021-05-01 DIAGNOSIS — E119 Type 2 diabetes mellitus without complications: Secondary | ICD-10-CM | POA: Diagnosis not present

## 2021-05-01 DIAGNOSIS — I10 Essential (primary) hypertension: Secondary | ICD-10-CM | POA: Diagnosis not present

## 2021-05-01 DIAGNOSIS — K219 Gastro-esophageal reflux disease without esophagitis: Secondary | ICD-10-CM | POA: Diagnosis not present

## 2021-05-01 DIAGNOSIS — R519 Headache, unspecified: Secondary | ICD-10-CM | POA: Diagnosis not present

## 2021-05-01 DIAGNOSIS — Z87891 Personal history of nicotine dependence: Secondary | ICD-10-CM | POA: Diagnosis not present

## 2021-05-01 DIAGNOSIS — E785 Hyperlipidemia, unspecified: Secondary | ICD-10-CM | POA: Diagnosis not present

## 2021-05-01 DIAGNOSIS — I119 Hypertensive heart disease without heart failure: Secondary | ICD-10-CM | POA: Diagnosis not present

## 2021-05-01 DIAGNOSIS — F23 Brief psychotic disorder: Secondary | ICD-10-CM | POA: Diagnosis not present

## 2021-05-01 DIAGNOSIS — R000 Tachycardia, unspecified: Secondary | ICD-10-CM | POA: Diagnosis not present

## 2021-05-01 DIAGNOSIS — R69 Illness, unspecified: Secondary | ICD-10-CM | POA: Diagnosis not present

## 2021-05-01 DIAGNOSIS — G928 Other toxic encephalopathy: Secondary | ICD-10-CM | POA: Diagnosis not present

## 2021-05-01 DIAGNOSIS — A86 Unspecified viral encephalitis: Secondary | ICD-10-CM | POA: Diagnosis not present

## 2021-05-01 DIAGNOSIS — Z20822 Contact with and (suspected) exposure to covid-19: Secondary | ICD-10-CM | POA: Diagnosis not present

## 2021-05-01 DIAGNOSIS — F333 Major depressive disorder, recurrent, severe with psychotic symptoms: Secondary | ICD-10-CM | POA: Diagnosis not present

## 2021-05-01 DIAGNOSIS — Z96653 Presence of artificial knee joint, bilateral: Secondary | ICD-10-CM | POA: Diagnosis not present

## 2021-05-01 DIAGNOSIS — R45851 Suicidal ideations: Secondary | ICD-10-CM | POA: Diagnosis not present

## 2021-05-01 LAB — RAPID URINE DRUG SCREEN, HOSP PERFORMED
Amphetamines: NOT DETECTED
Barbiturates: NOT DETECTED
Benzodiazepines: NOT DETECTED
Cocaine: NOT DETECTED
Opiates: NOT DETECTED
Tetrahydrocannabinol: NOT DETECTED

## 2021-05-01 LAB — RESP PANEL BY RT-PCR (FLU A&B, COVID) ARPGX2
Influenza A by PCR: NEGATIVE
Influenza B by PCR: NEGATIVE
SARS Coronavirus 2 by RT PCR: NEGATIVE

## 2021-05-01 MED ORDER — LORAZEPAM 1 MG PO TABS
1.0000 mg | ORAL_TABLET | ORAL | Status: DC | PRN
  Administered 2021-05-01: 1 mg via ORAL
  Filled 2021-05-01: qty 1

## 2021-05-01 MED ORDER — CEPHALEXIN 500 MG PO CAPS
500.0000 mg | ORAL_CAPSULE | Freq: Two times a day (BID) | ORAL | Status: DC
  Administered 2021-05-01 (×2): 500 mg via ORAL
  Filled 2021-05-01 (×2): qty 1

## 2021-05-01 MED ORDER — ACETAMINOPHEN 325 MG PO TABS
650.0000 mg | ORAL_TABLET | Freq: Once | ORAL | Status: AC
  Administered 2021-05-01: 650 mg via ORAL
  Filled 2021-05-01: qty 2

## 2021-05-01 NOTE — ED Notes (Signed)
Pt able to take PO meds without any issues .

## 2021-05-01 NOTE — ED Notes (Signed)
Per Counselor--- intake staff at [facility]), pending negative Covid results. Patient has been accepted to [facility] for admission 05/01/2021 after 10am. Patient will be admitted to the [facility]. However, must check in to the [place] first for a temperature check. The accepting provider is Dr. Unknown Jim.Nurse report [phone].

## 2021-05-01 NOTE — ED Notes (Addendum)
Covid fax attempted to [facility]. Nurse attempted to call report. First attempted nurse was transferred to voicemail, second attempt to give report no one answered phone.

## 2021-05-01 NOTE — ED Notes (Signed)
[facility] has been called to transfer Pt to [facility].  Driver will call upon their arrival to ER.

## 2021-05-01 NOTE — ED Provider Notes (Signed)
Also noted the patient has a UTI, urine culture ordered and she was placed on Gershon Mussel, MD 05/01/21 0030

## 2021-05-01 NOTE — ED Notes (Signed)
Pt having visual hallucinations of someone in her room

## 2021-05-01 NOTE — ED Notes (Signed)
Pt has remained awake and restless during the night

## 2021-05-01 NOTE — ED Notes (Signed)
Pt complaining of leg pain/cramps and would like some tylenol. . Notified edp of same

## 2021-05-01 NOTE — BH Assessment (Addendum)
Received a call from intake staff at [facility]), pending negative Covid results. Patient has been accepted to [facility] for admission 05/01/2021 after 10am. Patient will be admitted to the [facility]. However, must check in to the [facility] first for a temperature check. The accepting provider is Dr. Unknown Jim .Nurse report [phone].  Nursing asked to fax Covid results to [facility].  Fax # [phone].  Patient's nurse Lonn Georgia, RN), provided updates.

## 2021-05-02 DIAGNOSIS — R69 Illness, unspecified: Secondary | ICD-10-CM | POA: Diagnosis not present

## 2021-05-02 LAB — URINE CULTURE

## 2021-05-03 DIAGNOSIS — R69 Illness, unspecified: Secondary | ICD-10-CM | POA: Diagnosis not present

## 2021-05-04 DIAGNOSIS — R69 Illness, unspecified: Secondary | ICD-10-CM | POA: Diagnosis not present

## 2021-05-05 DIAGNOSIS — I2510 Atherosclerotic heart disease of native coronary artery without angina pectoris: Secondary | ICD-10-CM | POA: Diagnosis not present

## 2021-05-05 DIAGNOSIS — G928 Other toxic encephalopathy: Secondary | ICD-10-CM | POA: Diagnosis not present

## 2021-05-05 DIAGNOSIS — R69 Illness, unspecified: Secondary | ICD-10-CM | POA: Diagnosis not present

## 2021-05-05 DIAGNOSIS — A86 Unspecified viral encephalitis: Secondary | ICD-10-CM | POA: Diagnosis not present

## 2021-05-05 DIAGNOSIS — I119 Hypertensive heart disease without heart failure: Secondary | ICD-10-CM | POA: Diagnosis not present

## 2021-05-06 DIAGNOSIS — R69 Illness, unspecified: Secondary | ICD-10-CM | POA: Diagnosis not present

## 2021-05-07 DIAGNOSIS — R69 Illness, unspecified: Secondary | ICD-10-CM | POA: Diagnosis not present

## 2021-05-08 DIAGNOSIS — R69 Illness, unspecified: Secondary | ICD-10-CM | POA: Diagnosis not present

## 2021-05-09 DIAGNOSIS — R69 Illness, unspecified: Secondary | ICD-10-CM | POA: Diagnosis not present

## 2021-05-10 DIAGNOSIS — R69 Illness, unspecified: Secondary | ICD-10-CM | POA: Diagnosis not present

## 2021-05-19 DIAGNOSIS — R1013 Epigastric pain: Secondary | ICD-10-CM | POA: Diagnosis not present

## 2021-05-19 DIAGNOSIS — K219 Gastro-esophageal reflux disease without esophagitis: Secondary | ICD-10-CM | POA: Diagnosis not present

## 2021-05-22 ENCOUNTER — Other Ambulatory Visit: Payer: Self-pay | Admitting: Psychiatry

## 2021-05-29 DIAGNOSIS — K2961 Other gastritis with bleeding: Secondary | ICD-10-CM | POA: Diagnosis not present

## 2021-05-29 DIAGNOSIS — Z8719 Personal history of other diseases of the digestive system: Secondary | ICD-10-CM | POA: Diagnosis not present

## 2021-05-29 DIAGNOSIS — E876 Hypokalemia: Secondary | ICD-10-CM | POA: Diagnosis not present

## 2021-05-29 DIAGNOSIS — E872 Acidosis: Secondary | ICD-10-CM | POA: Diagnosis not present

## 2021-05-29 DIAGNOSIS — Z96653 Presence of artificial knee joint, bilateral: Secondary | ICD-10-CM | POA: Diagnosis not present

## 2021-05-29 DIAGNOSIS — R69 Illness, unspecified: Secondary | ICD-10-CM | POA: Diagnosis not present

## 2021-05-29 DIAGNOSIS — Z6841 Body Mass Index (BMI) 40.0 and over, adult: Secondary | ICD-10-CM | POA: Diagnosis not present

## 2021-05-29 DIAGNOSIS — I110 Hypertensive heart disease with heart failure: Secondary | ICD-10-CM | POA: Diagnosis not present

## 2021-05-29 DIAGNOSIS — Z87891 Personal history of nicotine dependence: Secondary | ICD-10-CM | POA: Diagnosis not present

## 2021-05-29 DIAGNOSIS — K922 Gastrointestinal hemorrhage, unspecified: Secondary | ICD-10-CM | POA: Diagnosis not present

## 2021-05-29 DIAGNOSIS — I509 Heart failure, unspecified: Secondary | ICD-10-CM | POA: Diagnosis not present

## 2021-05-29 DIAGNOSIS — Z20822 Contact with and (suspected) exposure to covid-19: Secondary | ICD-10-CM | POA: Diagnosis not present

## 2021-05-29 DIAGNOSIS — K2971 Gastritis, unspecified, with bleeding: Secondary | ICD-10-CM | POA: Diagnosis not present

## 2021-05-29 DIAGNOSIS — E860 Dehydration: Secondary | ICD-10-CM | POA: Diagnosis not present

## 2021-05-29 DIAGNOSIS — Z7901 Long term (current) use of anticoagulants: Secondary | ICD-10-CM | POA: Diagnosis not present

## 2021-05-29 DIAGNOSIS — K219 Gastro-esophageal reflux disease without esophagitis: Secondary | ICD-10-CM | POA: Diagnosis not present

## 2021-05-29 DIAGNOSIS — Z882 Allergy status to sulfonamides status: Secondary | ICD-10-CM | POA: Diagnosis not present

## 2021-05-29 DIAGNOSIS — E785 Hyperlipidemia, unspecified: Secondary | ICD-10-CM | POA: Diagnosis not present

## 2021-05-30 DIAGNOSIS — K921 Melena: Secondary | ICD-10-CM | POA: Diagnosis not present

## 2021-05-30 DIAGNOSIS — K219 Gastro-esophageal reflux disease without esophagitis: Secondary | ICD-10-CM | POA: Diagnosis not present

## 2021-05-30 DIAGNOSIS — I10 Essential (primary) hypertension: Secondary | ICD-10-CM | POA: Diagnosis not present

## 2021-05-30 DIAGNOSIS — E669 Obesity, unspecified: Secondary | ICD-10-CM | POA: Diagnosis not present

## 2021-05-30 DIAGNOSIS — K922 Gastrointestinal hemorrhage, unspecified: Secondary | ICD-10-CM | POA: Diagnosis not present

## 2021-05-30 DIAGNOSIS — K2971 Gastritis, unspecified, with bleeding: Secondary | ICD-10-CM | POA: Diagnosis not present

## 2021-05-30 DIAGNOSIS — Z7901 Long term (current) use of anticoagulants: Secondary | ICD-10-CM | POA: Diagnosis not present

## 2021-05-30 DIAGNOSIS — D649 Anemia, unspecified: Secondary | ICD-10-CM | POA: Diagnosis not present

## 2021-05-30 DIAGNOSIS — K2961 Other gastritis with bleeding: Secondary | ICD-10-CM | POA: Diagnosis not present

## 2021-05-30 DIAGNOSIS — E872 Acidosis: Secondary | ICD-10-CM | POA: Diagnosis not present

## 2021-05-31 DIAGNOSIS — E669 Obesity, unspecified: Secondary | ICD-10-CM | POA: Diagnosis not present

## 2021-05-31 DIAGNOSIS — K2971 Gastritis, unspecified, with bleeding: Secondary | ICD-10-CM | POA: Diagnosis not present

## 2021-05-31 DIAGNOSIS — Z6841 Body Mass Index (BMI) 40.0 and over, adult: Secondary | ICD-10-CM | POA: Diagnosis not present

## 2021-05-31 DIAGNOSIS — K2950 Unspecified chronic gastritis without bleeding: Secondary | ICD-10-CM | POA: Diagnosis not present

## 2021-05-31 DIAGNOSIS — R69 Illness, unspecified: Secondary | ICD-10-CM | POA: Diagnosis not present

## 2021-05-31 DIAGNOSIS — E872 Acidosis: Secondary | ICD-10-CM | POA: Diagnosis not present

## 2021-05-31 DIAGNOSIS — I509 Heart failure, unspecified: Secondary | ICD-10-CM | POA: Diagnosis not present

## 2021-05-31 DIAGNOSIS — Z20822 Contact with and (suspected) exposure to covid-19: Secondary | ICD-10-CM | POA: Diagnosis not present

## 2021-05-31 DIAGNOSIS — I110 Hypertensive heart disease with heart failure: Secondary | ICD-10-CM | POA: Diagnosis not present

## 2021-05-31 DIAGNOSIS — I10 Essential (primary) hypertension: Secondary | ICD-10-CM | POA: Diagnosis not present

## 2021-06-10 ENCOUNTER — Encounter: Payer: Medicare HMO | Attending: Family Medicine | Admitting: Nutrition

## 2021-06-10 ENCOUNTER — Other Ambulatory Visit: Payer: Self-pay

## 2021-06-11 DIAGNOSIS — E7849 Other hyperlipidemia: Secondary | ICD-10-CM | POA: Diagnosis not present

## 2021-06-11 DIAGNOSIS — M1991 Primary osteoarthritis, unspecified site: Secondary | ICD-10-CM | POA: Diagnosis not present

## 2021-06-11 DIAGNOSIS — R69 Illness, unspecified: Secondary | ICD-10-CM | POA: Diagnosis not present

## 2021-06-11 DIAGNOSIS — E119 Type 2 diabetes mellitus without complications: Secondary | ICD-10-CM | POA: Diagnosis not present

## 2021-07-02 ENCOUNTER — Ambulatory Visit: Payer: Medicare HMO | Admitting: Nutrition

## 2021-07-03 ENCOUNTER — Other Ambulatory Visit: Payer: Self-pay

## 2021-07-03 NOTE — Progress Notes (Signed)
Opened in error

## 2021-07-08 DIAGNOSIS — K2970 Gastritis, unspecified, without bleeding: Secondary | ICD-10-CM | POA: Diagnosis not present

## 2021-07-08 DIAGNOSIS — D126 Benign neoplasm of colon, unspecified: Secondary | ICD-10-CM | POA: Diagnosis not present

## 2021-07-08 DIAGNOSIS — K921 Melena: Secondary | ICD-10-CM | POA: Diagnosis not present

## 2021-07-20 DIAGNOSIS — E782 Mixed hyperlipidemia: Secondary | ICD-10-CM | POA: Diagnosis not present

## 2021-07-20 DIAGNOSIS — I10 Essential (primary) hypertension: Secondary | ICD-10-CM | POA: Diagnosis not present

## 2021-07-21 DIAGNOSIS — H6123 Impacted cerumen, bilateral: Secondary | ICD-10-CM | POA: Diagnosis not present

## 2021-07-21 DIAGNOSIS — H8093 Unspecified otosclerosis, bilateral: Secondary | ICD-10-CM | POA: Diagnosis not present

## 2021-07-21 DIAGNOSIS — H903 Sensorineural hearing loss, bilateral: Secondary | ICD-10-CM | POA: Diagnosis not present

## 2021-07-31 IMAGING — CT CT HEAD W/O CM
3 series · 16 of 47 positions shown, 19 images · non-contrast
Comparison: 03/26/2021

CLINICAL DATA: Headache

EXAM:
CT HEAD WITHOUT CONTRAST
TECHNIQUE: Contiguous axial images were obtained from the base of the skull
through the vertex without intravenous contrast.

[Series 2: head w o · axial · 0.44mm/px · z∈[-8,+132]mm · 10 of 34 slices shown, 13 images]
[im 3/34  brain]
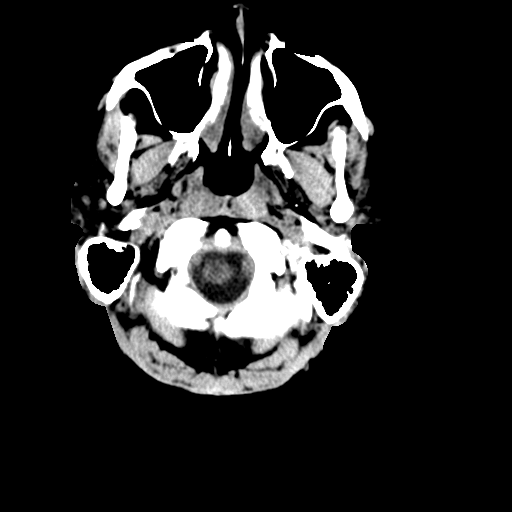
[im 3/34  bone]
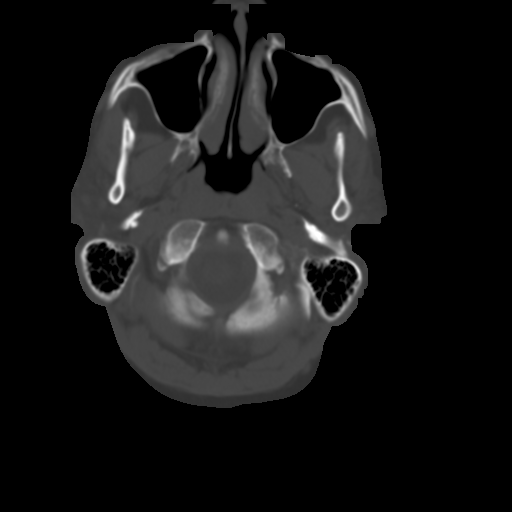
[im 6/34  brain]
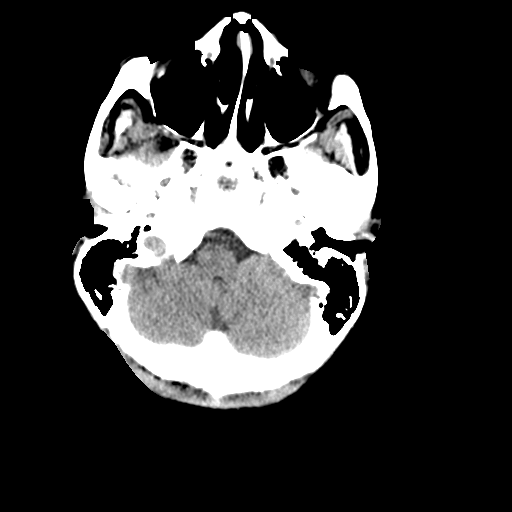
[im 10/34  brain]
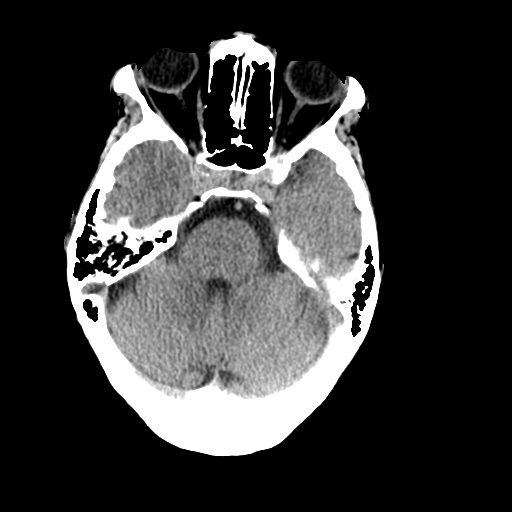
[im 12/34  brain]
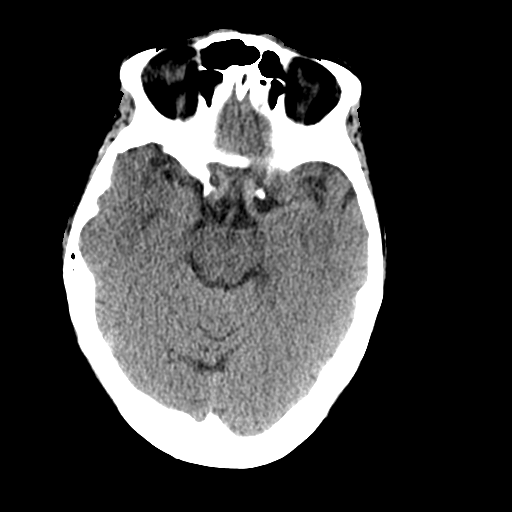
[im 15/34  brain]
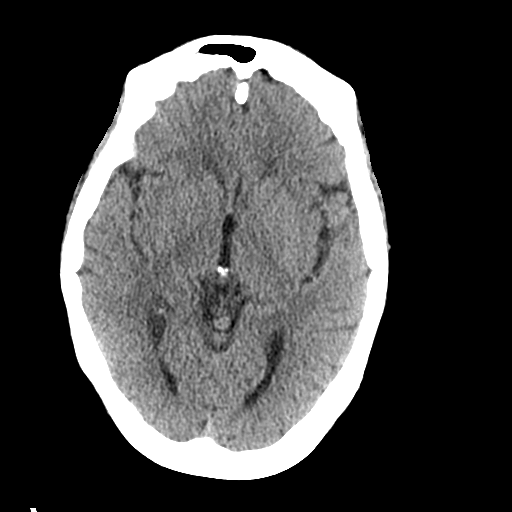
[im 15/34  bone]
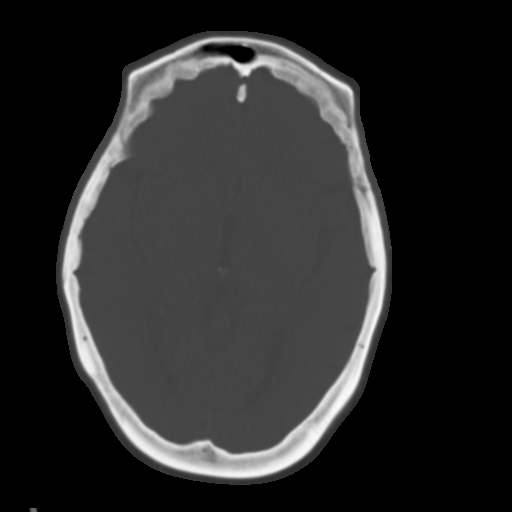
[im 19/34  brain]
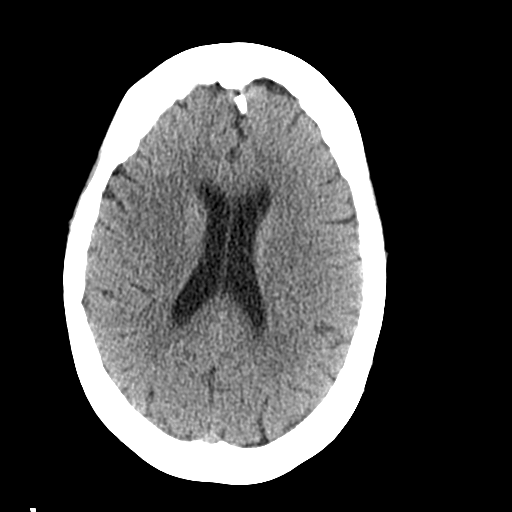
[im 22/34  brain]
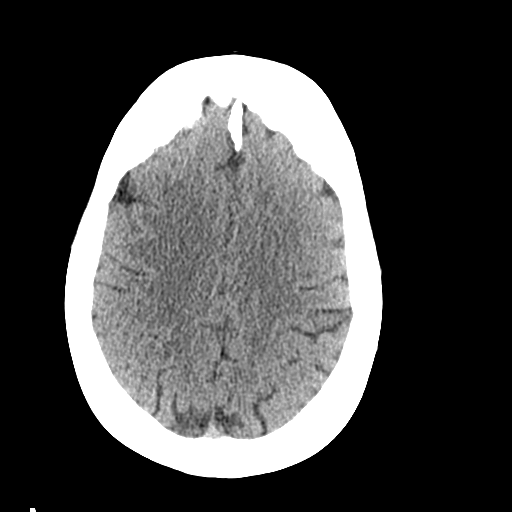
[im 26/34  brain]
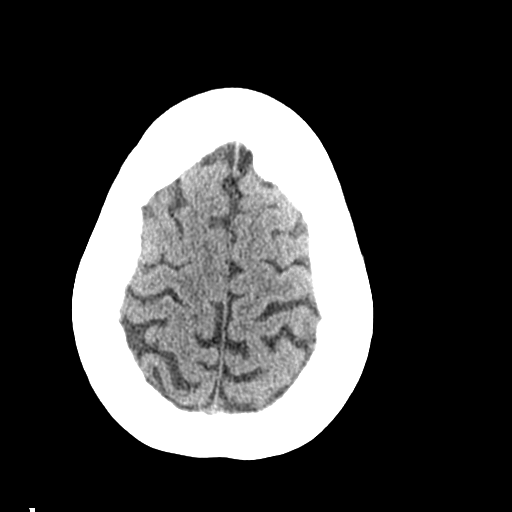
[im 28/34  brain]
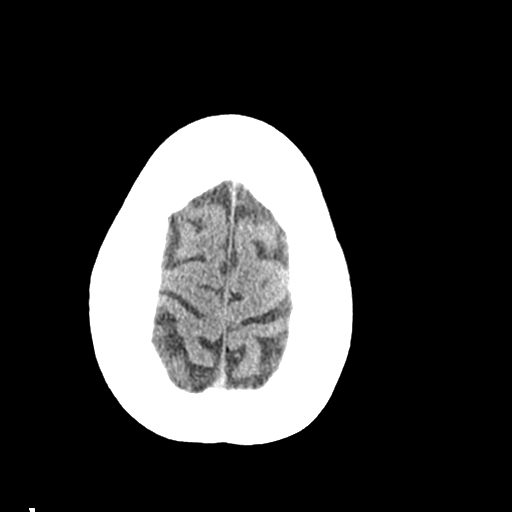
[im 28/34  bone]
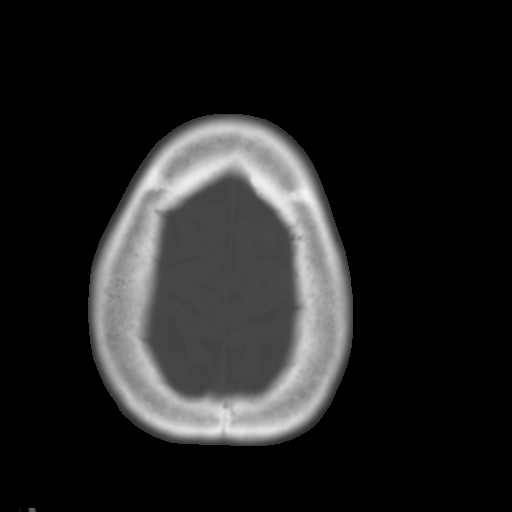
[im 31/34  brain]
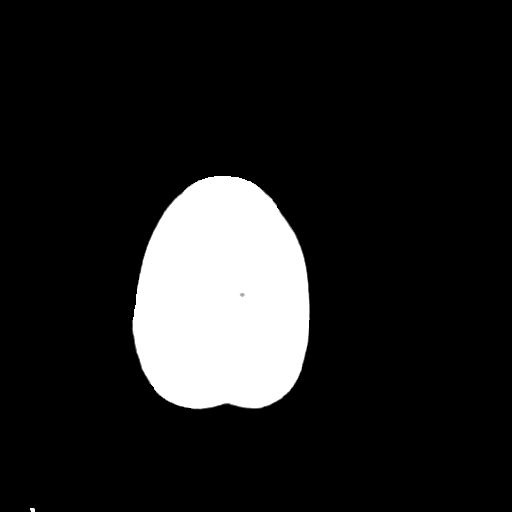

[Series 4: coronal soft · coronal · 0.34mm/px · 3 of 70 slices shown]
[im 24/70  brain]
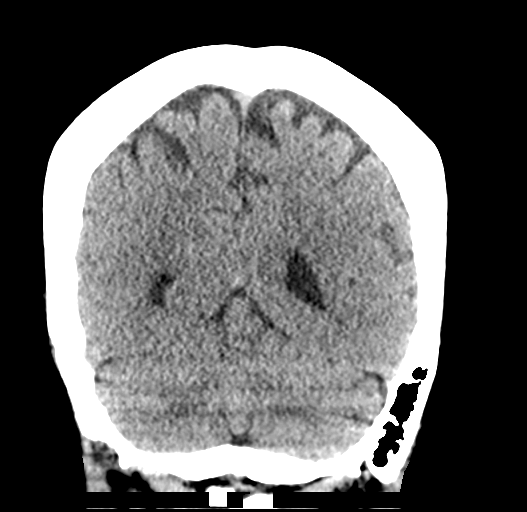
[im 31/70  brain]
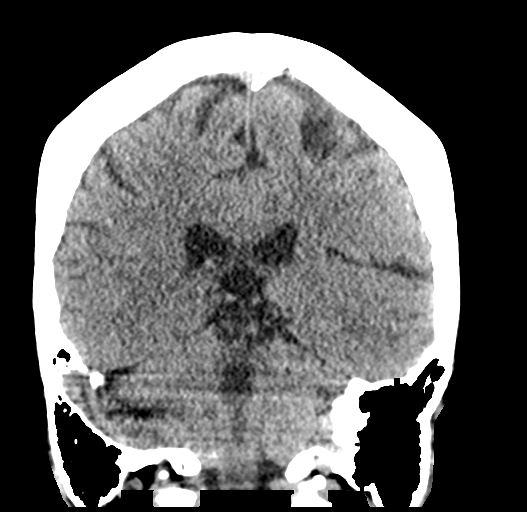
[im 39/70  brain]
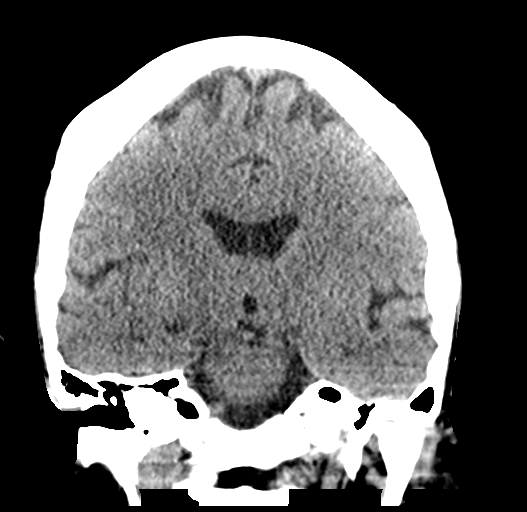

[Series 5: sagittal soft · sagittal · 0.33mm/px · 3 of 54 slices shown]
[im 18/54  brain]
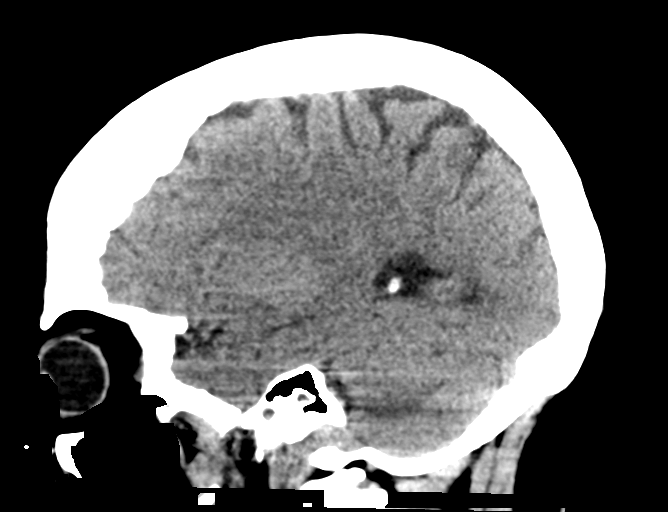
[im 27/54  brain]
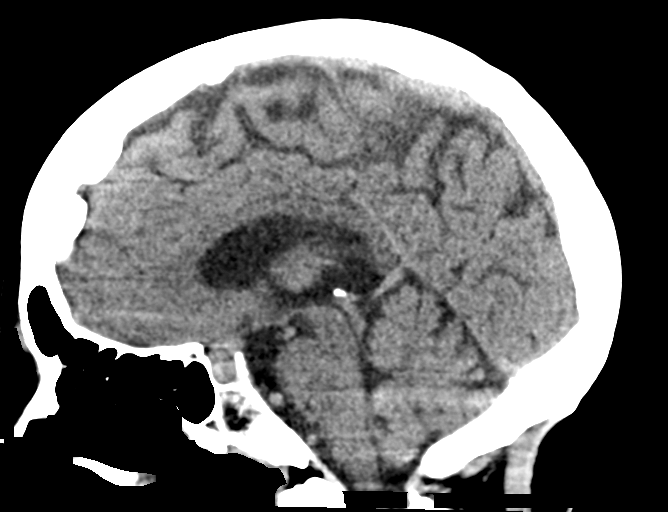
[im 36/54  brain]
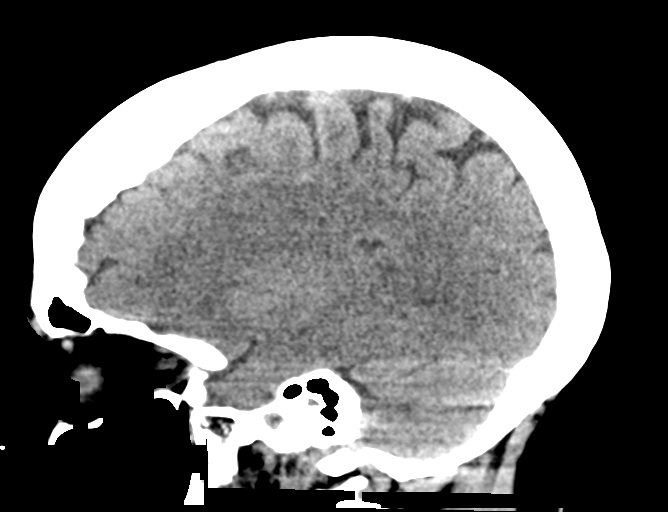

[16 of 47 positions shown; findings below may reference images not displayed]

FINDINGS: Brain: There is no acute intracranial hemorrhage, mass effect, or
edema. Gray-white differentiation is preserved. There is no
extra-axial fluid collection. Ventricles and sulci are within normal
limits in size and configuration.

Vascular: No hyperdense vessel or unexpected calcification.

Skull: Calvarium is unremarkable.

Sinuses/Orbits: No acute finding.

Other: None.
IMPRESSION: No acute intracranial abnormality.

## 2021-08-18 DIAGNOSIS — H8093 Unspecified otosclerosis, bilateral: Secondary | ICD-10-CM | POA: Diagnosis not present

## 2021-08-18 DIAGNOSIS — H906 Mixed conductive and sensorineural hearing loss, bilateral: Secondary | ICD-10-CM | POA: Diagnosis not present

## 2021-08-20 DIAGNOSIS — E7849 Other hyperlipidemia: Secondary | ICD-10-CM | POA: Diagnosis not present

## 2021-08-20 DIAGNOSIS — I10 Essential (primary) hypertension: Secondary | ICD-10-CM | POA: Diagnosis not present

## 2021-10-20 DIAGNOSIS — H814 Vertigo of central origin: Secondary | ICD-10-CM | POA: Diagnosis not present

## 2021-10-20 DIAGNOSIS — I10 Essential (primary) hypertension: Secondary | ICD-10-CM | POA: Diagnosis not present

## 2021-10-20 DIAGNOSIS — H8093 Unspecified otosclerosis, bilateral: Secondary | ICD-10-CM | POA: Diagnosis not present

## 2021-10-20 DIAGNOSIS — H906 Mixed conductive and sensorineural hearing loss, bilateral: Secondary | ICD-10-CM | POA: Diagnosis not present

## 2021-10-20 DIAGNOSIS — J301 Allergic rhinitis due to pollen: Secondary | ICD-10-CM | POA: Diagnosis not present

## 2021-10-20 DIAGNOSIS — E782 Mixed hyperlipidemia: Secondary | ICD-10-CM | POA: Diagnosis not present

## 2021-11-15 DIAGNOSIS — S46912A Strain of unspecified muscle, fascia and tendon at shoulder and upper arm level, left arm, initial encounter: Secondary | ICD-10-CM | POA: Diagnosis not present

## 2021-11-15 DIAGNOSIS — M5412 Radiculopathy, cervical region: Secondary | ICD-10-CM | POA: Diagnosis not present

## 2021-11-19 DIAGNOSIS — H6123 Impacted cerumen, bilateral: Secondary | ICD-10-CM | POA: Diagnosis not present

## 2021-11-19 DIAGNOSIS — H906 Mixed conductive and sensorineural hearing loss, bilateral: Secondary | ICD-10-CM | POA: Diagnosis not present

## 2021-11-19 DIAGNOSIS — H8093 Unspecified otosclerosis, bilateral: Secondary | ICD-10-CM | POA: Diagnosis not present

## 2021-11-19 DIAGNOSIS — H814 Vertigo of central origin: Secondary | ICD-10-CM | POA: Diagnosis not present

## 2021-11-19 DIAGNOSIS — J301 Allergic rhinitis due to pollen: Secondary | ICD-10-CM | POA: Diagnosis not present

## 2021-12-10 DIAGNOSIS — M5412 Radiculopathy, cervical region: Secondary | ICD-10-CM | POA: Diagnosis not present

## 2022-01-16 DIAGNOSIS — F251 Schizoaffective disorder, depressive type: Secondary | ICD-10-CM | POA: Diagnosis not present

## 2022-01-16 DIAGNOSIS — F419 Anxiety disorder, unspecified: Secondary | ICD-10-CM | POA: Diagnosis not present

## 2022-01-16 DIAGNOSIS — R69 Illness, unspecified: Secondary | ICD-10-CM | POA: Diagnosis not present

## 2022-01-21 DIAGNOSIS — H109 Unspecified conjunctivitis: Secondary | ICD-10-CM | POA: Diagnosis not present

## 2022-01-21 DIAGNOSIS — J069 Acute upper respiratory infection, unspecified: Secondary | ICD-10-CM | POA: Diagnosis not present

## 2022-01-27 DIAGNOSIS — R0789 Other chest pain: Secondary | ICD-10-CM | POA: Diagnosis not present

## 2022-01-27 DIAGNOSIS — Z86718 Personal history of other venous thrombosis and embolism: Secondary | ICD-10-CM | POA: Diagnosis not present

## 2022-01-27 DIAGNOSIS — I509 Heart failure, unspecified: Secondary | ICD-10-CM | POA: Diagnosis not present

## 2022-01-27 DIAGNOSIS — I110 Hypertensive heart disease with heart failure: Secondary | ICD-10-CM | POA: Diagnosis not present

## 2022-01-27 DIAGNOSIS — Z6841 Body Mass Index (BMI) 40.0 and over, adult: Secondary | ICD-10-CM | POA: Diagnosis not present

## 2022-01-27 DIAGNOSIS — R002 Palpitations: Secondary | ICD-10-CM | POA: Diagnosis not present

## 2022-01-27 DIAGNOSIS — E785 Hyperlipidemia, unspecified: Secondary | ICD-10-CM | POA: Diagnosis not present

## 2022-01-30 DIAGNOSIS — F251 Schizoaffective disorder, depressive type: Secondary | ICD-10-CM | POA: Diagnosis not present

## 2022-01-30 DIAGNOSIS — R69 Illness, unspecified: Secondary | ICD-10-CM | POA: Diagnosis not present

## 2022-01-30 DIAGNOSIS — F419 Anxiety disorder, unspecified: Secondary | ICD-10-CM | POA: Diagnosis not present

## 2022-02-03 DIAGNOSIS — R0789 Other chest pain: Secondary | ICD-10-CM | POA: Diagnosis not present

## 2022-02-03 DIAGNOSIS — Z86711 Personal history of pulmonary embolism: Secondary | ICD-10-CM | POA: Diagnosis not present

## 2022-02-03 DIAGNOSIS — Z86718 Personal history of other venous thrombosis and embolism: Secondary | ICD-10-CM | POA: Diagnosis not present

## 2022-02-03 DIAGNOSIS — Z7901 Long term (current) use of anticoagulants: Secondary | ICD-10-CM | POA: Diagnosis not present

## 2022-02-03 DIAGNOSIS — K219 Gastro-esophageal reflux disease without esophagitis: Secondary | ICD-10-CM | POA: Diagnosis not present

## 2022-02-03 DIAGNOSIS — Z6841 Body Mass Index (BMI) 40.0 and over, adult: Secondary | ICD-10-CM | POA: Diagnosis not present

## 2022-02-03 DIAGNOSIS — I10 Essential (primary) hypertension: Secondary | ICD-10-CM | POA: Diagnosis not present

## 2022-02-03 DIAGNOSIS — E785 Hyperlipidemia, unspecified: Secondary | ICD-10-CM | POA: Diagnosis not present

## 2022-02-03 DIAGNOSIS — R079 Chest pain, unspecified: Secondary | ICD-10-CM | POA: Diagnosis not present

## 2022-02-19 DIAGNOSIS — B354 Tinea corporis: Secondary | ICD-10-CM | POA: Diagnosis not present

## 2022-02-24 DIAGNOSIS — F419 Anxiety disorder, unspecified: Secondary | ICD-10-CM | POA: Diagnosis not present

## 2022-02-24 DIAGNOSIS — R69 Illness, unspecified: Secondary | ICD-10-CM | POA: Diagnosis not present

## 2022-02-24 DIAGNOSIS — F251 Schizoaffective disorder, depressive type: Secondary | ICD-10-CM | POA: Diagnosis not present

## 2022-02-26 DIAGNOSIS — K219 Gastro-esophageal reflux disease without esophagitis: Secondary | ICD-10-CM | POA: Diagnosis not present

## 2022-02-26 DIAGNOSIS — I10 Essential (primary) hypertension: Secondary | ICD-10-CM | POA: Diagnosis not present

## 2022-02-26 DIAGNOSIS — Z7901 Long term (current) use of anticoagulants: Secondary | ICD-10-CM | POA: Diagnosis not present

## 2022-02-26 DIAGNOSIS — Z86718 Personal history of other venous thrombosis and embolism: Secondary | ICD-10-CM | POA: Diagnosis not present

## 2022-02-26 DIAGNOSIS — R079 Chest pain, unspecified: Secondary | ICD-10-CM | POA: Diagnosis not present

## 2022-02-26 DIAGNOSIS — Z6841 Body Mass Index (BMI) 40.0 and over, adult: Secondary | ICD-10-CM | POA: Diagnosis not present

## 2022-02-26 DIAGNOSIS — E785 Hyperlipidemia, unspecified: Secondary | ICD-10-CM | POA: Diagnosis not present

## 2022-02-26 DIAGNOSIS — R0789 Other chest pain: Secondary | ICD-10-CM | POA: Diagnosis not present

## 2022-02-26 DIAGNOSIS — Z86711 Personal history of pulmonary embolism: Secondary | ICD-10-CM | POA: Diagnosis not present

## 2022-03-10 DIAGNOSIS — E559 Vitamin D deficiency, unspecified: Secondary | ICD-10-CM | POA: Diagnosis not present

## 2022-03-10 DIAGNOSIS — Z1329 Encounter for screening for other suspected endocrine disorder: Secondary | ICD-10-CM | POA: Diagnosis not present

## 2022-03-10 DIAGNOSIS — Z131 Encounter for screening for diabetes mellitus: Secondary | ICD-10-CM | POA: Diagnosis not present

## 2022-03-10 DIAGNOSIS — I10 Essential (primary) hypertension: Secondary | ICD-10-CM | POA: Diagnosis not present

## 2022-03-10 DIAGNOSIS — R5383 Other fatigue: Secondary | ICD-10-CM | POA: Diagnosis not present

## 2022-03-10 DIAGNOSIS — Z6836 Body mass index (BMI) 36.0-36.9, adult: Secondary | ICD-10-CM | POA: Diagnosis not present

## 2022-03-10 DIAGNOSIS — Z13220 Encounter for screening for lipoid disorders: Secondary | ICD-10-CM | POA: Diagnosis not present

## 2022-03-10 DIAGNOSIS — F332 Major depressive disorder, recurrent severe without psychotic features: Secondary | ICD-10-CM | POA: Diagnosis not present

## 2022-03-10 DIAGNOSIS — E538 Deficiency of other specified B group vitamins: Secondary | ICD-10-CM | POA: Diagnosis not present

## 2022-03-10 DIAGNOSIS — N951 Menopausal and female climacteric states: Secondary | ICD-10-CM | POA: Diagnosis not present

## 2022-03-10 DIAGNOSIS — Z0000 Encounter for general adult medical examination without abnormal findings: Secondary | ICD-10-CM | POA: Diagnosis not present

## 2022-03-10 DIAGNOSIS — Z1331 Encounter for screening for depression: Secondary | ICD-10-CM | POA: Diagnosis not present

## 2022-03-10 DIAGNOSIS — R4181 Age-related cognitive decline: Secondary | ICD-10-CM | POA: Diagnosis not present

## 2022-03-10 DIAGNOSIS — R69 Illness, unspecified: Secondary | ICD-10-CM | POA: Diagnosis not present

## 2022-03-10 DIAGNOSIS — Z1159 Encounter for screening for other viral diseases: Secondary | ICD-10-CM | POA: Diagnosis not present

## 2022-03-10 DIAGNOSIS — I7090 Unspecified atherosclerosis: Secondary | ICD-10-CM | POA: Diagnosis not present

## 2022-03-10 DIAGNOSIS — Z1389 Encounter for screening for other disorder: Secondary | ICD-10-CM | POA: Diagnosis not present

## 2022-03-10 DIAGNOSIS — E7849 Other hyperlipidemia: Secondary | ICD-10-CM | POA: Diagnosis not present

## 2022-03-11 DIAGNOSIS — Z13220 Encounter for screening for lipoid disorders: Secondary | ICD-10-CM | POA: Diagnosis not present

## 2022-03-11 DIAGNOSIS — N951 Menopausal and female climacteric states: Secondary | ICD-10-CM | POA: Diagnosis not present

## 2022-03-11 DIAGNOSIS — E538 Deficiency of other specified B group vitamins: Secondary | ICD-10-CM | POA: Diagnosis not present

## 2022-03-11 DIAGNOSIS — Z1329 Encounter for screening for other suspected endocrine disorder: Secondary | ICD-10-CM | POA: Diagnosis not present

## 2022-03-11 DIAGNOSIS — Z1159 Encounter for screening for other viral diseases: Secondary | ICD-10-CM | POA: Diagnosis not present

## 2022-03-11 DIAGNOSIS — E559 Vitamin D deficiency, unspecified: Secondary | ICD-10-CM | POA: Diagnosis not present

## 2022-03-11 DIAGNOSIS — Z0000 Encounter for general adult medical examination without abnormal findings: Secondary | ICD-10-CM | POA: Diagnosis not present

## 2022-03-11 DIAGNOSIS — Z131 Encounter for screening for diabetes mellitus: Secondary | ICD-10-CM | POA: Diagnosis not present

## 2022-03-11 DIAGNOSIS — R5383 Other fatigue: Secondary | ICD-10-CM | POA: Diagnosis not present

## 2022-03-17 DIAGNOSIS — K219 Gastro-esophageal reflux disease without esophagitis: Secondary | ICD-10-CM | POA: Diagnosis not present

## 2022-03-17 DIAGNOSIS — K2970 Gastritis, unspecified, without bleeding: Secondary | ICD-10-CM | POA: Diagnosis not present

## 2022-03-24 DIAGNOSIS — F339 Major depressive disorder, recurrent, unspecified: Secondary | ICD-10-CM | POA: Diagnosis not present

## 2022-03-24 DIAGNOSIS — I10 Essential (primary) hypertension: Secondary | ICD-10-CM | POA: Diagnosis not present

## 2022-03-24 DIAGNOSIS — F419 Anxiety disorder, unspecified: Secondary | ICD-10-CM | POA: Diagnosis not present

## 2022-03-24 DIAGNOSIS — Z1389 Encounter for screening for other disorder: Secondary | ICD-10-CM | POA: Diagnosis not present

## 2022-03-24 DIAGNOSIS — R69 Illness, unspecified: Secondary | ICD-10-CM | POA: Diagnosis not present

## 2022-03-24 DIAGNOSIS — E7849 Other hyperlipidemia: Secondary | ICD-10-CM | POA: Diagnosis not present

## 2022-03-24 DIAGNOSIS — Z6836 Body mass index (BMI) 36.0-36.9, adult: Secondary | ICD-10-CM | POA: Diagnosis not present

## 2022-03-24 DIAGNOSIS — I7090 Unspecified atherosclerosis: Secondary | ICD-10-CM | POA: Diagnosis not present

## 2022-03-24 DIAGNOSIS — E876 Hypokalemia: Secondary | ICD-10-CM | POA: Diagnosis not present

## 2022-03-25 DIAGNOSIS — F419 Anxiety disorder, unspecified: Secondary | ICD-10-CM | POA: Diagnosis not present

## 2022-03-25 DIAGNOSIS — F251 Schizoaffective disorder, depressive type: Secondary | ICD-10-CM | POA: Diagnosis not present

## 2022-03-25 DIAGNOSIS — R69 Illness, unspecified: Secondary | ICD-10-CM | POA: Diagnosis not present

## 2022-03-30 DIAGNOSIS — E876 Hypokalemia: Secondary | ICD-10-CM | POA: Diagnosis not present

## 2022-04-07 DIAGNOSIS — I10 Essential (primary) hypertension: Secondary | ICD-10-CM | POA: Diagnosis not present

## 2022-04-07 DIAGNOSIS — Z6837 Body mass index (BMI) 37.0-37.9, adult: Secondary | ICD-10-CM | POA: Diagnosis not present

## 2022-04-07 DIAGNOSIS — R69 Illness, unspecified: Secondary | ICD-10-CM | POA: Diagnosis not present

## 2022-04-07 DIAGNOSIS — Z719 Counseling, unspecified: Secondary | ICD-10-CM | POA: Diagnosis not present

## 2022-04-07 DIAGNOSIS — Z1389 Encounter for screening for other disorder: Secondary | ICD-10-CM | POA: Diagnosis not present

## 2022-04-07 DIAGNOSIS — I7090 Unspecified atherosclerosis: Secondary | ICD-10-CM | POA: Diagnosis not present

## 2022-04-07 DIAGNOSIS — E7849 Other hyperlipidemia: Secondary | ICD-10-CM | POA: Diagnosis not present

## 2022-04-08 DIAGNOSIS — I10 Essential (primary) hypertension: Secondary | ICD-10-CM | POA: Diagnosis not present

## 2022-04-08 DIAGNOSIS — R002 Palpitations: Secondary | ICD-10-CM | POA: Diagnosis not present

## 2022-04-08 DIAGNOSIS — R69 Illness, unspecified: Secondary | ICD-10-CM | POA: Diagnosis not present

## 2022-04-08 DIAGNOSIS — R0789 Other chest pain: Secondary | ICD-10-CM | POA: Diagnosis not present

## 2022-04-08 DIAGNOSIS — E7849 Other hyperlipidemia: Secondary | ICD-10-CM | POA: Diagnosis not present

## 2022-04-14 DIAGNOSIS — Z780 Asymptomatic menopausal state: Secondary | ICD-10-CM | POA: Diagnosis not present

## 2022-04-20 DIAGNOSIS — R1013 Epigastric pain: Secondary | ICD-10-CM | POA: Diagnosis not present

## 2022-04-20 DIAGNOSIS — K219 Gastro-esophageal reflux disease without esophagitis: Secondary | ICD-10-CM | POA: Diagnosis not present

## 2022-04-21 DIAGNOSIS — I10 Essential (primary) hypertension: Secondary | ICD-10-CM | POA: Diagnosis not present

## 2022-04-21 DIAGNOSIS — R69 Illness, unspecified: Secondary | ICD-10-CM | POA: Diagnosis not present

## 2022-04-21 DIAGNOSIS — Z1389 Encounter for screening for other disorder: Secondary | ICD-10-CM | POA: Diagnosis not present

## 2022-04-21 DIAGNOSIS — E7849 Other hyperlipidemia: Secondary | ICD-10-CM | POA: Diagnosis not present

## 2022-04-21 DIAGNOSIS — I7090 Unspecified atherosclerosis: Secondary | ICD-10-CM | POA: Diagnosis not present

## 2022-04-21 DIAGNOSIS — Z6838 Body mass index (BMI) 38.0-38.9, adult: Secondary | ICD-10-CM | POA: Diagnosis not present

## 2022-04-28 DIAGNOSIS — R69 Illness, unspecified: Secondary | ICD-10-CM | POA: Diagnosis not present

## 2022-04-28 DIAGNOSIS — F251 Schizoaffective disorder, depressive type: Secondary | ICD-10-CM | POA: Diagnosis not present

## 2022-04-28 DIAGNOSIS — F419 Anxiety disorder, unspecified: Secondary | ICD-10-CM | POA: Diagnosis not present

## 2022-05-19 DIAGNOSIS — R0789 Other chest pain: Secondary | ICD-10-CM | POA: Diagnosis not present

## 2022-06-08 DIAGNOSIS — I10 Essential (primary) hypertension: Secondary | ICD-10-CM | POA: Diagnosis not present

## 2022-06-10 DIAGNOSIS — R002 Palpitations: Secondary | ICD-10-CM | POA: Diagnosis not present

## 2022-06-10 DIAGNOSIS — E7849 Other hyperlipidemia: Secondary | ICD-10-CM | POA: Diagnosis not present

## 2022-06-10 DIAGNOSIS — I10 Essential (primary) hypertension: Secondary | ICD-10-CM | POA: Diagnosis not present

## 2022-06-10 DIAGNOSIS — R0789 Other chest pain: Secondary | ICD-10-CM | POA: Diagnosis not present

## 2022-06-10 DIAGNOSIS — R69 Illness, unspecified: Secondary | ICD-10-CM | POA: Diagnosis not present

## 2022-06-18 DIAGNOSIS — I10 Essential (primary) hypertension: Secondary | ICD-10-CM | POA: Diagnosis not present

## 2022-06-18 DIAGNOSIS — E785 Hyperlipidemia, unspecified: Secondary | ICD-10-CM | POA: Diagnosis not present

## 2022-06-19 DIAGNOSIS — F419 Anxiety disorder, unspecified: Secondary | ICD-10-CM | POA: Diagnosis not present

## 2022-06-19 DIAGNOSIS — F251 Schizoaffective disorder, depressive type: Secondary | ICD-10-CM | POA: Diagnosis not present

## 2022-06-19 DIAGNOSIS — I10 Essential (primary) hypertension: Secondary | ICD-10-CM | POA: Diagnosis not present

## 2022-06-19 DIAGNOSIS — R69 Illness, unspecified: Secondary | ICD-10-CM | POA: Diagnosis not present

## 2022-06-24 DIAGNOSIS — F411 Generalized anxiety disorder: Secondary | ICD-10-CM | POA: Diagnosis not present

## 2022-06-24 DIAGNOSIS — Z1389 Encounter for screening for other disorder: Secondary | ICD-10-CM | POA: Diagnosis not present

## 2022-06-24 DIAGNOSIS — Z6839 Body mass index (BMI) 39.0-39.9, adult: Secondary | ICD-10-CM | POA: Diagnosis not present

## 2022-06-24 DIAGNOSIS — E7849 Other hyperlipidemia: Secondary | ICD-10-CM | POA: Diagnosis not present

## 2022-06-24 DIAGNOSIS — I10 Essential (primary) hypertension: Secondary | ICD-10-CM | POA: Diagnosis not present

## 2022-06-24 DIAGNOSIS — Z1331 Encounter for screening for depression: Secondary | ICD-10-CM | POA: Diagnosis not present

## 2022-06-24 DIAGNOSIS — I7090 Unspecified atherosclerosis: Secondary | ICD-10-CM | POA: Diagnosis not present

## 2022-06-24 DIAGNOSIS — R69 Illness, unspecified: Secondary | ICD-10-CM | POA: Diagnosis not present

## 2022-06-24 DIAGNOSIS — Z0000 Encounter for general adult medical examination without abnormal findings: Secondary | ICD-10-CM | POA: Diagnosis not present

## 2022-07-19 DIAGNOSIS — I10 Essential (primary) hypertension: Secondary | ICD-10-CM | POA: Diagnosis not present

## 2022-07-19 DIAGNOSIS — E785 Hyperlipidemia, unspecified: Secondary | ICD-10-CM | POA: Diagnosis not present

## 2022-07-20 DIAGNOSIS — I10 Essential (primary) hypertension: Secondary | ICD-10-CM | POA: Diagnosis not present

## 2022-07-22 DIAGNOSIS — I10 Essential (primary) hypertension: Secondary | ICD-10-CM | POA: Diagnosis not present

## 2022-07-22 DIAGNOSIS — Z1389 Encounter for screening for other disorder: Secondary | ICD-10-CM | POA: Diagnosis not present

## 2022-07-22 DIAGNOSIS — I7090 Unspecified atherosclerosis: Secondary | ICD-10-CM | POA: Diagnosis not present

## 2022-07-22 DIAGNOSIS — E7849 Other hyperlipidemia: Secondary | ICD-10-CM | POA: Diagnosis not present

## 2022-07-22 DIAGNOSIS — F419 Anxiety disorder, unspecified: Secondary | ICD-10-CM | POA: Diagnosis not present

## 2022-07-22 DIAGNOSIS — Z6838 Body mass index (BMI) 38.0-38.9, adult: Secondary | ICD-10-CM | POA: Diagnosis not present

## 2022-07-22 DIAGNOSIS — R69 Illness, unspecified: Secondary | ICD-10-CM | POA: Diagnosis not present

## 2022-08-18 DIAGNOSIS — I10 Essential (primary) hypertension: Secondary | ICD-10-CM | POA: Diagnosis not present

## 2022-08-18 DIAGNOSIS — E785 Hyperlipidemia, unspecified: Secondary | ICD-10-CM | POA: Diagnosis not present

## 2022-08-19 DIAGNOSIS — I7090 Unspecified atherosclerosis: Secondary | ICD-10-CM | POA: Diagnosis not present

## 2022-08-19 DIAGNOSIS — Z1389 Encounter for screening for other disorder: Secondary | ICD-10-CM | POA: Diagnosis not present

## 2022-08-19 DIAGNOSIS — F419 Anxiety disorder, unspecified: Secondary | ICD-10-CM | POA: Diagnosis not present

## 2022-08-19 DIAGNOSIS — R69 Illness, unspecified: Secondary | ICD-10-CM | POA: Diagnosis not present

## 2022-08-19 DIAGNOSIS — I10 Essential (primary) hypertension: Secondary | ICD-10-CM | POA: Diagnosis not present

## 2022-08-19 DIAGNOSIS — Z6838 Body mass index (BMI) 38.0-38.9, adult: Secondary | ICD-10-CM | POA: Diagnosis not present

## 2022-08-19 DIAGNOSIS — F251 Schizoaffective disorder, depressive type: Secondary | ICD-10-CM | POA: Diagnosis not present

## 2022-08-19 DIAGNOSIS — E7849 Other hyperlipidemia: Secondary | ICD-10-CM | POA: Diagnosis not present

## 2022-08-20 DIAGNOSIS — I10 Essential (primary) hypertension: Secondary | ICD-10-CM | POA: Diagnosis not present

## 2022-09-10 DIAGNOSIS — M79674 Pain in right toe(s): Secondary | ICD-10-CM | POA: Diagnosis not present

## 2022-09-10 DIAGNOSIS — L600 Ingrowing nail: Secondary | ICD-10-CM | POA: Diagnosis not present

## 2022-09-10 DIAGNOSIS — M79671 Pain in right foot: Secondary | ICD-10-CM | POA: Diagnosis not present

## 2022-09-10 DIAGNOSIS — L03031 Cellulitis of right toe: Secondary | ICD-10-CM | POA: Diagnosis not present

## 2022-09-18 DIAGNOSIS — I10 Essential (primary) hypertension: Secondary | ICD-10-CM | POA: Diagnosis not present

## 2022-09-18 DIAGNOSIS — E785 Hyperlipidemia, unspecified: Secondary | ICD-10-CM | POA: Diagnosis not present

## 2022-09-22 DIAGNOSIS — E119 Type 2 diabetes mellitus without complications: Secondary | ICD-10-CM | POA: Diagnosis not present

## 2022-09-22 DIAGNOSIS — D509 Iron deficiency anemia, unspecified: Secondary | ICD-10-CM | POA: Diagnosis not present

## 2022-09-22 DIAGNOSIS — Z1331 Encounter for screening for depression: Secondary | ICD-10-CM | POA: Diagnosis not present

## 2022-09-22 DIAGNOSIS — E559 Vitamin D deficiency, unspecified: Secondary | ICD-10-CM | POA: Diagnosis not present

## 2022-09-22 DIAGNOSIS — I2699 Other pulmonary embolism without acute cor pulmonale: Secondary | ICD-10-CM | POA: Diagnosis not present

## 2022-09-22 DIAGNOSIS — E782 Mixed hyperlipidemia: Secondary | ICD-10-CM | POA: Diagnosis not present

## 2022-09-22 DIAGNOSIS — Z23 Encounter for immunization: Secondary | ICD-10-CM | POA: Diagnosis not present

## 2022-09-22 DIAGNOSIS — F39 Unspecified mood [affective] disorder: Secondary | ICD-10-CM | POA: Diagnosis not present

## 2022-09-22 DIAGNOSIS — F419 Anxiety disorder, unspecified: Secondary | ICD-10-CM | POA: Diagnosis not present

## 2022-09-22 DIAGNOSIS — Z6841 Body Mass Index (BMI) 40.0 and over, adult: Secondary | ICD-10-CM | POA: Diagnosis not present

## 2022-09-22 DIAGNOSIS — Z0001 Encounter for general adult medical examination with abnormal findings: Secondary | ICD-10-CM | POA: Diagnosis not present

## 2022-09-24 DIAGNOSIS — M79671 Pain in right foot: Secondary | ICD-10-CM | POA: Diagnosis not present

## 2022-09-24 DIAGNOSIS — L03031 Cellulitis of right toe: Secondary | ICD-10-CM | POA: Diagnosis not present

## 2022-09-24 DIAGNOSIS — M79674 Pain in right toe(s): Secondary | ICD-10-CM | POA: Diagnosis not present

## 2022-10-08 DIAGNOSIS — M79674 Pain in right toe(s): Secondary | ICD-10-CM | POA: Diagnosis not present

## 2022-10-08 DIAGNOSIS — L03031 Cellulitis of right toe: Secondary | ICD-10-CM | POA: Diagnosis not present

## 2022-10-08 DIAGNOSIS — M79671 Pain in right foot: Secondary | ICD-10-CM | POA: Diagnosis not present

## 2023-01-18 ENCOUNTER — Encounter: Payer: Self-pay | Admitting: Nurse Practitioner

## 2023-01-18 ENCOUNTER — Ambulatory Visit: Payer: Medicare HMO | Attending: Nurse Practitioner | Admitting: Nurse Practitioner

## 2023-01-18 VITALS — BP 116/70 | HR 90 | Ht 66.0 in | Wt 269.8 lb

## 2023-01-18 DIAGNOSIS — Z86711 Personal history of pulmonary embolism: Secondary | ICD-10-CM

## 2023-01-18 DIAGNOSIS — Z86718 Personal history of other venous thrombosis and embolism: Secondary | ICD-10-CM | POA: Diagnosis not present

## 2023-01-18 DIAGNOSIS — I10 Essential (primary) hypertension: Secondary | ICD-10-CM | POA: Diagnosis not present

## 2023-01-18 NOTE — Progress Notes (Unsigned)
Cardiology Office Note:    Date:  01/18/2023  ID:  Megan Oneal, DOB October 17, 1956, MRN [id number]  PCP:  Sharilyn Sites, [facility] Cardiologist:  Carlyle Dolly, MD     Referring MD: Sharilyn Sites, MD   CC: Here for follow-up  History of Present Illness:    Megan Oneal is a 67 y.o. female with a hx of the following:  History of PE/DVT History of PVCs Hypertension Type II diabetes Obesity  Patient is a pleasant 67 year old female with past medical history as mentioned above.   Previous cardiovascular history includes NST in 2016 that was negative for ischemia.  Echocardiogram at that time revealed normal EF, normal diastolic function.  Past cardiac monitor revealed PVCs without significant arrhythmias.   Last seen by Dr. Carlyle Dolly on November 25, 2020.  Her palpitations were controlled at the time.  Echocardiogram was obtained and revealed normal EF, grade 1 DD, mild LVH, mildly dilated left atrial size, no significant valvular abnormalities.  Was told to follow-up in 1 year.  Was hospitalized in April 2022 for psychosis that was felt to be related to acute toxic metabolic encephalopathy likely secondary to iatrogenic overdose of Flexeril.  EEG negative for focal seizures.   In May 2022, was admitted and treated for a saddle PE with right heart strain as well as acute bilateral leg DVT.  Was discharged on Eliquis.  Today she presents for overdue follow-up.  She is doing very well from a cardiac perspective. Denies any chest pain, shortness of breath, palpitations, syncope, presyncope, dizziness, orthopnea, PND, swelling or significant weight changes, acute bleeding, or claudication. She has recently started walking and will be starting to exercise on her exercise bike soon. Tolerating her medications well, compliant with meds. Denies any other questions or concerns.   SH: In her free time, she enjoys reading.   Past Medical History:  Diagnosis  Date   Anxiety 03/12/2013   Degenerative disc disease, lumbar    Epidural injections in 2007   Diabetes mellitus without complication ([place])    Endometriosis    Frequent PVCs    12,000/24 hours   Hypertension    Otalgia, unspecified    Palpitations 10/04/2010   Vascular bruit 01/2013   Right neck    Past Surgical History:  Procedure Laterality Date   BREAST SURGERY     benign tumor excised-age 45   CARDIOVASCULAR STRESS TEST  03/13/2008   No scintigraphic evidence of inducible myocardial ischemia, EKG negative for ischemia, no ECG changes.   COLONOSCOPY  2009   COLONOSCOPY N/A 09/21/2019   Procedure: COLONOSCOPY;  Surgeon: Rogene Houston, MD;  [facility];  Service: Endoscopy;  Laterality: N/A;  2:00-10:30   CYST EXCISION  2007   Scalp   DOPPLER ECHOCARDIOGRAPHY  03/13/2008   EF >97%, LV systolic function is normal, LV wall function is normal   LAPAROSCOPIC CHOLECYSTECTOMY  2010   LUMBAR LAMINECTOMY  2009    L4-L5 and L5-S1 laminectomy with diskectomy; transforaminal   POLYPECTOMY  09/21/2019   Procedure: POLYPECTOMY;  Surgeon: Rogene Houston, MD;  Location: [facility];  Service: Endoscopy;;   REPLACEMENT TOTAL KNEE     SLEEP STUDY  10/29/2009   AHI-1.8/hr, during REM 6.4/hr; RDI-8.0/hr, during REM 15.0/hr; avg oxygen during REM and NREM was 94%   TOTAL KNEE ARTHROPLASTY Bilateral    TUBAL LIGATION      Current Medications: Current Meds  Medication Sig   acetaminophen (  TYLENOL) 325 MG tablet Take 2 tablets (650 mg total) by mouth every 6 (six) hours as needed for mild pain (or Fever >/= 101).   aspirin EC 81 MG tablet Take 81 mg by mouth daily. Swallow whole.   famotidine (PEPCID) 20 MG tablet Take 20 mg by mouth daily.   fluticasone (FLONASE) 50 MCG/ACT nasal spray Place 2 sprays into both nostrils daily as needed for allergies.   furosemide (LASIX) 40 MG tablet Take 40 mg by mouth daily.   losartan (COZAAR) 100 MG tablet Take 100 mg by mouth daily.    mirtazapine (REMERON) 7.5 MG tablet Take 7.5 mg by mouth at bedtime.   OLANZapine (ZYPREXA) 20 MG tablet Take 20 mg by mouth daily.   pantoprazole (PROTONIX) 40 MG tablet Take 40 mg by mouth 2 (two) times daily.   rosuvastatin (CRESTOR) 5 MG tablet Take 5 mg by mouth daily.     Allergies:   Metoprolol and Sulfa antibiotics   Social History   Socioeconomic History   Marital status: Divorced    Spouse name: Not on file   Number of children: 3   Years of education: Not on file   Highest education level: Not on file  Occupational History   Not on file  Tobacco Use   Smoking status: Former    Packs/day: 2.00    Years: 20.00    Total pack years: 40.00    Types: Cigarettes    Start date: 08/03/1972    Quit date: 08/03/1996    Years since quitting: 26.4   Smokeless tobacco: Former    Quit date: 12/21/1994  Substance and Sexual Activity   Alcohol use: No   Drug use: No   Sexual activity: Not Currently    Birth control/protection: Post-menopausal  Other Topics Concern   Not on file  Social History Narrative   Lives home alone   Social Determinants of Health   Financial Resource Strain: Not on file  Food Insecurity: Not on file  Transportation Needs: Not on file  Physical Activity: Not on file  Stress: Not on file  Social Connections: Not on file     Family History: The patient's family history includes Diabetes in her brother and mother; Hypertension in her brother, father, and mother.  ROS:   Review of Systems  Constitutional: Negative.   HENT: Negative.    Eyes: Negative.   Respiratory: Negative.    Cardiovascular: Negative.   Gastrointestinal: Negative.   Genitourinary: Negative.   Musculoskeletal: Negative.   Skin: Negative.   Neurological: Negative.   Endo/Heme/Allergies: Negative.     Please see the history of present illness.    All other systems reviewed and are negative.  EKGs/Labs/Other Studies Reviewed:    The following studies were reviewed  today:   EKG:  EKG is ordered today.  The ekg ordered today demonstrates NSR, 91 bpm, no acute ischemic changes.   Echo limited on 04/22/2021:  1. Left ventricular ejection fraction, by estimation, is 65 to 70%. The  left ventricle has normal function.   2. The ventricular septum is flattened in systole consistent with RV  pressure overload. Relatively preserved motion of the RV anulus, RV TAPSE  and S' were not performed. The RV free wall appears hypokinetic. Overall  moderate RV dysfunction. . The right  ventricular size is moderately enlarged.   3. The pericardial effusion is circumferential. There is no evidence of  cardiac tamponade.   4. The aortic valve is tricuspid.  Echo complete  on 04/17/2021:  1. Left ventricular ejection fraction, by estimation, is 50 to 55%. The  left ventricle has low normal function. The left ventricle has no regional  wall motion abnormalities. There is mild left ventricular hypertrophy.  Left ventricular diastolic  parameters are indeterminate. There is the interventricular septum is  flattened in diastole ('D' shaped left ventricle), consistent with right  ventricular volume overload.   2. Right ventricular systolic function is moderately reduced. The right  ventricular size is moderately enlarged. There is upper normal pulmonary  artery systolic pressure. The estimated right ventricular systolic  pressure is 37.6 mmHg.   3. Right atrial size was mild to moderately dilated.   4. A small pericardial effusion is present. The pericardial effusion is  posterior to the left ventricle.   5. The mitral valve is grossly normal. Trivial mitral valve  regurgitation.   6. The aortic valve is tricuspid. Aortic valve regurgitation is not  visualized.   7. The inferior vena cava is dilated in size with >50% respiratory  variability, suggesting right atrial pressure of 8 mmHg.  Venous US lower bilateral extremities on 04/16/2021: IMPRESSION: 1. Positive for  extensive occlusive thrombus within the RIGHT lower extremity extending from the calf veins through the femoral vein and just into the common femoral vein at the bifurcation. 2. Positive for isolated acute occlusive calf vein DVT in the LEFT posterior tibial veins.  Recent Labs: No results found for requested labs within last 365 days.  Recent Lipid Panel    Component Value Date/Time   CHOL 186 02/16/2013 1353   TRIG 81 03/27/2021 0516      Physical Exam:    VS:  BP 116/70   Pulse 90   Ht '5\' 6"'$  (1.676 m)   Wt 269 lb 12.8 oz (122.4 kg)   SpO2 98%   BMI 43.55 kg/m     Wt Readings from Last 3 Encounters:  01/18/23 269 lb 12.8 oz (122.4 kg)  04/30/21 300 lb (136.1 kg)  04/25/21 298 lb 4.5 oz (135.3 kg)     GEN: Morbidly obese, 67 y.o. female in no acute distress HEENT: Normal NECK: No JVD; No carotid bruits CARDIAC: S1/S2, RRR, no murmurs, rubs, gallops; 2+ pulses RESPIRATORY:  Clear to auscultation without rales, wheezing or rhonchi  MUSCULOSKELETAL:  generalized nonpitting edema with lymphedema noted to bilateral lower extremities; No deformity  SKIN: Warm and dry NEUROLOGIC:  Alert and oriented x 3 PSYCHIATRIC:  Normal affect   ASSESSMENT:    1. Hypertension, unspecified type   2. History of pulmonary embolus (PE)   3. History of DVT (deep vein thrombosis)   4. Morbid obesity ([place])    PLAN:    In order of problems listed above:  History of PE/DVT's Denies any issues. States she has completed Eliquis therapy. She remains on Aspirin. Continue current medication regimen. Continue to follow with PCP. Heart healthy diet and regular cardiovascular exercise encouraged.   HTN BP well controlled. Continue Losartan. Discussed to monitor BP at home at least 2 hours after medications and sitting for 5-10 minutes. Heart healthy diet and regular cardiovascular exercise encouraged.   Morbid obesity BMI today 43.55. She is looking to lose weight and is going to begin an  exercise routine at home. Weight loss via diet and exercise encouraged. Discussed the impact being overweight would have on cardiovascular risk. She politely declines PREP program referral. Continue to follow with PCP. Heart healthy diet and regular cardiovascular exercise encouraged.  4. Disposition: Follow-up with Dr. Carlyle Dolly in 6 months or sooner if anything changes.   Medication Adjustments/Labs and Tests Ordered: Current medicines are reviewed at length with the patient today.  Concerns regarding medicines are outlined above.  Orders Placed This Encounter  Procedures   EKG 12-Lead   No orders of the defined types were placed in this encounter.   Patient Instructions  Medication Instructions:  Your physician recommends that you continue on your current medications as directed. Please refer to the Current Medication list given to you today.  Labwork: none  Testing/Procedures: none  Follow-Up: Your physician recommends that you schedule a follow-up appointment in: 6 months with Dr. Harl Bowie  Any Other Special Instructions Will Be Listed Below (If Applicable).  If you need a refill on your cardiac medications before your next appointment, please call your pharmacy.   SignedFinis Bud, NP  01/20/2023 11:44 AM    [place]

## 2023-01-18 NOTE — Patient Instructions (Signed)
Medication Instructions:  Your physician recommends that you continue on your current medications as directed. Please refer to the Current Medication list given to you today.  Labwork: none  Testing/Procedures: none  Follow-Up: Your physician recommends that you schedule a follow-up appointment in: 6 months with Dr. Harl Bowie  Any Other Special Instructions Will Be Listed Below (If Applicable).  If you need a refill on your cardiac medications before your next appointment, please call your pharmacy.

## 2023-03-21 DIAGNOSIS — E119 Type 2 diabetes mellitus without complications: Secondary | ICD-10-CM | POA: Diagnosis not present

## 2023-03-21 DIAGNOSIS — E782 Mixed hyperlipidemia: Secondary | ICD-10-CM | POA: Diagnosis not present

## 2023-03-22 DIAGNOSIS — Z6841 Body Mass Index (BMI) 40.0 and over, adult: Secondary | ICD-10-CM | POA: Diagnosis not present

## 2023-03-22 DIAGNOSIS — M1990 Unspecified osteoarthritis, unspecified site: Secondary | ICD-10-CM | POA: Diagnosis not present

## 2023-03-22 DIAGNOSIS — B370 Candidal stomatitis: Secondary | ICD-10-CM | POA: Diagnosis not present

## 2023-03-22 DIAGNOSIS — R7309 Other abnormal glucose: Secondary | ICD-10-CM | POA: Diagnosis not present

## 2023-03-22 DIAGNOSIS — M25552 Pain in left hip: Secondary | ICD-10-CM | POA: Diagnosis not present

## 2023-03-29 DIAGNOSIS — L03031 Cellulitis of right toe: Secondary | ICD-10-CM | POA: Diagnosis not present

## 2023-03-29 DIAGNOSIS — M79674 Pain in right toe(s): Secondary | ICD-10-CM | POA: Diagnosis not present

## 2023-03-29 DIAGNOSIS — M79671 Pain in right foot: Secondary | ICD-10-CM | POA: Diagnosis not present

## 2023-04-13 DIAGNOSIS — L03031 Cellulitis of right toe: Secondary | ICD-10-CM | POA: Diagnosis not present

## 2023-04-13 DIAGNOSIS — M79671 Pain in right foot: Secondary | ICD-10-CM | POA: Diagnosis not present

## 2023-04-13 DIAGNOSIS — M79674 Pain in right toe(s): Secondary | ICD-10-CM | POA: Diagnosis not present

## 2023-04-26 ENCOUNTER — Encounter (HOSPITAL_COMMUNITY): Payer: Self-pay

## 2023-05-20 ENCOUNTER — Other Ambulatory Visit: Payer: Self-pay | Admitting: Family Medicine

## 2023-05-20 DIAGNOSIS — Z1231 Encounter for screening mammogram for malignant neoplasm of breast: Secondary | ICD-10-CM

## 2023-05-21 ENCOUNTER — Ambulatory Visit
Admission: RE | Admit: 2023-05-21 | Discharge: 2023-05-21 | Disposition: A | Discharge status: Home / Self Care | Payer: Medicare HMO | Source: Ambulatory Visit | Attending: Family Medicine | Admitting: Family Medicine

## 2023-05-21 DIAGNOSIS — Z1231 Encounter for screening mammogram for malignant neoplasm of breast: Secondary | ICD-10-CM

## 2023-07-28 ENCOUNTER — Encounter: Payer: Self-pay | Admitting: Cardiology

## 2023-07-28 ENCOUNTER — Ambulatory Visit: Payer: Medicare HMO | Attending: Cardiology | Admitting: Cardiology

## 2023-07-28 VITALS — BP 132/72 | HR 72 | Ht 67.0 in | Wt 272.8 lb

## 2023-07-28 DIAGNOSIS — Z86711 Personal history of pulmonary embolism: Secondary | ICD-10-CM

## 2023-07-28 DIAGNOSIS — R002 Palpitations: Secondary | ICD-10-CM | POA: Diagnosis not present

## 2023-07-28 DIAGNOSIS — I10 Essential (primary) hypertension: Secondary | ICD-10-CM

## 2023-07-28 NOTE — Progress Notes (Signed)
Clinical Summary Megan Oneal is a 66 y.o.female seen today for follow up of the following medical problems.      1. Chest pain - 2016 nuclear stress no ischemia - echo 2016 LVEF >55%, normal diastolic function   - no recent chest pain. s     2. Palpitations - most recent monitor with PVCs, no arrhythmias - previous notes mention a history of PSVT, details are unclear.    -  Side effects on lopressor, reports prn coreg did not work.     - no recent palpitations.    3. OSA screen - she reports sleep study  in 2010      4. HTN - compliant with meds    5. PE - admit 03/2021 with PE, saddle PE with RV strain -03/2021 echo Dshaped septum - from notes thought related to recent admission with critical illness and premarin use  - don't see where she has had repeat echo since her PE to reevaluate RV. Prior echos 2021 showed normal RV function and size     Past Medical History:  Diagnosis Date   Anxiety 03/12/2013   Degenerative disc disease, lumbar    Epidural injections in 2007   Diabetes mellitus without complication ([facility])    Endometriosis    Frequent PVCs    12,000/24 hours   Hypertension    Otalgia, unspecified    Palpitations 10/04/2010   Vascular bruit 01/2013   Right neck     Allergies  Allergen Reactions   Metoprolol Other (See Comments)    Fatigue and malaise; wheezing   Sulfa Antibiotics      Current Outpatient Medications  Medication Sig Dispense Refill   acetaminophen (TYLENOL) 325 MG tablet Take 2 tablets (650 mg total) by mouth every 6 (six) hours as needed for mild pain (or Fever >/= 101).     aspirin EC 81 MG tablet Take 81 mg by mouth daily. Swallow whole.     famotidine (PEPCID) 20 MG tablet Take 20 mg by mouth daily.     fluticasone (FLONASE) 50 MCG/ACT nasal spray Place 2 sprays into both nostrils daily as needed for allergies.     furosemide (LASIX) 40 MG tablet Take 40 mg by mouth daily.     losartan (COZAAR) 100 MG tablet Take 100  mg by mouth daily.     mirtazapine (REMERON) 7.5 MG tablet Take 7.5 mg by mouth at bedtime.     OLANZapine (ZYPREXA) 20 MG tablet Take 20 mg by mouth daily.     pantoprazole (PROTONIX) 40 MG tablet Take 40 mg by mouth 2 (two) times daily.     rosuvastatin (CRESTOR) 5 MG tablet Take 5 mg by mouth daily.     No current facility-administered medications for this visit.     Past Surgical History:  Procedure Laterality Date   BREAST SURGERY     benign tumor excised-age 61   CARDIOVASCULAR STRESS TEST  03/13/2008   No scintigraphic evidence of inducible myocardial ischemia, EKG negative for ischemia, no ECG changes.   COLONOSCOPY  2009   COLONOSCOPY N/A 09/21/2019   Procedure: COLONOSCOPY;  Surgeon: Malissa Hippo, MD;  [facility];  Service: Endoscopy;  Laterality: N/A;  2:00-10:30   CYST EXCISION  2007   Scalp   DOPPLER ECHOCARDIOGRAPHY  03/13/2008   EF >55%, LV systolic function is normal, LV wall function is normal   LAPAROSCOPIC CHOLECYSTECTOMY  2010   LUMBAR LAMINECTOMY  2009  L4-L5 and L5-S1 laminectomy with diskectomy; transforaminal   POLYPECTOMY  09/21/2019   Procedure: POLYPECTOMY;  Surgeon: Malissa Hippo, MD;  [facility];  Service: Endoscopy;;   REPLACEMENT TOTAL KNEE     SLEEP STUDY  10/29/2009   AHI-1.8/hr, during REM 6.4/hr; RDI-8.0/hr, during REM 15.0/hr; avg oxygen during REM and NREM was 94%   TOTAL KNEE ARTHROPLASTY Bilateral    TUBAL LIGATION       Allergies  Allergen Reactions   Metoprolol Other (See Comments)    Fatigue and malaise; wheezing   Sulfa Antibiotics       Family History  Problem Relation Age of Onset   Diabetes Mother    Hypertension Mother    Hypertension Father    Diabetes Brother    Hypertension Brother    Breast cancer Neg Hx      Social History Megan Oneal reports that she quit smoking about 27 years ago. Her smoking use included cigarettes. She started smoking about 51 years ago. She has a 48 pack-year  smoking history. She quit smokeless tobacco use about 28 years ago. Megan Oneal reports no history of alcohol use.   Review of Systems CONSTITUTIONAL: No weight loss, fever, chills, weakness or fatigue.  HEENT: Eyes: No visual loss, blurred vision, double vision or yellow sclerae.No hearing loss, sneezing, congestion, runny nose or sore throat.  SKIN: No rash or itching.  CARDIOVASCULAR: per hpi RESPIRATORY: No shortness of breath, cough or sputum.  GASTROINTESTINAL: No anorexia, nausea, vomiting or diarrhea. No abdominal pain or blood.  GENITOURINARY: No burning on urination, no polyuria NEUROLOGICAL: No headache, dizziness, syncope, paralysis, ataxia, numbness or tingling in the extremities. No change in bowel or bladder control.  MUSCULOSKELETAL: No muscle, back pain, joint pain or stiffness.  LYMPHATICS: No enlarged nodes. No history of splenectomy.  PSYCHIATRIC: No history of depression or anxiety.  ENDOCRINOLOGIC: No reports of sweating, cold or heat intolerance. No polyuria or polydipsia.  Marland Kitchen   Physical Examination Today's Vitals   07/28/23 1521  BP: 132/72  Pulse: 72  SpO2: 98%  Weight: 272 lb 12.8 oz (123.7 kg)  Height: 5\' 7"  (1.702 m)   Body mass index is 42.73 kg/m.  Gen: resting comfortably, no acute distress HEENT: no scleral icterus, pupils equal round and reactive, no palptable cervical adenopathy,  CV: RRR, no mrg, no jvd Resp: Clear to auscultation bilaterally GI: abdomen is soft, non-tender, non-distended, normal bowel sounds, no hepatosplenomegaly MSK: extremities are warm, no edema.  Skin: warm, no rash Neuro:  no focal deficits Psych: appropriate affect      Assessment and Plan   1. Palpitations - She reports some side effects to lopressor prevoiusly.prn coreg did not help - had been on verapamil at one point, uncler when stopped - no recent symptoms despite not being on any av nodal agents, monitor at this time.     2. HTN - at goal, continue  current meds  3. History of PE/RV strain - managed by other providers, appears provoked PE and she has completed anticoag course - would repeat echo as never had one post PE to confirm RV strain has resolved.   F/u 1 year   Antoine Poche, M.D.

## 2023-07-28 NOTE — Patient Instructions (Signed)
Medication Instructions:   Continue all current medications.    Labwork:  none  Testing/Procedures:  Your physician has requested that you have an echocardiogram. Echocardiography is a painless test that uses sound waves to create images of your heart. It provides your doctor with information about the size and shape of your heart and how well your heart's chambers and valves are working. This procedure takes approximately one hour. There are no restrictions for this procedure. Please do NOT wear cologne, perfume, aftershave, or lotions (deodorant is allowed). Please arrive 15 minutes prior to your appointment time. Office will contact with results via phone, letter or mychart.     Follow-Up:  1 year   Any Other Special Instructions Will Be Listed Below (If Applicable).   If you need a refill on your cardiac medications before your next appointment, please call your pharmacy.

## 2023-08-12 ENCOUNTER — Ambulatory Visit: Payer: Medicare HMO | Attending: Cardiology

## 2023-08-12 DIAGNOSIS — I5030 Unspecified diastolic (congestive) heart failure: Secondary | ICD-10-CM | POA: Diagnosis not present

## 2023-08-12 DIAGNOSIS — I088 Other rheumatic multiple valve diseases: Secondary | ICD-10-CM

## 2023-08-12 DIAGNOSIS — I517 Cardiomegaly: Secondary | ICD-10-CM

## 2023-08-12 DIAGNOSIS — Z86711 Personal history of pulmonary embolism: Secondary | ICD-10-CM | POA: Diagnosis not present

## 2023-08-13 LAB — ECHOCARDIOGRAM COMPLETE
AR max vel: 2.43 cm2
AV Area VTI: 2.39 cm2
AV Area mean vel: 2.43 cm2
AV Mean grad: 5 mmHg
AV Peak grad: 8.8 mmHg
Ao pk vel: 1.48 m/s
Area-P 1/2: 2.96 cm2
Calc EF: 66.6 %
MV VTI: 2.87 cm2
S' Lateral: 2.6 cm
Single Plane A2C EF: 71.8 %
Single Plane A4C EF: 62.8 %

## 2023-09-28 ENCOUNTER — Other Ambulatory Visit: Payer: Self-pay | Admitting: Obstetrics and Gynecology

## 2023-09-29 ENCOUNTER — Telehealth: Payer: Self-pay | Admitting: *Deleted

## 2023-09-29 NOTE — Telephone Encounter (Signed)
-----   Message from Dina Rich sent at 09/14/2023  1:28 PM EDT ----- Echo looks good, normal heart function.  Dominga Ferry MD

## 2023-09-29 NOTE — Telephone Encounter (Signed)
Lesle Chris, LPN 16/12/958 45:40 AM EDT Back to Top    Notified, copy to pcp.

## 2023-10-08 DIAGNOSIS — I10 Essential (primary) hypertension: Secondary | ICD-10-CM | POA: Diagnosis not present

## 2023-10-08 DIAGNOSIS — Z0001 Encounter for general adult medical examination with abnormal findings: Secondary | ICD-10-CM | POA: Diagnosis not present

## 2023-10-08 DIAGNOSIS — E782 Mixed hyperlipidemia: Secondary | ICD-10-CM | POA: Diagnosis not present

## 2023-10-08 DIAGNOSIS — E119 Type 2 diabetes mellitus without complications: Secondary | ICD-10-CM | POA: Diagnosis not present

## 2023-12-06 DIAGNOSIS — R42 Dizziness and giddiness: Secondary | ICD-10-CM | POA: Diagnosis not present

## 2023-12-06 DIAGNOSIS — R531 Weakness: Secondary | ICD-10-CM | POA: Diagnosis not present

## 2024-05-12 ENCOUNTER — Encounter (HOSPITAL_COMMUNITY): Payer: Self-pay | Admitting: Emergency Medicine

## 2024-05-12 ENCOUNTER — Emergency Department (HOSPITAL_COMMUNITY)
Admission: EM | Admit: 2024-05-12 | Discharge: 2024-05-12 | Disposition: A | Discharge status: Home / Self Care | Attending: Emergency Medicine | Admitting: Emergency Medicine

## 2024-05-12 ENCOUNTER — Emergency Department (HOSPITAL_COMMUNITY)

## 2024-05-12 ENCOUNTER — Other Ambulatory Visit: Payer: Self-pay

## 2024-05-12 DIAGNOSIS — U071 COVID-19: Secondary | ICD-10-CM | POA: Diagnosis not present

## 2024-05-12 DIAGNOSIS — S32019A Unspecified fracture of first lumbar vertebra, initial encounter for closed fracture: Secondary | ICD-10-CM | POA: Insufficient documentation

## 2024-05-12 DIAGNOSIS — I959 Hypotension, unspecified: Secondary | ICD-10-CM | POA: Diagnosis not present

## 2024-05-12 DIAGNOSIS — Y92410 Unspecified street and highway as the place of occurrence of the external cause: Secondary | ICD-10-CM | POA: Diagnosis not present

## 2024-05-12 DIAGNOSIS — S32010A Wedge compression fracture of first lumbar vertebra, initial encounter for closed fracture: Secondary | ICD-10-CM | POA: Diagnosis not present

## 2024-05-12 DIAGNOSIS — Z7982 Long term (current) use of aspirin: Secondary | ICD-10-CM | POA: Diagnosis not present

## 2024-05-12 DIAGNOSIS — R6889 Other general symptoms and signs: Secondary | ICD-10-CM | POA: Diagnosis not present

## 2024-05-12 DIAGNOSIS — M549 Dorsalgia, unspecified: Secondary | ICD-10-CM | POA: Diagnosis not present

## 2024-05-12 DIAGNOSIS — M47816 Spondylosis without myelopathy or radiculopathy, lumbar region: Secondary | ICD-10-CM | POA: Diagnosis not present

## 2024-05-12 DIAGNOSIS — M546 Pain in thoracic spine: Secondary | ICD-10-CM | POA: Diagnosis not present

## 2024-05-12 DIAGNOSIS — Z6841 Body Mass Index (BMI) 40.0 and over, adult: Secondary | ICD-10-CM | POA: Diagnosis not present

## 2024-05-12 DIAGNOSIS — Z4789 Encounter for other orthopedic aftercare: Secondary | ICD-10-CM | POA: Diagnosis not present

## 2024-05-12 DIAGNOSIS — S3992XA Unspecified injury of lower back, initial encounter: Secondary | ICD-10-CM | POA: Diagnosis not present

## 2024-05-12 DIAGNOSIS — M545 Low back pain, unspecified: Secondary | ICD-10-CM | POA: Diagnosis not present

## 2024-05-12 MED ORDER — ACETAMINOPHEN 325 MG PO TABS
650.0000 mg | ORAL_TABLET | Freq: Once | ORAL | Status: AC
Start: 2024-05-12 — End: 2024-05-12
  Administered 2024-05-12: 650 mg via ORAL
  Filled 2024-05-12: qty 2

## 2024-05-12 MED ORDER — HYDROCODONE-ACETAMINOPHEN 5-325 MG PO TABS: 1.0000 | ORAL_TABLET | Freq: Four times a day (QID) | ORAL | 0 refills | Status: AC | PRN
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
## 2024-05-12 NOTE — Discharge Instructions (Signed)
 Please wear the TLSO brace as directed at the bedside.  Please call to make an appointment with neurosurgery on outpatient basis for close follow-up.  Return to the emergency department immediately for any new or worsening symptoms.

## 2024-05-12 NOTE — ED Notes (Signed)
Pt teaching provided on medications that may cause drowsiness. Pt instructed not to drive or operate heavy machinery while taking the prescribed medication. Pt verbalized understanding.   Pt provided discharge instructions and prescription information. Pt was given the opportunity to ask questions and questions were answered.   

## 2024-05-18 ENCOUNTER — Other Ambulatory Visit: Payer: Self-pay | Admitting: *Deleted

## 2024-05-18 DIAGNOSIS — Z1231 Encounter for screening mammogram for malignant neoplasm of breast: Secondary | ICD-10-CM

## 2024-05-24 ENCOUNTER — Inpatient Hospital Stay: Admission: RE | Admit: 2024-05-24 | Source: Ambulatory Visit

## 2024-05-24 DIAGNOSIS — S32028A Other fracture of second lumbar vertebra, initial encounter for closed fracture: Secondary | ICD-10-CM | POA: Diagnosis not present

## 2024-05-24 DIAGNOSIS — Z6841 Body Mass Index (BMI) 40.0 and over, adult: Secondary | ICD-10-CM | POA: Diagnosis not present

## 2024-07-05 DIAGNOSIS — S32028A Other fracture of second lumbar vertebra, initial encounter for closed fracture: Secondary | ICD-10-CM | POA: Diagnosis not present

## 2024-07-05 DIAGNOSIS — S32010G Wedge compression fracture of first lumbar vertebra, subsequent encounter for fracture with delayed healing: Secondary | ICD-10-CM | POA: Diagnosis not present

## 2024-07-05 DIAGNOSIS — Z6841 Body Mass Index (BMI) 40.0 and over, adult: Secondary | ICD-10-CM | POA: Diagnosis not present

## 2024-07-06 ENCOUNTER — Other Ambulatory Visit (HOSPITAL_COMMUNITY): Payer: Self-pay | Admitting: Family Medicine

## 2024-07-06 DIAGNOSIS — E2839 Other primary ovarian failure: Secondary | ICD-10-CM

## 2024-07-13 ENCOUNTER — Ambulatory Visit (HOSPITAL_COMMUNITY)

## 2024-07-13 ENCOUNTER — Other Ambulatory Visit (HOSPITAL_COMMUNITY)

## 2024-08-07 ENCOUNTER — Ambulatory Visit (HOSPITAL_COMMUNITY)
Admission: RE | Admit: 2024-08-07 | Discharge: 2024-08-07 | Disposition: A | Discharge status: Home / Self Care | Source: Ambulatory Visit | Attending: Family Medicine | Admitting: Family Medicine

## 2024-08-07 DIAGNOSIS — Z1231 Encounter for screening mammogram for malignant neoplasm of breast: Secondary | ICD-10-CM | POA: Insufficient documentation

## 2024-08-07 DIAGNOSIS — E2839 Other primary ovarian failure: Secondary | ICD-10-CM | POA: Insufficient documentation

## 2024-08-17 DIAGNOSIS — I10 Essential (primary) hypertension: Secondary | ICD-10-CM | POA: Diagnosis not present

## 2024-08-17 DIAGNOSIS — M1990 Unspecified osteoarthritis, unspecified site: Secondary | ICD-10-CM | POA: Diagnosis not present

## 2024-08-17 DIAGNOSIS — Z6841 Body Mass Index (BMI) 40.0 and over, adult: Secondary | ICD-10-CM | POA: Diagnosis not present

## 2024-08-17 DIAGNOSIS — E6609 Other obesity due to excess calories: Secondary | ICD-10-CM | POA: Diagnosis not present

## 2024-08-17 DIAGNOSIS — E119 Type 2 diabetes mellitus without complications: Secondary | ICD-10-CM | POA: Diagnosis not present

## 2024-08-17 DIAGNOSIS — Z0001 Encounter for general adult medical examination with abnormal findings: Secondary | ICD-10-CM | POA: Diagnosis not present

## 2024-08-17 DIAGNOSIS — E559 Vitamin D deficiency, unspecified: Secondary | ICD-10-CM | POA: Diagnosis not present

## 2024-08-17 DIAGNOSIS — E538 Deficiency of other specified B group vitamins: Secondary | ICD-10-CM | POA: Diagnosis not present

## 2024-08-17 DIAGNOSIS — D509 Iron deficiency anemia, unspecified: Secondary | ICD-10-CM | POA: Diagnosis not present

## 2024-09-18 DIAGNOSIS — M79675 Pain in left toe(s): Secondary | ICD-10-CM | POA: Diagnosis not present

## 2024-09-18 DIAGNOSIS — M79674 Pain in right toe(s): Secondary | ICD-10-CM | POA: Diagnosis not present

## 2024-09-18 DIAGNOSIS — M79672 Pain in left foot: Secondary | ICD-10-CM | POA: Diagnosis not present

## 2024-09-18 DIAGNOSIS — M79671 Pain in right foot: Secondary | ICD-10-CM | POA: Diagnosis not present

## 2024-09-18 DIAGNOSIS — I739 Peripheral vascular disease, unspecified: Secondary | ICD-10-CM | POA: Diagnosis not present

## 2024-09-18 DIAGNOSIS — L609 Nail disorder, unspecified: Secondary | ICD-10-CM | POA: Diagnosis not present

## 2024-10-05 ENCOUNTER — Encounter: Payer: Self-pay | Admitting: Cardiology

## 2024-10-05 ENCOUNTER — Ambulatory Visit: Attending: Cardiology | Admitting: Cardiology

## 2024-10-05 VITALS — BP 134/86 | HR 87 | Ht 66.0 in | Wt 259.0 lb

## 2024-10-05 DIAGNOSIS — I10 Essential (primary) hypertension: Secondary | ICD-10-CM | POA: Diagnosis not present

## 2024-10-05 DIAGNOSIS — R002 Palpitations: Secondary | ICD-10-CM | POA: Diagnosis not present

## 2024-10-05 NOTE — Patient Instructions (Signed)

## 2024-10-05 NOTE — Progress Notes (Signed)
 Clinical Summary Megan Oneal is a 68 y.o.female seen today for follow up of the following medical problems.        1. Chest pain - 2016 nuclear stress no ischemia - echo 2016 LVEF >55%, normal diastolic function   -no recent chest pains.      2. Palpitations - most recent monitor with PVCs, no arrhythmias - previous notes mention a history of PSVT, details are unclear.   -  Side effects on lopressor , reports prn coreg  did not work.     -denies any recent palpitations. Has not recently been on av nodal agents.     3. HTN - compliant with meds -        4. PE - admit 03/2021 with PE, saddle PE with RV strain -03/2021 echo Dshaped septum - from notes thought related to recent admission with critical illness and premarin  use, managed by other providers, no longer on anticoag  07/2023 echo LVEF 60-65% normal RV function Past Medical History:  Diagnosis Date   Anxiety 03/12/2013   Degenerative disc disease, lumbar    Epidural injections in 2007   Diabetes mellitus without complication (HCC)    Endometriosis    Frequent PVCs    12,000/24 hours   Hypertension    Otalgia, unspecified    Palpitations 10/04/2010   Vascular bruit 01/2013   Right neck     Allergies  Allergen Reactions   Metoprolol  Other (See Comments)    Fatigue and malaise; wheezing   Sulfa Antibiotics      Current Outpatient Medications  Medication Sig Dispense Refill   acetaminophen  (TYLENOL ) 325 MG tablet Take 2 tablets (650 mg total) by mouth every 6 (six) hours as needed for mild pain (or Fever >/= 101).     aspirin  EC 81 MG tablet Take 81 mg by mouth daily. Swallow whole.     famotidine  (PEPCID ) 20 MG tablet Take 20 mg by mouth daily.     fluticasone  (FLONASE ) 50 MCG/ACT nasal spray Place 2 sprays into both nostrils daily as needed for allergies.     furosemide  (LASIX ) 40 MG tablet Take 40 mg by mouth daily.     HYDROcodone -acetaminophen  (NORCO/VICODIN) 5-325 MG tablet Take 1 tablet by mouth  every 6 (six) hours as needed for severe pain (pain score 7-10). 10 tablet 0   losartan  (COZAAR ) 100 MG tablet Take 100 mg by mouth daily.     mirtazapine (REMERON) 7.5 MG tablet Take 7.5 mg by mouth at bedtime.     OLANZapine  (ZYPREXA ) 20 MG tablet Take 20 mg by mouth daily.     pantoprazole  (PROTONIX ) 40 MG tablet Take 40 mg by mouth 2 (two) times daily.     rosuvastatin  (CRESTOR ) 5 MG tablet Take 5 mg by mouth daily.     No current facility-administered medications for this visit.     Past Surgical History:  Procedure Laterality Date   BREAST SURGERY     benign tumor excised-age 60   CARDIOVASCULAR STRESS TEST  03/13/2008   No scintigraphic evidence of inducible myocardial ischemia, EKG negative for ischemia, no ECG changes.   COLONOSCOPY  2009   COLONOSCOPY N/A 09/21/2019   Procedure: COLONOSCOPY;  Surgeon: Golda Claudis PENNER, MD;  Location: [facility];  Service: Endoscopy;  Laterality: N/A;  2:00-10:30   CYST EXCISION  2007   Scalp   DOPPLER ECHOCARDIOGRAPHY  03/13/2008   EF >55%, LV systolic function is normal, LV wall function is normal  LAPAROSCOPIC CHOLECYSTECTOMY  2010   LUMBAR LAMINECTOMY  2009    L4-L5 and L5-S1 laminectomy with diskectomy; transforaminal   POLYPECTOMY  09/21/2019   Procedure: POLYPECTOMY;  Surgeon: Golda Claudis PENNER, MD;  Location: [facility];  Service: Endoscopy;;   REPLACEMENT TOTAL KNEE     SLEEP STUDY  10/29/2009   AHI-1.8/hr, during REM 6.4/hr; RDI-8.0/hr, during REM 15.0/hr; avg oxygen during REM and NREM was 94%   TOTAL KNEE ARTHROPLASTY Bilateral    TUBAL LIGATION       Allergies  Allergen Reactions   Metoprolol  Other (See Comments)    Fatigue and malaise; wheezing   Sulfa Antibiotics       Family History  Problem Relation Age of Onset   Diabetes Mother    Hypertension Mother    Hypertension Father    Diabetes Brother    Hypertension Brother    Breast cancer Neg Hx      Social History Megan Oneal reports that she quit smoking  about 28 years ago. Her smoking use included cigarettes. She started smoking about 52 years ago. She has a 48 pack-year smoking history. She quit smokeless tobacco use about 29 years ago. Megan Oneal reports no history of alcohol  use.   Physical Examination Today's Vitals   10/05/24 1340  BP: 134/86  Pulse: 87  SpO2: 97%  Weight: 259 lb (117.5 kg)  Height: 5' 6 (1.676 m)   Body mass index is 41.8 kg/m.  Gen: resting comfortably, no acute distress HEENT: no scleral icterus, pupils equal round and reactive, no palptable cervical adenopathy,  CV: RRR, no mrg, no jvd Resp: Clear to auscultation bilaterally GI: abdomen is soft, non-tender, non-distended, normal bowel sounds, no hepatosplenomegaly MSK: extremities are warm, no edema.  Skin: warm, no rash Neuro:  no focal deficits Psych: appropriate affect   Diagnostic Studies  07/2023 echo 1. Left ventricular ejection fraction, by estimation, is 60 to 65%. The  left ventricle has normal function. The left ventricle has no regional  wall motion abnormalities. There is mild left ventricular hypertrophy.  Left ventricular diastolic parameters  are consistent with Grade I diastolic dysfunction (impaired relaxation).   2. Right ventricular systolic function is normal. The right ventricular  size is mildly enlarged. Tricuspid regurgitation signal is inadequate for  assessing PA pressure.   3. The mitral valve is normal in structure. Trivial mitral valve  regurgitation. No evidence of mitral stenosis.   4. The aortic valve is tricuspid. Aortic valve regurgitation is not  visualized. No aortic stenosis is present.   5. The inferior vena cava is normal in size with greater than 50%  respiratory variability, suggesting right atrial pressure of 3 mmHg.    Assessment and Plan   1. Palpitations - no recent symptoms, most recently has not been on av nodal agents - continue to monitor     2. HTN - at goal, continue current meds         Megan Oneal, M.D.,

## 2024-10-06 DIAGNOSIS — Z6841 Body Mass Index (BMI) 40.0 and over, adult: Secondary | ICD-10-CM | POA: Diagnosis not present

## 2024-10-06 DIAGNOSIS — S32010G Wedge compression fracture of first lumbar vertebra, subsequent encounter for fracture with delayed healing: Secondary | ICD-10-CM | POA: Diagnosis not present

## 2024-10-09 ENCOUNTER — Encounter: Payer: Self-pay | Admitting: Neurosurgery

## 2024-10-09 ENCOUNTER — Other Ambulatory Visit: Payer: Self-pay | Admitting: Neurosurgery

## 2024-10-09 DIAGNOSIS — S32010G Wedge compression fracture of first lumbar vertebra, subsequent encounter for fracture with delayed healing: Secondary | ICD-10-CM

## 2024-11-06 DIAGNOSIS — I739 Peripheral vascular disease, unspecified: Secondary | ICD-10-CM | POA: Diagnosis not present

## 2024-11-06 DIAGNOSIS — M79671 Pain in right foot: Secondary | ICD-10-CM | POA: Diagnosis not present

## 2024-11-06 DIAGNOSIS — L110 Acquired keratosis follicularis: Secondary | ICD-10-CM | POA: Diagnosis not present

## 2024-11-06 DIAGNOSIS — M79672 Pain in left foot: Secondary | ICD-10-CM | POA: Diagnosis not present

## 2024-11-06 DIAGNOSIS — M79674 Pain in right toe(s): Secondary | ICD-10-CM | POA: Diagnosis not present

## 2024-11-06 DIAGNOSIS — M79675 Pain in left toe(s): Secondary | ICD-10-CM | POA: Diagnosis not present

## 2024-11-09 ENCOUNTER — Ambulatory Visit
Admission: RE | Admit: 2024-11-09 | Discharge: 2024-11-09 | Disposition: A | Discharge status: Home / Self Care | Source: Ambulatory Visit | Attending: Neurosurgery | Admitting: Neurosurgery

## 2024-11-09 DIAGNOSIS — M48061 Spinal stenosis, lumbar region without neurogenic claudication: Secondary | ICD-10-CM | POA: Diagnosis not present

## 2024-11-09 DIAGNOSIS — M47816 Spondylosis without myelopathy or radiculopathy, lumbar region: Secondary | ICD-10-CM | POA: Diagnosis not present

## 2024-11-09 DIAGNOSIS — M5126 Other intervertebral disc displacement, lumbar region: Secondary | ICD-10-CM | POA: Diagnosis not present

## 2024-11-09 DIAGNOSIS — S32010G Wedge compression fracture of first lumbar vertebra, subsequent encounter for fracture with delayed healing: Secondary | ICD-10-CM
